# Patient Record
Sex: Female | Born: 1955 | Race: White | Hispanic: No | Smoking: Former smoker
Health system: Southern US, Community
[De-identification: ages and names within clinical notes are randomized; demographics above are authoritative.]

## PROBLEM LIST (undated history)

## (undated) DIAGNOSIS — S2232XA Fracture of one rib, left side, initial encounter for closed fracture: Secondary | ICD-10-CM

## (undated) DIAGNOSIS — F410 Panic disorder [episodic paroxysmal anxiety] without agoraphobia: Secondary | ICD-10-CM

## (undated) DIAGNOSIS — G2581 Restless legs syndrome: Secondary | ICD-10-CM

## (undated) DIAGNOSIS — G4700 Insomnia, unspecified: Secondary | ICD-10-CM

## (undated) DIAGNOSIS — E538 Deficiency of other specified B group vitamins: Secondary | ICD-10-CM

## (undated) DIAGNOSIS — R55 Syncope and collapse: Secondary | ICD-10-CM

## (undated) DIAGNOSIS — M8580 Other specified disorders of bone density and structure, unspecified site: Secondary | ICD-10-CM

## (undated) DIAGNOSIS — F329 Major depressive disorder, single episode, unspecified: Secondary | ICD-10-CM

## (undated) DIAGNOSIS — R5382 Chronic fatigue, unspecified: Secondary | ICD-10-CM

## (undated) DIAGNOSIS — E782 Mixed hyperlipidemia: Secondary | ICD-10-CM

## (undated) DIAGNOSIS — M1990 Unspecified osteoarthritis, unspecified site: Secondary | ICD-10-CM

## (undated) DIAGNOSIS — F319 Bipolar disorder, unspecified: Secondary | ICD-10-CM

## (undated) DIAGNOSIS — Z9884 Bariatric surgery status: Secondary | ICD-10-CM

## (undated) DIAGNOSIS — K7460 Unspecified cirrhosis of liver: Secondary | ICD-10-CM

## (undated) DIAGNOSIS — F32A Depression, unspecified: Secondary | ICD-10-CM

## (undated) DIAGNOSIS — M5412 Radiculopathy, cervical region: Secondary | ICD-10-CM

## (undated) DIAGNOSIS — R928 Other abnormal and inconclusive findings on diagnostic imaging of breast: Secondary | ICD-10-CM

## (undated) DIAGNOSIS — N182 Chronic kidney disease, stage 2 (mild): Secondary | ICD-10-CM

## (undated) DIAGNOSIS — G894 Chronic pain syndrome: Secondary | ICD-10-CM

## (undated) DIAGNOSIS — K760 Fatty (change of) liver, not elsewhere classified: Secondary | ICD-10-CM

## (undated) DIAGNOSIS — Z1211 Encounter for screening for malignant neoplasm of colon: Secondary | ICD-10-CM

## (undated) DIAGNOSIS — Z972 Presence of dental prosthetic device (complete) (partial): Secondary | ICD-10-CM

## (undated) DIAGNOSIS — E669 Obesity, unspecified: Secondary | ICD-10-CM

## (undated) DIAGNOSIS — N200 Calculus of kidney: Secondary | ICD-10-CM

## (undated) DIAGNOSIS — R110 Nausea: Secondary | ICD-10-CM

## (undated) DIAGNOSIS — M797 Fibromyalgia: Secondary | ICD-10-CM

## (undated) DIAGNOSIS — D509 Iron deficiency anemia, unspecified: Secondary | ICD-10-CM

## (undated) DIAGNOSIS — Z87442 Personal history of urinary calculi: Secondary | ICD-10-CM

## (undated) DIAGNOSIS — F17200 Nicotine dependence, unspecified, uncomplicated: Secondary | ICD-10-CM

## (undated) DIAGNOSIS — R932 Abnormal findings on diagnostic imaging of liver and biliary tract: Secondary | ICD-10-CM

## (undated) HISTORY — PX: ABDOMINAL HYSTERECTOMY: SHX81

## (undated) HISTORY — DX: Fatty (change of) liver, not elsewhere classified: K76.0

## (undated) HISTORY — DX: Deficiency of other specified B group vitamins: E53.8

## (undated) HISTORY — DX: Panic disorder (episodic paroxysmal anxiety): F41.0

## (undated) HISTORY — DX: Nausea: R11.0

## (undated) HISTORY — DX: Bipolar disorder, unspecified: F31.9

## (undated) HISTORY — DX: Chronic fatigue, unspecified: R53.82

## (undated) HISTORY — DX: Calculus of kidney: N20.0

## (undated) HISTORY — DX: Chronic kidney disease, stage 2 (mild): N18.2

## (undated) HISTORY — DX: Nicotine dependence, unspecified, uncomplicated: F17.200

## (undated) HISTORY — PX: BACK SURGERY: SHX140

## (undated) HISTORY — DX: Insomnia, unspecified: G47.00

## (undated) HISTORY — DX: Iron deficiency anemia, unspecified: D50.9

## (undated) HISTORY — PX: TONSILLECTOMY: SUR1361

## (undated) HISTORY — PX: APPENDECTOMY: SHX54

## (undated) HISTORY — PX: CERVICAL SPINE SURGERY: SHX589

## (undated) HISTORY — PX: ROUX-EN-Y GASTRIC BYPASS: SHX1104

## (undated) HISTORY — DX: Radiculopathy, cervical region: M54.12

## (undated) HISTORY — DX: Encounter for screening for malignant neoplasm of colon: Z12.11

## (undated) HISTORY — DX: Chronic pain syndrome: G89.4

## (undated) HISTORY — DX: Other specified disorders of bone density and structure, unspecified site: M85.80

## (undated) HISTORY — PX: BLADDER SURGERY: SHX569

## (undated) HISTORY — DX: Mixed hyperlipidemia: E78.2

## (undated) HISTORY — DX: Obesity, unspecified: E66.9

## (undated) HISTORY — DX: Restless legs syndrome: G25.81

---
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
## 2003-02-12 DIAGNOSIS — K760 Fatty (change of) liver, not elsewhere classified: Secondary | ICD-10-CM

## 2003-02-12 HISTORY — DX: Fatty (change of) liver, not elsewhere classified: K76.0

## 2003-02-12 HISTORY — PX: HERNIA REPAIR: SHX51

## 2003-05-30 ENCOUNTER — Ambulatory Visit (HOSPITAL_COMMUNITY): Admission: RE | Admit: 2003-05-30 | Discharge: 2003-05-30 | Payer: Self-pay | Admitting: Family Medicine

## 2003-06-01 ENCOUNTER — Inpatient Hospital Stay (HOSPITAL_COMMUNITY): Admission: EM | Admit: 2003-06-01 | Discharge: 2003-06-08 | Payer: Self-pay | Admitting: Emergency Medicine

## 2003-06-01 ENCOUNTER — Encounter: Payer: Self-pay | Admitting: Family Medicine

## 2003-06-08 ENCOUNTER — Encounter: Payer: Self-pay | Admitting: Family Medicine

## 2007-05-19 ENCOUNTER — Ambulatory Visit (HOSPITAL_COMMUNITY): Admission: RE | Admit: 2007-05-19 | Discharge: 2007-05-19 | Payer: Self-pay | Admitting: Family Medicine

## 2009-07-19 ENCOUNTER — Encounter: Payer: Self-pay | Admitting: Family Medicine

## 2009-09-11 ENCOUNTER — Encounter
Admission: RE | Admit: 2009-09-11 | Discharge: 2009-11-09 | Payer: Self-pay | Source: Home / Self Care | Admitting: Family Medicine

## 2009-09-11 ENCOUNTER — Encounter: Payer: Self-pay | Admitting: Family Medicine

## 2009-09-11 DIAGNOSIS — D509 Iron deficiency anemia, unspecified: Secondary | ICD-10-CM

## 2009-09-11 HISTORY — DX: Iron deficiency anemia, unspecified: D50.9

## 2009-09-12 ENCOUNTER — Encounter: Payer: Self-pay | Admitting: Family Medicine

## 2009-09-26 ENCOUNTER — Encounter: Payer: Self-pay | Admitting: Family Medicine

## 2009-10-20 ENCOUNTER — Encounter: Payer: Self-pay | Admitting: Family Medicine

## 2010-01-23 ENCOUNTER — Telehealth: Payer: Self-pay | Admitting: Family Medicine

## 2010-01-23 ENCOUNTER — Ambulatory Visit: Payer: Self-pay | Admitting: Family Medicine

## 2010-01-23 DIAGNOSIS — E1165 Type 2 diabetes mellitus with hyperglycemia: Secondary | ICD-10-CM

## 2010-01-23 DIAGNOSIS — G2581 Restless legs syndrome: Secondary | ICD-10-CM | POA: Insufficient documentation

## 2010-01-23 DIAGNOSIS — IMO0002 Reserved for concepts with insufficient information to code with codable children: Secondary | ICD-10-CM | POA: Insufficient documentation

## 2010-01-23 DIAGNOSIS — F3181 Bipolar II disorder: Secondary | ICD-10-CM | POA: Insufficient documentation

## 2010-01-23 DIAGNOSIS — D509 Iron deficiency anemia, unspecified: Secondary | ICD-10-CM | POA: Insufficient documentation

## 2010-01-23 DIAGNOSIS — R5382 Chronic fatigue, unspecified: Secondary | ICD-10-CM | POA: Insufficient documentation

## 2010-01-23 DIAGNOSIS — F411 Generalized anxiety disorder: Secondary | ICD-10-CM | POA: Insufficient documentation

## 2010-01-23 DIAGNOSIS — E559 Vitamin D deficiency, unspecified: Secondary | ICD-10-CM | POA: Insufficient documentation

## 2010-01-24 ENCOUNTER — Encounter: Payer: Self-pay | Admitting: Family Medicine

## 2010-01-30 ENCOUNTER — Encounter: Payer: Self-pay | Admitting: Family Medicine

## 2010-01-30 ENCOUNTER — Ambulatory Visit (HOSPITAL_COMMUNITY)
Admission: RE | Admit: 2010-01-30 | Discharge: 2010-01-30 | Payer: Self-pay | Source: Home / Self Care | Attending: Family Medicine | Admitting: Family Medicine

## 2010-01-31 ENCOUNTER — Encounter: Payer: Self-pay | Admitting: Family Medicine

## 2010-01-31 LAB — CONVERTED CEMR LAB
ALT: 21 units/L (ref 0–35)
AST: 23 units/L (ref 0–37)
Alkaline Phosphatase: 65 units/L (ref 39–117)
Basophils Absolute: 0 10*3/uL (ref 0.0–0.1)
Basophils Relative: 0 % (ref 0–1)
CO2: 25 meq/L (ref 19–32)
Calcium: 9.6 mg/dL (ref 8.4–10.5)
Chloride: 105 meq/L (ref 96–112)
Creatinine, Ser: 0.81 mg/dL (ref 0.40–1.20)
Glucose, Bld: 145 mg/dL — ABNORMAL HIGH (ref 70–99)
Lymphs Abs: 3.1 10*3/uL (ref 0.7–4.0)
MCHC: 32.1 g/dL (ref 30.0–36.0)
Monocytes Absolute: 0.4 10*3/uL (ref 0.1–1.0)
Neutro Abs: 3 10*3/uL (ref 1.7–7.7)
Neutrophils Relative %: 44 % (ref 43–77)
Platelets: 278 10*3/uL (ref 150–400)
RDW: 13.7 % (ref 11.5–15.5)
TSH: 3.409 microintl units/mL (ref 0.350–4.500)
Total Bilirubin: 0.3 mg/dL (ref 0.3–1.2)
Total CHOL/HDL Ratio: 7.3
Triglycerides: 297 mg/dL — ABNORMAL HIGH (ref <150)

## 2010-02-07 ENCOUNTER — Encounter: Payer: Self-pay | Admitting: Family Medicine

## 2010-02-14 ENCOUNTER — Encounter: Payer: Self-pay | Admitting: Family Medicine

## 2010-02-14 ENCOUNTER — Ambulatory Visit (HOSPITAL_COMMUNITY)
Admission: RE | Admit: 2010-02-14 | Discharge: 2010-02-14 | Payer: Self-pay | Source: Home / Self Care | Attending: Family Medicine | Admitting: Family Medicine

## 2010-02-27 ENCOUNTER — Ambulatory Visit
Admission: RE | Admit: 2010-02-27 | Discharge: 2010-02-27 | Payer: Self-pay | Source: Home / Self Care | Attending: Family Medicine | Admitting: Family Medicine

## 2010-02-27 DIAGNOSIS — F3289 Other specified depressive episodes: Secondary | ICD-10-CM | POA: Insufficient documentation

## 2010-02-27 DIAGNOSIS — E782 Mixed hyperlipidemia: Secondary | ICD-10-CM | POA: Insufficient documentation

## 2010-02-27 DIAGNOSIS — F329 Major depressive disorder, single episode, unspecified: Secondary | ICD-10-CM | POA: Insufficient documentation

## 2010-02-27 DIAGNOSIS — E785 Hyperlipidemia, unspecified: Secondary | ICD-10-CM | POA: Insufficient documentation

## 2010-03-15 NOTE — Letter (Signed)
Summary: [facility] records  [facility] records   Imported By: Bubba Hales 02/01/2010 10:21:27  _____________________________________________________________________  External Attachment:    Type:   Image     Comment:   External Document

## 2010-03-15 NOTE — Medication Information (Signed)
Summary: Prior Auth and APPROVAl/[employer]  Prior Auth and APPROVAl/[employer]   Imported By: Bubba Hales 02/13/2010 07:50:03  _____________________________________________________________________  External Attachment:    Type:   Image     Comment:   External Document

## 2010-03-15 NOTE — Miscellaneous (Signed)
  Clinical Lists Changes  Medications: Added new medication of * BYETTA PEN NEEDLES: 31 GAUGE, 5/16" 1 injection bid - Signed Rx of BYETTA PEN NEEDLES: 31 GAUGE, 5/16" 1 injection bid;  #60 x 6;  Signed;  Entered by: Kelle Darting M.D.;  Authorized by: Kelle Darting M.D.;  Method used: Print then Give to Patient    Prescriptions: BYETTA PEN NEEDLES: 31 GAUGE, 5/16" 1 injection bid  #60 x 6   Entered and Authorized by:   Kelle Darting M.D.   Signed by:   Kelle Darting M.D. on 02/07/2010   Method used:   Print then Give to Patient   RxID:   [id number]

## 2010-03-15 NOTE — Miscellaneous (Signed)
  Clinical Lists Changes  Problems: Added new problem of UNSPECIFIED VITAMIN D DEFICIENCY (ICD-268.9) Added new problem of UNSPECIFIED IRON DEFICIENCY ANEMIA (ICD-280.9) Medications: Changed medication from * VITAMIN D 1 weekly to * VITAMIN D 1 50,000 U tab weekly Observations: Added new observation of PAST MED HX: DM 2 Obesity; s/p bariatric surgery Bipolar disorder Restless legs syndrome Insomnia Anxiety with panic Chronic fatigue Iron deficiency anemia 09/2009--malabsorption s/p bariatric surgery Vitamin D def 09/2009 (01/23/2010 13:47) Added new observation of HPI: Reviewed some preliminary old records received after pt's appointment today.   Of note, we recently dx'd her with iron deficiency anemia secondary to malabsorption (gastric bypass surgery)--started iron sulfate 314m bid 09/2009.  Also, her vit D was low and we started vit D 50,000 U/week 09/15/09. Urine protein-creatinine ratio was normal 09/12/09.  She does have tobacco dependency. Will add ferritin and 25-OH vit D level to labs done today ([facility]).   (01/23/2010 13:47)      History of Present Illness: Reviewed some preliminary old records received after pt's appointment today.   Of note, we recently dx'd her with iron deficiency anemia secondary to malabsorption (gastric bypass surgery)--started iron sulfate 3239mbid 09/2009.  Also, her vit D was low and we started vit D 50,000 U/week 09/15/09. Urine protein-creatinine ratio was normal 09/12/09.  She does have tobacco dependency. Will add ferritin and 25-OH vit D level to labs done today ([facility]).     Allergies: 1)  ! Codeine 2)  ! Penicillin   Past History:  Past Medical History: DM 2 Obesity; s/p bariatric surgery Bipolar disorder Restless legs syndrome Insomnia Anxiety with panic Chronic fatigue Iron deficiency anemia 09/2009--malabsorption s/p bariatric surgery Vitamin D def 09/2009

## 2010-03-15 NOTE — Assessment & Plan Note (Signed)
Summary: diabetic/vfw   Vital Signs:  Patient profile:   55 year old female Height:      65 inches (165.10 cm) Weight:      169.75 pounds (77.16 kg) BMI:     28.35 O2 Sat:      96 % on Room air Temp:     98.1 degrees F (36.72 degrees C) oral Pulse rate:   97 / minute BP sitting:   125 / 79  (right arm) Cuff size:   regular  Vitals Entered By: Gardenia Phlegm RMA (January 23, 2010 10:24 AM)  O2 Flow:  Room air CC: Establish new patient/ CF Is Patient Diabetic? Yes   History of Present Illness: 55 y/o WF here for first visit.  I know her from my previous practice in [place], [place], but have no old records on her at this time.   Checks glucose: fasting qAM and at bedtime, also when she feels shaky/sweaty.  Lowest number 89, highest is mid 300s.  She unfortunately could not give me a report of her avg glucose reading. Eats BF, usually doesn't eat again until evening.  This has resulted in some low normal BS readings in mid-day, which caused symptoms of hypoglycemia and prompted her to stop using amaryl daily and started using it when she found her sugar to be elevated--avg of 4 days per week, 1/2 tab each time. Compliant with metformin daily.  Denies tingling, numnbess, pain, or any skin lesions on feet.  Complains alot still about constant fatigue, no motivation, anxious and depressed alot, mixed with some periods of racing thoughts and irritability.  Psych state is affecting her personal relationships, also keeps her from going out of the house much.  Displays agoraphobic symptoms. Also complains about a constant aching in her legs from thighs down to ankles, with the feeling that they sometimes have "knots" in them, although she cannot feel any such thing when palpating them.  No swelling, no injury to legs, no consistent back pain.  +Restless legs all day and when trying to go to sleep.  Daytime symptoms alleviated somewhat when walking.  Med list reviewed: the list below ("current  medications") should include amaryl and should not include byetta.   Current Medications (verified): 1)  Metformin Hcl 1000 Mg Tabs (Metformin Hcl) .... Two Times A Day 2)  Vitamin D .... 1 Weekly 3)  Tums Ultra 1000 1000 Mg Chew (Calcium Carbonate Antacid) .... 3 Daily 4)  Aspir-Low 81 Mg Tbec (Aspirin) .... Once Daily 5)  Iron 325 (65 Fe) Mg Tabs (Ferrous Sulfate) .... Once Daily 6)  Centrum Silver Ultra Womens  Tabs (Multiple Vitamins-Minerals) .... Once Daily 7)  Tramadol Hcl 50 Mg Tabs (Tramadol Hcl) .... 2 Tab Two Times A Day and At Bedtime As Directed 8)  Clonazepam 2 Mg Tabs (Clonazepam) .Marland Kitchen.. 1 Tab By Mouth Three Times A Day 9)  Lamotrigine 200 Mg Tabs (Lamotrigine) .... 1/2 Tab By Mouth Two Times A Day 10)  Remeron 30 Mg Tabs (Mirtazapine) .Marland Kitchen.. 1 Tab By Mouth At Bedtime 11)  Byetta 5 Mcg Pen 5 Mcg/0.31m Soln (Exenatide) .... 5 Micrograms Subcutaneously 30 Min Prior To Morning and Evening Meals  Allergies (verified): 1)  ! Codeine 2)  ! Penicillin  Past History:  Past Medical History: DM 2 Obesity, s/p bariatric surgery Bipolar disorder Restless legs syndrome Insomnia Anxiety with panic Chronic fatigue  Social History: Single, one son. Disabled due to psych reasons No T/A/Ds.  Review of Systems  The patient complains of abdominal pain.  The patient denies fever, weight loss, vision loss, chest pain, hemoptysis, hematuria, abnormal bleeding, and breast masses.    Physical Exam  General:  All VS normal.   Well-developed,well-nourished,in no acute distress; alert,appropriate and cooperative throughout examination.  No further exam today.   Impression & Recommendations:  Problem # 1:  RESTLESS LEG SYNDROME (ICD-333.94) Assessment Unchanged Continue current med, need to review my old records on her so we'll get these ASAP.  Orders: T-Comprehensive Metabolic Panel ([id number]) T-Lipid Profile [phone]) T-CBC w/Diff [phone]) T-Epstein-Barr  virus, early antigen ([id number]) T-TSH [phone]) T- Hemoglobin A1C (32355-73220)  Problem # 2:  DIAB W/O MENTION COMP TYPE II/UNS TYPE UNCNTRL (ICD-250.02) Assessment: Deteriorated D/c amaryl and start byetta 5 micrograms two times a day.  Therapeutic expectations and side effect profile discussed, questions answered. Keep glucose log and bring it to return visit in 1 mo.  Will review my old records to see if urine protein/creatinine done in the last year---I think it has been.  If not, will check at f/u visit in 1 mo.  Her updated medication list for this problem includes:    Metformin Hcl 1000 Mg Tabs (Metformin hcl) .Marland Kitchen..Marland Kitchen Two times a day    Aspir-low 81 Mg Tbec (Aspirin) ..... Once daily    Byetta 5 Mcg Pen 5 Mcg/0.70m Soln (Exenatide) ..Marland KitchenMarland KitchenMarland KitchenMarland Kitchen5 micrograms subcutaneously 30 min prior to morning and evening meals  Problem # 3:  FAMILY HISTORY OF MALIGNANT NEOPLASM OF BREAST (ICD-V16.3) Assessment: Comment Only Pt had last mammo about 2-3 years ago per her report.  [facility]  She reports that her sister had breast cancer. Orders: Radiology Referral (Radiology)  Problem # 4:  FATIGUE (ICD-780.79) Assessment: Unchanged Will review past w/u in my old records, then pursue appropriate diagnostics if needed.   Likely, this is largely related to her depression and anxiety state.  Complete Medication List: 1)  Metformin Hcl 1000 Mg Tabs (Metformin hcl) .... Two times a day 2)  Vitamin D  ....Marland Kitchen1 weekly 3)  Tums Ultra 1000 1000 Mg Chew (Calcium carbonate antacid) .... 3 daily 4)  Aspir-low 81 Mg Tbec (Aspirin) .... Once daily 5)  Iron 325 (65 Fe) Mg Tabs (Ferrous sulfate) .... Once daily 6)  Centrum Silver Ultra Womens Tabs (Multiple vitamins-minerals) .... Once daily 7)  Tramadol Hcl 50 Mg Tabs (Tramadol hcl) .... 2 tab two times a day and at bedtime as directed 8)  Clonazepam 2 Mg Tabs (Clonazepam) ..Marland Kitchen. 1 tab by mouth three times a day 9)   Lamotrigine 200 Mg Tabs (Lamotrigine) .... 1/2 tab by mouth two times a day 10)  Remeron 30 Mg Tabs (Mirtazapine) ..Marland Kitchen. 1 tab by mouth at bedtime 11)  Byetta 5 Mcg Pen 5 Mcg/0.034mSoln (Exenatide) .... 5 micrograms subcutaneously 30 min prior to morning and evening meals  Patient Instructions: 1)  [facility] [place] [place]  [facility]  F/u appt in 1 month: bring glucose log book in with you. Prescriptions: BYETTA 5 MCG PEN 5 MCG/0.02ML SOLN (EXENATIDE) 5 micrograms Subcutaneously 30 min prior to morning and evening meals  #1 mo supply x 1   Entered and Authorized by:   PhKelle Darting.D.   Signed by:   PhKelle Darting.D. on 01/23/2010   Method used:   Print then Give to Patient  RxID:   [id number]    Orders Added: 1)  T-Comprehensive Metabolic Panel [[id number]] 2)  T-Lipid Profile [[id number]] 3)  T-CBC w/Diff [[id number]] 4)  T-Epstein-Barr virus, early antigen [[id number]] 5)  T-TSH [[id number]] 6)  T- Hemoglobin A1C [83036-23375] 7)  Radiology Referral [Radiology] 8)  Est. Patient Level III [79980]    Preventive Care Screening  Mammogram:    Date:  02/12/2007    Results:  historical   Pap Smear:    Date:  02/11/2006    Results:  historical   Last Tetanus Booster:    Date:  02/12/2003    Results:  Historical

## 2010-03-15 NOTE — Progress Notes (Signed)
Summary: Medical Record Request  Phone Note Outgoing Call   Call placed by: Titus Dubin,  January 23, 2010 1:27 PM Summary of Call: Faxed medical record request to [facility] [phone] Initial call taken by: Titus Dubin,  January 23, 2010 1:27 PM

## 2010-03-15 NOTE — Letter (Signed)
Summary: [facility]   Imported By: Bubba Hales 02/01/2010 10:22:37  _____________________________________________________________________  External Attachment:    Type:   Image     Comment:   External Document

## 2010-03-15 NOTE — Letter (Signed)
Summary: [facility]   Imported By: Bubba Hales 01/29/2010 11:17:30  _____________________________________________________________________  External Attachment:    Type:   Image     Comment:   External Document

## 2010-03-15 NOTE — Miscellaneous (Signed)
  Clinical Lists Changes  Medications: Added new medication of LIPITOR 10 MG TABS (ATORVASTATIN CALCIUM) 1 tab by mouth once daily - Signed Rx of LIPITOR 10 MG TABS (ATORVASTATIN CALCIUM) 1 tab by mouth once daily;  #30 x 1;  Signed;  Entered by: Kelle Darting M.D.;  Authorized by: Kelle Darting M.D.;  Method used: Electronically to [facility] [address] [place], [place], [place]  [address], Ph: [phone], Fax: [phone]    Prescriptions: LIPITOR 10 MG TABS (ATORVASTATIN CALCIUM) 1 tab by mouth once daily  #30 x 1   Entered and Authorized by:   Kelle Darting M.D.   Signed by:   Kelle Darting M.D. on 01/31/2010   Method used:   Electronically to        [facility] (retail)       [address] [place] [address]       [place], [place]  [address]       Ph: [phone]       Fax: [phone]   RxID:   [id number]   Prior Medications: METFORMIN HCL 1000 MG TABS (METFORMIN HCL) two times a day VITAMIN D () 1 50,000 U tab weekly TUMS ULTRA 1000 1000 MG CHEW (CALCIUM CARBONATE ANTACID) 3 daily ASPIR-LOW 81 MG TBEC (ASPIRIN) once daily IRON 325 (65 FE) MG TABS (FERROUS SULFATE) once daily CENTRUM SILVER ULTRA WOMENS  TABS (MULTIPLE VITAMINS-MINERALS) once daily TRAMADOL HCL 50 MG TABS (TRAMADOL HCL) 2 tab two times a day and at bedtime as directed CLONAZEPAM 2 MG TABS (CLONAZEPAM) 1 tab by mouth three times a day LAMOTRIGINE 200 MG TABS (LAMOTRIGINE) 1/2 tab by mouth two times a day REMERON 30 MG TABS (MIRTAZAPINE) 1 tab by mouth at bedtime BYETTA 5 MCG PEN 5 MCG/0.02ML SOLN (EXENATIDE) 5 micrograms Subcutaneously 30 min prior to morning and evening meals Current Allergies: ! CODEINE ! PENICILLIN

## 2010-03-15 NOTE — Miscellaneous (Signed)
  Clinical Lists Changes  Observations: Added new observation of PAST MED HX: DM 2 (urine protein-Cr ratio nl 09/2009, D.R. screen neg 10/2009) Obesity; s/p bariatric surgery Bipolar disorder Restless legs syndrome Insomnia Anxiety with panic Chronic fatigue Iron deficiency anemia 09/2009--malabsorption s/p bariatric surgery Vitamin D def 09/2009 Nephrolithiasis Mammogram 02/2010 NORMAL (02/14/2010 11:20)       Past History:  Past Medical History: DM 2 (urine protein-Cr ratio nl 09/2009, D.R. screen neg 10/2009) Obesity; s/p bariatric surgery Bipolar disorder Restless legs syndrome Insomnia Anxiety with panic Chronic fatigue Iron deficiency anemia 09/2009--malabsorption s/p bariatric surgery Vitamin D def 09/2009 Nephrolithiasis Mammogram 02/2010 NORMAL

## 2010-03-15 NOTE — Letter (Signed)
Summary: [facility]   Imported By: Bubba Hales 02/01/2010 10:24:57  _____________________________________________________________________  External Attachment:    Type:   Image     Comment:   External Document
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
## 2010-03-15 NOTE — Assessment & Plan Note (Signed)
Summary: 1 month fu/dt rsch per pt/dt   Vital Signs:  Patient profile:   55 year old female Height:      65 inches Weight:      166.75 pounds Temp:     98.3 degrees F oral Pulse rate:   92 / minute Pulse rhythm:   regular Resp:     20 per minute BP supine:   117 / 88  (right arm)  History of Present Illness:  Type 2 diabetes mellitus follow-up      This is a 55 year old woman who presents with Type 2 diabetes mellitus follow-up.  Started Byetta 5 mcg bid about 2 wks ago.  Notes moderate nausea from this but not daily, and no vomiting.  Glucoses avg 150 or so, checking bid.   The patient denies polyuria, polydipsia, blurred vision, self managed hypoglycemia, hypoglycemia requiring help, weight loss, weight gain, and numbness of extremities.    Also complains of ongoing fatigue, definitely agrees it is related to chronically depressed mood, anhedonia, lack of motivation, poor concentration, poor sleep.  No alcohol or drug use.  Asks for remeron to be increased to 24m b/c this had helped initiate/maintain sleep better than 333m Says she's been on "many" antidepressants in the past.  When asked about wellbutrin she says she was on it but cannot recall any specific reason why it was d/c'd.  She reiterates that the lamictal she is on has not helped at all with her mood. She expresses an aversion to counseling/therapy, mainly based on experiences with practitioners that were cold/impersonal/not helpful.  Med list reviewed: it should include byetta 5 micrograms two times a day as a current med. Also, lipitor was recently started for hyperlipidemia and she is tolerating this fine.  Allergies: 1)  ! Codeine 2)  ! Penicillin  Review of Systems       no rash, no myalgias, no ST or URI symptoms, no cough, no melena/hematochezia.  Physical Exam  General:  VS: noted, all normal. Gen: Alert, well appearing, oriented x 4. Affect: pleasant.  Lucid thought and speech.  Movement was normal.  No  further exam today.   Impression & Recommendations:  Problem # 1:  DIAB W/O MENTION COMP TYPE II/UNS TYPE UNCNTRL (ICD-250.02) Assessment Improved Continue byetta at 36m36mdose for another 2 wks, and if nausea is resolved she'll go up to 10 micrograms two times a day dosing.  If not, she'll stay on 36mc336mwo times a day or we'll try alternative therapy. Will likely need to add actos in near future to get her A1c to goal.  Her updated medication list for this problem includes:    Metformin Hcl 1000 Mg Tabs (Metformin hcl) .....Marland KitchenTwMarland Kitchen times a day    Aspir-low 81 Mg Tbec (Aspirin) ..... Once daily    byetta 36mcg26mo times a day   Problem # 2:  DEPRESSION, RECURRENT (ICD-311) Assessment: Unchanged Add wellbutrin XL 150mg 54m increase remeron to 436mg a31mdtime.  Encouraged pt to reconsider starting counseling/therapy again, and I gave her a pamphlet of [facility].  Also told her that any other [facility] [place] [facility] [place] choice would be fine.  She is reluctant but will consider it. Pt not suicidal or homicidal.  Her updated medication list for this problem includes:    Clonazepam 2 Mg Tabs (Clonazepam) ..... 1 Marland Kitchenab by mouth three times a day    Remeron 45 Mg Tabs (Mirtazapine) ..... 1 Marland Kitchenab by mouth qhs  Wellbutrin Xl 150 Mg Xr24h-tab (Bupropion hcl) .Marland Kitchen... 1 tab by mouth qd  Problem # 3:  FATIGUE (ICD-780.79) Assessment: Unchanged Related to #2.  Reviewed recent labs: normal CBC, CMET, TSH.  Problem # 4:  HYPERLIPIDEMIA (PFX-902.4) Assessment: Comment Only Continue lipitor.  We'll recheck lipids, AST/ALT after next f/u in 1 mo. Her updated medication list for this problem includes:    Lipitor 10 Mg Tabs (Atorvastatin calcium) .Marland Kitchen... 1 tab by mouth once daily  Complete Medication List: 1)  Metformin Hcl 1000 Mg Tabs (Metformin hcl) .... Two times a day 2)  Vitamin D  .... 1 50,000 u tab weekly 3)  Tums Ultra 1000 1000 Mg Chew (Calcium carbonate antacid) .... 3 daily 4)   Aspir-low 81 Mg Tbec (Aspirin) .... Once daily 5)  Iron 325 (65 Fe) Mg Tabs (Ferrous sulfate) .... Once daily 6)  Centrum Silver Ultra Womens Tabs (Multiple vitamins-minerals) .... Once daily 7)  Tramadol Hcl 50 Mg Tabs (Tramadol hcl) .... 2 tab two times a day and at bedtime as directed 8)  Clonazepam 2 Mg Tabs (Clonazepam) .Marland Kitchen.. 1 tab by mouth three times a day 9)  Lamotrigine 200 Mg Tabs (Lamotrigine) .... 1/2 tab by mouth two times a day 10)  Remeron 45 Mg Tabs (Mirtazapine) .Marland Kitchen.. 1 tab by mouth qhs 11)  Lipitor 10 Mg Tabs (Atorvastatin calcium) .Marland Kitchen.. 1 tab by mouth once daily 12)  Byetta Pen Needles: 31 Gauge, 5/16"  .Marland Kitchen.. 1 injection bid 13)  Wellbutrin Xl 150 Mg Xr24h-tab (Bupropion hcl) .Marland Kitchen.. 1 tab by mouth qd 14)  Byetta 10 Mcg Pen 10 Mcg/0.95m Soln (Exenatide) ..Marland Kitchen. 10 micrograms subcutaneously bid  Patient Instructions: 1)  Contunue to check your sugar twice daily. 2)  Continue byetta as is. 3)  Recheck in office in 14moPrescriptions: TRAMADOL HCL 50 MG TABS (TRAMADOL HCL) 2 tab two times a day and at bedtime as directed  #180 x 3   Entered and Authorized by:   PhKelle Darting.D.   Signed by:   PhKelle Darting.D. on 02/27/2010   Method used:   Print then Give to Patient   RxID:   [id number] 10 MCG PEN 10 MCG/0.04ML SOLN (EXENATIDE) 10 micrograms Subcutaneously bid  #60 x 5   Entered and Authorized by:   PhKelle Darting.D.   Signed by:   PhKelle Darting.D. on 02/27/2010   Method used:   Electronically to        [facility] (retail)       [address]     [place]  [address]     Ph: [phone]     Fax: [phone] RxID:   [id number] XL 150 MG XR24H-TAB (BUPROPION HCL) 1 tab by mouth qd  #30 x 1   Entered and Authorized by:   PhKelle Darting.D.   Signed by:   PhKelle Darting.D. on 02/27/2010   Method used:   Electronically to        [facility]       [address]     [place]  [address]     Ph: [phone]     Fax: [phone] RxID:   [id number] 45 MG TABS (MIRTAZAPINE) 1 tab by mouth qhs  #  30 x 5   Entered and Authorized by:   Kelle Darting M.D.   Signed by:   Kelle Darting M.D. on 02/27/2010   Method used:   Electronically to        [facility] (retail)       [address]       [place], [place]  [address]       Ph: [phone]       Fax: [phone]   RxID:   [id number] CLONAZEPAM 2 MG TABS (CLONAZEPAM) 1 tab by mouth three times a day  #30 x 5   Entered and Authorized by:   Kelle Darting M.D.   Signed by:   Kelle Darting M.D. on 02/27/2010   Method used:   Print then Give to Patient   RxID:   [id number]    Orders Added: 1)  Est. Patient Level III [74099]

## 2010-03-30 ENCOUNTER — Ambulatory Visit: Payer: Self-pay | Admitting: Family Medicine

## 2010-04-02 ENCOUNTER — Ambulatory Visit: Payer: Self-pay | Admitting: Family Medicine

## 2010-04-04 ENCOUNTER — Encounter: Payer: Self-pay | Admitting: Family Medicine

## 2010-04-04 ENCOUNTER — Ambulatory Visit (INDEPENDENT_AMBULATORY_CARE_PROVIDER_SITE_OTHER): Payer: Medicare PPO | Admitting: Family Medicine

## 2010-04-04 DIAGNOSIS — F329 Major depressive disorder, single episode, unspecified: Secondary | ICD-10-CM

## 2010-04-04 DIAGNOSIS — IMO0001 Reserved for inherently not codable concepts without codable children: Secondary | ICD-10-CM

## 2010-04-04 DIAGNOSIS — R110 Nausea: Secondary | ICD-10-CM | POA: Insufficient documentation

## 2010-04-04 DIAGNOSIS — E538 Deficiency of other specified B group vitamins: Secondary | ICD-10-CM | POA: Insufficient documentation

## 2010-04-04 DIAGNOSIS — F3289 Other specified depressive episodes: Secondary | ICD-10-CM

## 2010-04-04 DIAGNOSIS — G2581 Restless legs syndrome: Secondary | ICD-10-CM

## 2010-04-05 ENCOUNTER — Telehealth: Payer: Self-pay | Admitting: Family Medicine

## 2010-04-05 ENCOUNTER — Encounter: Payer: Self-pay | Admitting: Family Medicine

## 2010-04-05 DIAGNOSIS — R109 Unspecified abdominal pain: Secondary | ICD-10-CM | POA: Insufficient documentation

## 2010-04-09 ENCOUNTER — Telehealth: Payer: Self-pay | Admitting: Family Medicine

## 2010-04-09 ENCOUNTER — Other Ambulatory Visit: Payer: Self-pay | Admitting: Family Medicine

## 2010-04-09 DIAGNOSIS — R110 Nausea: Secondary | ICD-10-CM

## 2010-04-10 ENCOUNTER — Encounter: Payer: Self-pay | Admitting: Family Medicine

## 2010-04-10 ENCOUNTER — Ambulatory Visit (HOSPITAL_COMMUNITY)
Admission: RE | Admit: 2010-04-10 | Discharge: 2010-04-10 | Disposition: A | Discharge status: Home / Self Care | Payer: Medicare PPO | Source: Ambulatory Visit | Attending: Family Medicine | Admitting: Family Medicine

## 2010-04-10 DIAGNOSIS — R110 Nausea: Secondary | ICD-10-CM

## 2010-04-10 DIAGNOSIS — Z9884 Bariatric surgery status: Secondary | ICD-10-CM | POA: Insufficient documentation

## 2010-04-10 DIAGNOSIS — E119 Type 2 diabetes mellitus without complications: Secondary | ICD-10-CM | POA: Insufficient documentation

## 2010-04-10 LAB — CONVERTED CEMR LAB
AST: 20 units/L (ref 0–37)
BUN: 11 mg/dL (ref 6–23)
Creatinine, Ser: 0.75 mg/dL (ref 0.40–1.20)
Glucose, Bld: 156 mg/dL — ABNORMAL HIGH (ref 70–99)
Hgb A1c MFr Bld: 6.6 % — ABNORMAL HIGH (ref <5.7)
LDL Cholesterol: 106 mg/dL — ABNORMAL HIGH (ref 0–99)
Potassium: 5 meq/L (ref 3.5–5.3)
Sodium: 141 meq/L (ref 135–145)
Total CHOL/HDL Ratio: 4
Total Protein: 8.3 g/dL (ref 6.0–8.3)
VLDL: 29 mg/dL (ref 0–40)
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
## 2010-04-10 NOTE — Miscellaneous (Signed)
  Clinical Lists Changes  Observations: Added new observation of PAST MED HX: DM 2 (urine protein-Cr ratio nl 09/2009, D.R. screen neg 10/2009) Obesity; s/p bariatric surgery Bipolar disorder (?) Restless legs syndrome Insomnia Anxiety with panic Chronic fatigue Iron deficiency anemia 09/2009--malabsorption s/p bariatric surgery Vitamin D def 09/2009 Vitamin B12 deficiency Nephrolithiasis Mammogram 02/2010 NORMAL (04/04/2010 10:18)       Past History:  Past Medical History: DM 2 (urine protein-Cr ratio nl 09/2009, D.R. screen neg 10/2009) Obesity; s/p bariatric surgery Bipolar disorder (?) Restless legs syndrome Insomnia Anxiety with panic Chronic fatigue Iron deficiency anemia 09/2009--malabsorption s/p bariatric surgery Vitamin D def 09/2009 Vitamin B12 deficiency Nephrolithiasis Mammogram 02/2010 NORMAL

## 2010-04-10 NOTE — Miscellaneous (Signed)
  Clinical Lists Changes  Observations: Added new observation of PAST MED HX: DM 2 (urine protein-Cr ratio nl 09/2009, D.R. screen neg 10/2009) Obesity; s/p bariatric surgery Hepatic steatosis (u/s 2005) Bipolar disorder (?) Restless legs syndrome Insomnia Anxiety with panic Chronic fatigue Iron deficiency anemia 09/2009--malabsorption s/p bariatric surgery Vitamin D def 09/2009 Vitamin B12 deficiency Nephrolithiasis Mammogram 02/2010 NORMAL (04/05/2010 9:04) Added new observation of PAST SURG HX: Roux-en-Y gastric bypass Tonsillectomy Hysterectomy for fibroids--ovaries still in. Bladder tack Appendectomy Lysis of adhesions,with mesenteric hernia repair 2005 (presented with partial SBO). (04/05/2010 9:04) Added new observation of PRIMARY MD: Kelle Darting M.D. (04/05/2010 9:04)       Past History:  Past Medical History: DM 2 (urine protein-Cr ratio nl 09/2009, D.R. screen neg 10/2009) Obesity; s/p bariatric surgery Hepatic steatosis (u/s 2005) Bipolar disorder (?) Restless legs syndrome Insomnia Anxiety with panic Chronic fatigue Iron deficiency anemia 09/2009--malabsorption s/p bariatric surgery Vitamin D def 09/2009 Vitamin B12 deficiency Nephrolithiasis Mammogram 02/2010 NORMAL  Past Surgical History: Roux-en-Y gastric bypass Tonsillectomy Hysterectomy for fibroids--ovaries still in. Bladder tack Appendectomy Lysis of adhesions,with mesenteric hernia repair 2005 (presented with partial SBO).

## 2010-04-11 ENCOUNTER — Telehealth: Payer: Self-pay | Admitting: Family Medicine

## 2010-04-12 ENCOUNTER — Other Ambulatory Visit: Payer: Self-pay | Admitting: Family Medicine

## 2010-04-12 DIAGNOSIS — R110 Nausea: Secondary | ICD-10-CM

## 2010-04-12 HISTORY — PX: CHOLECYSTECTOMY: SHX55

## 2010-04-13 ENCOUNTER — Telehealth: Payer: Self-pay | Admitting: Family Medicine

## 2010-04-16 ENCOUNTER — Ambulatory Visit (HOSPITAL_COMMUNITY)
Admission: RE | Admit: 2010-04-16 | Discharge: 2010-04-16 | Disposition: A | Discharge status: Home / Self Care | Payer: Medicare PPO | Source: Ambulatory Visit | Attending: Family Medicine | Admitting: Family Medicine

## 2010-04-16 ENCOUNTER — Encounter: Payer: Self-pay | Admitting: Family Medicine

## 2010-04-16 DIAGNOSIS — K828 Other specified diseases of gallbladder: Secondary | ICD-10-CM | POA: Insufficient documentation

## 2010-04-16 DIAGNOSIS — Z9884 Bariatric surgery status: Secondary | ICD-10-CM | POA: Insufficient documentation

## 2010-04-16 DIAGNOSIS — E119 Type 2 diabetes mellitus without complications: Secondary | ICD-10-CM | POA: Insufficient documentation

## 2010-04-16 DIAGNOSIS — R932 Abnormal findings on diagnostic imaging of liver and biliary tract: Secondary | ICD-10-CM | POA: Insufficient documentation

## 2010-04-16 DIAGNOSIS — R110 Nausea: Secondary | ICD-10-CM | POA: Insufficient documentation

## 2010-04-16 DIAGNOSIS — R1012 Left upper quadrant pain: Secondary | ICD-10-CM | POA: Insufficient documentation

## 2010-04-19 NOTE — Progress Notes (Signed)
Summary: Lamotrigine refill  Phone Note Refill Request Message from:  Fax from Pharmacy on April 09, 2010 9:42 AM  Refills Requested: Medication #1:  LAMOTRIGINE 200 MG TABS 1 tab by mouth qAM   Dosage confirmed as above?Dosage Confirmed Initial call taken by: Gardenia Phlegm RMA,  April 09, 2010 9:42 AM    Prescriptions: LAMOTRIGINE 200 MG TABS (LAMOTRIGINE) 1 tab by mouth qAM  #30 x 0   Entered by:   Gardenia Phlegm RMA   Authorized by:   Kelle Darting M.D.   Signed by:   Gardenia Phlegm RMA on 04/09/2010   Method used:   Electronically to        [facility].* (retail)       [address]       [place], [place]  [address]       Ph: [phone]       Fax: [phone]   RxID:   [id number]

## 2010-04-19 NOTE — Progress Notes (Signed)
Summary: Meds for Nausea  Phone Note Call from Patient   Summary of Call: Patient is going out of town this weekend and she needs something called into [facility] for nausea.  Initial call taken by: Bud Face,  April 13, 2010 12:38 PM  Follow-up for Phone Call        eRx sent to [facility] Follow-up by: Kelle Darting M.D.,  April 13, 2010 1:04 PM  Additional Follow-up for Phone Call Additional follow up Details #1::        pt notified. Additional Follow-up by: Abelino Derrick CMA Deborra Medina),  April 13, 2010 2:38 PM    New/Updated Medications: PROMETHAZINE HCL 12.5 MG TABS (PROMETHAZINE HCL) 1-2 tabs by mouth q6h as needed for nausea Prescriptions: PROMETHAZINE HCL 12.5 MG TABS (PROMETHAZINE HCL) 1-2 tabs by mouth q6h as needed for nausea  #60 x 2   Entered and Authorized by:   Kelle Darting M.D.   Signed by:   Kelle Darting M.D. on 04/13/2010   Method used:   Electronically to        [facility].* (retail)       [address]       [place], [place]  [address]       Ph: [phone]       Fax: [phone]   RxID:   [id number]

## 2010-04-19 NOTE — Progress Notes (Signed)
Summary: [facility]  Phone Note Other Incoming   Summary of Call: Pls notify pt: I reviewed her surgery records from 2005 so tell her not to worry about trying to get these for me.  Also, before I ask GI to see her I want her to get a test at [facility] de[facility]. to further evaluate her abdominal complaints.  Please arrange upper GI barium study with small bowel follow through.  Dx is chronic nausea, recurrent abd pain, history of gastric bypass surgery, hx of small bowel obstuction from adhesions and mesenteric hernia 2005.  Thx Initial call taken by: Kelle Darting M.D.,  April 05, 2010 9:11 AM  Follow-up for Phone Call        pt notified of above.  she is agreeable.  We will set up for her. Follow-up by: Abelino Derrick CMA Deborra Medina),  April 06, 2010 4:44 PM  Additional Follow-up for Phone Call Additional follow up Details #1::        Appt scheduled and pt notified.  Order faxed. Additional Follow-up by: Abelino Derrick CMA Deborra Medina),  April 09, 2010 2:37 PM  New Problems: ABDOMINAL PAIN, RECURRENT (ICD-789.00)   New Problems: ABDOMINAL PAIN, RECURRENT (ICD-789.00)

## 2010-04-19 NOTE — Progress Notes (Addendum)
Summary: lab results  Phone Note Other Incoming   Summary of Call: Pls notify: her labs came back excellent!  HbA1c down to 6.6%, and cholesterol numbers are into her goal ranges except for her "bad" cholesterol, which is 106 (goal of less than 100).  I want her to increase her lipitor to 55m daily (if she picked up the 127mtabs RF already then she can take 2 at a time until that bottle runs out and then start 2090mab once daily).  I'll eRx the 18m25mse today. Continue the plan of taking actos--the new diabetes pill I gave her samples of last visit--and metformin for her diabetes.   We'll recheck lipids in 2 months.  Pls notify: her recent barium study was normal.   Tell her that before we refer her to GI, I'd like to check a gallbladder ultrasound.  We can either do that as the next step (now), or wait until after her next o/v with me in a month and see if some of her GI symptoms improve spontaneously in the meantime.  Either approach is fine with me.  Let me know. Initial call taken by: PhilKelle Darting.,  April 11, 2010 8:26 AM  Follow-up for Phone Call        Pls call pt [phone] Lipitor Rx, pt wants to know why dosage went from 10 to 20 MG  Follow-up by: DianTitus Dubinarch  1, 2012 9:05 AM  Additional Follow-up for Phone Call Additional follow up Details #1::        I called pt and discussed recent lab and radiology results and discussed plan: will arrange GB u/s and pt will increase lipitor to 18mg1me daily. Additional Follow-up by: PhiliKelle Darting,  April 12, 2010 10:07 AM    New/Updated Medications: LIPITOR 20 MG TABS (ATORVASTATIN CALCIUM) 1 tab by mouth qd Prescriptions: LIPITOR 20 MG TABS (ATORVASTATIN CALCIUM) 1 tab by mouth qd  #30 x 6   Entered and Authorized by:   PhiliKelle Darting   Signed by:   PhiliKelle Darting on 04/11/2010   Method used:   Electronically to        [facility])       [address]  [place] [id number]  Ph: [phone]  Fax: [phone]:   [id number] Document: lab results Discussed results of u/s with pt today.  Due to her symptoms, the size of the GB polyp, and GB wall thickening, will refer to gen surgeon for possible cholecystectomy.

## 2010-04-24 ENCOUNTER — Other Ambulatory Visit: Payer: Self-pay | Admitting: General Surgery

## 2010-04-24 ENCOUNTER — Other Ambulatory Visit (HOSPITAL_COMMUNITY): Payer: Self-pay | Admitting: General Surgery

## 2010-04-24 DIAGNOSIS — K828 Other specified diseases of gallbladder: Secondary | ICD-10-CM

## 2010-04-24 NOTE — Op Note (Signed)
Summary: Small Bowel Obstruction-Dr. Aviva Signs  Small Bowel Obstruction-Dr. Aviva Signs   Imported By: Laural Benes 04/16/2010 13:35:02  _____________________________________________________________________  External Attachment:    Type:   Image     Comment:   External Document

## 2010-04-24 NOTE — Miscellaneous (Signed)
  Clinical Lists Changes  Problems: Added new problem of OTHER SPECIFIED DISORDER OF GALLBLADDER (ICD-575.8) Orders: Added new Referral order of Surgical Referral (Surgery) - Signed Observations: Added new observation of PRIMARY MD: Kelle Darting M.D. (04/16/2010 15:29)

## 2010-04-24 NOTE — Letter (Signed)
Summary: [facility]-Lysis [facility] Adhesions  [facility]   Imported By: Laural Benes 04/16/2010 13:39:56  _____________________________________________________________________  External Attachment:    Type:   Image     Comment:   External Document

## 2010-04-26 ENCOUNTER — Encounter (HOSPITAL_COMMUNITY): Payer: Self-pay

## 2010-04-26 ENCOUNTER — Telehealth (INDEPENDENT_AMBULATORY_CARE_PROVIDER_SITE_OTHER): Payer: Self-pay | Admitting: *Deleted

## 2010-04-26 ENCOUNTER — Other Ambulatory Visit: Payer: Medicare PPO

## 2010-04-26 ENCOUNTER — Ambulatory Visit (HOSPITAL_COMMUNITY)
Admission: RE | Admit: 2010-04-26 | Discharge: 2010-04-26 | Disposition: A | Discharge status: Home / Self Care | Payer: Medicare PPO | Source: Ambulatory Visit | Attending: General Surgery | Admitting: General Surgery

## 2010-04-26 DIAGNOSIS — R109 Unspecified abdominal pain: Secondary | ICD-10-CM | POA: Insufficient documentation

## 2010-04-26 DIAGNOSIS — K828 Other specified diseases of gallbladder: Secondary | ICD-10-CM

## 2010-04-26 DIAGNOSIS — R599 Enlarged lymph nodes, unspecified: Secondary | ICD-10-CM | POA: Insufficient documentation

## 2010-04-26 DIAGNOSIS — K7689 Other specified diseases of liver: Secondary | ICD-10-CM | POA: Insufficient documentation

## 2010-04-26 DIAGNOSIS — K829 Disease of gallbladder, unspecified: Secondary | ICD-10-CM | POA: Insufficient documentation

## 2010-04-26 DIAGNOSIS — Z9884 Bariatric surgery status: Secondary | ICD-10-CM | POA: Insufficient documentation

## 2010-04-26 MED ORDER — IOHEXOL 300 MG/ML  SOLN
100.0000 mL | Freq: Once | INTRAMUSCULAR | Status: AC | PRN
  Administered 2010-04-26: 100 mL via INTRAVENOUS

## 2010-04-27 ENCOUNTER — Encounter (HOSPITAL_COMMUNITY): Payer: Medicare PPO

## 2010-04-27 ENCOUNTER — Other Ambulatory Visit: Payer: Self-pay | Admitting: General Surgery

## 2010-04-27 ENCOUNTER — Ambulatory Visit (HOSPITAL_COMMUNITY)
Admission: RE | Admit: 2010-04-27 | Discharge: 2010-04-27 | Disposition: A | Discharge status: Home / Self Care | Payer: Medicare PPO | Source: Ambulatory Visit | Attending: General Surgery | Admitting: General Surgery

## 2010-04-27 ENCOUNTER — Other Ambulatory Visit (HOSPITAL_COMMUNITY): Payer: Self-pay | Admitting: General Surgery

## 2010-04-27 DIAGNOSIS — K829 Disease of gallbladder, unspecified: Secondary | ICD-10-CM | POA: Insufficient documentation

## 2010-04-27 DIAGNOSIS — K824 Cholesterolosis of gallbladder: Secondary | ICD-10-CM | POA: Insufficient documentation

## 2010-04-27 DIAGNOSIS — Z01810 Encounter for preprocedural cardiovascular examination: Secondary | ICD-10-CM | POA: Insufficient documentation

## 2010-04-27 DIAGNOSIS — Z01812 Encounter for preprocedural laboratory examination: Secondary | ICD-10-CM | POA: Insufficient documentation

## 2010-04-27 DIAGNOSIS — K828 Other specified diseases of gallbladder: Secondary | ICD-10-CM

## 2010-04-27 DIAGNOSIS — Z01818 Encounter for other preprocedural examination: Secondary | ICD-10-CM | POA: Insufficient documentation

## 2010-04-28 ENCOUNTER — Encounter: Payer: Self-pay | Admitting: Family Medicine

## 2010-05-02 ENCOUNTER — Inpatient Hospital Stay (HOSPITAL_COMMUNITY)
Admission: RE | Admit: 2010-05-02 | Discharge: 2010-05-06 | DRG: 416 | Disposition: A | Discharge status: Home / Self Care | Payer: Medicare PPO | Source: Ambulatory Visit | Attending: General Surgery | Admitting: General Surgery

## 2010-05-02 ENCOUNTER — Other Ambulatory Visit: Payer: Self-pay | Admitting: General Surgery

## 2010-05-02 DIAGNOSIS — K811 Chronic cholecystitis: Secondary | ICD-10-CM | POA: Diagnosis present

## 2010-05-02 DIAGNOSIS — K824 Cholesterolosis of gallbladder: Principal | ICD-10-CM | POA: Diagnosis present

## 2010-05-02 DIAGNOSIS — Z9884 Bariatric surgery status: Secondary | ICD-10-CM

## 2010-05-02 DIAGNOSIS — K660 Peritoneal adhesions (postprocedural) (postinfection): Secondary | ICD-10-CM | POA: Diagnosis present

## 2010-05-02 DIAGNOSIS — E119 Type 2 diabetes mellitus without complications: Secondary | ICD-10-CM | POA: Diagnosis present

## 2010-05-02 LAB — GLUCOSE, CAPILLARY
Glucose-Capillary: 211 mg/dL — ABNORMAL HIGH (ref 70–99)
Glucose-Capillary: 227 mg/dL — ABNORMAL HIGH (ref 70–99)
Glucose-Capillary: 315 mg/dL — ABNORMAL HIGH (ref 70–99)

## 2010-05-03 ENCOUNTER — Encounter: Payer: Self-pay | Admitting: Family Medicine

## 2010-05-03 ENCOUNTER — Ambulatory Visit: Payer: Medicare Other | Admitting: Family Medicine

## 2010-05-03 LAB — GLUCOSE, CAPILLARY
Glucose-Capillary: 205 mg/dL — ABNORMAL HIGH (ref 70–99)
Glucose-Capillary: 147 mg/dL — ABNORMAL HIGH (ref 70–99)
Glucose-Capillary: 243 mg/dL — ABNORMAL HIGH (ref 70–99)

## 2010-05-03 NOTE — Op Note (Signed)
NAME:  Diana [name], Diana [name]               ACCOUNT NO.:  [id number]  MEDICAL RECORD NO.:  [id number]           PATIENT TYPE:  I  LOCATION:  0009                         FACILITY:  [facility]  PHYSICIAN:  Dickey Gave, MDDATE OF BIRTH:  1955/11/29  DATE OF PROCEDURE: DATE OF DISCHARGE:                              OPERATIVE REPORT   PREOPERATIVE DIAGNOSIS:  Gallbladder mass/polyp.  POSTOPERATIVE DIAGNOSIS:  Gallbladder mass/polyp.  PROCEDURES: 1. Lysis of adhesions x30 minutes. 2. Open cholecystectomy.  SURGEON:  Mila Homer. Donne Hazel, M.D.  ASSISTANT:  Imogene Burn. Tsuei, M.D.  ANESTHESIA:  General.  SPECIMENS:  Gallbladder and contents to pathology.  ESTIMATED BLOOD LOSS:  Minimal.  COMPLICATIONS:  None.  DRAINS:  None.  DISPOSITION:  To recovery room in stable condition.  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  She had done okay since then.  She does have some symptoms likely referable to her bypass. She has not really been evaluated for it all over the long-term, and I offered her evaluation by the gastroenterologist as well as to see one of our bypass surgeons as well to evaluate this but this is tolerable to her and she was very concerned that she underwent an ultrasound that showed a 1 x 0.7 x 1 cm ovoid soft tissue density that appeared to be a polyp but there was a question of this could be gallbladder cancer.  She had no other real symptoms or any findings on CT scan of a gallbladder cancer, but she did not want to proceed with anything but a cholecystectomy due to her concern for cancer and her family history of multiple types of cancer.  I discussed with her laparoscopic cholecystectomy releasing attempt at one even given her prior surgeries, but after we discussed this, she was adamant that she refused to  undergo a laparoscopy and wished to just have an open cholecystectomy.  She was very concerned about having an injury from the lysis of adhesions with the laparoscopic, so we planned to do an open cholecystectomy.  I again discussed this with her the day of surgery to try to do this with a laparoscopic but she refused.  DESCRIPTION OF PROCEDURE:  After informed consent was obtained, the patient was taken to the operating room.  She was administered 1 g of cefoxitin.  Sequential compression devices were placed on her lower extremities prior to induction of anesthesia.  She was then placed under general endotracheal anesthesia without complication.  Her abdomen was prepped and draped in standard sterile surgical fashion.  Surgical time- out was then performed.  I made a right upper quadrant incision and dissected down to the rectus muscle.  This was divided.  There was one bleeding area in the rectus muscle that I suture ligated with a 2-0 silk suture.  I then entered the peritoneum sharply.  This was done without injury.  I then opened the peritoneum.  This required some lysis of adhesions towards this.  Her omentum was very adherent to her liver and her gallbladder was not able to be visualized at the beginning of the procedure, and I did about 30 minutes of lysis of adhesions, mostly from the omentum to the liver to attempt to identify the gallbladder.  I then placed a retractor in and was able to start taking down the gallbladder from the liver bed.  This was taken down in standard fashion.  There still were some adhesions present to some of the omentum down lower which were taken down.  There were also some significant adhesions to the duodenum which were taken down sharply, and there was no evidence of an injury during this portion of the procedure.  I noted a posterior branch of the artery which I oversewed with the 3-0 silk.  I eventually was able to come down to the triangle of  Calot.  Structures were identified in both the cystic duct and cystic artery.  The cystic artery I placed a clip distally and proximally and then placed a 2-0 Vicryl suture and tied this down as well.  I treated the duct in a similar fashion.  The gallbladder was then removed.  I did send this to Pathology to have them look at it grossly.  This appeared to be a soft polyp with no evidence at least grossly of any tumor.  She did have a little bit of an enlarged portacaval node on her scan but I thought with the appearance of this grossly as well as my palpating the area, that we would just stop at this point of the procedure.  I then irrigated the area copiously.  I obtained hemostasis.  I did place a piece of SURGICEL SNoW in her gallbladder bed.  I then closed her peritoneum with the 0 Vicryl.  The fascia was then closed the #1 looped PDS.  Irrigation was performed. Hemostasis was observed.  I then closed the skin with staples.  She tolerated this well and was extubated in the operating room and transferred to recovery in stable condition.     Dickey Gave, MD     MCW/MEDQ  D:  05/02/2010  T:  05/02/2010  Job:  [id number]  cc:   Tammi Sou, MD Fax: [phone]  Electronically Signed by Rolm Bookbinder MD on 05/03/2010 11:17:57 AM

## 2010-05-04 LAB — GLUCOSE, CAPILLARY
Glucose-Capillary: 160 mg/dL — ABNORMAL HIGH (ref 70–99)
Glucose-Capillary: 183 mg/dL — ABNORMAL HIGH (ref 70–99)

## 2010-05-05 LAB — BASIC METABOLIC PANEL
BUN: 9 mg/dL (ref 6–23)
Calcium: 8.8 mg/dL (ref 8.4–10.5)
Creatinine, Ser: 0.93 mg/dL (ref 0.4–1.2)
GFR calc non Af Amer: >60 mL/min (ref >60)
Glucose, Bld: 142 mg/dL — ABNORMAL HIGH (ref 70–99)
Sodium: 137 mEq/L (ref 135–145)

## 2010-05-05 LAB — CBC
HCT: 31.8 % — ABNORMAL LOW (ref 36.0–46.0)
MCH: 31.1 pg (ref 26.0–34.0)
MCHC: 31.4 g/dL (ref 30.0–36.0)
MCV: 98.8 fL (ref 78.0–100.0)
RDW: 13.3 % (ref 11.5–15.5)

## 2010-05-06 LAB — GLUCOSE, CAPILLARY: Glucose-Capillary: 149 mg/dL — ABNORMAL HIGH (ref 70–99)

## 2010-05-09 ENCOUNTER — Emergency Department (HOSPITAL_COMMUNITY)
Admission: EM | Admit: 2010-05-09 | Discharge: 2010-05-10 | Disposition: A | Discharge status: Home / Self Care | Payer: Medicare PPO | Attending: Emergency Medicine | Admitting: Emergency Medicine

## 2010-05-09 ENCOUNTER — Emergency Department (HOSPITAL_COMMUNITY): Payer: Medicare PPO

## 2010-05-09 DIAGNOSIS — K5900 Constipation, unspecified: Secondary | ICD-10-CM | POA: Insufficient documentation

## 2010-05-09 DIAGNOSIS — E119 Type 2 diabetes mellitus without complications: Secondary | ICD-10-CM | POA: Insufficient documentation

## 2010-05-09 DIAGNOSIS — Z79899 Other long term (current) drug therapy: Secondary | ICD-10-CM | POA: Insufficient documentation

## 2010-05-09 DIAGNOSIS — R110 Nausea: Secondary | ICD-10-CM | POA: Insufficient documentation

## 2010-05-09 DIAGNOSIS — R10811 Right upper quadrant abdominal tenderness: Secondary | ICD-10-CM | POA: Insufficient documentation

## 2010-05-09 DIAGNOSIS — R109 Unspecified abdominal pain: Secondary | ICD-10-CM | POA: Insufficient documentation

## 2010-05-09 DIAGNOSIS — R141 Gas pain: Secondary | ICD-10-CM | POA: Insufficient documentation

## 2010-05-09 DIAGNOSIS — R142 Eructation: Secondary | ICD-10-CM | POA: Insufficient documentation

## 2010-05-09 LAB — URINE MICROSCOPIC-ADD ON

## 2010-05-09 LAB — URINALYSIS, ROUTINE W REFLEX MICROSCOPIC
Bilirubin Urine: NEGATIVE
Glucose, UA: NEGATIVE mg/dL
Ketones, ur: NEGATIVE mg/dL
Protein, ur: NEGATIVE mg/dL

## 2010-05-09 LAB — COMPREHENSIVE METABOLIC PANEL
Albumin: 3.3 g/dL — ABNORMAL LOW (ref 3.5–5.2)
BUN: 7 mg/dL (ref 6–23)
Creatinine, Ser: 0.74 mg/dL (ref 0.4–1.2)
Total Bilirubin: 0.4 mg/dL (ref 0.3–1.2)
Total Protein: 7 g/dL (ref 6.0–8.3)

## 2010-05-09 LAB — GLUCOSE, CAPILLARY: Glucose-Capillary: 112 mg/dL — ABNORMAL HIGH (ref 70–99)

## 2010-05-09 LAB — LIPASE, BLOOD: Lipase: 19 U/L (ref 11–59)

## 2010-05-09 LAB — CBC
MCH: 31 pg (ref 26.0–34.0)
MCHC: 31.8 g/dL (ref 30.0–36.0)
MCV: 97.7 fL (ref 78.0–100.0)
Platelets: 312 10*3/uL (ref 150–400)
RDW: 13.8 % (ref 11.5–15.5)

## 2010-05-09 LAB — DIFFERENTIAL
Eosinophils Absolute: 0.5 10*3/uL (ref 0.0–0.7)
Eosinophils Relative: 6 % — ABNORMAL HIGH (ref 0–5)
Lymphs Abs: 2.4 10*3/uL (ref 0.7–4.0)
Monocytes Absolute: 0.3 10*3/uL (ref 0.1–1.0)
Monocytes Relative: 4 % (ref 3–12)

## 2010-05-09 LAB — POCT PREGNANCY, URINE: Preg Test, Ur: NEGATIVE

## 2010-05-10 MED ORDER — IOHEXOL 300 MG/ML  SOLN
100.0000 mL | Freq: Once | INTRAMUSCULAR | Status: AC | PRN
  Administered 2010-05-10: 80 mL via INTRAVENOUS

## 2010-05-10 NOTE — Progress Notes (Signed)
Summary: Patient question about UTI  Phone Note Call from Patient Call back at Home Phone [phone] Call back at [phone]   Caller: Patient Reason for Call: Talk to Nurse Summary of Call: Pt requesting Rx for a UTI and also has question about next B12 inj- pt will be home til 11:00 Initial call taken by: Titus Dubin,  April 26, 2010 9:53 AM  Follow-up for Phone Call        [facility] to pt.  Message left for pt to return call. Abelino Derrick CMA Deborra Medina)  April 26, 2010 3:57 PM   Pt did B12 injection this weekend.  Pt states UTI has cleared up and she no longer needs rx.  Pt still having GI issues, she is going to ask about having GI consult while she is inpatient at [facility].   Pt also needs refill for Pramipexole. Pt is taking 3 every night. Follow-up by: Abelino Derrick CMA Deborra Medina),  April 30, 2010 9:42 AM  Additional Follow-up for Phone Call Additional follow up Details #1::        Ok.  I did eRx for her pramipexole. I doubt they'll do an inpatient GI consult while she's in hosp for her GB surgery. We'll make referral to GI after she gets out of hosp. Additional Follow-up by: Kelle Darting M.D.,  April 30, 2010 9:54 AM    Additional Follow-up for Phone Call Additional follow up Details #2::    notified pt rx has been called in.  3/22 appt cancelled due to surgery. Follow-up by: Abelino Derrick CMA Deborra Medina),  April 30, 2010 10:47 AM  Prescriptions: PRAMIPEXOLE DIHYDROCHLORIDE 0.125 MG TABS (PRAMIPEXOLE DIHYDROCHLORIDE) 1-4 tabs by mouth qhs  #120 x 3   Entered and Authorized by:   Kelle Darting M.D.   Signed by:   Kelle Darting M.D. on 04/30/2010   Method used:   Electronically to        [facility] (retail)       [address]       [place], [place]  [address]       Ph: [phone]       Fax: [phone]   RxID:   [id number]

## 2010-05-14 ENCOUNTER — Other Ambulatory Visit: Payer: Self-pay | Admitting: Family Medicine

## 2010-05-15 NOTE — Telephone Encounter (Signed)
Yes, okay to RF all three of these x 5.

## 2010-05-15 NOTE — Telephone Encounter (Signed)
Rx's sent to pharm.

## 2010-05-15 NOTE — Telephone Encounter (Signed)
Dr. Baird Cancer you want to refill these meds?  Pt was due for follow up appt on 3/20.  She had surgery on 3/21.  I will call her to schedule appt.  Can we refill until then?

## 2010-06-04 ENCOUNTER — Encounter: Payer: Self-pay | Admitting: Family Medicine

## 2010-06-05 NOTE — Discharge Summary (Signed)
  NAMECYD, [name]               ACCOUNT NO.:  [id number]  MEDICAL RECORD NO.:  [id number]           PATIENT TYPE:  I  LOCATION:  4196                         FACILITY:  [facility]  PHYSICIAN:  Dickey Gave, MDDATE OF BIRTH:  [birth year]  DATE OF ADMISSION:  05/02/2010 DATE OF DISCHARGE:  05/06/2010                              DISCHARGE SUMMARY   ADMISSION DIAGNOSES: 1. Diabetes. 2. Depression. 3. History of panic attacks. 4. Hypercholesterolemia. 5. History of nephrolithiasis. 6. History of gastric bypass. 7. Gallbladder polyp, rule out cancer.  DISCHARGE DIAGNOSES: 1. Diabetes. 2. Depression. 3. History of panic attacks. 4. Hypercholesterolemia. 5. History of nephrolithiasis. 6. History of gastric bypass. 7. Gallbladder polyp, rule out cancer.  PROCEDURES PERFORMED:  On May 02, 2010, lysis of adhesions and open cholecystectomy.  CONSULTS:  None.  HISTORY AND HOSPITAL COURSE:  [name] is a 55 year old female who has history as documented in her history and physical who underwent an ultrasound showing a 0.7 x 1 cm ovoid soft tissue density in her gallbladder that appeared to be a polyp but they also mentioned gallbladder carcinoma as well.  She underwent evaluation with CT scan which did not show anything else concerning and her laboratory evaluation was negative.  She also had some complaints referable to her gastric bypass.  I discussed all the different options with her and she very much just want to have her gallbladder out due to that she was very concerned that this was cancer.  She and I discussed a laparoscopic cholecystectomy which she declined to undergo and so she was taken to the operating room on May 02, 2010, and underwent an uneventful open cholecystectomy.  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  There was no evidence of any adenomatous changes or malignancy.  Postoperatively, she began tolerating clear.   She was maintained on a PCA over the next several days.  She was switched to a regular diet and was transitioned to oral pain medications which she tolerated well.  Her wound remained clean and she was otherwise doing well and discharged home.  Her followup medications are Percocet as needed, Actos, aspirin, atorvastatin, calcium carbonate, Klonopin, vitamin B12, iron, lamotrigine 200 mg, metformin, pramipexole, mirtazapine, multivitamin, stool softener, tramadol, and her vitamin D2.  Her discharge instructions were per [facility] instruction that she was given upon discharge and she is due for a followup in 7 to 10 days for staple removal with [facility].  Her pertinent laboratory evaluation on admission, hematocrit was 31.8. Her pertinent radiologic information is none.     Dickey Gave, MD     MCW/MEDQ  D:  06/05/2010  T:  06/05/2010  Job:  [id number]  Electronically Signed by Rolm Bookbinder MD on 06/05/2010 05:29:31 PM

## 2010-06-29 NOTE — Discharge Summary (Signed)
NAME:  Diana [name], Diana [name]                         ACCOUNT NO.:  [id number]   MEDICAL RECORD NO.:  [id number]                   PATIENT TYPE:  INP   LOCATION:  A302                                 FACILITY:  [facility]   PHYSICIAN:  Jamesetta So, M.D.               DATE OF BIRTH:  Jun 30, 1955   DATE OF ADMISSION:  06/01/2003  DATE OF DISCHARGE:  06/08/2003                                 DISCHARGE SUMMARY   HOSPITAL COURSE:  The patient is a 56 year old white female who presented to  [facility] emergency room with nausea and vomiting.  She was found on CT  scan of the abdomen and pelvis to have a partial small bowel obstruction.  She was subsequently taken to the operating room on June 01, 2003, and  underwent exploratory laparotomy, lysis of adhesions.  She tolerated the  surgery well.  Her postoperative course was for the most part unremarkable.  Her diet was advanced without difficulty once her bowel function returned.   The patient is being discharged home on June 08, 2003, in good and improved  condition.   DISCHARGE INSTRUCTIONS:  The patient is to follow up with Dr. Aviva Signs  on Jun 14, 2003.   DISCHARGE MEDICATIONS:  1. Dilaudid 2-4 mg p.o. q.4h. p.r.n. pain, dispensed 40, no refills.  2. She is to resume all her other medications as previously prescribed.   PRINCIPAL DIAGNOSES:  1. Small bowel obstruction.  2. History of panic attacks.   PRINCIPAL PROCEDURE:  Exploratory laparotomy, lysis of adhesions on June 01, 2003.     ___________________________________________                                         Jamesetta So, M.D.   MAJ/MEDQ  D:  06/08/2003  T:  06/08/2003  Job:  [id number]   cc:   Carolann Littler, M.D.  [address]  [place]  [place] [address]  Fax: [phone]

## 2010-06-29 NOTE — Op Note (Signed)
NAME:  Diana [name], Diana [name]                         ACCOUNT NO.:  [id number]   MEDICAL RECORD NO.:  [id number]                   PATIENT TYPE:  INP   LOCATION:  A302                                 FACILITY:  [facility]   PHYSICIAN:  Jamesetta So, M.D.               DATE OF BIRTH:  08/04/1955   DATE OF PROCEDURE:  06/01/2003  DATE OF DISCHARGE:                                 OPERATIVE REPORT   PREOPERATIVE DIAGNOSIS:  Partial small-bowel obstruction.   POSTOPERATIVE DIAGNOSES:  1. Partial small-bowel obstruction.  2. Internal hernia.   PROCEDURE:  1. Exploratory laparotomy.  2. Lysis of adhesions.   SURGEON:  Jamesetta So, M.D.   ANESTHESIA:  General endotracheal.   INDICATIONS:  The patient is a 55 year old white female, status post a  gastric bypass procedure in the past who now presents with CT scan findings  as well as symptoms of a partial small-bowel obstruction.  The risks and  benefits of the procedure including bleeding, infection, cardiopulmonary  difficulties, and the possibility of a bowel resection were fully explained  to the patient, who gave informed consent.   PROCEDURE NOTE:  The patient was placed in the supine position.  After  induction of general endotracheal anesthesia, the abdomen was prepped and  draped using the usual sterile technique with Betadine.  Surgical site  confirmation was performed.   An incision was made from just above the umbilicus to below the umbilicus.  The peritoneal cavity was entered into without difficulty.  A small amount  of peritoneal fluid was noted.  The bowel was eviscerated.  The patient was  noted to have a significant internal hernia where the remaining loops of  bowel had twisted around the mesentery of the jejunal loop of bowel that was  going in a retrocolic fashion up to the stomach.  This appeared to be due to  a mesenteric defect.  The bowel was untwisted after several adhesions were  lysed.  The mesenteric defect  was then closed from the base of the superior  mesenteric artery distal to the anastomosis of the jejunum to the more  distal small-bowel.  The mesenteric defect was closed using a running 2-0  silk suture as well as multiple interrupted silk sutures.  The bowel was  returned into the abdominal cavity in an orderly fashion after the bowel was  run from the ligament of Treitz to the terminal ileum.   The stomach could not be inspected due to multiple adhesions in the upper  abdominal cavity.  The colon appeared to be within normal limits.  Again,  examination was limited secondary to adhesive disease.  The fascia was  reapproximated using a looped #0 Novofil running suture.  The subcutaneous  layer was irrigated with normal saline and the skin was closed using  staples.  Betadine ointment and dry sterile dressing were applied.   All tape and needle counts  were correct at the end of the procedure.  The  patient was extubated in the operating room and went back to the recovery  room awake, in stable condition.   COMPLICATIONS:  None.   SPECIMEN:  None.   BLOOD LOSS:  Less than 50 cc.      ___________________________________________                                            Jamesetta So, M.D.   MAJ/MEDQ  D:  06/01/2003  T:  06/01/2003  Job:  [id number]   cc:   Jamesetta So, M.D.  [address], [place]  [place] [address]  Fax: [phone]   Carolann Littler, M.D.  [address]  [place]   [address]  Fax: [phone]

## 2010-06-29 NOTE — H&P (Signed)
NAME:  Diana [name], Diana [name]                         ACCOUNT NO.:  [id number]   MEDICAL RECORD NO.:  [id number]                   PATIENT TYPE:  INP   LOCATION:  IC07                                 FACILITY:  [facility]   PHYSICIAN:  Jamesetta So, M.D.               DATE OF BIRTH:  [birth year]   DATE OF ADMISSION:  05/31/2003  DATE OF DISCHARGE:                                HISTORY & PHYSICAL   REASON FOR ADMISSION:  Partial small-bowel obstruction.   HISTORY OF PRESENT ILLNESS:  The patient is a 55 year old white female with  a 10-day history of worsening abdominal pain.  She presented to the  emergency room due to the worsening abdominal pain.  She has had  intermittent episodes of nausea and vomiting.  She describes the pain as  periumbilical in nature with cramping and extension to the back. She denies  any hematemesis, melena, or hematochezia. CT scan of the abdomen and pelvis  reveals possible internal hernia and partial bowel obstruction.  She is  status post a gastric bypass procedure at another facility for weight  reduction and control of her __________ .   PAST MEDICAL HISTORY:  Includes panic attacks.   PAST SURGICAL HISTORY:  As noted above.   CURRENT MEDICATIONS:  Klonopin and Prilosec.   ALLERGIES:  No known drug allergies.   REVIEW OF SYSTEMS:  The patient does smoke daily.  She denies any other  cardiopulmonary difficulties or bleeding disorders.   PHYSICAL EXAMINATION:  GENERAL:  On physical examination, the patient is a  well-developed, well-nourished, white female in no acute distress.  VITAL SIGNS:  She is afebrile and vital signs are stable.  LUNGS:  Clear to auscultation with equal breath sounds bilaterally.  HEART:  Heart examination reveals a regular rate and rhythm without S3, S4,  or murmurs.  ABDOMEN:  The abdomen is soft with voluntary guarding in the periumbilical  region.  No hepatosplenomegaly, masses, or hernias are noted.  A midline  surgical scar  is noted.  No rigidity is noted.  RECTAL:  Examination was deferred at this time.   A CBC and MET-7 are within normal limits.  Liver profile is within normal  limits.  Amylase and lipase are within normal limits.   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   PLAN:  The patient will be taken to the operating room today for an  exploratory laparotomy, lysis of adhesions.  The risks and benefits of the procedure including bleeding, infection,  cardiopulmonary difficulties, and the possibility of recurrence of the bowel  obstruction were fully explained to the patient, who gave informed consent.  She also understands that she may have to undergo a partial bowel resection  as needed.     ___________________________________________  Jamesetta So, M.D.   MAJ/MEDQ  D:  06/01/2003  T:  06/01/2003  Job:  [id number]   cc:   Carolann Littler, M.D.  [address]  [place]  [place] [address]  Fax: [phone]   Jamesetta So, M.D.  [address], [place] [address]  Fax: [phone]

## 2010-08-01 ENCOUNTER — Ambulatory Visit (INDEPENDENT_AMBULATORY_CARE_PROVIDER_SITE_OTHER): Payer: Medicare PPO | Admitting: Family Medicine

## 2010-08-01 ENCOUNTER — Telehealth: Payer: Self-pay | Admitting: Family Medicine

## 2010-08-01 ENCOUNTER — Encounter: Payer: Self-pay | Admitting: Family Medicine

## 2010-08-01 DIAGNOSIS — S139XXA Sprain of joints and ligaments of unspecified parts of neck, initial encounter: Secondary | ICD-10-CM

## 2010-08-01 DIAGNOSIS — G2581 Restless legs syndrome: Secondary | ICD-10-CM

## 2010-08-01 DIAGNOSIS — E538 Deficiency of other specified B group vitamins: Secondary | ICD-10-CM

## 2010-08-01 DIAGNOSIS — E785 Hyperlipidemia, unspecified: Secondary | ICD-10-CM

## 2010-08-01 DIAGNOSIS — K909 Intestinal malabsorption, unspecified: Secondary | ICD-10-CM

## 2010-08-01 DIAGNOSIS — E119 Type 2 diabetes mellitus without complications: Secondary | ICD-10-CM

## 2010-08-01 DIAGNOSIS — IMO0001 Reserved for inherently not codable concepts without codable children: Secondary | ICD-10-CM

## 2010-08-01 DIAGNOSIS — S161XXA Strain of muscle, fascia and tendon at neck level, initial encounter: Secondary | ICD-10-CM

## 2010-08-01 DIAGNOSIS — E559 Vitamin D deficiency, unspecified: Secondary | ICD-10-CM

## 2010-08-01 MED ORDER — HYDROCODONE-ACETAMINOPHEN 10-660 MG PO TABS: 1.0000 | ORAL_TABLET | Freq: Four times a day (QID) | ORAL | Status: DC | PRN

## 2010-08-01 MED ORDER — CYCLOBENZAPRINE HCL 10 MG PO TABS: 10.0000 mg | ORAL_TABLET | Freq: Three times a day (TID) | ORAL | Status: DC | PRN
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
## 2010-08-01 NOTE — Telephone Encounter (Signed)
Pls request records from [facility] in [place], [place]: [phone] is a phone number I got from her ER d/c paperwork.  Thx--PM

## 2010-08-02 DIAGNOSIS — K909 Intestinal malabsorption, unspecified: Secondary | ICD-10-CM | POA: Insufficient documentation

## 2010-08-02 NOTE — Telephone Encounter (Signed)
LM for patient to CB, need to get signed medical record release

## 2010-08-02 NOTE — Assessment & Plan Note (Signed)
Control not optimal lately.  Some noncompliance with meds secondary to financial issues. Check HbA1c today.  Samples of 73m actos given today in case we need to restart this (3 mo worth of samples given). Continue metformin, diet.  Needs urine microalb/cr ratio next visit.

## 2010-08-02 NOTE — Assessment & Plan Note (Signed)
Mild iron, vit B12, vit D def.--s/p roux-en-Y gastric bypass surgery in distant past. She wants to try q2 wk B12 inj instead of monthly to see if her fatigue is helped better. Check Vit b12 level, vit D level, CBC, CMET, TSH.

## 2010-08-02 NOTE — Assessment & Plan Note (Addendum)
Strain + contusion diffusely in neck, right shoulder, right arm.   No fx.  Need ED records from [place]. Change pain med to vicodin 10/660, 1 q6h prn.  Flexeril 60m, 1 q8h prn.  Therapeutic expectations and side effect profile of medication discussed today.  Patient's questions answered. Strongly recommended PT, and she is agreeable to this.  [facility]

## 2010-08-07 ENCOUNTER — Ambulatory Visit (HOSPITAL_COMMUNITY): Payer: Medicare PPO | Admitting: Specialist

## 2010-08-08 ENCOUNTER — Telehealth: Payer: Self-pay | Admitting: Family Medicine

## 2010-08-08 NOTE — Telephone Encounter (Signed)
Patient's granddaughter came to visit with pink eye, patient now has it too, can an Rx be called into [place]?

## 2010-08-08 NOTE — Telephone Encounter (Signed)
Left eye, drainage, "goop in eye", can clean it with warm water and then goops right back up", eye is red.  Seals shut if eye is closed for too long.  Right eye is normal at this time.

## 2010-08-09 NOTE — Telephone Encounter (Signed)
Advised pt that routinely Dr. Anitra Lauth does not give drops without first seeing the eye.  Also discussed with pt that these infections usually viral and drops do not generally help.  I encouraged good hand hygiene, keep hands away from eyes, do not use eye make up or lotions near eyes, sun glasses when she goes outside.  If using cloth to wipe eyes make sure it is rinsed well between using and keep away from uninfected eye.  Pt states she is using cotton balls moistened with sterile water.  Pt declines an appt today as she does not want to drive to [facility].

## 2010-08-14 ENCOUNTER — Ambulatory Visit: Payer: Medicare PPO | Admitting: Family Medicine

## 2010-08-21 ENCOUNTER — Ambulatory Visit (HOSPITAL_COMMUNITY)
Admission: RE | Admit: 2010-08-21 | Discharge: 2010-08-21 | Disposition: A | Discharge status: Home / Self Care | Payer: Medicare PPO | Source: Ambulatory Visit | Attending: Family Medicine | Admitting: Family Medicine

## 2010-08-21 DIAGNOSIS — M25539 Pain in unspecified wrist: Secondary | ICD-10-CM | POA: Insufficient documentation

## 2010-08-21 DIAGNOSIS — E119 Type 2 diabetes mellitus without complications: Secondary | ICD-10-CM | POA: Insufficient documentation

## 2010-08-21 DIAGNOSIS — M25519 Pain in unspecified shoulder: Secondary | ICD-10-CM | POA: Insufficient documentation

## 2010-08-21 DIAGNOSIS — M25529 Pain in unspecified elbow: Secondary | ICD-10-CM | POA: Insufficient documentation

## 2010-08-21 DIAGNOSIS — IMO0001 Reserved for inherently not codable concepts without codable children: Secondary | ICD-10-CM | POA: Insufficient documentation

## 2010-08-21 DIAGNOSIS — M6281 Muscle weakness (generalized): Secondary | ICD-10-CM | POA: Insufficient documentation

## 2010-08-21 DIAGNOSIS — M25629 Stiffness of unspecified elbow, not elsewhere classified: Secondary | ICD-10-CM | POA: Insufficient documentation

## 2010-08-21 DIAGNOSIS — M542 Cervicalgia: Secondary | ICD-10-CM | POA: Insufficient documentation

## 2010-08-21 NOTE — Patient Instructions (Signed)
Patient education on HEP for shoulder table slides and pendulum exercises, tendon glides, and joint blocking exercises.  Instructed patient to use heat to decrease muscle tightness at home.
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
## 2010-08-22 ENCOUNTER — Ambulatory Visit: Payer: Medicare PPO | Admitting: Family Medicine

## 2010-08-23 ENCOUNTER — Ambulatory Visit (HOSPITAL_COMMUNITY)
Admission: RE | Admit: 2010-08-23 | Discharge: 2010-08-23 | Disposition: A | Discharge status: Home / Self Care | Payer: Medicare PPO | Source: Ambulatory Visit | Attending: Family Medicine | Admitting: Family Medicine

## 2010-08-23 DIAGNOSIS — M25519 Pain in unspecified shoulder: Secondary | ICD-10-CM

## 2010-08-23 NOTE — Progress Notes (Signed)
Occupational Therapy Treatment  Patient Name: Diana [name] MRN: [id number] Today's Date: 08/23/2010 [id number] Manual therapy 336-403 Therapeutic ex 404-433 Heat 435-450 Visit 2/16 HPI: Symptoms/Limitations Symptoms: S:  It hurts, it's been a crazy day! Pain Assessment Currently in Pain?: Yes Pain Score:   6 Pain Location: Arm Pain Orientation: Right Pain Type: Acute pain Effect of Pain on Daily Activities: decreased independence wtih ADLs.  Precautions/Restrictions    Mobility       Exercise/Treatments Cervical Exercises Neck Flexion: AROM;10 reps;Seated Neck Extension: AROM;10 reps;Seated Neck Lateral Flexion - Right: AROM;10 reps;Seated Neck Lateral Flexion - Left: AROM;10 reps;Seated Neck Rotation - Right: AROM;10 reps;Seated Neck Rotation - Left: AROM;10 reps;Seated Shoulder Elevation: AROM;10 reps;Seated Shoulder Flexion: PROM;Supine;10 reps Therapy Ball (Shoulder Flexion): 10 Pulleys (Shoulder Flexion): flexion and abduction x 1 min each Shoulder Extension: AROM;10 reps;Seated Shoulder ABduction: PROM;Supine;10 reps Therapy Ball (Shoulder Abduction): 10 Shoulder Horizontal ABduction: PROM;Supine;10 reps Shoulder Retraction: AROM;10 reps;Seated;Other (comment) (supine PROM 10 reps protraction) Row: AROM;Seated;10 reps Shoulder Internal Rotation: PROM;Supine;10 reps;Other (comment) (shld add) Shoulder External Rotation: PROM;10 reps;Supine (shld add) Shoulder Exercises Shoulder Elevation: AROM;10 reps;Seated Shoulder Flexion: PROM;Supine;10 reps Therapy Ball (Shoulder Flexion): 10 Pulleys (Shoulder Flexion): flexion and abduction x 1 min each Shoulder Extension: AROM;10 reps;Seated Shoulder Retraction: AROM;10 reps;Seated;Other (comment) (supine PROM 10 reps protraction) Shoulder Horizontal ABduction: PROM;Supine;10 reps Shoulder ABduction: PROM;Supine;10 reps Therapy Ball (Shoulder Abduction): 10 Shoulder External Rotation: PROM;10 reps;Supine (shld  add) Shoulder Internal Rotation: PROM;Supine;10 reps;Other (comment) (shld add) Row: AROM;Seated;10 reps Additional Elbow Exercises Theraputty - Flatten: yellow Theraputty - Roll: yellow Theraputty - Grip: yellow Additional Wrist Exercises Theraputty - Flatten: yellow Theraputty - Roll: yellow Theraputty - Grip: yellow Hand Exercises Theraputty - Flatten: yellow Theraputty - Roll: yellow Theraputty - Grip: yellow Neurological Re-education Exercises Shoulder Flexion: PROM;Supine;10 reps Therapy Ball (Shoulder Flexion): 10 Pulleys (Shoulder Flexion): flexion and abduction x 1 min each Shoulder ABduction: PROM;Supine;10 reps Therapy Ball (Shoulder Abduction): 10 Shoulder Horizontal ABduction: PROM;Supine;10 reps Shoulder External Rotation: PROM;10 reps;Supine (shld add) Shoulder Internal Rotation: PROM;Supine;10 reps;Other (comment) (shld add) Grasp and Release Theraputty - Flatten: yellow Theraputty - Roll: yellow Theraputty - Grip: yellow Manual Therapy Manual Therapy: Myofascial release Myofascial Release:  (added manual cervical traction) Moist Heat Therapy Number Minutes Moist Heat: 15 Minutes Moist Heat Location: Shoulder;Other (comment) (right shoulder x 15 minutes)   Goals Long Term Goals Long Term Goal 1 Progress: Progressing toward goal Long Term Goal 2 Progress: Progressing toward goal Long Term Goal 4 Progress: Progressing toward goal End of Session Patient Active Problem List  Diagnoses  . 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 UNSPECIFIED VITAMIN D DEFICIENCY  . HYPERLIPIDEMIA  . UNSPECIFIED IRON DEFICIENCY ANEMIA  . BIPOLAR II DISORDER  . ANXIETY STATE, UNSPECIFIED  . DEPRESSION, RECURRENT  . RESTLESS LEG SYNDROME  . FATIGUE  . B12 DEFICIENCY  . NAUSEA  . ABDOMINAL PAIN, RECURRENT  . OTHER SPECIFIED DISORDER OF GALLBLADDER  . Cervical strain, acute  . Malabsorption syndrome  . Pain in joint, shoulder region   End of Session Activity  Tolerance: Patient limited by pain General Behavior During Session: [facility] for tasks performed Cognition: [facility] for tasks performed OT Assessment and Plan Clinical Impression Statement:  (A:  Patient completed several new exercises to increase shou) OT Plan: Attempt dowel rod exercises in supine.    Penelope Galas Ballico 08/23/2010, 4:52 PM

## 2010-08-24 NOTE — Telephone Encounter (Signed)
Rec'd signed medical record release from patient, faxed to [facility]

## 2010-08-27 ENCOUNTER — Ambulatory Visit (HOSPITAL_COMMUNITY)
Admission: RE | Admit: 2010-08-27 | Discharge: 2010-08-27 | Disposition: A | Discharge status: Home / Self Care | Payer: Medicare PPO | Source: Ambulatory Visit | Attending: Family Medicine | Admitting: Family Medicine

## 2010-08-27 ENCOUNTER — Other Ambulatory Visit: Payer: Self-pay | Admitting: Family Medicine

## 2010-08-27 NOTE — Progress Notes (Signed)
Occupational Therapy Treatment  Patient Name: MEDHA PIPPEN MRN: [id number] Today's Date: 08/27/2010  Time in:  11:20  Time out:  12:30 MFR and manuel stretching:  11:20-12:15 [employer] approved thru 8/25 Reassess:  09/18/2010 Visit 3/16  HPI: Symptoms/Limitations Symptoms: I am a 3/10 which is very good for me, I am still babying it. Pain Assessment Currently in Pain?: Yes Pain Score:   3 Pain Location: Shoulder Pain Orientation: Right Pain Type: Chronic pain Pain Onset: More than a month ago Pain Frequency: Constant  Precautions/Restrictions    Mobility       Exercise/Treatments Cervical Exercises Neck Flexion: AROM;10 reps;Seated Neck Extension: AROM;10 reps;Seated Neck Lateral Flexion - Right: AROM;10 reps;Seated Neck Lateral Flexion - Left: AROM;10 reps;Seated Neck Rotation - Right: AROM;10 reps;Seated Neck Rotation - Left: AROM;10 reps;Seated Shoulder Elevation: AROM;10 reps;Seated Shoulder Flexion: PROM;Supine;10 reps Therapy Ball (Shoulder Flexion): 10 Shoulder Extension: AROM;10 reps;Seated Shoulder ABduction: PROM;Supine;10 reps Therapy Ball (Shoulder Abduction): 10 Shoulder Horizontal ABduction: PROM;Supine;10 reps Shoulder Retraction: AROM;10 reps;Seated;Other (comment) Row: AROM;Seated;10 reps Shoulder Internal Rotation: PROM;Supine;10 reps;Other (comment) Shoulder External Rotation: PROM;10 reps;Supine Shoulder Exercises Shoulder Elevation: AROM;10 reps;Seated Shoulder Flexion: PROM;Supine;10 reps Therapy Ball (Shoulder Flexion): 10 Shoulder Extension: AROM;10 reps;Seated Shoulder Retraction: AROM;10 reps;Seated;Other (comment) Shoulder Horizontal ABduction: PROM;Supine;10 reps Shoulder ABduction: PROM;Supine;10 reps Therapy Ball (Shoulder Abduction): 10 Shoulder External Rotation: PROM;10 reps;Supine Shoulder Internal Rotation: PROM;Supine;10 reps;Other (comment) Row: AROM;Seated;10 reps Additional Elbow Exercises Theraputty - Flatten:  time Theraputty - Roll: time Theraputty - Grip: time Additional Wrist Exercises Theraputty - Flatten: time Theraputty - Roll: time Theraputty - Grip: time Hand Exercises Theraputty - Flatten: time Theraputty - Roll: time Theraputty - Grip: time Neurological Re-education Exercises Shoulder Flexion: PROM;Supine;10 reps Therapy Ball (Shoulder Flexion): 10 Shoulder ABduction: PROM;Supine;10 reps Therapy Ball (Shoulder Abduction): 10 Shoulder Horizontal ABduction: PROM;Supine;10 reps Shoulder External Rotation: PROM;10 reps;Supine Shoulder Internal Rotation: PROM;Supine;10 reps;Other (comment) Grasp and Release Theraputty - Flatten: time Theraputty - Roll: time Theraputty - Grip: time Manual Therapy Myofascial Release: MFR and manuel stretching to Right shoulder, cervical, scapula and upper arm  11:20-12:15   Goals Long Term Goals Long Term Goal 1 Progress: Progressing toward goal Long Term Goal 2 Progress: Progressing toward goal Long Term Goal 3 Progress: Progressing toward goal Long Term Goal 4 Progress: Progressing toward goal End of Session Patient Active Problem List  Diagnoses  . 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 UNSPECIFIED VITAMIN D DEFICIENCY  . HYPERLIPIDEMIA  . UNSPECIFIED IRON DEFICIENCY ANEMIA  . BIPOLAR II DISORDER  . ANXIETY STATE, UNSPECIFIED  . DEPRESSION, RECURRENT  . RESTLESS LEG SYNDROME  . FATIGUE  . B12 DEFICIENCY  . NAUSEA  . ABDOMINAL PAIN, RECURRENT  . OTHER SPECIFIED DISORDER OF GALLBLADDER  . Cervical strain, acute  . Malabsorption syndrome  . Pain in joint, shoulder region   End of Session Activity Tolerance: Patient limited by pain OT Assessment and Plan Clinical Impression Statement: Multiple cues to depress shoulder with AROM and MFR.  Pt stated decrease in tightness in upper trap and right cervical area after manuel. Prognosis: Good OT Plan: attempt dowel rod exercises not initiated this session secondary to  time.   Thera Flake, Lillee Mooneyhan L 08/27/2010, 1:23 PM

## 2010-08-28 LAB — COMPREHENSIVE METABOLIC PANEL
Albumin: 4.3 g/dL (ref 3.5–5.2)
BUN: 9 mg/dL (ref 6–23)
CO2: 27 mEq/L (ref 19–32)
Calcium: 9.9 mg/dL (ref 8.4–10.5)
Chloride: 103 mEq/L (ref 96–112)
Glucose, Bld: 145 mg/dL — ABNORMAL HIGH (ref 70–99)
Potassium: 4.8 mEq/L (ref 3.5–5.3)
Sodium: 143 mEq/L (ref 135–145)
Total Protein: 7.3 g/dL (ref 6.0–8.3)

## 2010-08-28 LAB — CBC WITH DIFFERENTIAL/PLATELET
Basophils Relative: 0 % (ref 0–1)
HCT: 42.3 % (ref 36.0–46.0)
Hemoglobin: 13.6 g/dL (ref 12.0–15.0)
Lymphocytes Relative: 35 % (ref 12–46)
Lymphs Abs: 3.1 10*3/uL (ref 0.7–4.0)
MCHC: 32.2 g/dL (ref 30.0–36.0)
Monocytes Absolute: 0.3 10*3/uL (ref 0.1–1.0)
Monocytes Relative: 4 % (ref 3–12)
Neutro Abs: 5.1 10*3/uL (ref 1.7–7.7)
Neutrophils Relative %: 58 % (ref 43–77)
RBC: 4.33 MIL/uL (ref 3.87–5.11)

## 2010-08-28 LAB — LIPID PANEL
Cholesterol: 183 mg/dL (ref 0–200)
HDL: 40 mg/dL (ref >39)
LDL Cholesterol: 101 mg/dL — ABNORMAL HIGH (ref 0–99)
Triglycerides: 209 mg/dL — ABNORMAL HIGH (ref <150)

## 2010-08-28 LAB — VITAMIN D 25 HYDROXY (VIT D DEFICIENCY, FRACTURES): Vit D, 25-Hydroxy: 36 ng/mL (ref 30–89)

## 2010-08-29 ENCOUNTER — Ambulatory Visit (HOSPITAL_COMMUNITY)
Admission: RE | Admit: 2010-08-29 | Discharge: 2010-08-29 | Disposition: A | Discharge status: Home / Self Care | Payer: Medicare PPO | Source: Ambulatory Visit | Attending: Family Medicine | Admitting: Family Medicine

## 2010-08-29 NOTE — Progress Notes (Signed)
Occupational Therapy Treatment  Patient Name: Diana [name] MRN: [id number] Today's Date: 08/29/2010  Time in:  3:05 time out 4:27  HPI: Symptoms/Limitations Symptoms: I have been rushing to get here. Pain Assessment Currently in Pain?: Yes Pain Score:   5 Pain Location: Shoulder Pain Orientation: Left Pain Type: Chronic pain Pain Frequency: Constant  Precautions/Restrictions    Mobility       Exercise/Treatments Cervical Exercises Neck Flexion: AROM;10 reps;Seated Neck Extension: AROM;10 reps;Seated Neck Lateral Flexion - Right: AROM;10 reps;Seated Neck Lateral Flexion - Left: AROM;10 reps;Seated Neck Rotation - Right: AROM;10 reps;Seated Neck Rotation - Left: AROM;10 reps;Seated Shoulder Elevation: AROM;10 reps;Seated Shoulder Flexion: PROM;Supine;10 reps Therapy Ball (Shoulder Flexion): 15 Shoulder Extension: AROM;10 reps;Seated Shoulder ABduction: PROM;Supine;10 reps Therapy Ball (Shoulder Abduction): 15 Shoulder Horizontal ABduction: PROM;Supine;15 reps Shoulder Retraction: AROM;Seated;Other (comment);15 reps Row: AROM;Seated;15 reps Shoulder Internal Rotation: PROM;Supine;Other (comment);15 reps Shoulder External Rotation: PROM;Supine;15 reps Shoulder Exercises Shoulder Elevation: AROM;10 reps;Seated Shoulder Flexion: PROM;Supine;10 reps Therapy Ball (Shoulder Flexion): 15 Shoulder Extension: AROM;10 reps;Seated Shoulder Retraction: AROM;Seated;Other (comment);15 reps Shoulder Horizontal ABduction: PROM;Supine;15 reps Shoulder ABduction: PROM;Supine;10 reps Therapy Ball (Shoulder Abduction): 15 Shoulder External Rotation: PROM;Supine;15 reps Shoulder Internal Rotation: PROM;Supine;Other (comment);15 reps Row: AROM;Seated;15 reps Additional Elbow Exercises Theraputty - Flatten: time Theraputty - Roll: time Theraputty - Grip: time Additional Wrist Exercises Theraputty - Flatten: time Theraputty - Roll: time Theraputty - Grip: time Hand  Exercises Theraputty - Flatten: time Theraputty - Roll: time Theraputty - Grip: time Neurological Re-education Exercises Shoulder Flexion: PROM;Supine;10 reps Therapy Ball (Shoulder Flexion): 15 Shoulder ABduction: PROM;Supine;10 reps Therapy Ball (Shoulder Abduction): 15 Shoulder Horizontal ABduction: PROM;Supine;15 reps Shoulder External Rotation: PROM;Supine;15 reps Shoulder Internal Rotation: PROM;Supine;Other (comment);15 reps Grasp and Release Theraputty - Flatten: time Theraputty - Roll: time Theraputty - Grip: time Manual Therapy Manual Therapy: Myofascial release Myofascial Release: MFR and myofascial release to cervical    Goals Long Term Goals Long Term Goal 1 Progress: Progressing toward goal Long Term Goal 2 Progress: Progressing toward goal Long Term Goal 3 Progress: Progressing toward goal Long Term Goal 4 Progress: Progressing toward goal End of Session Patient Active Problem List  Diagnoses  . 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 UNSPECIFIED VITAMIN D DEFICIENCY  . HYPERLIPIDEMIA  . UNSPECIFIED IRON DEFICIENCY ANEMIA  . BIPOLAR II DISORDER  . ANXIETY STATE, UNSPECIFIED  . DEPRESSION, RECURRENT  . RESTLESS LEG SYNDROME  . FATIGUE  . B12 DEFICIENCY  . NAUSEA  . ABDOMINAL PAIN, RECURRENT  . OTHER SPECIFIED DISORDER OF GALLBLADDER  . Cervical strain, acute  . Malabsorption syndrome  . Pain in joint, shoulder region   End of Session Activity Tolerance: Patient tolerated treatment well OT Assessment and Plan Clinical Impression Statement: decrease in numbness and burning with anitomical position of right UE, added dowel rod ex. Prognosis: Good OT Plan: add putty if pt tol.   Thera Flake, Ger Nicks L 08/29/2010, 4:54 PM

## 2010-08-31 ENCOUNTER — Ambulatory Visit (HOSPITAL_COMMUNITY)
Admission: RE | Admit: 2010-08-31 | Discharge: 2010-08-31 | Disposition: A | Discharge status: Home / Self Care | Payer: Medicare PPO | Source: Ambulatory Visit | Attending: Family Medicine | Admitting: Family Medicine

## 2010-08-31 NOTE — Progress Notes (Signed)
Occupational Therapy Treatment  Patient Name: Diana [name] MRN: [id number] Today's Date: 08/31/2010   OT Time in: 3:39         Time out:  4:50      Manual Therapy: 3:39- 4:20 Visit  5/20 Reassessment date:  8:13     HPI:    Precautions/Restrictions    Mobility       Exercise/Treatments Cervical Exercises Neck Flexion: AROM;10 reps;Seated Neck Extension: AROM;10 reps;Seated Neck Lateral Flexion - Right: AROM;10 reps;Seated Neck Lateral Flexion - Left: AROM;10 reps;Seated Neck Rotation - Right: AROM;10 reps;Seated Neck Rotation - Left: AROM;10 reps;Seated Shoulder Elevation: AROM;10 reps;Seated Shoulder Flexion: PROM;AROM;Right;10 reps;Supine Therapy Ball (Shoulder Flexion): 15 Shoulder Extension: AROM;10 reps;Seated Shoulder ABduction: PROM;AROM;Right;10 reps;Supine Therapy Ball (Shoulder Abduction): 15 Shoulder Horizontal ABduction: PROM;AROM;10 reps;Supine Shoulder Retraction: AROM;Seated;15 reps Row: AROM;Seated;15 reps Shoulder Internal Rotation: PROM;Other (comment);AROM;Right;10 reps;Supine Shoulder External Rotation: PROM;AROM;10 reps;Supine Shoulder Exercises Shoulder Elevation: AROM;10 reps;Seated Shoulder Flexion: PROM;AROM;Right;10 reps;Supine Therapy Ball (Shoulder Flexion): 15 Shoulder Extension: AROM;10 reps;Seated Shoulder Retraction: AROM;Seated;15 reps Shoulder Horizontal ABduction: PROM;AROM;10 reps;Supine Shoulder ABduction: PROM;AROM;Right;10 reps;Supine Therapy Ball (Shoulder Abduction): 15 Shoulder External Rotation: PROM;AROM;10 reps;Supine Shoulder Internal Rotation: PROM;Other (comment);AROM;Right;10 reps;Supine Row: AROM;Seated;15 reps Additional Elbow Exercises Theraputty: Flatten;Roll;Grip;Pinch Theraputty - Flatten: yellow Theraputty - Roll: yellow Theraputty - Grip: yellow Additional Wrist Exercises Theraputty: Flatten;Roll;Grip;Pinch Theraputty - Flatten: yellow Theraputty - Roll: yellow Theraputty - Grip: yellow Hand  Exercises Theraputty: Flatten;Roll;Grip;Pinch Theraputty - Flatten: yellow Theraputty - Roll: yellow Theraputty - Grip: yellow Neurological Re-education Exercises Shoulder Flexion: PROM;AROM;Right;10 reps;Supine Therapy Ball (Shoulder Flexion): 15 Shoulder ABduction: PROM;AROM;Right;10 reps;Supine Therapy Ball (Shoulder Abduction): 15 Shoulder Horizontal ABduction: PROM;AROM;10 reps;Supine Shoulder External Rotation: PROM;AROM;10 reps;Supine Shoulder Internal Rotation: PROM;Other (comment);AROM;Right;10 reps;Supine Grasp and Release Theraputty - Flatten: yellow Theraputty - Roll: yellow Theraputty - Grip: yellow Manual Therapy Manual Therapy: Myofascial release Myofascial Release: MFR and manuel therapy to neck and shoulder and right arm to decrease pain, decrease restrictions and increase AROM of Right UE. (3:39-4:20)   Goals Long Term Goals Long Term Goal 1 Progress: Progressing toward goal Long Term Goal 2 Progress: Partly met Long Term Goal 3 Progress: Progressing toward goal Long Term Goal 4 Progress: Progressing toward goal End of Session Patient Active Problem List  Diagnoses  . 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 UNSPECIFIED VITAMIN D DEFICIENCY  . HYPERLIPIDEMIA  . UNSPECIFIED IRON DEFICIENCY ANEMIA  . BIPOLAR II DISORDER  . ANXIETY STATE, UNSPECIFIED  . DEPRESSION, RECURRENT  . RESTLESS LEG SYNDROME  . FATIGUE  . B12 DEFICIENCY  . NAUSEA  . ABDOMINAL PAIN, RECURRENT  . OTHER SPECIFIED DISORDER OF GALLBLADDER  . Cervical strain, acute  . Malabsorption syndrome  . Pain in joint, shoulder region   OT Assessment and Plan Clinical Impression Statement: Tolerated increase in PROM today, began AROM supine. Prognosis: Good OT Plan: attempt Kateri Plummer, [name] L 08/31/2010, 4:56 PM

## 2010-09-04 ENCOUNTER — Ambulatory Visit (HOSPITAL_COMMUNITY)
Admission: RE | Admit: 2010-09-04 | Discharge: 2010-09-04 | Disposition: A | Discharge status: Home / Self Care | Payer: Medicare PPO | Source: Ambulatory Visit | Attending: Family Medicine | Admitting: Family Medicine

## 2010-09-04 NOTE — Progress Notes (Signed)
Occupational Therapy Treatment  Patient Name: Diana [name] MRN: [id number] Today's Date: 09/04/2010  Time in:  11:40 Time out:  12:15  HPI: Symptoms/Limitations Symptoms: it is a little better today Pain Assessment Currently in Pain?: Yes Pain Score:   2 Pain Location: Arm Pain Orientation: Right Pain Type: Chronic pain  Precautions/Restrictions    Mobility       Exercise/Treatments Cervical Exercises Shoulder Flexion: PROM Shoulder ABduction: PROM Shoulder Internal Rotation: PROM Shoulder External Rotation: PROM Shoulder Exercises Shoulder Flexion: PROM Shoulder ABduction: PROM Shoulder External Rotation: PROM Shoulder Internal Rotation: PROM Neurological Re-education Exercises Shoulder Flexion: PROM Shoulder ABduction: PROM Shoulder External Rotation: PROM Shoulder Internal Rotation: PROM Manual Therapy Manual Therapy: Myofascial release Myofascial Release:  MFR and manuel therapy to neck, shoulder and right arm to decrease pain and restrictions and increase AROM of right UE 11:40-12:15   Goals   End of Session Patient Active Problem List  Diagnoses  . 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 UNSPECIFIED VITAMIN D DEFICIENCY  . HYPERLIPIDEMIA  . UNSPECIFIED IRON DEFICIENCY ANEMIA  . BIPOLAR II DISORDER  . ANXIETY STATE, UNSPECIFIED  . DEPRESSION, RECURRENT  . RESTLESS LEG SYNDROME  . FATIGUE  . B12 DEFICIENCY  . NAUSEA  . ABDOMINAL PAIN, RECURRENT  . OTHER SPECIFIED DISORDER OF GALLBLADDER  . Cervical strain, acute  . Malabsorption syndrome  . Pain in joint, shoulder region   End of Session Activity Tolerance: Patient tolerated treatment well OT Assessment and Plan Clinical Impression Statement: decreased c/o pain and stiffness after manuel and decrease in headache pain after treatment. Prognosis: Good OT Plan: resume exercises as patient tolerates and add UBE   Thera Flake, Elora Wolter L 09/04/2010, 6:59 PM

## 2010-09-05 ENCOUNTER — Ambulatory Visit (HOSPITAL_COMMUNITY): Payer: Medicare PPO | Admitting: Occupational Therapy

## 2010-09-07 ENCOUNTER — Telehealth (HOSPITAL_COMMUNITY): Payer: Self-pay | Admitting: Occupational Therapy

## 2010-09-07 ENCOUNTER — Ambulatory Visit (HOSPITAL_COMMUNITY): Payer: Medicare PPO | Admitting: Occupational Therapy

## 2010-09-10 ENCOUNTER — Ambulatory Visit (HOSPITAL_COMMUNITY)
Admission: RE | Admit: 2010-09-10 | Discharge: 2010-09-10 | Disposition: A | Discharge status: Home / Self Care | Payer: Medicare PPO | Source: Ambulatory Visit | Attending: Family Medicine | Admitting: Family Medicine

## 2010-09-10 NOTE — Progress Notes (Signed)
Occupational Therapy Treatment  Patient Name: Diana [name] MRN: [id number] Today's Date: 09/10/2010   Time In:135             Time Out :244 Manual therapy  135-208 Therapeutic Exercise  208-244 Visit 8/20 Reassess on   09/24/2010   Exercise/Treatments    Cervical Exercises Neck Flexion: AROM;10 reps;Seated Neck Extension: AROM;10 reps;Seated Neck Lateral Flexion - Right: AROM;10 reps;Seated Neck Lateral Flexion - Left: AROM;10 reps;Seated Neck Rotation - Right: AROM;10 reps;Seated Neck Rotation - Left: AROM;10 reps;Seated Shoulder Elevation: AROM;10 reps;Seated Shoulder Flexion: PROM;AAROM;10 reps;Supine Therapy Ball (Shoulder Flexion): 20 Shoulder Extension: AROM;15 reps;Seated Shoulder ABduction: PROM;AAROM;15 reps;Supine Therapy Ball (Shoulder Abduction): 20 5 left and 5 right Shoulder Horizontal ABduction: PROM;AAROM;15 reps;Supine Shoulder Retraction: AROM;Seated;15 reps Row: AROM;Seated;15 reps Shoulder Internal Rotation: PROM;AAROM;15 reps;Supine Shoulder External Rotation: PROM;AAROM;15 reps;Supine Shoulder Exercises Shoulder Elevation: AROM;10 reps;Seated Shoulder Flexion: PROM;AAROM;10 reps;Supine Therapy Ball (Shoulder Flexion): 20 Shoulder Extension: AROM;15 reps;Seated Shoulder Retraction: AROM;Seated;15 reps Shoulder Horizontal ABduction: PROM;AAROM;15 reps;Supine Shoulder ABduction: PROM;AAROM;15 reps;Supine Therapy Ball (Shoulder Abduction): 20 5 left and 5 right Shoulder External Rotation: PROM;AAROM;15 reps;Supine Shoulder Internal Rotation: PROM;AAROM;15 reps;Supine Row: AROM;Seated;15 reps Additional Elbow Exercises Theraputty:  (time) Theraputty - Flatten: time Theraputty - Roll: time Theraputty - Grip: time Additional Wrist Exercises Theraputty:  (time) Theraputty - Flatten: time Theraputty - Roll: time Theraputty - Grip: time Hand Exercises Theraputty:  (time) Theraputty - Flatten: time Theraputty - Roll: time Theraputty - Grip:  time Neurological Re-education Exercises Shoulder Flexion: PROM;AAROM;10 reps;Supine Therapy Ball (Shoulder Flexion): 20 Shoulder ABduction: PROM;AAROM;15 reps;Supine Therapy Ball (Shoulder Abduction): 20 5 left and 5 right Shoulder Horizontal ABduction: PROM;AAROM;15 reps;Supine Shoulder External Rotation: PROM;AAROM;15 reps;Supine Shoulder Internal Rotation: PROM;AAROM;15 reps;Supine Grasp and Release Theraputty - Flatten: time Theraputty - Roll: time Theraputty - Grip: time Manual Therapy Manual Therapy: Myofascial release Myofascial Release: MFR and manuel stretching to right shoulder and neck.. Pt c/0 nexk and shoulder pain.   Goals Long Term Goals Long Term Goal 1 Progress: Progressing toward goal Long Term Goal 2 Progress: Met Long Term Goal 3 Progress: Progressing toward goal Long Term Goal 4 Progress: Progressing toward goal End of Session Patient Active Problem List  Diagnoses  . 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 UNSPECIFIED VITAMIN D DEFICIENCY  . HYPERLIPIDEMIA  . UNSPECIFIED IRON DEFICIENCY ANEMIA  . BIPOLAR II DISORDER  . ANXIETY STATE, UNSPECIFIED  . DEPRESSION, RECURRENT  . RESTLESS LEG SYNDROME  . FATIGUE  . B12 DEFICIENCY  . NAUSEA  . ABDOMINAL PAIN, RECURRENT  . OTHER SPECIFIED DISORDER OF GALLBLADDER  . Cervical strain, acute  . Malabsorption syndrome  . Pain in joint, shoulder region   End of Session Activity Tolerance: Patient tolerated treatment well OT Assessment and Plan Clinical Impression Statement: added ube 2 and 2 at 1.0 Prognosis: Good OT Plan: add t-putty back    Nena Alexander L 09/10/2010, 4:18 PM

## 2010-09-10 NOTE — Progress Notes (Signed)
Occupational Therapy Treatment  Patient Name: Diana [name] MRN: [id number] Today's Date: 09/10/2010  Time In:  135           Time Out :244 Manual therapy 135-208 Therapeutic Exercise 208-244 Visit 7/20 Reassess on  09/24/2010   Exercise/Treatments Cervical Exercises Neck Flexion: AROM;10 reps;Seated Neck Extension: AROM;10 reps;Seated Neck Lateral Flexion - Right: AROM;10 reps;Seated Neck Lateral Flexion - Left: AROM;10 reps;Seated Neck Rotation - Right: AROM;10 reps;Seated Neck Rotation - Left: AROM;10 reps;Seated Shoulder Elevation: AROM;10 reps;Seated Shoulder Flexion: PROM;AAROM;10 reps;Supine Therapy Ball (Shoulder Flexion): 20 Shoulder Extension: AROM;15 reps;Seated Shoulder ABduction: PROM;AAROM;15 reps;Supine Therapy Ball (Shoulder Abduction): 20 5 left and 5 right Shoulder Horizontal ABduction: PROM;AAROM;15 reps;Supine Shoulder Retraction: AROM;Seated;15 reps Row: AROM;Seated;15 reps Shoulder Internal Rotation: PROM;AAROM;15 reps;Supine Shoulder External Rotation: PROM;AAROM;15 reps;Supine Shoulder Exercises Shoulder Elevation: AROM;10 reps;Seated Shoulder Flexion: PROM;AAROM;10 reps;Supine Therapy Ball (Shoulder Flexion): 20 Shoulder Extension: AROM;15 reps;Seated Shoulder Retraction: AROM;Seated;15 reps Shoulder Horizontal ABduction: PROM;AAROM;15 reps;Supine Shoulder ABduction: PROM;AAROM;15 reps;Supine Therapy Ball (Shoulder Abduction): 20 5 left and 5 right Shoulder External Rotation: PROM;AAROM;15 reps;Supine Shoulder Internal Rotation: PROM;AAROM;15 reps;Supine Row: AROM;Seated;15 reps Neurological Re-education Exercises Shoulder Flexion: PROM;AAROM;10 reps;Supine Therapy Ball (Shoulder Flexion): 20 Shoulder ABduction: PROM;AAROM;15 reps;Supine Therapy Ball (Shoulder Abduction): 20 5 left and 5 right Shoulder Horizontal ABduction: PROM;AAROM;15 reps;Supine Shoulder External Rotation: PROM;AAROM;15 reps;Supine Shoulder Internal Rotation: PROM;AAROM;15  reps;Supine     Goals Long Term Goals Long Term Goal 1 Progress: Progressing toward goal Long Term Goal 2 Progress: Met Long Term Goal 3 Progress: Progressing toward goal Long Term Goal 4 Progress: Progressing toward goal End of Session Patient Active Problem List  Diagnoses  . 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 UNSPECIFIED VITAMIN D DEFICIENCY  . HYPERLIPIDEMIA  . UNSPECIFIED IRON DEFICIENCY ANEMIA  . BIPOLAR II DISORDER  . ANXIETY STATE, UNSPECIFIED  . DEPRESSION, RECURRENT  . RESTLESS LEG SYNDROME  . FATIGUE  . B12 DEFICIENCY  . NAUSEA  . ABDOMINAL PAIN, RECURRENT  . OTHER SPECIFIED DISORDER OF GALLBLADDER  . Cervical strain, acute  . Malabsorption syndrome  . Pain in joint, shoulder region   End of Session Activity Tolerance: Patient tolerated treatment well OT Assessment and Plan Clinical Impression Statement: added ube 2 and 2 at 1.0 Prognosis: Good OT Plan: add t-putty back    Nena Alexander L 09/10/2010, 2:57 PM

## 2010-09-12 ENCOUNTER — Ambulatory Visit (HOSPITAL_COMMUNITY)
Admission: RE | Admit: 2010-09-12 | Discharge: 2010-09-12 | Disposition: A | Discharge status: Home / Self Care | Payer: Medicare PPO | Source: Ambulatory Visit | Attending: Family Medicine | Admitting: Family Medicine

## 2010-09-12 DIAGNOSIS — M6281 Muscle weakness (generalized): Secondary | ICD-10-CM | POA: Insufficient documentation

## 2010-09-12 DIAGNOSIS — IMO0001 Reserved for inherently not codable concepts without codable children: Secondary | ICD-10-CM | POA: Insufficient documentation

## 2010-09-12 DIAGNOSIS — M25539 Pain in unspecified wrist: Secondary | ICD-10-CM | POA: Insufficient documentation

## 2010-09-12 DIAGNOSIS — M542 Cervicalgia: Secondary | ICD-10-CM | POA: Insufficient documentation

## 2010-09-12 DIAGNOSIS — E119 Type 2 diabetes mellitus without complications: Secondary | ICD-10-CM | POA: Insufficient documentation

## 2010-09-12 DIAGNOSIS — M25519 Pain in unspecified shoulder: Secondary | ICD-10-CM

## 2010-09-12 DIAGNOSIS — M25529 Pain in unspecified elbow: Secondary | ICD-10-CM | POA: Insufficient documentation

## 2010-09-12 DIAGNOSIS — M25629 Stiffness of unspecified elbow, not elsewhere classified: Secondary | ICD-10-CM | POA: Insufficient documentation

## 2010-09-12 NOTE — Progress Notes (Signed)
Occupational Therapy Treatment  Patient Name: Diana [name] MRN: [id number] Today's Date: 09/12/2010 Time In:107 Time Out : 212 Manual therapy 107-128  Therapeutic Exercise 400-867 Visit 8/24  Reassess on 09/18/2010  HPI: Symptoms/Limitations Symptoms: S:  I think that bike hurt my shoulder and neck. Pain Assessment Currently in Pain?: Yes Pain Score:   6 Pain Location: Shoulder Pain Orientation: Right Pain Type: Acute pain  Precautions/Restrictions    Mobility       Exercise/Treatments Cervical Exercises Neck Flexion: AROM;10 reps;Seated Neck Extension: AROM;10 reps;Seated Neck Lateral Flexion - Right: AROM;10 reps;Seated Neck Lateral Flexion - Left: AROM;10 reps;Seated Neck Rotation - Right: AROM;10 reps;Seated Neck Rotation - Left: AROM;10 reps;Seated Shoulder Elevation: AROM;10 reps;Seated Shoulder Flexion: Supine;PROM;10 reps;AAROM;15 reps Therapy Ball (Shoulder Flexion): 20 Shoulder Extension: AROM;15 reps;Seated Shoulder ABduction: Supine;PROM;10 reps;AAROM;15 reps Therapy Ball (Shoulder Abduction): 20 5 left and 5 right Shoulder Horizontal ABduction: Supine;PROM;10 reps;AAROM;15 reps Shoulder Retraction: AROM;Seated;15 reps Row: AROM;Seated;15 reps Shoulder Internal Rotation: Supine;PROM;10 reps;AAROM;15 reps Shoulder External Rotation: Supine;PROM;10 reps;AAROM;15 reps UBE (Upper Arm Bike): hold this date secondary to increased pain level. Shoulder Exercises Shoulder Elevation: AROM;10 reps;Seated Shoulder Flexion: Supine;PROM;10 reps;AAROM;15 reps Therapy Ball (Shoulder Flexion): 20 Shoulder Extension: AROM;15 reps;Seated Shoulder Retraction: AROM;Seated;15 reps Shoulder Protraction: Supine;PROM;10 reps;AAROM;15 reps Shoulder Horizontal ABduction: Supine;PROM;10 reps;AAROM;15 reps Shoulder ABduction: Supine;PROM;10 reps;AAROM;15 reps Therapy Ball (Shoulder Abduction): 20 5 left and 5 right Shoulder External Rotation: Supine;PROM;10 reps;AAROM;15  reps Shoulder Internal Rotation: Supine;PROM;10 reps;AAROM;15 reps Row: AROM;Seated;15 reps Additional ROM/Strengthening Exercises UBE (Upper Arm Bike): hold this date secondary to increased pain level. Additional Elbow Exercises UBE (Upper Arm Bike): hold this date secondary to increased pain level. Theraputty - Flatten: pink Theraputty - Roll: pink Theraputty - Grip: pink Additional Wrist Exercises Theraputty - Flatten: pink Theraputty - Roll: pink Theraputty - Grip: pink Hand Exercises Theraputty - Flatten: pink Theraputty - Roll: pink Theraputty - Grip: pink Neurological Re-education Exercises Shoulder Flexion: Supine;PROM;10 reps;AAROM;15 reps Therapy Ball (Shoulder Flexion): 20 Shoulder ABduction: Supine;PROM;10 reps;AAROM;15 reps Therapy Ball (Shoulder Abduction): 20 5 left and 5 right Shoulder Protraction: Supine;PROM;10 reps;AAROM;15 reps Shoulder Horizontal ABduction: Supine;PROM;10 reps;AAROM;15 reps Shoulder External Rotation: Supine;PROM;10 reps;AAROM;15 reps Shoulder Internal Rotation: Supine;PROM;10 reps;AAROM;15 reps Grasp and Release Theraputty - Flatten: pink Theraputty - Roll: pink Theraputty - Grip: pink Work Hardening Exercises UBE (Upper Arm Bike): hold this date secondary to increased pain level. Manual Therapy Manual Therapy: Myofascial release Myofascial Release: MFR and manual stretching to right shoulder and cervical region to decrease pain and restrictions.  Deep tissue/trigger point release to trapezius.   Exercises completed one time only.   Goals Home Exercise Program PT Goal: Perform Home Exercise Program - Progress: Progressing toward goal Long Term Goals Long Term Goal 1 Progress: Progressing toward goal Long Term Goal 2 Progress: Progressing toward goal Long Term Goal 3 Progress: Progressing toward goal Long Term Goal 4 Progress: Progressing toward goal Long Term Goal 5 Progress: Progressing toward goal End of Session Patient Active  Problem List  Diagnoses  . 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 UNSPECIFIED VITAMIN D DEFICIENCY  . HYPERLIPIDEMIA  . UNSPECIFIED IRON DEFICIENCY ANEMIA  . BIPOLAR II DISORDER  . ANXIETY STATE, UNSPECIFIED  . DEPRESSION, RECURRENT  . RESTLESS LEG SYNDROME  . FATIGUE  . B12 DEFICIENCY  . NAUSEA  . ABDOMINAL PAIN, RECURRENT  . OTHER SPECIFIED DISORDER OF GALLBLADDER  . Cervical strain, acute  . Malabsorption syndrome  . Pain in joint, shoulder region   End of Session  Activity Tolerance: Patient tolerated treatment well General Behavior During Session: [facility] for tasks performed Cognition: [facility] for tasks performed OT Assessment and Plan Clinical Impression Statement: A:  omitted UBE secondary to increased pain level.  Added dowel rod exercises to HEP and resumed theraputty, increasing to pink.   OT Plan: Attempt AROM in supine and wall wash.  Resume UBE if pain level is down, 2' + 2' 1.0. Reassess.  Diana [name] 09/12/2010, 2:19 PM

## 2010-09-19 ENCOUNTER — Inpatient Hospital Stay (HOSPITAL_COMMUNITY): Admission: RE | Admit: 2010-09-19 | Payer: Medicare PPO | Source: Ambulatory Visit | Admitting: Occupational Therapy

## 2010-09-21 ENCOUNTER — Inpatient Hospital Stay (HOSPITAL_COMMUNITY): Admission: RE | Admit: 2010-09-21 | Payer: Medicare PPO | Source: Ambulatory Visit | Admitting: Specialist

## 2010-09-25 ENCOUNTER — Ambulatory Visit (HOSPITAL_COMMUNITY)
Admission: RE | Admit: 2010-09-25 | Discharge: 2010-09-25 | Disposition: A | Discharge status: Home / Self Care | Payer: Medicare PPO | Source: Ambulatory Visit | Attending: Family Medicine | Admitting: Family Medicine

## 2010-09-25 DIAGNOSIS — M25519 Pain in unspecified shoulder: Secondary | ICD-10-CM

## 2010-09-25 NOTE — Progress Notes (Signed)
Occupational Therapy Treatment  Patient Name: Diana [name] MRN: [id number] Today's Date: 09/25/2010 Time In: 1120 Time Out : 1206  Manual therapy 1120-1150 Reassessment 3005-1102 Therapeutic Exercise [id number]  Visit 9/24  Reassess on 10/23/2010 [facility] due 10/06/10 HPI: Symptoms/Limitations Symptoms: S:  I can use my right hand to wash my body now. Pain Assessment Currently in Pain?: Yes Pain Score:   6 Pain Location: Shoulder Pain Orientation: Right Pain Type: Acute pain         Exercise/Treatments Cervical Exercises Neck Flexion: AROM;10 reps;Seated Neck Extension: AROM;10 reps;Seated Neck Lateral Flexion - Right: AROM;10 reps;Seated Neck Lateral Flexion - Left: AROM;10 reps;Seated Neck Rotation - Right: AROM;10 reps;Seated Neck Rotation - Left: AROM;10 reps;Seated  Shoulder Exercises Shoulder Elevation: AROM;10 reps;Seated Shoulder Flexion: Supine;PROM;10 reps Therapy Ball (Shoulder Flexion): 25 Shoulder Protraction: Supine;PROM;10 reps Shoulder Horizontal ABduction: Supine;PROM;10 reps Shoulder ABduction: Supine;PROM;10 reps Therapy Ball (Shoulder Abduction): 5 right and left Shoulder External Rotation: Supine;PROM;10 reps Shoulder Internal Rotation: Supine;PROM;10 reps  Manual Therapy Manual Therapy: Myofascial release Myofascial Release: MFR and manual stretching to decrease pain and restriction in her right upper arm, scapular, cervical, and shoulder region.   Goals Long Term Goals Long Term Goal 1 Progress: Progressing toward goal Long Term Goal 2 Progress: Met Long Term Goal 3 Progress: Progressing toward goal Long Term Goal 4 Progress: Progressing toward goal End of Session Patient Active Problem List  Diagnoses  . 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 UNSPECIFIED VITAMIN D DEFICIENCY  . HYPERLIPIDEMIA  . UNSPECIFIED IRON DEFICIENCY ANEMIA  . BIPOLAR II DISORDER  . ANXIETY STATE, UNSPECIFIED  . DEPRESSION, RECURRENT  . RESTLESS  LEG SYNDROME  . FATIGUE  . B12 DEFICIENCY  . NAUSEA  . ABDOMINAL PAIN, RECURRENT  . OTHER SPECIFIED DISORDER OF GALLBLADDER  . Cervical strain, acute  . Malabsorption syndrome  . Pain in joint, shoulder region   End of Session Activity Tolerance: Patient tolerated treatment well General Behavior During Session: [facility] for tasks performed Cognition: [facility] for tasks performed OT Assessment and Plan Clinical Impression Statement: A:  Please see progress note.  Omitted AROM in supine and seated due to time constraints.   OT Plan: Continue x 4 weeks towards unmet goals.  Resume AROM in supine and seated, add tband for scapular stability, wall wash, UBE.     Vangie Bicker, OTR/L  09/25/2010, 12:12 PM

## 2010-09-28 ENCOUNTER — Ambulatory Visit (HOSPITAL_COMMUNITY)
Admission: RE | Admit: 2010-09-28 | Discharge: 2010-09-28 | Disposition: A | Discharge status: Home / Self Care | Payer: Medicare PPO | Source: Ambulatory Visit | Attending: Family Medicine | Admitting: Family Medicine

## 2010-09-28 NOTE — Progress Notes (Signed)
Occupational Therapy Treatment  Patient Name: Diana [name] MRN: [id number] Today's Date: 09/28/2010 Time In: 1107 Time Out : 1157 Manual therapy [id number]  Therapeutic Exercise [id number]  Visit 10/24  Reassess on 10/23/2010  HUMANA due 10/06/10  S:  I kept my arm down the entire way down in here. Pain Assessment Currently in Pain?: Yes Pain Score:   5 Pain Location: Shoulder Pain Orientation: Right        Exercise/Treatments Shoulder Exercises Shoulder Flexion: Supine;Seated;AROM;10 reps Therapy Ball (Shoulder Flexion): 25 Shoulder Extension: Theraband;10 reps (red) Shoulder Retraction: Theraband;10 reps (red) Shoulder Protraction: Supine;Seated;AROM;10 reps Shoulder Horizontal ABduction: Supine;Seated;AROM;10 reps Shoulder ABduction: Supine;Seated;AROM;10 reps Therapy Ball (Shoulder Abduction): 25 abduction 5 right and left Shoulder External Rotation: Supine;Seated;AROM;10 reps Shoulder Internal Rotation: Supine;Seated;AROM;10 reps Row: Theraband;10 reps (red) Additional ROM/Strengthening Exercises UBE (Upper Arm Bike): completed 1.0 x 1 minute and then had to stop. Wall Wash: 2 minutes Thumb Tacks: 1 minute Elevation/Depression - Shoulder Pro/Retraction: 1 minute  Manual Therapy Manual Therapy: Myofascial release Myofascial Release: MFR and manual stretching to decrease pain and restrictions in right upper arm, scapular, cervical, and shoulder regions.  PROM to right shoulder protraction, horizontal abduction, external rotation, internal rotation, flexion, and abduction 10 reps.   Goals   End of Session Patient Active Problem List  Diagnoses  . 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 UNSPECIFIED VITAMIN D DEFICIENCY  . HYPERLIPIDEMIA  . UNSPECIFIED IRON DEFICIENCY ANEMIA  . BIPOLAR II DISORDER  . ANXIETY STATE, UNSPECIFIED  . DEPRESSION, RECURRENT  . RESTLESS LEG SYNDROME  . FATIGUE  . B12 DEFICIENCY  . NAUSEA  . ABDOMINAL PAIN, RECURRENT  .  OTHER SPECIFIED DISORDER OF GALLBLADDER  . Cervical strain, acute  . Malabsorption syndrome  . Pain in joint, shoulder region   End of Session Activity Tolerance: Patient tolerated treatment well General Behavior During Session: [facility] for tasks performed Cognition: [facility] for tasks performed OT Assessment and Plan Clinical Impression Statement: A:  UBE too painful to complete. OT Plan: P:  Attempt UBE again.     Vangie Bicker, OTR/L  09/28/2010, 12:13 PM

## 2010-10-01 ENCOUNTER — Telehealth: Payer: Self-pay | Admitting: Family Medicine

## 2010-10-01 ENCOUNTER — Ambulatory Visit (HOSPITAL_COMMUNITY)
Admission: RE | Admit: 2010-10-01 | Discharge: 2010-10-01 | Disposition: A | Discharge status: Home / Self Care | Payer: Medicare PPO | Source: Ambulatory Visit | Attending: Family Medicine | Admitting: Family Medicine

## 2010-10-01 ENCOUNTER — Other Ambulatory Visit: Payer: Self-pay | Admitting: Family Medicine

## 2010-10-01 DIAGNOSIS — M25519 Pain in unspecified shoulder: Secondary | ICD-10-CM

## 2010-10-01 MED ORDER — HYDROCODONE-ACETAMINOPHEN 10-660 MG PO TABS: 1.0000 | ORAL_TABLET | Freq: Four times a day (QID) | ORAL | Status: DC | PRN

## 2010-10-01 NOTE — Telephone Encounter (Signed)
I eRx'd pain med to [place]. NO MRI needed.  Just make f/u appt here w/ me.  Thx--PM

## 2010-10-01 NOTE — Telephone Encounter (Signed)
I called patient and scheduled a follow up appointment with Dr. Anitra Lauth on August 21,2012 @ 11:00.

## 2010-10-01 NOTE — Progress Notes (Signed)
Occupational Therapy Treatment  Patient Name: Diana [name] MRN: [id number] Today's Date: 10/01/2010 Time In: 312 Time Out : 355 Manual therapy [id number] Visit 11/24  Reassess on 10/23/2010  HUMANA due 10/06/10  HPI: S:  " I have been in so much pain this weekend.  I don't know what it is, I almost didn't come to therapy today." Limitations: Hold all therapeutic exercises for today due to heightened pain. Pain Assessment Currently in Pain?: Yes Pain Score:   8 Pain Location: Shoulder Pain Orientation: Right Pain Type: Acute pain  Exercise/Treatments UBE (Upper Arm Bike): dc until further notice Manual Therapy Manual Therapy: Myofascial release Myofascial Release: MFR to right upper arm anterior, lateral, and posterior aspect, scapular region, supraspinatus, and subscapularis.  Manual traction to right arm x 3 min with counterstretch at neck to rotate to the  left.  Manual traction to neck, suboccipital release.  MFR to trapezius.     Goals Long Term Goals Long Term Goal 1 Progress: Progressing toward goal Long Term Goal 2 Progress: Met Long Term Goal 3 Progress: Progressing toward goal Long Term Goal 4 Progress: Progressing toward goal End of Session Patient Active Problem List  Diagnoses  . 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 UNSPECIFIED VITAMIN D DEFICIENCY  . HYPERLIPIDEMIA  . UNSPECIFIED IRON DEFICIENCY ANEMIA  . BIPOLAR II DISORDER  . ANXIETY STATE, UNSPECIFIED  . DEPRESSION, RECURRENT  . RESTLESS LEG SYNDROME  . FATIGUE  . B12 DEFICIENCY  . NAUSEA  . ABDOMINAL PAIN, RECURRENT  . OTHER SPECIFIED DISORDER OF GALLBLADDER  . Cervical strain, acute  . Malabsorption syndrome  . Pain in joint, shoulder region   End of Session Activity Tolerance: Patient tolerated treatment well General Behavior During Session: [facility] for tasks performed Cognition: [facility] for tasks performed OT Assessment and Plan Clinical Impression Statement: A:  Omit all therapeutic  exercises today.  DC UBE until further notice.  Pain level was at 8/10 at beginning of session, decreased to 4/10 after MFR intervention. OT Plan: P:  Resume therapeutic exercises while keeping pain level to a minimum.   Vangie Bicker, OTR/L  10/01/2010, 4:07 PM

## 2010-10-01 NOTE — Telephone Encounter (Signed)
Patient wants to know if she should have the MRI before scheduling her appointment. Patient also needs more pain medication.

## 2010-10-02 ENCOUNTER — Encounter: Payer: Self-pay | Admitting: Family Medicine

## 2010-10-02 ENCOUNTER — Ambulatory Visit (INDEPENDENT_AMBULATORY_CARE_PROVIDER_SITE_OTHER): Payer: Medicare PPO | Admitting: Family Medicine

## 2010-10-02 VITALS — BP 124/76 | HR 101 | Temp 98.1°F | Wt 167.0 lb

## 2010-10-02 DIAGNOSIS — M542 Cervicalgia: Secondary | ICD-10-CM

## 2010-10-02 DIAGNOSIS — R110 Nausea: Secondary | ICD-10-CM

## 2010-10-02 DIAGNOSIS — R5381 Other malaise: Secondary | ICD-10-CM

## 2010-10-02 DIAGNOSIS — E119 Type 2 diabetes mellitus without complications: Secondary | ICD-10-CM

## 2010-10-02 DIAGNOSIS — IMO0001 Reserved for inherently not codable concepts without codable children: Secondary | ICD-10-CM

## 2010-10-02 MED ORDER — LAMOTRIGINE 200 MG PO TABS: ORAL_TABLET | ORAL | Status: DC

## 2010-10-02 MED ORDER — METAXALONE 800 MG PO TABS: ORAL_TABLET | ORAL | Status: AC

## 2010-10-02 MED ORDER — OXYCODONE HCL 15 MG PO TABS: 15.0000 mg | ORAL_TABLET | Freq: Four times a day (QID) | ORAL | Status: AC | PRN

## 2010-10-02 MED ORDER — CYANOCOBALAMIN 1000 MCG/ML IJ SOLN: INTRAMUSCULAR | Status: DC

## 2010-10-02 MED ORDER — ONETOUCH DELICA LANCETS MISC: Status: DC

## 2010-10-02 MED ORDER — OXYCODONE HCL 15 MG PO TABS: 15.0000 mg | ORAL_TABLET | Freq: Four times a day (QID) | ORAL | Status: DC | PRN

## 2010-10-02 NOTE — Progress Notes (Signed)
OFFICE VISIT  10/03/2010   CC:  Chief Complaint  Patient presents with  . Arm Pain    follow up     HPI:    Patient is a 55 y.o. Caucasian female who presents for neck pain, DM 2, chronic fatigue, and chronic/episodic nausea. Despite doing 6 weeks of PT she still has severe, debilitating pain in her neck on the right side, extending down into right shoulder, right arm, and right hand.  Describeds "tightening-up" frequently, muscle spasms in this area.  Tingling/numbness in same distribution, +mild weakness in right arm. Asks for pain med stronger than vicodin 10/650, even though oxycodone caused itching. Says flexeril helped none.  The pain is impairing functioning and sleep.    She has not been monitoring her glucoses--has not had lancets lately.  Diabetic diet sometimes.  Takes metformin only (has actos samples x 2 mo supply but hasn't taken these b/c she's waiting on me to check HbA1c to see if she needs to start it).  Asks if she can start taking her vit B12 injections as often as q2 wks to see if it will help with her fatigue.  She thinks the lamictal is the culprit for her episodic nausea, wants to come off it or lower dose.  Reviewed last labs in detail with her today: transaminases mildly up (chronic), lipids ok, CBC and metabolic profile ok.   Needs HbA1c testing today.  Past Medical History  Diagnosis Date  . Diabetes mellitus     urine protein-Cr ratio nl 09/2009, D.R. Screen neg 10/2009  . Obesity     s/p bariatric surgery  . Hepatic steatosis 2005    ultrasound  . Bipolar disorder     questionable  . Restless legs syndrome   . Insomnia   . Neurosis, anxiety, panic type   . Chronic fatigue   . Iron deficiency anemia 09/2009    malabsorbtion s/p bariatric surgery  . Vitamin D deficiency 09/2009  . Other B-complex deficiencies   . Nephrolithiasis     Past Surgical History  Procedure Date  . Roux-en-y gastric bypass   . Tonsillectomy   . Abdominal  hysterectomy     fibroids, ovaries remain  . Bladder surgery     bladder tack  . Appendectomy   . Hernia repair 2005    mesenteric hernia repair, with lysis of adhesions (presented with SBO)  . Cholecystectomy 04/2010    w/lysis of adhesions (open procedure)--path showed chronic cholecystitis and cholesterol polyp.    Outpatient Prescriptions Prior to Visit  Medication Sig Dispense Refill  . aspirin 81 MG tablet Take 81 mg by mouth daily.        Marland Kitchen atorvastatin (LIPITOR) 20 MG tablet Take 20 mg by mouth daily.        . calcium elemental as carbonate (TUMS ULTRA 1000) 400 MG tablet Chew 1,000 mg by mouth daily.        . clonazePAM (KLONOPIN) 2 MG tablet Take 2 mg by mouth 3 (three) times daily.        . ergocalciferol (VITAMIN D2) 50000 UNITS capsule Take 50,000 Units by mouth once a week.        . ferrous sulfate 325 (65 FE) MG tablet Take 325 mg by mouth daily.        Marland Kitchen GLUCOPHAGE 1000 MG tablet TAKE (1) TABLET BY MOUTH TWICE DAILY.  60 each  4  . mirtazapine (REMERON) 45 MG tablet Take 45 mg by mouth at bedtime.        Marland Kitchen  Multiple Vitamin (MULTIVITAMIN) tablet Take 1 tablet by mouth daily.        . promethazine (PHENERGAN) 12.5 MG tablet Take 1-2 tablet every 6 hours as needed for nausea       . traMADol (ULTRAM) 50 MG tablet Take 1 tab two times a day and at bedtime as directed       . WELLBUTRIN XL 150 MG 24 hr tablet TAKE ONE TABLET DAILY.  30 each  4  . cyanocobalamin (,VITAMIN B-12,) 1000 MCG/ML injection Inject 1,000 mcg into the muscle every 30 (thirty) days.        . Hydrocodone-Acetaminophen 10-660 MG TABS Take 1 tablet by mouth every 6 (six) hours as needed.  50 each  0  . LAMICTAL 200 MG tablet TAKE (1) TABLET BY MOUTH EACH MORNING.  30 each  4  . pioglitazone (ACTOS) 15 MG tablet Take 15 mg by mouth daily.        Marland Kitchen oxyCODONE-acetaminophen (PERCOCET) 7.5-325 MG per tablet Take 1 tablet by mouth Every 6 hours as needed.      . pramipexole (MIRAPEX) 0.125 MG tablet Take 1-4  tablets by mouth at bedtime         Allergies  Allergen Reactions  . Codeine     REACTION: itching  . Latex   . Penicillins     REACTION: took large quantities as a child and was told to never take again    ROS As per HPI  PE: Blood pressure 124/76, pulse 101, temperature 98.1 F (36.7 C), temperature source Oral, weight 167 lb (75.751 kg), SpO2 97.00%. Gen: Alert, well appearing.  Patient is oriented to person, place, time, and situation. Holds right arm flexed at elbow, lying up against her chest for comfort. Neck is TTP in midline and right paraspinous soft tissues diffusely, +spurlings with head tilted to the right. She has TTP in right trapezius and suprascapular areas, deltoid as well but less so.  Some trace weakness detected in right arm extension/flexion, wrist strength, and fingers strength.  Biceps and triceps DTRs trace bilaterally.   CV: RRR LUNGS: CTA bilat, nonlabored resps. EXT: no c/c/e  LABS:  none  IMPRESSION AND PLAN:  Cervical pain With right arm radiculopathy symptoms, not responding at all to PT. Will further assess with cervical MRI w/out contrast at [facility] ASAP. Continue PT.  Pain meds changed to oxycodone 87m, 1/2-1 tab q6h prn, #120, no RF. Muscle relaxer changed to skelaxin 8024mq8h prn. F/u 45m35moNAUSEA Possible side effect of her mood stabilizer lamictal. Decrease lamictal to 49m30md instead of one 200mg22me once daily. Reassess in 1 mo.  DIAB W/O MENTION COMP TYPE II/UNS TYPE UNCNTRL New rx for lancets done today, check glucose bid. Continue metformin.  She has actos samples at home to add if HbA1c up today. Urine microalb/cr ratio sent today. Will do foot exam at f/u in 45mo. 69moIGUE Chronic, multifactorial (polypharmacy, anxiety, multiple comorbidities). She wants to try more frequent B12 injections to see if this makes a difference and I'm ok with trying this. Vit B12 1000 mcg IM q2 wks rx'd--she does home  injections.     FOLLOW UP: Return in about 1 month (around 11/02/2010) for f/u neck pain, DM, and bipolar d/o.

## 2010-10-03 ENCOUNTER — Ambulatory Visit (HOSPITAL_COMMUNITY)
Admission: RE | Admit: 2010-10-03 | Discharge: 2010-10-03 | Disposition: A | Discharge status: Home / Self Care | Payer: Medicare PPO | Source: Ambulatory Visit | Attending: Occupational Therapy | Admitting: Occupational Therapy

## 2010-10-03 DIAGNOSIS — M542 Cervicalgia: Secondary | ICD-10-CM | POA: Insufficient documentation

## 2010-10-03 LAB — MICROALBUMIN / CREATININE URINE RATIO
Creatinine, Urine: 81.3 mg/dL
Microalb, Ur: 0.5 mg/dL (ref 0.00–1.89)

## 2010-10-03 NOTE — Assessment & Plan Note (Signed)
With right arm radiculopathy symptoms, not responding at all to PT. Will further assess with cervical MRI w/out contrast at [facility] ASAP. Continue PT.  Pain meds changed to oxycodone 26m, 1/2-1 tab q6h prn, #120, no RF. Muscle relaxer changed to skelaxin 8032mq8h prn. F/u 57m4mo

## 2010-10-03 NOTE — Assessment & Plan Note (Signed)
New rx for lancets done today, check glucose bid. Continue metformin.  She has actos samples at home to add if HbA1c up today. Urine microalb/cr ratio sent today. Will do foot exam at f/u in 31mo

## 2010-10-03 NOTE — Assessment & Plan Note (Signed)
Possible side effect of her mood stabilizer lamictal. Decrease lamictal to 36m bid instead of one 2042mdose once daily. Reassess in 1 mo.

## 2010-10-03 NOTE — Progress Notes (Signed)
Occupational Therapy Treatment  Patient Name: Diana [name] MRN: [id number] Today's Date: 10/03/2010  Time In: 107 Time Out :145  Manual therapy 107-145  Visit 12/24  Reassess on 10/23/2010  HUMANA due 10/06/10    HPI: Symptoms/Limitations Symptoms: The doctor wants me to only have massage till the MRI is dont Pain Assessment Currently in Pain?: Yes Pain Score:   6 Pain Location: Shoulder Pain Orientation: Right Pain Type: Acute pain Pain Radiating Towards: radiates down into hand Pain Onset: More than a month ago Pain Frequency: Constant        Exercise/Treatments Shoulder Exercises Shoulder Flexion: PROM;Supine;10 reps Shoulder Protraction: PROM;10 reps;Supine Shoulder Horizontal ABduction: PROM;10 reps;Supine Shoulder ABduction: PROM;10 reps;Supine Shoulder External Rotation: PROM;10 reps;Supine Shoulder Internal Rotation: PROM;10 reps;Supine     Goals Long Term Goals Long Term Goal 1 Progress: Progressing toward goal Long Term Goal 2 Progress: Met Long Term Goal 3 Progress: Progressing toward goal Long Term Goal 4 Progress: Progressing toward goal End of Session Patient Active Problem List  Diagnoses  . 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 UNSPECIFIED VITAMIN D DEFICIENCY  . HYPERLIPIDEMIA  . UNSPECIFIED IRON DEFICIENCY ANEMIA  . BIPOLAR II DISORDER  . ANXIETY STATE, UNSPECIFIED  . DEPRESSION, RECURRENT  . RESTLESS LEG SYNDROME  . FATIGUE  . B12 DEFICIENCY  . NAUSEA  . ABDOMINAL PAIN, RECURRENT  . Malabsorption syndrome  . Cervical pain   End of Session Activity Tolerance: Patient tolerated treatment well OT Assessment and Plan Clinical Impression Statement: omit all exercise per MD until MRI and patient is released to begin exercises Prognosis: Good OT Plan: continue manuel until further instructed by MD   Shaquile Lutze L. Thera Flake, COTA/L  10/03/2010, 2:58 PM

## 2010-10-03 NOTE — Assessment & Plan Note (Signed)
Chronic, multifactorial (polypharmacy, anxiety, multiple comorbidities). She wants to try more frequent B12 injections to see if this makes a difference and I'm ok with trying this. Vit B12 1000 mcg IM q2 wks rx'd--she does home injections.

## 2010-10-04 ENCOUNTER — Other Ambulatory Visit: Payer: Self-pay | Admitting: Family Medicine

## 2010-10-05 NOTE — Telephone Encounter (Signed)
Put in by mistake

## 2010-10-08 ENCOUNTER — Ambulatory Visit (HOSPITAL_COMMUNITY)
Admission: RE | Admit: 2010-10-08 | Discharge: 2010-10-08 | Disposition: A | Discharge status: Home / Self Care | Payer: Medicare PPO | Source: Ambulatory Visit | Attending: Family Medicine | Admitting: Family Medicine

## 2010-10-08 ENCOUNTER — Other Ambulatory Visit: Payer: Self-pay | Admitting: Family Medicine

## 2010-10-08 DIAGNOSIS — M5380 Other specified dorsopathies, site unspecified: Secondary | ICD-10-CM | POA: Insufficient documentation

## 2010-10-08 DIAGNOSIS — M5020 Other cervical disc displacement, unspecified cervical region: Secondary | ICD-10-CM | POA: Insufficient documentation

## 2010-10-08 DIAGNOSIS — M542 Cervicalgia: Secondary | ICD-10-CM

## 2010-10-08 NOTE — Telephone Encounter (Signed)
Pt last seen 10/02/10, next follow up scheduled for 11/02/10.  OK to fill?  Please advise.

## 2010-10-08 NOTE — Telephone Encounter (Signed)
I RF'd these today so this is complete.

## 2010-10-08 NOTE — Progress Notes (Signed)
Occupational Therapy Treatment  Patient Name: MARYANNA STUBER MRN: [id number] Today's Date: 10/08/2010 . Time in:  354  Time out [place] on 10/23/2010  HUMANA due 10/06/10  HPI: Symptoms/Limitations Symptoms: I get my MRI today Pain Assessment Currently in Pain?: Yes Pain Score:   3 Pain Location: Shoulder Pain Orientation: Right Pain Type: Acute pain            Exercise/Treatments  Audelia Hives only till further info from MD.     Goals Long Term Goals Long Term Goal 1 Progress: Progressing toward goal Long Term Goal 2 Progress: Progressing toward goal Long Term Goal 3 Progress: Progressing toward goal Long Term Goal 4 Progress: Progressing toward goal Long Term Goal 5 Progress: Progressing toward goal End of Session Patient Active Problem List  Diagnoses  . 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 UNSPECIFIED VITAMIN D DEFICIENCY  . HYPERLIPIDEMIA  . UNSPECIFIED IRON DEFICIENCY ANEMIA  . BIPOLAR II DISORDER  . ANXIETY STATE, UNSPECIFIED  . DEPRESSION, RECURRENT  . RESTLESS LEG SYNDROME  . FATIGUE  . B12 DEFICIENCY  . NAUSEA  . ABDOMINAL PAIN, RECURRENT  . Malabsorption syndrome  . Cervical pain   End of Session Activity Tolerance: Patient tolerated treatment well OT Assessment and Plan Clinical Impression Statement: all exercises held again today per MD. Prognosis: Good OT Plan: continue with manuel until further instructed by md   Meryl Crutch 10/08/2010, 6:31 PM

## 2010-10-09 ENCOUNTER — Other Ambulatory Visit: Payer: Self-pay | Admitting: Family Medicine

## 2010-10-10 ENCOUNTER — Ambulatory Visit (HOSPITAL_COMMUNITY)
Admission: RE | Admit: 2010-10-10 | Discharge: 2010-10-10 | Disposition: A | Discharge status: Home / Self Care | Payer: Medicare PPO | Source: Ambulatory Visit | Attending: Occupational Therapy | Admitting: Occupational Therapy

## 2010-10-10 ENCOUNTER — Telehealth: Payer: Self-pay | Admitting: *Deleted

## 2010-10-10 DIAGNOSIS — M25519 Pain in unspecified shoulder: Secondary | ICD-10-CM

## 2010-10-10 DIAGNOSIS — E119 Type 2 diabetes mellitus without complications: Secondary | ICD-10-CM

## 2010-10-10 NOTE — Progress Notes (Signed)
Occupational Therapy Treatment  Patient Name: Diana [name] MRN: [id number] Today's Date: 10/10/2010  Time in: 32 Time out [place] on 10/23/2010  HUMANA due 10/06/10     HPI: Symptoms/Limitations Symptoms: I took pain and spasm medicine today and am still a 3/10 Pain Assessment Currently in Pain?: Yes Pain Score:   3 Pain Location: Shoulder Pain Orientation: Right Pain Type: Acute pain Pain Onset: More than a month ago Pain Frequency: Constant       Exercise/Treatments   Manual Therapy Myofascial Release: MFR and manual stretching to right trapezius, scapular, and upper arm region with PROM through all shoulder ranges to decrease pain and restrictions and increase AROM and strength   Goals Long Term Goals Long Term Goal 1 Progress: Progressing toward goal Long Term Goal 2 Progress: Met Long Term Goal 3 Progress: Progressing toward goal Long Term Goal 4 Progress: Progressing toward goal End of Session Patient Active Problem List  Diagnoses  . 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 UNSPECIFIED VITAMIN D DEFICIENCY  . HYPERLIPIDEMIA  . UNSPECIFIED IRON DEFICIENCY ANEMIA  . BIPOLAR II DISORDER  . ANXIETY STATE, UNSPECIFIED  . DEPRESSION, RECURRENT  . RESTLESS LEG SYNDROME  . FATIGUE  . B12 DEFICIENCY  . NAUSEA  . ABDOMINAL PAIN, RECURRENT  . Malabsorption syndrome  . Cervical pain   End of Session Activity Tolerance: Patient tolerated treatment well OT Assessment and Plan Clinical Impression Statement: continue to hold exercises until further notice per MD Prognosis: Good OT Plan: continue to decrease pain and increase painfree ROM   Elisama Thissen L. Abdulhadi Stopa, COTA/L  10/10/2010, 11:17 AM

## 2010-10-10 NOTE — Telephone Encounter (Signed)
PC from [place] at [facility].  Pt is at lab, but has no orders.  Order released and faxed to lab at [phone].

## 2010-10-16 ENCOUNTER — Ambulatory Visit (HOSPITAL_COMMUNITY): Payer: Medicare PPO | Admitting: Occupational Therapy

## 2010-10-16 ENCOUNTER — Telehealth: Payer: Self-pay | Admitting: *Deleted

## 2010-10-16 ENCOUNTER — Other Ambulatory Visit: Payer: Self-pay | Admitting: Family Medicine

## 2010-10-16 DIAGNOSIS — M489 Spondylopathy, unspecified: Secondary | ICD-10-CM

## 2010-10-16 NOTE — Telephone Encounter (Signed)
Pt had MRI on 10/09/10.  Pt has PT appt today at 1pm, but wonders if she should keep this appointment.  Please advise. Thanks

## 2010-10-18 ENCOUNTER — Ambulatory Visit (HOSPITAL_COMMUNITY): Payer: Medicare PPO | Admitting: Specialist

## 2010-11-02 ENCOUNTER — Ambulatory Visit (INDEPENDENT_AMBULATORY_CARE_PROVIDER_SITE_OTHER): Payer: Medicare PPO | Admitting: Family Medicine

## 2010-11-02 ENCOUNTER — Encounter: Payer: Self-pay | Admitting: Family Medicine

## 2010-11-02 VITALS — BP 118/78 | HR 127 | Temp 98.4°F | Wt 166.0 lb

## 2010-11-02 DIAGNOSIS — IMO0001 Reserved for inherently not codable concepts without codable children: Secondary | ICD-10-CM

## 2010-11-02 DIAGNOSIS — M542 Cervicalgia: Secondary | ICD-10-CM

## 2010-11-02 DIAGNOSIS — Z23 Encounter for immunization: Secondary | ICD-10-CM

## 2010-11-02 DIAGNOSIS — F4321 Adjustment disorder with depressed mood: Secondary | ICD-10-CM | POA: Insufficient documentation

## 2010-11-02 MED ORDER — LAMOTRIGINE 200 MG PO TABS: ORAL_TABLET | ORAL | Status: DC

## 2010-11-02 MED ORDER — OXYCODONE HCL 15 MG PO TABS: 15.0000 mg | ORAL_TABLET | Freq: Four times a day (QID) | ORAL | Status: DC | PRN

## 2010-11-02 MED ORDER — VITAMIN D (ERGOCALCIFEROL) 1.25 MG (50000 UNIT) PO CAPS: ORAL_CAPSULE | ORAL | Status: DC

## 2010-11-02 MED ORDER — PRAMIPEXOLE DIHYDROCHLORIDE 0.125 MG PO TABS: ORAL_TABLET | ORAL | Status: DC

## 2010-11-02 NOTE — Progress Notes (Signed)
OFFICE NOTE  11/02/2010  CC:  Chief Complaint  Patient presents with  . Follow-up     HPI:   Patient is a 55 y.o. Caucasian female who is here for f/u chronic pain and DM2. Unfortunately, he step son committed suicide 2 wks ago and her life has been in [place] since.  Hasn't  Been able to take any meds except her antidepressants, antianxiety meds, and pain meds for her neck.  Glucoses in mid 200s. She is dealing with chronic high level of worry, sadness, feeling of being overwhelmed---consistent with acute grief rxn superimposed on her chronic anxiety and depression.  Denies suicidal thinking.  "I've got to hold it together for our 54 yr old".   Eating poorly, sleeping poorly. Says she starting retaking her lamictal yesterday at split dosing (1/2 of a 215m tab twice per day) and it did not cause nausea taken this way.  Mood also felt a bit better last 24h. Neck painful all the time, head feels "heavy", still with right arm pain as well.  Not doing PT anymore, asks if she can restart this and do it up until her neurosurgery consult with Dr. RCarloyn Mannerat the end of October.  Pertinent PMH:  DM 2, non insulin-requiring Depression/anxiety/panic/agoraphobia Hx of malabsorption s/p gastric bipass surgery Chronic fatigue Recurrent nausea Cervical spine strain, with DDD and nerve impingement 09/2010  MEDS;   Outpatient Prescriptions Prior to Visit  Medication Sig Dispense Refill  . aspirin 81 MG tablet Take 81 mg by mouth daily.        .Marland Kitchenatorvastatin (LIPITOR) 20 MG tablet Take 20 mg by mouth daily.        . calcium elemental as carbonate (TUMS ULTRA 1000) 400 MG tablet Chew 1,000 mg by mouth daily.        . clonazePAM (KLONOPIN) 2 MG tablet Take 2 mg by mouth 3 (three) times daily.        . cyanocobalamin (,VITAMIN B-12,) 1000 MCG/ML injection One injection (15m IM every 2 wks  10 mL  2  . ferrous sulfate 325 (65 FE) MG tablet Take 325 mg by mouth daily.        . Marland KitchenLUCOPHAGE 1000 MG tablet  TAKE (1) TABLET BY MOUTH TWICE DAILY.  60 each  4  . Multiple Vitamin (MULTIVITAMIN) tablet Take 1 tablet by mouth daily.        . Glory RosebushELICA LANCETS MISC Check glucose bid  60 each  6  . pioglitazone (ACTOS) 15 MG tablet Take 15 mg by mouth daily.        . promethazine (PHENERGAN) 12.5 MG tablet Take 1-2 tablet every 6 hours as needed for nausea       . REMERON 45 MG tablet TAKE (1) TABLET BY MOUTH AT BEDTIME.  30 each  6  . WELLBUTRIN XL 150 MG 24 hr tablet TAKE ONE TABLET DAILY.  30 each  4  . lamoTRIgine (LAMICTAL) 200 MG tablet 1/4 tab po bid (5054mid)  10 tablet  0  . Vitamin D, Ergocalciferol, (DRISDOL) 50000 UNITS CAPS TAKE 1 CAPSULE THE SAME  DAY EACH WEEK.  4 capsule  6  . traMADol (ULTRAM) 50 MG tablet Take 1 tab two times a day and at bedtime as directed         PE: Blood pressure 118/78, pulse 127, temperature 98.4 F (36.9 C), temperature source Oral, weight 166 lb (75.297 kg), SpO2 96.00%. Gen: Alert, sad affect, crying intermittently.  NAD.  Patient  is oriented to person, place, time, and situation.  Lucid thought and conversation demonstrated. Chest: symmetric expansion, nonlabored respirations.  Clear and equal breath sounds in all lung fields.   CV: Regular, tachycardic (120s), no m/r/g.  Peripheral pulses 2+ and symmetric.    IMPRESSION AND PLAN:  Grief reaction Appropriate.  Handling things pretty well considering such a high level of underlying mood/anxiety disturbance at baseline. Continue all current psych meds as is.  DIAB W/O MENTION COMP TYPE II/UNS TYPE UNCNTRL Last HbA1c was up a few wks ago and we started actos 72m. I told her to take 2 of these per day, continue metformin 1005mbid as well. She's due for routine ophtho f/u soon, also needs foot exam next DM 2 visit.  Cervical pain DDD with radiculopathy, abnl MRI c spine 09/2010, has appt with Dr. RoCarloyn Mannern about a month-5 wks. I did encourage her to restart PT, continue current pain meds, rx  renewed today.   Flu vaccine given IM today.  FOLLOW UP:  Return in about 6 weeks (around 12/14/2010) for f/u dm, chronic neck pain, and anxiety/depression.

## 2010-11-02 NOTE — Assessment & Plan Note (Addendum)
Last HbA1c was up a few wks ago and we started actos 13m. I told her to take 2 of these per day, continue metformin 10034mbid as well. She's due for routine ophtho f/u soon, also needs foot exam next DM 2 visit.

## 2010-11-02 NOTE — Assessment & Plan Note (Signed)
Appropriate.  Handling things pretty well considering such a high level of underlying mood/anxiety disturbance at baseline. Continue all current psych meds as is.

## 2010-11-02 NOTE — Assessment & Plan Note (Signed)
DDD with radiculopathy, abnl MRI c spine 09/2010, has appt with Dr. Carloyn Manner in about a month-5 wks. I did encourage her to restart PT, continue current pain meds, rx renewed today.

## 2010-11-19 ENCOUNTER — Other Ambulatory Visit: Payer: Self-pay | Admitting: Family Medicine

## 2010-11-19 NOTE — Telephone Encounter (Signed)
Pt is not completely out of medications.  Advised pt Dr. Anitra Lauth out of office today and we can call in tomorrow.  Pt states she is in town today and wonders if they can be sent in.  Advised I can send a note to Dr. Charlett Blake, but she is seeing patients and may not look at until later in the day.  Pt OK with having filled tomorrow. 2nd issue, pt states she is still having a hard time coping with stepson's death.  She was on xanax prior and wonders if she can have something to help her get past this.  She states some days it is a struggle to get off the couch.  Pt is very tearful on phone today.  Please advise.  Pt was scheduled for follow up on 11/2 and has not rescheduled.

## 2010-11-20 NOTE — Telephone Encounter (Signed)
RX faxed to [place].

## 2010-12-13 ENCOUNTER — Encounter: Payer: Self-pay | Admitting: Family Medicine

## 2010-12-13 ENCOUNTER — Ambulatory Visit: Payer: Medicare PPO | Admitting: Family Medicine

## 2010-12-13 ENCOUNTER — Ambulatory Visit (INDEPENDENT_AMBULATORY_CARE_PROVIDER_SITE_OTHER): Payer: Medicare PPO | Admitting: Family Medicine

## 2010-12-13 DIAGNOSIS — F17200 Nicotine dependence, unspecified, uncomplicated: Secondary | ICD-10-CM

## 2010-12-13 DIAGNOSIS — Z13820 Encounter for screening for osteoporosis: Secondary | ICD-10-CM

## 2010-12-13 DIAGNOSIS — F4321 Adjustment disorder with depressed mood: Secondary | ICD-10-CM

## 2010-12-13 DIAGNOSIS — K909 Intestinal malabsorption, unspecified: Secondary | ICD-10-CM

## 2010-12-13 DIAGNOSIS — M8580 Other specified disorders of bone density and structure, unspecified site: Secondary | ICD-10-CM

## 2010-12-13 DIAGNOSIS — M542 Cervicalgia: Secondary | ICD-10-CM

## 2010-12-13 DIAGNOSIS — IMO0001 Reserved for inherently not codable concepts without codable children: Secondary | ICD-10-CM

## 2010-12-13 HISTORY — DX: Other specified disorders of bone density and structure, unspecified site: M85.80

## 2010-12-13 MED ORDER — OXYCODONE HCL 15 MG PO TABS: 15.0000 mg | ORAL_TABLET | Freq: Four times a day (QID) | ORAL | Status: DC | PRN

## 2010-12-17 DIAGNOSIS — Z13820 Encounter for screening for osteoporosis: Secondary | ICD-10-CM | POA: Insufficient documentation

## 2010-12-17 NOTE — Assessment & Plan Note (Signed)
Problem stable.  Continue current medications and diet appropriate for this condition.  We have reviewed our general long term plan for this problem and also reviewed symptoms and signs that should prompt the patient to call or return to the office.  

## 2010-12-17 NOTE — Assessment & Plan Note (Signed)
Noncompliant with monitoring. Due for HbA1c next f/u in 5 wks. Continue current diet/meds. Foot exam done today: normal. Microalb/cr UTD. Eye exam due (last was 10/2009).

## 2010-12-17 NOTE — Progress Notes (Signed)
OFFICE VISIT  12/17/2010   CC:  Chief Complaint  Patient presents with  . Follow-up    DM, neck pian, anxiety/depression     HPI:    Patient is a 55 y.o. Caucasian female who presents for f/u DM 2, chronic neck pain, grief reaction. Grief from recent suicide of stepson is slightly lessened.  She is smoking more than usual b/c of stress/depressed mood associated with this situation.   Not monitoring sugars but she is taking 66m actos daily and 10079mmetformin bid with 100% compliance.   No tingling, numbness, burning, or weakness in feet/lower legs, or hands.  Neck pain unchanged, still requiring oxycodone.  Saw Dr. RoCarloyn Mannerneurosurgeon, plan is for epidural steroid injection by one of his associates, Dr. JoMarolyn Hammock Past Medical History  Diagnosis Date  . Diabetes mellitus     urine protein-Cr ratio nl 09/2010, D.R. Screen neg 10/2009  . Obesity     s/p bariatric surgery  . Hepatic steatosis 2005    ultrasound  . Bipolar disorder     questionable  . Restless legs syndrome   . Insomnia   . Neurosis, anxiety, panic type   . Chronic fatigue   . Iron deficiency anemia 09/2009    malabsorbtion s/p bariatric surgery  . Vitamin D deficiency 09/2009  . Other B-complex deficiencies   . Nephrolithiasis     Past Surgical History  Procedure Date  . Roux-en-y gastric bypass   . Tonsillectomy   . Abdominal hysterectomy     fibroids, ovaries remain  . Bladder surgery     bladder tack  . Appendectomy   . Hernia repair 2005    mesenteric hernia repair, with lysis of adhesions (presented with SBO)  . Cholecystectomy 04/2010    w/lysis of adhesions (open procedure)--path showed chronic cholecystitis and cholesterol polyp.    Outpatient Prescriptions Prior to Visit  Medication Sig Dispense Refill  . aspirin 81 MG tablet Take 81 mg by mouth daily.        . Marland Kitchentorvastatin (LIPITOR) 20 MG tablet Take 20 mg by mouth daily.        . calcium elemental as carbonate (TUMS ULTRA 1000) 400 MG  tablet Chew 1,000 mg by mouth daily.        . cyanocobalamin (,VITAMIN B-12,) 1000 MCG/ML injection One injection (42m34mIM every 2 wks  10 mL  2  . ferrous sulfate 325 (65 FE) MG tablet Take 325 mg by mouth daily.        . GMarland KitchenUCOPHAGE 1000 MG tablet TAKE (1) TABLET BY MOUTH TWICE DAILY.  60 each  4  . KLONOPIN 2 MG tablet TAKE (1) TABLET BY MOUTH (3) TIMES DAILY.  90 each  2  . lamoTRIgine (LAMICTAL) 200 MG tablet 1/2 tab po bid (100m7md)  10 tablet  0  . Multiple Vitamin (MULTIVITAMIN) tablet Take 1 tablet by mouth daily.        . ONGlory RosebushICA LANCETS MISC Check glucose bid  60 each  6  . pioglitazone (ACTOS) 15 MG tablet Take 15 mg by mouth daily.        . pramipexole (MIRAPEX) 0.125 MG tablet Take 3-4 tablets by mouth at bedtime.  May take up to 5 tablets.  120 tablet  6  . promethazine (PHENERGAN) 12.5 MG tablet Take 1-2 tablet every 6 hours as needed for nausea       . REMERON 45 MG tablet TAKE (1) TABLET BY MOUTH AT BEDTIME.  30 each  6  . Vitamin D, Ergocalciferol, (DRISDOL) 50000 UNITS CAPS 1 cap po q week  4 capsule  6  . WELLBUTRIN XL 150 MG 24 hr tablet TAKE ONE TABLET DAILY.  30 each  4  . oxyCODONE (ROXICODONE) 15 MG immediate release tablet Take 1 tablet (15 mg total) by mouth every 6 (six) hours as needed for pain.  120 tablet  0  . Hydrocodone-Acetaminophen 10-660 MG TABS TAKE (1) TABLET BY MOUTH EVERY (6) HOURS AS NEEDEDFOR PAIN  60 each  0    Allergies  Allergen Reactions  . Codeine     REACTION: itching  . Latex   . Penicillins     REACTION: took large quantities as a child and was told to never take again    ROS As per HPI  PE: Blood pressure 101/71, pulse 91, temperature 98.3 F (36.8 C), temperature source Oral, height 5' 5"  (1.651 m), weight 169 lb (76.658 kg), SpO2 94.00%. Gen: Alert, well appearing.  Patient is oriented to person, place, time, and situation. FEET: no edema, no skin breakdown, no calluses, no erythema.  Sensation intact to monofilament  testing.  Full diabetic foot exam done/documented today.  LABS:  None today  IMPRESSION AND PLAN:  Grief reaction Problem stable.  Continue current medications and diet appropriate for this condition.  We have reviewed our general long term plan for this problem and also reviewed symptoms and signs that should prompt the patient to call or return to the office.   DIAB W/O MENTION COMP TYPE II/UNS TYPE UNCNTRL Noncompliant with monitoring. Due for HbA1c next f/u in 5 wks. Continue current diet/meds. Foot exam done today: normal. Microalb/cr UTD. Eye exam due (last was 10/2009).  Cervical pain Pain control ok with oxycodone, rx given today. Recent rx for hydrocodone as per her request was a mistake---she looked at a previous bottle when calling to request rf--she'll keep this med in cabinet/secured and save for when we ween her down off of oxycodone at some point. She'll proceed with cervical spine epidural injection as per neurosurgery recommendations.  Osteoporosis screening High risk: smoker, hx of vit D def, inadequate physical activity. Set up bone densitometry testing via [facility].     FOLLOW UP: Return in about 5 weeks (around 01/17/2011) for f/u DM 2, neck pain, depression.

## 2010-12-17 NOTE — Assessment & Plan Note (Signed)
High risk: smoker, hx of vit D def, inadequate physical activity. Set up bone densitometry testing via [facility].

## 2010-12-17 NOTE — Assessment & Plan Note (Signed)
Pain control ok with oxycodone, rx given today. Recent rx for hydrocodone as per her request was a mistake---she looked at a previous bottle when calling to request rf--she'll keep this med in cabinet/secured and save for when we ween her down off of oxycodone at some point. She'll proceed with cervical spine epidural injection as per neurosurgery recommendations.

## 2010-12-20 ENCOUNTER — Ambulatory Visit (HOSPITAL_COMMUNITY)
Admission: RE | Admit: 2010-12-20 | Discharge: 2010-12-20 | Disposition: A | Discharge status: Home / Self Care | Payer: Medicare PPO | Source: Ambulatory Visit | Attending: Family Medicine | Admitting: Family Medicine

## 2010-12-20 DIAGNOSIS — K909 Intestinal malabsorption, unspecified: Secondary | ICD-10-CM

## 2010-12-20 DIAGNOSIS — Z13820 Encounter for screening for osteoporosis: Secondary | ICD-10-CM | POA: Insufficient documentation

## 2010-12-20 DIAGNOSIS — F17200 Nicotine dependence, unspecified, uncomplicated: Secondary | ICD-10-CM

## 2010-12-20 DIAGNOSIS — M899 Disorder of bone, unspecified: Secondary | ICD-10-CM | POA: Insufficient documentation

## 2010-12-20 DIAGNOSIS — Z780 Asymptomatic menopausal state: Secondary | ICD-10-CM | POA: Insufficient documentation

## 2010-12-20 DIAGNOSIS — M949 Disorder of cartilage, unspecified: Secondary | ICD-10-CM | POA: Insufficient documentation

## 2010-12-31 ENCOUNTER — Encounter: Payer: Self-pay | Admitting: Family Medicine

## 2011-01-02 ENCOUNTER — Telehealth: Payer: Self-pay | Admitting: *Deleted

## 2011-01-02 NOTE — Telephone Encounter (Signed)
Pt will begin steroid pain injections on Tuesday 11/27.  Pt is concerned that she can not get her fasting sugars less than 120.  I can not get the patient to tell me where her numbers generally range.  She does state they can jump from 120 to anywhere in the 300's.  Pt worried about what sugars may do during injections.  Pt wants to know if she needs insulin or any different meds.  Advised Dr. Anitra Lauth out of office until Friday, but Dr. Charlett Blake is here today.  Pt states she will wait until Friday.

## 2011-01-03 NOTE — Telephone Encounter (Signed)
Yes, she needs to start some insulin.  She can add 10 units of levemir or lantus every night and continue her current oral diabetes meds.  Continue to check glucose every morning (goal 100-110 fasting sugar) and check glucose 2 hours after her lunch or supper meal.   She can come by and pick up samples or I can do eRx.  Let me know--PM

## 2011-01-04 NOTE — Telephone Encounter (Signed)
Pt is going to come by this afternoon to pick up samples. Patient advised that we are only open till 3 pm today

## 2011-01-04 NOTE — Telephone Encounter (Signed)
Pt came by to pick up Diana [name] and was told to do 10 units qhs

## 2011-01-10 ENCOUNTER — Encounter: Payer: Self-pay | Admitting: Family Medicine

## 2011-01-15 ENCOUNTER — Telehealth: Payer: Self-pay | Admitting: Family Medicine

## 2011-01-15 NOTE — Telephone Encounter (Signed)
I have attempted to contact this patient by phone with the following results: left message to return my call on answering machine (home).

## 2011-01-15 NOTE — Telephone Encounter (Signed)
Patient needs one box of the tips

## 2011-01-16 MED ORDER — NOVOFINE 32G X 6 MM MISC: Status: DC

## 2011-01-16 NOTE — Telephone Encounter (Signed)
2nd call to pt.  Notified RX sent.  Pt appreciative.  She does not need refill on Levemir at this time.  She also states she has found a therapist at [facility] that takes her insurance and when she has all the information about him, she will let us know to do the referral.

## 2011-01-16 NOTE — Telephone Encounter (Signed)
RX sent

## 2011-01-17 ENCOUNTER — Ambulatory Visit (HOSPITAL_COMMUNITY)
Admission: RE | Admit: 2011-01-17 | Discharge: 2011-01-17 | Disposition: A | Discharge status: Home / Self Care | Payer: Medicare PPO | Source: Ambulatory Visit | Attending: Anesthesiology | Admitting: Anesthesiology

## 2011-01-17 DIAGNOSIS — M542 Cervicalgia: Secondary | ICD-10-CM | POA: Insufficient documentation

## 2011-01-17 DIAGNOSIS — M6281 Muscle weakness (generalized): Secondary | ICD-10-CM | POA: Insufficient documentation

## 2011-01-17 DIAGNOSIS — E119 Type 2 diabetes mellitus without complications: Secondary | ICD-10-CM | POA: Insufficient documentation

## 2011-01-17 DIAGNOSIS — IMO0001 Reserved for inherently not codable concepts without codable children: Secondary | ICD-10-CM | POA: Insufficient documentation

## 2011-01-17 NOTE — Telephone Encounter (Signed)
Noted-PM 

## 2011-01-24 ENCOUNTER — Ambulatory Visit: Payer: Medicare PPO | Admitting: Family Medicine

## 2011-01-31 ENCOUNTER — Ambulatory Visit (INDEPENDENT_AMBULATORY_CARE_PROVIDER_SITE_OTHER): Payer: Medicare PPO | Admitting: Family Medicine

## 2011-01-31 ENCOUNTER — Encounter: Payer: Self-pay | Admitting: Family Medicine

## 2011-01-31 DIAGNOSIS — F4321 Adjustment disorder with depressed mood: Secondary | ICD-10-CM

## 2011-01-31 DIAGNOSIS — F3289 Other specified depressive episodes: Secondary | ICD-10-CM

## 2011-01-31 DIAGNOSIS — F329 Major depressive disorder, single episode, unspecified: Secondary | ICD-10-CM

## 2011-01-31 DIAGNOSIS — E119 Type 2 diabetes mellitus without complications: Secondary | ICD-10-CM

## 2011-01-31 DIAGNOSIS — E785 Hyperlipidemia, unspecified: Secondary | ICD-10-CM

## 2011-01-31 DIAGNOSIS — F319 Bipolar disorder, unspecified: Secondary | ICD-10-CM

## 2011-01-31 DIAGNOSIS — M542 Cervicalgia: Secondary | ICD-10-CM

## 2011-01-31 DIAGNOSIS — IMO0001 Reserved for inherently not codable concepts without codable children: Secondary | ICD-10-CM

## 2011-01-31 LAB — COMPREHENSIVE METABOLIC PANEL
ALT: 25 U/L (ref 0–35)
AST: 24 U/L (ref 0–37)
Albumin: 4.4 g/dL (ref 3.5–5.2)
BUN: 9 mg/dL (ref 6–23)
CO2: 28 mEq/L (ref 19–32)
Calcium: 9.6 mg/dL (ref 8.4–10.5)
Chloride: 104 mEq/L (ref 96–112)
Creat: 0.78 mg/dL (ref 0.50–1.10)
Potassium: 4.2 mEq/L (ref 3.5–5.3)

## 2011-01-31 LAB — HEMOGLOBIN A1C: Hgb A1c MFr Bld: 7.5 % — ABNORMAL HIGH (ref <5.7)

## 2011-01-31 MED ORDER — OXYCODONE HCL 15 MG PO TABS: 15.0000 mg | ORAL_TABLET | Freq: Four times a day (QID) | ORAL | Status: DC | PRN

## 2011-01-31 MED ORDER — OXYCODONE HCL 20 MG PO TB12: 20.0000 mg | ORAL_TABLET | Freq: Two times a day (BID) | ORAL | Status: AC

## 2011-01-31 NOTE — Progress Notes (Signed)
OFFICE VISIT  02/03/2011   CC:  Chief Complaint  Patient presents with  . Follow-up    diabetes, neck pain, depression     HPI:    Patient is a 54 y.o. Caucasian female who presents for f/u DM 2, depression, hyperlipidemia, chronic cervical radiculopathy pain. Has received one epidural injection since our last visit and it helped minimally for 1-2 wks max.  Still requiring oxycodone 26m q4h, says it doen't even last that long before her pain is back up to 7-8/10 intensity.  Has neurosurg f/u set, another epidural injection will be done 02/15/11.    Checking fasting gluc regularly, gets 115 avg, frequently checks it later in the day (2H PP) and often it is near 200.   Adherent to low chol/low fat diet fairly regularly.  Mood still down, good days and bad days since suicide of her stepson recently.  [facility] [place]  ROS: no recent vision changes, no tingling/burning/numbness in feet.  No SOB, no CP. No suicidal thinking or homicidal thinking.  We reviewed result of recent bone density testing: osteopenia.  Continue 1000-1500 mg calcium and she'll continue her high dose vit D (hx of malabsorption/vit D deficiency).  Repeat bone density testing in 2 yrs.  She is still smoking some, trying to cut back/quit.  Past Medical History  Diagnosis Date  . Diabetes mellitus     urine protein-Cr ratio nl 09/2010, D.R. Screen neg 10/2009  . Obesity     s/p bariatric surgery  . Hepatic steatosis 2005    ultrasound  . Bipolar disorder     questionable  . Restless legs syndrome   . Insomnia   . Neurosis, anxiety, panic type   . Chronic fatigue   . Iron deficiency anemia 09/2009    malabsorbtion s/p bariatric surgery  . Vitamin D deficiency 09/2009  . Other B-complex deficiencies   . Nephrolithiasis   . Osteopenia 12/2010    Hip T score -2.0.  Spine T score -0.5 (plan to repeat in 2 yrs)    Past Surgical History  Procedure Date  .  Roux-en-y gastric bypass   . Tonsillectomy   . Abdominal hysterectomy     fibroids, ovaries remain  . Bladder surgery     bladder tack  . Appendectomy   . Hernia repair 2005    mesenteric hernia repair, with lysis of adhesions (presented with SBO)  . Cholecystectomy 04/2010    w/lysis of adhesions (open procedure)--path showed chronic cholecystitis and cholesterol polyp.   MEDS: not taking actos as listed below (cost too high, ran out of samples) Outpatient Prescriptions Prior to Visit  Medication Sig Dispense Refill  . aspirin 81 MG tablet Take 81 mg by mouth daily.        .Marland Kitchenatorvastatin (LIPITOR) 20 MG tablet Take 20 mg by mouth daily.        . calcium elemental as carbonate (TUMS ULTRA 1000) 400 MG tablet Chew 1,000 mg by mouth daily.        . cyanocobalamin (,VITAMIN B-12,) 1000 MCG/ML injection One injection (172m IM every 2 wks  10 mL  2  . ferrous sulfate 325 (65 FE) MG tablet Take 325 mg by mouth daily.        . Marland KitchenLUCOPHAGE 1000 MG tablet TAKE (1) TABLET BY MOUTH TWICE DAILY.  60 each  4  . insulin detemir (LEVEMIR FLEXPEN) 100 UNIT/ML injection Inject 10 Units into the skin at  bedtime.        Marland Kitchen KLONOPIN 2 MG tablet TAKE (1) TABLET BY MOUTH (3) TIMES DAILY.  90 each  2  . lamoTRIgine (LAMICTAL) 200 MG tablet 1/2 tab po bid (179m bid)  10 tablet  0  . Multiple Vitamin (MULTIVITAMIN) tablet Take 1 tablet by mouth daily.        .Marland KitchenNOVOFINE 32G X 6 MM MISC Use as directed.  DX 250.00  100 each  1  . ONETOUCH DELICA LANCETS MISC Check glucose bid  60 each  6  . pramipexole (MIRAPEX) 0.125 MG tablet Take 3-4 tablets by mouth at bedtime.  May take up to 5 tablets.  120 tablet  6  . promethazine (PHENERGAN) 12.5 MG tablet Take 1-2 tablet every 6 hours as needed for nausea       . REMERON 45 MG tablet TAKE (1) TABLET BY MOUTH AT BEDTIME.  30 each  6  . Vitamin D, Ergocalciferol, (DRISDOL) 50000 UNITS CAPS 1 cap po q week  4 capsule  6  . WELLBUTRIN XL 150 MG 24 hr tablet TAKE ONE TABLET  DAILY.  30 each  4  . oxyCODONE (ROXICODONE) 15 MG immediate release tablet Take 1 tablet (15 mg total) by mouth every 6 (six) hours as needed for pain.  120 tablet  0  . pioglitazone (ACTOS) 15 MG tablet Take 15 mg by mouth daily.          Allergies  Allergen Reactions  . Codeine     REACTION: itching  . Latex   . Penicillins     REACTION: took large quantities as a child and was told to never take again    ROS As per HPI  PE: Blood pressure 105/72, pulse 110, temperature 98.4 F (36.9 C), temperature source Oral, height 5' 5"  (1.651 m), weight 172 lb 12.8 oz (78.382 kg), SpO2 94.00%. Gen: Alert, well appearing.  Patient is oriented to person, place, time, and situation. No further exam today.  LABS:  None today.  IMPRESSION AND PLAN:  DIAB W/O MENTION COMP TYPE II/UNS TYPE UNCNTRL Continue Levemir at 10 U qhs for now, adjust based on HbA1c. Sounds like she may need mealtime insulin at least at her largest meal.   Check HbA1c, She is arranging her diab retinpthy screeing eye exam. Foot exam UTD (normal 12/13/10). F/u 375mo Cervical pain DDD/sponylosis with chronic pain with radiculopathy. Continue management as per neurosurgery (injection #2 coming up 02/15/11).   Since her pain is poorly controlled, I'll start long-acting narcotic (oxycontin 2057mo bid, #60, no RF), and she'll use her 58m40mycodone tabs for breakthrough pain prn. Therapeutic expectations and side effect profile of medication discussed today.  Patient's questions answered. If she stays on chronic narcotic pain mgmt after the next 58mo,23mol have her sign pain contract, and give her the option of pain mgmt MD referral.   DEPRESSION, RECURRENT With superimposed grief reaction lately as a result of step son's suicide. Improving some, but [facility] [place] her other step son already sees a provider there; Dr. John Ilean SkillD).  Number provided by patient:  [phone] for referral done today. Continue current meds.  HYPERLIPIDEMIA Lab Results  Component Value Date   CHOL 183 08/27/2010   HDL 40 08/27/2010   LDLCALC 101* 08/27/2010   TRIG 209* 08/27/2010   CHOLHDL 4.6 08/27/2010   No lipid panel today. Will check AST/ALT, lytes.  Tob dependence: encouraged cessation.  FOLLOW UP: Return in about 1 month (around 03/03/2011) for f/u pain, DM, depression.

## 2011-02-03 NOTE — Assessment & Plan Note (Signed)
Lab Results  Component Value Date   CHOL 183 08/27/2010   HDL 40 08/27/2010   LDLCALC 101* 08/27/2010   TRIG 209* 08/27/2010   CHOLHDL 4.6 08/27/2010   No lipid panel today. Will check AST/ALT, lytes.

## 2011-02-03 NOTE — Assessment & Plan Note (Addendum)
DDD/sponylosis with chronic pain with radiculopathy. Continue management as per neurosurgery (injection #2 coming up 02/15/11).   Since her pain is poorly controlled, I'll start long-acting narcotic (oxycontin 74m po bid, #60, no RF), and she'll use her 1585moxycodone tabs for breakthrough pain prn. Therapeutic expectations and side effect profile of medication discussed today.  Patient's questions answered. If she stays on chronic narcotic pain mgmt after the next 85m60moill have her sign pain contract, and give her the option of pain mgmt MD referral.

## 2011-02-03 NOTE — Assessment & Plan Note (Signed)
Continue Levemir at 10 U qhs for now, adjust based on HbA1c. Sounds like she may need mealtime insulin at least at her largest meal.   Check HbA1c, She is arranging her diab retinpthy screeing eye exam. Foot exam UTD (normal 12/13/10). F/u 4mo

## 2011-02-03 NOTE — Assessment & Plan Note (Signed)
With superimposed grief reaction lately as a result of step son's suicide. Improving some, but she asks for referral to [facility] in [place] (says her other step son already sees a provider there; Dr. Ilean Skill, PsyD).  Number provided by patient: [phone].  Order for referral done today. Continue current meds.

## 2011-02-13 ENCOUNTER — Ambulatory Visit (HOSPITAL_COMMUNITY): Payer: Medicare PPO | Admitting: Psychiatry

## 2011-02-18 ENCOUNTER — Ambulatory Visit (HOSPITAL_COMMUNITY): Payer: Medicare PPO | Admitting: Psychiatry

## 2011-02-25 ENCOUNTER — Other Ambulatory Visit: Payer: Self-pay | Admitting: Family Medicine

## 2011-02-25 NOTE — Telephone Encounter (Signed)
RX sent

## 2011-03-05 ENCOUNTER — Encounter: Payer: Self-pay | Admitting: Family Medicine

## 2011-03-05 ENCOUNTER — Ambulatory Visit (INDEPENDENT_AMBULATORY_CARE_PROVIDER_SITE_OTHER): Payer: Medicare PPO | Admitting: Family Medicine

## 2011-03-05 VITALS — BP 105/74 | HR 109 | Ht 65.0 in | Wt 168.0 lb

## 2011-03-05 DIAGNOSIS — K5900 Constipation, unspecified: Secondary | ICD-10-CM

## 2011-03-05 DIAGNOSIS — K5909 Other constipation: Secondary | ICD-10-CM | POA: Insufficient documentation

## 2011-03-05 DIAGNOSIS — IMO0001 Reserved for inherently not codable concepts without codable children: Secondary | ICD-10-CM

## 2011-03-05 DIAGNOSIS — F3289 Other specified depressive episodes: Secondary | ICD-10-CM

## 2011-03-05 DIAGNOSIS — M542 Cervicalgia: Secondary | ICD-10-CM

## 2011-03-05 DIAGNOSIS — F329 Major depressive disorder, single episode, unspecified: Secondary | ICD-10-CM

## 2011-03-05 DIAGNOSIS — Z1211 Encounter for screening for malignant neoplasm of colon: Secondary | ICD-10-CM

## 2011-03-05 MED ORDER — OXYCODONE HCL 30 MG PO TB12: 30.0000 mg | ORAL_TABLET | Freq: Two times a day (BID) | ORAL | Status: DC

## 2011-03-05 NOTE — Progress Notes (Signed)
OFFICE NOTE  03/06/2011  CC:  Chief Complaint  Patient presents with  . Follow-up    DM, depression, pain     HPI:   Patient is a 56 y.o. Caucasian female who is here for f/u for chronic cervical spine with radiculopathy--pain mgmt. Feels like her pain is minimally improved since being placed on oxycontin 35m bid a month ago.  Constipation much worse--has to use fleets suppositories lately.  Taking softener, OTC laxatives. C-spine injections not helping much but Neurosurgeon is sticking with the plan of injections instead of surgery for now.  He has referred her to a pain mgmt, Dr. OFrancesco Runner and he'll start managing her pain meds after her RFs today.   Depression fairly stable at this time. She brought her glucose log in today: fastings and 2h PPs still moderately high persistently--high 100s to high 200s.  Pertinent PMH:  C-spine DDD with spinal nerve compression DM 2, insulin-requiring Depression/bipolar d/o Hx of gastric bypass surgery Anxiety w/agoraphobia  MEDS;   Outpatient Prescriptions Prior to Visit  Medication Sig Dispense Refill  . aspirin 81 MG tablet Take 243 mg by mouth daily.       .Marland Kitchenatorvastatin (LIPITOR) 20 MG tablet Take 20 mg by mouth daily.        . calcium elemental as carbonate (TUMS ULTRA 1000) 400 MG tablet Chew 1,000 mg by mouth daily.        . cyanocobalamin (,VITAMIN B-12,) 1000 MCG/ML injection One injection (134m IM every 2 wks  10 mL  2  . cyclobenzaprine (FLEXERIL) 10 MG tablet Take 1 tablet (10 mg total) by mouth 3 (three) times daily as needed for muscle spasms.  30 tablet  1  . GLUCOPHAGE 1000 MG tablet TAKE (1) TABLET BY MOUTH TWICE DAILY.  60 each  4  . insulin detemir (LEVEMIR FLEXPEN) 100 UNIT/ML injection Inject 14 Units into the skin at bedtime.       . Marland KitchenLONOPIN 2 MG tablet TAKE (1) TABLET BY MOUTH (3) TIMES DAILY.  90 each  2  . Multiple Vitamin (MULTIVITAMIN) tablet Take 1 tablet by mouth daily.        . Marland KitchenOVOFINE 32G X 6 MM MISC Use as  directed.  DX 250.00  100 each  1  . ONE TOUCH ULTRA TEST test strip CHECK BLOOD SUGAR TWO TIMES DAILY AS DIRECTED.  100 each  5  . ONETOUCH DELICA LANCETS MISC Check glucose bid  60 each  6  . oxyCODONE (ROXICODONE) 15 MG immediate release tablet Take 1 tablet (15 mg total) by mouth every 6 (six) hours as needed for pain.  120 tablet  0  . pramipexole (MIRAPEX) 0.125 MG tablet Take 3-4 tablets by mouth at bedtime.  May take up to 5 tablets.  120 tablet  6  . promethazine (PHENERGAN) 12.5 MG tablet Take 1-2 tablet every 6 hours as needed for nausea       . REMERON 45 MG tablet TAKE (1) TABLET BY MOUTH AT BEDTIME.  30 each  6  . Vitamin D, Ergocalciferol, (DRISDOL) 50000 UNITS CAPS 1 cap po q week  4 capsule  6  . WELLBUTRIN XL 150 MG 24 hr tablet TAKE ONE TABLET DAILY.  30 each  4  . ferrous sulfate 325 (65 FE) MG tablet Take 325 mg by mouth daily.        . Marland KitchenamoTRIgine (LAMICTAL) 200 MG tablet 1/2 tab po bid (1002mid)  10 tablet  0    PE:  Blood pressure 105/74, pulse 109, height 5' 5"  (1.651 m), weight 168 lb (76.204 kg). Gen: Alert, well appearing.  Patient is oriented to person, place, time, and situation. No further exam today.  IMPRESSION AND PLAN:  Constipation, chronic Start Linzess qd, samples given today. Continue prn fleets suppositories.  Cervical pain Pt ok with oxycontin increase to 17m bid, #60, no RF.   Dr. OFrancesco Runner pain mgmt MD, will assume all pain mgmt needs/contract after this rx.   DIAB W/O MENTION COMP TYPE II/UNS TYPE UNCNTRL Lab Results  Component Value Date   HGBA1C 7.5* 01/31/2011   Discussed ongoing insulin titration: levemir to 14 units qhs and add 5 units of humalog at every meal now, not just largest meal of the day.  DEPRESSION, RECURRENT Gradually improving.  Continue current meds/counseling.     FOLLOW UP:  Return in about 2 months (around 05/03/2011) for f/u dm, depression, constipation.

## 2011-03-05 NOTE — Assessment & Plan Note (Signed)
Start Linzess qd, samples given today. Continue prn fleets suppositories.

## 2011-03-06 ENCOUNTER — Encounter: Payer: Self-pay | Admitting: Family Medicine

## 2011-03-06 ENCOUNTER — Encounter (HOSPITAL_COMMUNITY): Payer: Self-pay | Admitting: Psychiatry

## 2011-03-06 ENCOUNTER — Ambulatory Visit (INDEPENDENT_AMBULATORY_CARE_PROVIDER_SITE_OTHER): Payer: Medicare PPO | Admitting: Psychiatry

## 2011-03-06 DIAGNOSIS — Z1211 Encounter for screening for malignant neoplasm of colon: Secondary | ICD-10-CM

## 2011-03-06 DIAGNOSIS — F39 Unspecified mood [affective] disorder: Secondary | ICD-10-CM

## 2011-03-06 DIAGNOSIS — F411 Generalized anxiety disorder: Secondary | ICD-10-CM

## 2011-03-06 DIAGNOSIS — F419 Anxiety disorder, unspecified: Secondary | ICD-10-CM

## 2011-03-06 HISTORY — DX: Encounter for screening for malignant neoplasm of colon: Z12.11

## 2011-03-06 NOTE — Assessment & Plan Note (Signed)
Gradually improving.  Continue current meds/counseling.

## 2011-03-06 NOTE — Assessment & Plan Note (Signed)
Lab Results  Component Value Date   HGBA1C 7.5* 01/31/2011   Discussed ongoing insulin titration: levemir to 14 units qhs and add 5 units of humalog at every meal now, not just largest meal of the day.

## 2011-03-06 NOTE — Assessment & Plan Note (Signed)
Pt ok with oxycontin increase to 23m bid, #60, no RF.   Dr. OFrancesco Runner pain mgmt MD, will assume all pain mgmt needs/contract after this rx.
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
## 2011-03-13 ENCOUNTER — Other Ambulatory Visit: Payer: Self-pay | Admitting: Family Medicine

## 2011-03-15 HISTORY — PX: EPIDURAL BLOCK INJECTION: SHX1516

## 2011-03-15 NOTE — Telephone Encounter (Signed)
Last seen 03/05/11, follow up 05/03/11.  Glucophage sent.  Please advise Flexeril.

## 2011-03-20 ENCOUNTER — Encounter (HOSPITAL_COMMUNITY): Payer: Self-pay | Admitting: Psychiatry

## 2011-03-20 ENCOUNTER — Ambulatory Visit (INDEPENDENT_AMBULATORY_CARE_PROVIDER_SITE_OTHER): Payer: Medicare PPO | Admitting: Psychiatry

## 2011-03-20 DIAGNOSIS — F39 Unspecified mood [affective] disorder: Secondary | ICD-10-CM

## 2011-03-20 NOTE — Patient Instructions (Signed)
Discussed orally 
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
## 2011-04-05 ENCOUNTER — Ambulatory Visit (INDEPENDENT_AMBULATORY_CARE_PROVIDER_SITE_OTHER): Payer: Medicare PPO | Admitting: Psychiatry

## 2011-04-05 ENCOUNTER — Encounter (HOSPITAL_COMMUNITY): Payer: Self-pay | Admitting: Psychiatry

## 2011-04-05 DIAGNOSIS — F39 Unspecified mood [affective] disorder: Secondary | ICD-10-CM

## 2011-04-05 DIAGNOSIS — F411 Generalized anxiety disorder: Secondary | ICD-10-CM
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
## 2011-04-08 NOTE — Patient Instructions (Signed)
Discussed orally 

## 2011-04-09 ENCOUNTER — Encounter: Payer: Self-pay | Admitting: Family Medicine

## 2011-04-27 ENCOUNTER — Other Ambulatory Visit: Payer: Self-pay | Admitting: Family Medicine

## 2011-04-29 ENCOUNTER — Ambulatory Visit (HOSPITAL_COMMUNITY): Payer: Medicare PPO | Admitting: Psychiatry

## 2011-04-29 MED ORDER — CLONAZEPAM 2 MG PO TABS: 2.0000 mg | ORAL_TABLET | Freq: Three times a day (TID) | ORAL | Status: DC

## 2011-04-29 NOTE — Telephone Encounter (Signed)
eScribe request for refill on Novofine needles Last seen on 03/05/11 Follow up 05/03/11 RX sent  eScribe request for refill on Wellbutrin Last seen on 03/05/11 Follow up 05/03/11 RX sent  eScribe request for refill on lipitor Last seen on 03/05/11 Follow up 05/03/11 RX sent  eScribe request for refill on Klonopin Last seen on 03/05/11 Follow up 05/03/11 Last filled 11/20/10, #90 x 2 Please advise refills.

## 2011-04-29 NOTE — Telephone Encounter (Signed)
RX sent

## 2011-04-29 NOTE — Telephone Encounter (Signed)
Printed RX signed in error by Dr. Charlett Blake.  RX re-printed.

## 2011-04-29 NOTE — Telephone Encounter (Signed)
Addended by: Graylon Good on: 04/29/2011 02:41 PM   Modules accepted: Orders

## 2011-04-30 ENCOUNTER — Ambulatory Visit (HOSPITAL_COMMUNITY): Payer: Medicare PPO | Admitting: Psychiatry

## 2011-05-03 ENCOUNTER — Ambulatory Visit (INDEPENDENT_AMBULATORY_CARE_PROVIDER_SITE_OTHER): Payer: Medicare PPO | Admitting: Family Medicine

## 2011-05-03 ENCOUNTER — Encounter: Payer: Self-pay | Admitting: Family Medicine

## 2011-05-03 VITALS — BP 133/84 | HR 105 | Temp 97.6°F | Ht 65.0 in | Wt 165.0 lb

## 2011-05-03 DIAGNOSIS — IMO0001 Reserved for inherently not codable concepts without codable children: Secondary | ICD-10-CM

## 2011-05-03 DIAGNOSIS — M542 Cervicalgia: Secondary | ICD-10-CM

## 2011-05-03 DIAGNOSIS — K5900 Constipation, unspecified: Secondary | ICD-10-CM

## 2011-05-03 DIAGNOSIS — R5381 Other malaise: Secondary | ICD-10-CM

## 2011-05-03 DIAGNOSIS — R5383 Other fatigue: Secondary | ICD-10-CM

## 2011-05-03 DIAGNOSIS — E119 Type 2 diabetes mellitus without complications: Secondary | ICD-10-CM

## 2011-05-03 DIAGNOSIS — F329 Major depressive disorder, single episode, unspecified: Secondary | ICD-10-CM

## 2011-05-03 DIAGNOSIS — K5909 Other constipation: Secondary | ICD-10-CM

## 2011-05-03 DIAGNOSIS — F3289 Other specified depressive episodes: Secondary | ICD-10-CM

## 2011-05-03 MED ORDER — LISDEXAMFETAMINE DIMESYLATE 20 MG PO CAPS: 20.0000 mg | ORAL_CAPSULE | ORAL | Status: DC

## 2011-05-03 NOTE — Progress Notes (Signed)
OFFICE NOTE  05/07/2011  CC:  Chief Complaint  Patient presents with  . Follow-up     HPI: Patient is a 56 y.o. Caucasian female who is here for f/u DM 2, mood disorder with recent grief rxn. 1) Gluc's up since getting steroid epidurals in c-spine x 3 lately.  Pt hasn't adjusted insulins any. 2) C-spine radiculopathy pain about 25% improved with epidural injections and pain med mgmt by a team of neurologists.  She says she may not go back to her neurosurgeon. 3) Depression/anxiety still bad but she's trying to stay hopeful.  She has seen a counselor 3 times in the last couple of months and she does plan on continuing this for now. 4) Constipation remains--worse with narcotics, says she was "too scared" to try the Linzess samples I gave her after she read the packet insert.    Pertinent PMH:  Past Medical History  Diagnosis Date  . Diabetes mellitus     urine protein-Cr ratio nl 09/2010, D.R. Screen neg 10/2009  . Obesity     s/p bariatric surgery  . Hepatic steatosis 2005    ultrasound  . Bipolar disorder     questionable  . Restless legs syndrome   . Insomnia   . Neurosis, anxiety, panic type   . Chronic fatigue   . Iron deficiency anemia 09/2009    malabsorbtion s/p bariatric surgery  . Vitamin d deficiency 09/2009  . Other B-complex deficiencies   . Nephrolithiasis   . Osteopenia 12/2010    Hip T score -2.0.  Spine T score -0.5 (plan to repeat in 2 yrs)   Past surgical, social, and family history reviewed and no changes noted since last office visit.  MEDS:  Outpatient Prescriptions Prior to Visit  Medication Sig Dispense Refill  . calcium elemental as carbonate (TUMS ULTRA 1000) 400 MG tablet Chew 1,000 mg by mouth daily.        . clonazePAM (KLONOPIN) 2 MG tablet Take 1 tablet (2 mg total) by mouth 3 (three) times daily.  90 tablet  2  . cyanocobalamin (,VITAMIN B-12,) 1000 MCG/ML injection One injection (63m) IM every 2 wks  10 mL  2  . GLUCOPHAGE 1000 MG tablet  TAKE (1) TABLET BY MOUTH TWICE DAILY.  60 each  2  . Insulin Aspart (NOVOLOG FLEXPEN Oliver) Inject 5 Units into the skin 3 (three) times daily. With each meal      . insulin detemir (LEVEMIR FLEXPEN) 100 UNIT/ML injection Inject 14 Units into the skin at bedtime.       .Marland KitchenLIPITOR 20 MG tablet TAKE ONE TABLET DAILY.  30 each  11  . Multiple Vitamin (MULTIVITAMIN) tablet Take 1 tablet by mouth daily.        .Marland KitchenNOVOFINE 32G X 6 MM MISC USE AS DIRECTED.  100 each  2  . ONE TOUCH ULTRA TEST test strip CHECK BLOOD SUGAR TWO TIMES DAILY AS DIRECTED.  100 each  5  . ONETOUCH DELICA LANCETS MISC Check glucose bid  60 each  6  . pramipexole (MIRAPEX) 0.125 MG tablet Take 3-4 tablets by mouth at bedtime.  May take up to 5 tablets.  120 tablet  6  . promethazine (PHENERGAN) 12.5 MG tablet Take 1-2 tablet every 6 hours as needed for nausea       . REMERON 45 MG tablet TAKE (1) TABLET BY MOUTH AT BEDTIME.  30 each  6  . Vitamin D, Ergocalciferol, (DRISDOL) 50000 UNITS CAPS 1 cap  po q week  4 capsule  6  . WELLBUTRIN XL 150 MG 24 hr tablet TAKE ONE TABLET DAILY.  30 each  4  . oxycodone (OXYCONTIN) 30 MG TB12 Take 1 tablet (30 mg total) by mouth every 12 (twelve) hours.  60 tablet  0  . ferrous sulfate 325 (65 FE) MG tablet Take 325 mg by mouth daily.        . Linaclotide (LINZESS) 145 MCG CAPS Take 1 capsule by mouth daily.      Marland Kitchen oxyCODONE (ROXICODONE) 15 MG immediate release tablet Take 1 tablet (15 mg total) by mouth every 6 (six) hours as needed for pain.  120 tablet  0  . aspirin 81 MG tablet Take 243 mg by mouth daily.       Marland Kitchen FLEXERIL 10 MG tablet TAKE 1 TABLET 3 TIMES DAILY AS NEEDED FOR MUSCLE SPASMS.  30 each  3  . lamoTRIgine (LAMICTAL) 200 MG tablet 1/2 tab po bid (1616m bid)  10 tablet  0    PE: Blood pressure 133/84, pulse 105, temperature 97.6 F (36.4 C), temperature source Temporal, height 5' 5"  (1.651 m), weight 165 lb (74.844 kg). Gen: Alert, well appearing.  Patient is oriented to person,  place, time, and situation. She cried very little during our interview today.  No further exam.  IMPRESSION AND PLAN:  FATIGUE + mood disorder. Decided on trial of vyvanse to augment her other psychotropic meds.  Pt aware of off label use of this med in this case. Vyvanse 29mqd, try for 1 week at this dose and then increase to 2 qAM if needed.  Therapeutic expectations and side effect profile of medication discussed today.  Patient's questions answered.   Cervical pain Improving a bit with epidural injections.  Continue with plan of care as per her neurologists and neurosurgeon (Dr. O'Francesco Runners managing her pain meds).   Constipation, chronic Once again, I encouraged her to try the samples of Linzess I gave her at her last visit. She says she will  DEPRESSION, RECURRENT Her grief reaction from her stepson's suicide has now lasted 16m444mod she has finally entered into a counseling relationship and it seems to be going fair.  Continue current meds + augmentation with vyvanse as noted above, gave emotional support/encouragement today.  DIAB W/O MENTION COMP TYPE II/UNS TYPE UNCNTRL Once again, encouraged her to SELOlmitor insulin when she gets into periods of elevated glucoses like she's been having (esp since epidural steroid injections).  Since she is apprehensive about this, I advised an increase of 1 unit of levemir each night until fasting glucose at goal range of 100-110 and increase of mealtime insulin by 1 unit until 2H PP in goal range of 140-170.    [facility] not [facility].  FOLLOW UP: [id number]

## 2011-05-07 ENCOUNTER — Telehealth: Payer: Self-pay | Admitting: Family Medicine

## 2011-05-07 ENCOUNTER — Encounter: Payer: Self-pay | Admitting: Family Medicine

## 2011-05-07 NOTE — Assessment & Plan Note (Signed)
+   mood disorder. Decided on trial of vyvanse to augment her other psychotropic meds.  Pt aware of off label use of this med in this case. Vyvanse 55m qd, try for 1 week at this dose and then increase to 2 qAM if needed.  Therapeutic expectations and side effect profile of medication discussed today.  Patient's questions answered.

## 2011-05-07 NOTE — Assessment & Plan Note (Signed)
Once again, encouraged her to [place] her insulin when she gets into periods of elevated glucoses like she's been having (esp since epidural steroid injections).  Since she is apprehensive about this, I advised an increase of 1 unit of levemir each night until fasting glucose at goal range of 100-110 and increase of mealtime insulin by 1 unit until 2H PP in goal range of 140-170.

## 2011-05-07 NOTE — Assessment & Plan Note (Signed)
Once again, I encouraged her to try the samples of Linzess I gave her at her last visit. She says she will

## 2011-05-07 NOTE — Assessment & Plan Note (Signed)
Her grief reaction from her stepson's suicide has now lasted 33moand she has finally entered into a counseling relationship and it seems to be going fair.  Continue current meds + augmentation with vyvanse as noted above, gave emotional support/encouragement today.

## 2011-05-07 NOTE — Assessment & Plan Note (Signed)
Improving a bit with epidural injections.  Continue with plan of care as per her neurologists and neurosurgeon (Dr. Francesco Runner is managing her pain meds).

## 2011-05-09 ENCOUNTER — Encounter: Payer: Self-pay | Admitting: Family Medicine

## 2011-05-09 NOTE — Telephone Encounter (Signed)
Prior auth started.  Form faxed.

## 2011-05-13 ENCOUNTER — Encounter (HOSPITAL_COMMUNITY): Payer: Self-pay | Admitting: Psychiatry

## 2011-05-13 ENCOUNTER — Ambulatory Visit (INDEPENDENT_AMBULATORY_CARE_PROVIDER_SITE_OTHER): Payer: Medicare PPO | Admitting: Psychiatry

## 2011-05-13 DIAGNOSIS — F411 Generalized anxiety disorder: Secondary | ICD-10-CM

## 2011-05-13 DIAGNOSIS — F39 Unspecified mood [affective] disorder: Secondary | ICD-10-CM

## 2011-05-13 NOTE — Progress Notes (Signed)
Patient:  Diana [name]   DOB: 07-05-1955  MR Number: [id number]  Location: [place]:  [address] [place],  [place], [address]  Start: Monday 05/13/2011 11:00 AM  End: Monday 05/13/2011 11:50 AM  Provider/Observer:     Maurice Small, MSW, LCSW   Chief Complaint:      Chief Complaint  Patient presents with  . Depression  . Anxiety    Reason For Service:     The patient was referred for services by primary care physician Dr. Julien Nordmann due to patient experiencing grief, depression, and panic attacks. The patient's 75 year old stepson hanged himself in her and her fianc's front yard on 9//2012. This is patient's fianc's son. She reports she had been involved in her stepson's life since he was 30 years old and had to be the authoritarian. She is experiencing guilt as she felt like she had to be the bad guy. The patient reports additional stress regarding her fianc's 52 year old son who is experiencing issues related to his brother suicide as well as issues with his biological mother who uses crack and has told him she may not be around much longer per patient's report. He currently is residing with patient and her fianc. The patient has a long-standing history of depression and anxiety and reports being diagnosed with bipolar disorder several years ago and has attended treatment in [place] as well as in [place] at the [facility]. Patient is seen for a follow up appointment.  Interventions Strategy:  Supportive therapy, grief therapy, cognitive behavioral therapy  Participation Level:   Active  Participation Quality:  Monopolizing      Behavioral Observation:  Casual, Alert, and Tearful.   Current Psychosocial Factors: The patient's 55- year-old stepson completed suicide in 10/2010.  The patient's 30- year- old stepson plans to visit his maternal grandparents overnight this week.  Content of Session:   Reviewing symptoms, identifying  stressors, processing grief and loss issues  Current Status:   The patient continues to experience depressed mood, anxiety, panic attacks, mood swings, sleep difficulty, excessive worry, memory difficulty, loss of interest in activities, low-energy, poor concentration, and guilt. She also reports dreams about her deceased stepson as well as visual images of his suicide.  Patient Progress:   Fair. The patient reports the Easter holiday has triggered increased thoughts about her deceased stepson. She reports fiancee still does not talk about his son except for in passing.  Patient reports staying in the bed most of yesterday.  She discusses how family celebrated [place] last year with her stepson. She continues to express guilt as she had to be the disciplinarian for her stepson and states that he hated her at times. She shares that he had told her one time that he would never leave home although she and his father were encouraging him to look for his own place. She states she remembered thinking that he was showing her that he would never leave home when she and her fiancee saw him hanging from the tree. She reports continued nightmares as well as recollections/flashbacks of seeing him hanging from the tree in her front yard. She is experiencing increased anxiety as her 65 year old stepson prepares to stay with his maternal grandparents for a few days.  She fears she will lose him and he will want to reside  with his grandparents. Therapist works with patient to identify thinking errors and effects on patient's mood and behavior. Patient  continues to  suffer significant anxiety regarding social involvement. She reports difficulty forcing herself to go out of her house to do errands. She fears people will notice her when she has a panic attack. Patient also reports continued stress related to continued back pain.  Target Goals:   1.Decreased intensity and frequency of panic attacks. 2. Decrease anxiety, excessive  worry, and flashbacks 3. Improve mood and reduce crying spells. 4.Increase social involvement and social interaction.  Last Reviewed:   03/20/2011  Goals Addressed Today:    Decrease anxiety and frequency of panic attacks  Impression/Diagnosis:   The patient presents with a 10+ year history of mood disturbances along with anxiety. Her symptoms have worsened since her stepson completed suicide in September 2012. The patient is experiencing depressed mood, anxiety, panic attacks, guilt, poor concentration, memory difficulty, sleep difficulty, fatigue, and loss of interest in activities. Diagnosis: Mood disorder NOS,  rule out bipolar disorder, GAD, rule out PTSD  Diagnosis:  Axis I:  1. Mood disorder   2. GAD (generalized anxiety disorder)        Generalized Anxiety Disorder        Axis II: Deferred

## 2011-05-13 NOTE — Patient Instructions (Signed)
Discussed orally 

## 2011-05-13 NOTE — Telephone Encounter (Signed)
Pls notify pt that since we are using this medication "off label", insurance will not cover it or anything like it. I could rx something similar that is generic and she would have to take a couple of doses a day instead of once daily, so ask her if she's interested in this--she would have to pay cash and it would likely be around 15 bucks a month---just a guess.  Let me know.  --thx

## 2011-05-13 NOTE — Telephone Encounter (Signed)
Prior auth denied.  Please advise alternative.

## 2011-05-14 NOTE — Telephone Encounter (Signed)
Pt is agreeable to alternate med.  If not controlled, please send RX to [facility].

## 2011-05-15 MED ORDER — AMPHETAMINE-DEXTROAMPHETAMINE 10 MG PO TABS: ORAL_TABLET | ORAL | Status: DC

## 2011-05-15 NOTE — Telephone Encounter (Signed)
Pt notified.  She will come pick up RX tomorrow.

## 2011-05-15 NOTE — Telephone Encounter (Signed)
Is is controlled, so she'll have to pick it up in person. Will print rx for generic adderall 86m, 1 tab po bid, #60, no RF.--PM

## 2011-05-31 ENCOUNTER — Telehealth: Payer: Self-pay | Admitting: *Deleted

## 2011-05-31 NOTE — Telephone Encounter (Signed)
Pt left voicemail stating she has UTI.  RC to pt.  Advised we have not checked her urine in over one year and therefore neither physician would call in antibiotics over the phone.  Offered pt appt with Dr. Charlett Blake in Dr. Idelle Leech absence, pt declined.  Advised pt if symptoms should worsen she will need to go to [facility] over the [facility] or next week if she does not want to see Dr.Blyth.  Pt agreeable and states policy is stupid that she has to come in even though she knows what is wrong with her.

## 2011-06-01 ENCOUNTER — Other Ambulatory Visit: Payer: Self-pay | Admitting: Family Medicine

## 2011-06-03 NOTE — Telephone Encounter (Signed)
eScribe request for refill on Ultram Last filled on 04/28/11. Last seen on 05/03/11 Follow up was for this week, but had to be rescheduled due to physician out of office. Please advise refills.

## 2011-06-03 NOTE — Telephone Encounter (Signed)
OK to refill for one month with same sig and same number (180)

## 2011-06-04 ENCOUNTER — Ambulatory Visit (INDEPENDENT_AMBULATORY_CARE_PROVIDER_SITE_OTHER): Payer: Medicare PPO | Admitting: Psychiatry

## 2011-06-04 ENCOUNTER — Ambulatory Visit: Payer: Self-pay | Admitting: Family Medicine

## 2011-06-04 DIAGNOSIS — F39 Unspecified mood [affective] disorder: Secondary | ICD-10-CM

## 2011-06-04 DIAGNOSIS — F411 Generalized anxiety disorder: Secondary | ICD-10-CM

## 2011-06-05 NOTE — Progress Notes (Signed)
Patient:  Diana [name]   DOB: October 24, 1955  MR Number: [id number]  Location: [place]:  [address] [place],  [place], [address]  Start: Tuesday 06/04/2011 4:05 PM  End: Tuesday 06/04/2011 4:55 PM  Provider/Observer:     Maurice Small, MSW, LCSW   Chief Complaint:      Chief Complaint  Patient presents with  . Depression  . Anxiety    Reason For Service:     The patient was referred for services by primary care physician Dr. Julien Nordmann due to patient experiencing grief, depression, and panic attacks. The patient's 56 year old stepson hanged himself in her and her fianc's front yard on 9//2012. This is patient's fianc's son. She reports she had been involved in her stepson's life since he was 46 years old and had to be the authoritarian. She is experiencing guilt as she felt like she had to be the bad guy. The patient reports additional stress regarding her fianc's 52 year old son who is experiencing issues related to his brother suicide as well as issues with his biological mother who uses crack and has told him she may not be around much longer per patient's report. He currently is residing with patient and her fianc. The patient has a long-standing history of depression and anxiety and reports being diagnosed with bipolar disorder several years ago and has attended treatment in [place] as well as in [place] at the [facility]. Patient is seen for a follow up appointment.  Interventions Strategy:  Supportive therapy, grief therapy, cognitive behavioral therapy  Participation Level:   Active  Participation Quality:  Monopolizing      Behavioral Observation:  Casual, Alert, and Tearful.   Current Psychosocial Factors: The patient's 35- year-old stepson completed suicide in 10/2010.  The patient reports recent conflict with her fiancee.  Content of Session:   Reviewing symptoms, identifying triggers of anxiety, discussing referral  to psychiatrist  Current Status:   The patient continues to experience depressed mood, anxiety, panic attacks, mood swings, sleep difficulty, excessive worry, memory difficulty, loss of interest in activities, low-energy, poor concentration, and guilt. She also reports continued visual images of his suicide.  Patient Progress:   Fair. The patient reports continued significant anxiety and panic attacks. She also reports increased neck pain resulted in decreased involvement in physical activity. She expresses frustration that she is unable to perform various tasks. She reports increased stress regarding her relationship with her fianc as he did not advocate for her when she had a disagreement with another customer at a store this weekend. Patient reports feeling abandoned. She also shares that fianc tends to become upset about one time per week. Patient reports automatically blaming self. Patient also continues to have unresolved grief and loss issues regarding her deceased stepson and continues to see visual images of deceased stepson. Patient also reports increased difficulty in the past week or so regarding leaving her home. Therapist and patient discussed possible referral to a psychiatrist. Patient reports difficulty with change including seeing a new provider but agrees that she needs a medication evaluation. She agrees to schedule an appointment to see Dr. Adele Schilder.  Target Goals:   1.Decreased intensity and frequency of panic attacks. 2. Decrease anxiety, excessive worry, and flashbacks 3. Improve mood and reduce crying spells. 4.Increase social involvement and social interaction.  Last Reviewed:   03/20/2011  Goals Addressed Today:    Decrease anxiety and frequency of panic attacks  Impression/Diagnosis:   The patient  presents with a 10+ year history of mood disturbances along with anxiety. Her symptoms have worsened since her stepson completed suicide in September 2012. The patient is experiencing  depressed mood, anxiety, panic attacks, guilt, poor concentration, memory difficulty, sleep difficulty, fatigue, and loss of interest in activities. Diagnosis: Mood disorder NOS,  rule out bipolar disorder, GAD, rule out PTSD  Diagnosis:  Axis I:  1. Mood disorder   2. GAD (generalized anxiety disorder)        Generalized Anxiety Disorder        Axis II: Deferred

## 2011-06-12 ENCOUNTER — Ambulatory Visit: Payer: Self-pay | Admitting: Family Medicine

## 2011-06-14 ENCOUNTER — Ambulatory Visit: Payer: Self-pay | Admitting: Family Medicine

## 2011-06-19 ENCOUNTER — Other Ambulatory Visit: Payer: Self-pay | Admitting: Family Medicine

## 2011-06-25 ENCOUNTER — Ambulatory Visit (INDEPENDENT_AMBULATORY_CARE_PROVIDER_SITE_OTHER): Payer: Medicare PPO | Admitting: Psychiatry

## 2011-06-25 DIAGNOSIS — F411 Generalized anxiety disorder: Secondary | ICD-10-CM

## 2011-06-25 DIAGNOSIS — F39 Unspecified mood [affective] disorder: Secondary | ICD-10-CM

## 2011-06-25 NOTE — Progress Notes (Signed)
Patient:  Diana [name]   DOB: Dec 27, 1955  MR Number: [id number]  Location: [place]:  [address] [place],  [place], [address]  Start: Tuesday 06/25/2011 4:10 PM  End: Tuesday 06/25/2011 4:55 PM  Provider/Observer:     Maurice Small, MSW, LCSW   Chief Complaint:      Chief Complaint  Patient presents with  . Depression  . Anxiety    Reason For Service:     The patient was referred for services by primary care physician Dr. Julien Nordmann due to patient experiencing grief, depression, and panic attacks. The patient's 55 year old stepson hanged himself in her and her fianc's front yard on 9//2012. This is patient's fianc's son. She reports she had been involved in her stepson's life since he was 5 years old and had to be the authoritarian. She is experiencing guilt as she felt like she had to be the bad guy. The patient reports additional stress regarding her fianc's 32 year old son who is experiencing issues related to his brother suicide as well as issues with his biological mother who uses crack and has told him she may not be around much longer per patient's report. He currently is residing with patient and her fianc. The patient has a long-standing history of depression and anxiety and reports being diagnosed with bipolar disorder several years ago and has attended treatment in [place] as well as in [place] at the [facility]. Patient is seen for a follow up appointment.  Interventions Strategy:  Supportive therapy, grief therapy, cognitive behavioral therapy  Participation Level:   Active  Participation Quality:  Monopolizing      Behavioral Observation:  Casual, Alert, and Tearful.   Current Psychosocial Factors: The patient's 55- year-old stepson completed suicide in 10/2010.    Content of Session:   Reviewing symptoms, identifying thought patterns and effects on mood and behavior, exploring coping and relaxation  techniques  Current Status:   The patient continues to experience depressed mood, anxiety, panic attacks, mood swings, sleep difficulty, excessive worry, memory difficulty, loss of interest in activities, low-energy, poor concentration, and guilt. She also reports continued visual images of his suicide.  Patient Progress:   Fair. The patient reports continued significant anxiety and panic attacks. She reports continued difficulty regarding leaving home and being in public. She states remaining at her home since last session until she went out to dinner for Mother's Day this past Sunday. She reports becoming distraught Sunday as her stepson's mother did not have contact with him on Mother's Day. Patient also continues to experience depressed mood and sadness regarding her reduced physical functioning.    Target Goals:   1.Decreased intensity and frequency of panic attacks. 2. Decrease anxiety, excessive worry, and flashbacks 3. Improve mood and reduce crying spells. 4.Increase social involvement and social interaction.  Last Reviewed:   03/20/2011  Goals Addressed Today:    Decrease anxiety and frequency of panic attacks, decrease worry  Impression/Diagnosis:   The patient presents with a 10+ year history of mood disturbances along with anxiety. Her symptoms have worsened since her stepson completed suicide in September 2012. The patient is experiencing depressed mood, anxiety, panic attacks, guilt, poor concentration, memory difficulty, sleep difficulty, fatigue, and loss of interest in activities. Diagnosis: Mood disorder NOS,  rule out bipolar disorder, GAD, rule out PTSD  Diagnosis:  Axis I:  1. Mood disorder   2. Generalized anxiety disorder        Generalized Anxiety Disorder  Axis II: Deferred

## 2011-06-25 NOTE — Patient Instructions (Signed)
Discussed orally 

## 2011-06-27 ENCOUNTER — Ambulatory Visit (HOSPITAL_COMMUNITY): Payer: Self-pay | Admitting: Psychiatry

## 2011-07-01 ENCOUNTER — Other Ambulatory Visit: Payer: Self-pay | Admitting: Family Medicine

## 2011-07-02 NOTE — Telephone Encounter (Signed)
eScribe request for refill on Remeron Last seen on 05/03/11 Follow up 4/20 rescheduled due to MD out of office RX sent for 30 days. PC to pt to remind to schedule an appoitntment this month.  She is agreeable.  She states she is having a bad time with her allergies and needs to make a pain management appt first.

## 2011-07-04 ENCOUNTER — Encounter: Payer: Self-pay | Admitting: Family Medicine

## 2011-07-04 ENCOUNTER — Ambulatory Visit (INDEPENDENT_AMBULATORY_CARE_PROVIDER_SITE_OTHER): Payer: Medicare PPO | Admitting: Family Medicine

## 2011-07-04 VITALS — BP 104/69 | HR 74 | Temp 97.5°F | Ht 65.0 in | Wt 158.0 lb

## 2011-07-04 DIAGNOSIS — E559 Vitamin D deficiency, unspecified: Secondary | ICD-10-CM

## 2011-07-04 DIAGNOSIS — R5382 Chronic fatigue, unspecified: Secondary | ICD-10-CM

## 2011-07-04 DIAGNOSIS — D509 Iron deficiency anemia, unspecified: Secondary | ICD-10-CM

## 2011-07-04 DIAGNOSIS — E538 Deficiency of other specified B group vitamins: Secondary | ICD-10-CM

## 2011-07-04 DIAGNOSIS — IMO0001 Reserved for inherently not codable concepts without codable children: Secondary | ICD-10-CM

## 2011-07-04 DIAGNOSIS — R5381 Other malaise: Secondary | ICD-10-CM

## 2011-07-04 DIAGNOSIS — J209 Acute bronchitis, unspecified: Secondary | ICD-10-CM

## 2011-07-04 LAB — COMPREHENSIVE METABOLIC PANEL
ALT: 15 U/L (ref 0–35)
AST: 17 U/L (ref 0–37)
Alkaline Phosphatase: 73 U/L (ref 39–117)
Calcium: 9.7 mg/dL (ref 8.4–10.5)
Chloride: 106 mEq/L (ref 96–112)
Creat: 0.95 mg/dL (ref 0.50–1.10)
Total Bilirubin: 0.4 mg/dL (ref 0.3–1.2)

## 2011-07-04 LAB — LIPID PANEL
LDL Cholesterol: 99 mg/dL (ref 0–99)
Total CHOL/HDL Ratio: 4.2 Ratio
Triglycerides: 111 mg/dL (ref <150)
VLDL: 22 mg/dL (ref 0–40)

## 2011-07-04 LAB — CBC WITH DIFFERENTIAL/PLATELET
Basophils Relative: 0 % (ref 0–1)
Eosinophils Absolute: 0.1 10*3/uL (ref 0.0–0.7)
MCH: 30.9 pg (ref 26.0–34.0)
MCHC: 33.7 g/dL (ref 30.0–36.0)
Monocytes Relative: 3 % (ref 3–12)
Neutrophils Relative %: 53 % (ref 43–77)
Platelets: 312 10*3/uL (ref 150–400)

## 2011-07-04 LAB — HEMOGLOBIN A1C: Hgb A1c MFr Bld: 6.1 % — ABNORMAL HIGH (ref <5.7)

## 2011-07-04 MED ORDER — BENZONATATE 200 MG PO CAPS: 200.0000 mg | ORAL_CAPSULE | Freq: Three times a day (TID) | ORAL | Status: AC | PRN

## 2011-07-04 MED ORDER — PREDNISONE 20 MG PO TABS: ORAL_TABLET | ORAL | Status: DC

## 2011-07-04 MED ORDER — AZITHROMYCIN 250 MG PO TABS: ORAL_TABLET | ORAL | Status: DC

## 2011-07-04 MED ORDER — BENZONATATE 200 MG PO CAPS: 200.0000 mg | ORAL_CAPSULE | Freq: Three times a day (TID) | ORAL | Status: DC | PRN

## 2011-07-04 NOTE — Assessment & Plan Note (Signed)
She felt significant subjective improvement in breathing/aeration after albut2.5/atrovent 0.5 neb in office today.  PLAN:  Prednisone 69m qd x 5d, then 2101mqd x 5d. Z-pack. Tessalon perles 20049mid prn cough. Encouraged smoking cessation.

## 2011-07-04 NOTE — Assessment & Plan Note (Signed)
Report of home monitoring is encouraging, but will get HbA1c today. All monitoring UTD except she needs to call Dr. Kellie Moor office for eye exam. Pneumovax given today, diabetic foot exam normal today.

## 2011-07-04 NOTE — Progress Notes (Signed)
OFFICE VISIT  07/04/2011   CC:  Chief Complaint  Patient presents with  . Cough     HPI:    Patient is a 56 y.o. Caucasian female who presents for 2 month f/u DM 2 and mood disorder, but actually says she has a respiratory illness lately that she really wants to discuss. Pt presents complaining of respiratory symptoms for 3+ weeks.  Primary symptoms are: started out like URI but has gradually progressed to significant cough/wheeze.  Worst symptoms seems to be the cough, wheeze, nasal congestion/mucous.  Lately the symptoms seem to be worsening.  No fever.  Mucous is yellow/green.  Symptoms made worse by activity, talking, eating.  Symptoms improved by nothing. Smoker? yes Recent sick contact? Yes (son with resp illness prior to her). Muscle or joint aches? Neck--chronic, worse with coughing.  Additional ROS: some vomiting s/p coughing spell, some abd pain with coughing spells as well. No rash.    Worsened chronic fatigue, poor appetite.  Glucoses: didn't bring log book but says highest glucose she can recall since last visit was 160.  ? Avg 130s PP, fastings around 100. Lately with decreased PO intake she has decreased her lantus from 15 down to 8 U.   Of note, she did not get the labs done that I ordered for [facility] in [place] after her last visit here 80moago.  Adderall 146mbid started after last visit was helping her energy level somewhat but she's been feeling so ill lately that she hasn't been taking it--has many left in her bottle.  Neck pain still problematic.  She is s/p 3 neck injections and doesn't know if she'll be given another one. Currently taking oxycontin bid + ultram prn breakthrough pain.  Past Medical History  Diagnosis Date  . Diabetes mellitus     urine protein-Cr ratio nl 09/2010, D.R. Screen neg 10/2009  . Obesity     s/p bariatric surgery  . Hepatic steatosis 2005    ultrasound  . Bipolar disorder     questionable  . Restless legs syndrome   .  Insomnia   . Neurosis, anxiety, panic type   . Chronic fatigue   . Iron deficiency anemia 09/2009    malabsorbtion s/p bariatric surgery  . Vitamin d deficiency 09/2009  . Other B-complex deficiencies   . Nephrolithiasis   . Osteopenia 12/2010    Hip T score -2.0.  Spine T score -0.5 (plan to repeat in 2 yrs)  . Colon cancer screening 03/06/2011    iFob 11/2010 through her insurer was negative.   . Tobacco dependence     Past Surgical History  Procedure Date  . Roux-en-y gastric bypass   . Tonsillectomy   . Abdominal hysterectomy     fibroids, ovaries remain  . Bladder surgery     bladder tack  . Appendectomy   . Hernia repair 2005    mesenteric hernia repair, with lysis of adhesions (presented with SBO)  . Cholecystectomy 04/2010    w/lysis of adhesions (open procedure)--path showed chronic cholecystitis and cholesterol polyp.  . Marland Kitchenpidural block injection 03/2011    Cervical    Outpatient Prescriptions Prior to Visit  Medication Sig Dispense Refill  . amphetamine-dextroamphetamine (ADDERALL) 10 MG tablet 1 tab po bid  60 tablet  0  . aspirin 325 MG EC tablet Take 325 mg by mouth daily.      . B-D 3CC LUER-LOK SYR 23GX1" 23G X 1" 3 ML MISC USE AS DIRECTED.  10 each  1  . calcium elemental as carbonate (TUMS ULTRA 1000) 400 MG tablet Chew 1,000 mg by mouth daily.        . clonazePAM (KLONOPIN) 2 MG tablet Take 1 tablet (2 mg total) by mouth 3 (three) times daily.  90 tablet  2  . cyanocobalamin (,VITAMIN B-12,) 1000 MCG/ML injection INJECT 1 ML ONCE MONTHLY.  10 mL  1  . GLUCOPHAGE 1000 MG tablet TAKE (1) TABLET BY MOUTH TWICE DAILY.  60 each  2  . Insulin Aspart (NOVOLOG FLEXPEN [place]) Inject 5 Units into the skin 3 (three) times daily. With each meal      . insulin detemir (LEVEMIR FLEXPEN) 100 UNIT/ML injection Inject 8 Units into the skin at bedtime.       . Linaclotide (LINZESS) 145 MCG CAPS Take 1 capsule by mouth daily.      Marland Kitchen LIPITOR 20 MG tablet TAKE ONE TABLET DAILY.  30  each  11  . Multiple Vitamin (MULTIVITAMIN) tablet Take 1 tablet by mouth daily.        Marland Kitchen NOVOFINE 32G X 6 MM MISC USE AS DIRECTED.  100 each  2  . ONE TOUCH ULTRA TEST test strip CHECK BLOOD SUGAR TWO TIMES DAILY AS DIRECTED.  100 each  5  . ONETOUCH DELICA LANCETS MISC Check glucose bid  60 each  6  . oxycodone (OXYCONTIN) 30 MG TB12 Take 30 mg by mouth 3 (three) times daily.      . pramipexole (MIRAPEX) 0.125 MG tablet Take 3-4 tablets by mouth at bedtime.  May take up to 5 tablets.  120 tablet  6  . promethazine (PHENERGAN) 12.5 MG tablet Take 1-2 tablet every 6 hours as needed for nausea       . REMERON 45 MG tablet TAKE (1) TABLET BY MOUTH AT BEDTIME.  30 each  0  . tiZANidine (ZANAFLEX) 4 MG tablet Take 1 tablet by mouth Three times a day.      Marland Kitchen ULTRAM 50 MG tablet TAKE 2 TABLETS TWICE A DAY AND AT BEDTIME AS DIRECTED.  180 each  0  . Vitamin D, Ergocalciferol, (DRISDOL) 50000 UNITS CAPS 1 cap po q week  4 capsule  6  . WELLBUTRIN XL 150 MG 24 hr tablet TAKE ONE TABLET DAILY.  30 each  4  . ferrous sulfate 325 (65 FE) MG tablet Take 325 mg by mouth daily.        Marland Kitchen oxyCODONE (ROXICODONE) 15 MG immediate release tablet Take 1 tablet (15 mg total) by mouth every 6 (six) hours as needed for pain.  120 tablet  0    Allergies  Allergen Reactions  . Codeine     REACTION: itching  . Latex   . Penicillins     REACTION: took large quantities as a child and was told to never take again    ROS As per HPI  PE: Blood pressure 104/69, pulse 74, temperature 97.5 F (36.4 C), temperature source Temporal, height 5' 5"  (1.651 m), weight 158 lb (71.668 kg), SpO2 96.00%. Gen: alert, tired appearing but NAD.  Pale. Affect: pleasant.  Lucid thought and speech. ENT: Ears: EACs clear, normal epithelium.  TMs with good light reflex and landmarks bilaterally.  Eyes: no injection, icteris, swelling, or exudate.  EOMI, PERRLA. Nose: no drainage or turbinate edema/swelling.  No injection or focal  lesion.  Mouth: lips without lesion/swelling.  Oral mucosa pink and moist.  Dentition intact and without obvious caries or gingival swelling.  Oropharynx without erythema, exudate, or swelling.  Neck - No masses or thyromegaly or limitation in range of motion CV: RRR, slightly distant S1 and S2, without murmur/rub/gallop LUNGS: CTA bilat, but her effort is poor and she has excessive post-exhalation coughing.  No tachypnea. EXT: no clubbing, cyanosis, or edema.  FEET: DP and PT pulses 1+ bilat, feet warm and without edema.  No skin breakdown, erythema, or calluses. Nails normal.  Monofilament testing reveals no loss of sensation in either foot.  LABS:  None today  IMPRESSION AND PLAN:  Acute bronchitis She felt significant subjective improvement in breathing/aeration after albut2.5/atrovent 0.5 neb in office today.  PLAN:  Prednisone 83m qd x 5d, then 250mqd x 5d. Z-pack. Tessalon perles 20092mid prn cough. Encouraged smoking cessation.  DIAB W/O MENTION COMP TYPE II/UNS TYPE UNCNTRL Report of home monitoring is encouraging, but will get HbA1c today. All monitoring UTD except she needs to call Dr. ShaKellie Moorfice for eye exam. Pneumovax given today, diabetic foot exam normal today.  Chronic fatigue Multifactorial (mood disorder/anxiety, chronic illness, chronic pain, polypharmacy). She reports slight improved energy on adderall 47m5md but hasn't had a very long trial of it yet. No new rx given today. Continue all current psychotropic meds at current doses. She'll continue counseling as well.     FOLLOW UP: Return in about 1 week (around 07/11/2011) for f/u bronchitis.

## 2011-07-04 NOTE — Assessment & Plan Note (Signed)
Multifactorial (mood disorder/anxiety, chronic illness, chronic pain, polypharmacy). She reports slight improved energy on adderall 65m bid but hasn't had a very long trial of it yet. No new rx given today. Continue all current psychotropic meds at current doses. She'll continue counseling as well.

## 2011-07-11 ENCOUNTER — Ambulatory Visit: Payer: Self-pay | Admitting: Family Medicine

## 2011-07-16 ENCOUNTER — Ambulatory Visit: Payer: Self-pay | Admitting: Family Medicine

## 2011-07-17 ENCOUNTER — Ambulatory Visit: Payer: Self-pay | Admitting: Family Medicine

## 2011-08-05 ENCOUNTER — Other Ambulatory Visit: Payer: Self-pay | Admitting: Family Medicine

## 2011-08-05 NOTE — Telephone Encounter (Signed)
eScribe request for refill on KLONOPIN Last filled-04/29/11, #90 x 2  eScribe request for refill on VITAMIN D Last filled-11/02/10, #4 x 6  Last seen on 07/04/11 for bronchitis, but other issues addressed Follow up in one week, pt cancelled twice and no showed once.  No appts in system. Please advise RX.

## 2011-09-04 ENCOUNTER — Other Ambulatory Visit: Payer: Self-pay | Admitting: Family Medicine

## 2011-09-04 NOTE — Telephone Encounter (Signed)
eScribe request for refill on REMERON Last filled - 07/02/11, #30 x 0 RX sent  eScribe request for refill on MIRAPEX Last filled - 11/02/10, #120 x 6 RX sent  eScribe request for refill on METFORMIN Last filled - 03/15/11, #60 x 2 RX sent  Last seen - 3/22 Follow up - not clearly indicated  RX's have been sent for 1 month supply.  Pt was seen in May for acute visit and a couple of chronic problems were addressed.  Dr. Anitra Lauth please advise when patient needs to follow up.

## 2011-09-09 NOTE — Telephone Encounter (Signed)
Noted.  F/u needs to be within the next 1 month (30 min appt)--thx

## 2011-09-19 NOTE — Telephone Encounter (Signed)
Pt notified and she will make appt.  She is having problems with her bills and affording copays.  She has also fallen recently and unable to move very well.  She states she will call for appt at a later date.

## 2011-10-08 ENCOUNTER — Other Ambulatory Visit: Payer: Self-pay | Admitting: Family Medicine

## 2011-10-09 NOTE — Telephone Encounter (Signed)
eScribe request for refill on MIRAPEX Last filled - 09/04/11 Last seen on - 07/04/11 Pt was advised in phone conversation on 09/19/11 she will need OV prior to any additional refills.  No appt in system.  RX denied.

## 2011-10-10 ENCOUNTER — Other Ambulatory Visit: Payer: Self-pay | Admitting: Family Medicine

## 2011-10-16 ENCOUNTER — Other Ambulatory Visit: Payer: Self-pay | Admitting: Family Medicine

## 2011-10-17 NOTE — Telephone Encounter (Signed)
Notified pt.  appt scheduled for 10/24/11.

## 2011-10-17 NOTE — Telephone Encounter (Signed)
Pt due for follow up.  Given 30 day supply on 7/24 and told via phone to make appt within 30 days.  Pt has not made appt.  Per Dr. Anitra Lauth pt can have 30 day supply, but will need appt for more refills.  30 day supply sent.

## 2011-10-24 ENCOUNTER — Ambulatory Visit: Payer: Self-pay | Admitting: Family Medicine

## 2011-10-30 ENCOUNTER — Ambulatory Visit: Payer: Self-pay | Admitting: Family Medicine

## 2011-11-04 ENCOUNTER — Ambulatory Visit: Payer: Self-pay | Admitting: Family Medicine

## 2011-11-13 ENCOUNTER — Ambulatory Visit (INDEPENDENT_AMBULATORY_CARE_PROVIDER_SITE_OTHER): Payer: Medicare PPO | Admitting: Family Medicine

## 2011-11-13 ENCOUNTER — Encounter: Payer: Self-pay | Admitting: Family Medicine

## 2011-11-13 VITALS — BP 94/67 | HR 84 | Ht 65.0 in | Wt 146.0 lb

## 2011-11-13 DIAGNOSIS — F329 Major depressive disorder, single episode, unspecified: Secondary | ICD-10-CM

## 2011-11-13 DIAGNOSIS — L089 Local infection of the skin and subcutaneous tissue, unspecified: Secondary | ICD-10-CM

## 2011-11-13 DIAGNOSIS — K909 Intestinal malabsorption, unspecified: Secondary | ICD-10-CM

## 2011-11-13 DIAGNOSIS — M542 Cervicalgia: Secondary | ICD-10-CM

## 2011-11-13 DIAGNOSIS — E119 Type 2 diabetes mellitus without complications: Secondary | ICD-10-CM

## 2011-11-13 DIAGNOSIS — F3289 Other specified depressive episodes: Secondary | ICD-10-CM

## 2011-11-13 DIAGNOSIS — F411 Generalized anxiety disorder: Secondary | ICD-10-CM

## 2011-11-13 DIAGNOSIS — G2581 Restless legs syndrome: Secondary | ICD-10-CM

## 2011-11-13 LAB — COMPREHENSIVE METABOLIC PANEL
ALT: 17 U/L (ref 0–35)
AST: 24 U/L (ref 0–37)
Alkaline Phosphatase: 103 U/L (ref 39–117)
CO2: 29 mEq/L (ref 19–32)
Creat: 1.08 mg/dL (ref 0.50–1.10)
Sodium: 139 mEq/L (ref 135–145)
Total Bilirubin: 0.4 mg/dL (ref 0.3–1.2)
Total Protein: 6.5 g/dL (ref 6.0–8.3)

## 2011-11-13 LAB — LIPID PANEL
HDL: 35 mg/dL — ABNORMAL LOW (ref >39)
LDL Cholesterol: 27 mg/dL (ref 0–99)
Total CHOL/HDL Ratio: 2.3 Ratio
Triglycerides: 88 mg/dL (ref <150)
VLDL: 18 mg/dL (ref 0–40)

## 2011-11-13 LAB — MICROALBUMIN / CREATININE URINE RATIO
Creatinine,U: 181.9 mg/dL
Microalb Creat Ratio: 0.5 mg/g (ref 0.0–30.0)

## 2011-11-13 LAB — HEMOGLOBIN A1C: Hgb A1c MFr Bld: 5.9 % — ABNORMAL HIGH (ref <5.7)

## 2011-11-13 LAB — VITAMIN B12: Vitamin B-12: 638 pg/mL (ref 211–911)

## 2011-11-13 MED ORDER — CLONAZEPAM 2 MG PO TABS: 2.0000 mg | ORAL_TABLET | Freq: Three times a day (TID) | ORAL | Status: DC | PRN

## 2011-11-13 MED ORDER — METFORMIN HCL 1000 MG PO TABS: 1000.0000 mg | ORAL_TABLET | Freq: Two times a day (BID) | ORAL | Status: DC

## 2011-11-13 MED ORDER — PRAMIPEXOLE DIHYDROCHLORIDE 0.125 MG PO TABS: ORAL_TABLET | ORAL | Status: DC

## 2011-11-13 MED ORDER — CYANOCOBALAMIN 1000 MCG/ML IJ SOLN: 1000.0000 ug | INTRAMUSCULAR | Status: DC

## 2011-11-13 MED ORDER — ARIPIPRAZOLE 5 MG PO TABS: 5.0000 mg | ORAL_TABLET | Freq: Every day | ORAL | Status: DC

## 2011-11-13 MED ORDER — ATORVASTATIN CALCIUM 20 MG PO TABS: 20.0000 mg | ORAL_TABLET | Freq: Every day | ORAL | Status: DC

## 2011-11-13 MED ORDER — MUPIROCIN 2 % EX OINT: TOPICAL_OINTMENT | Freq: Three times a day (TID) | CUTANEOUS | Status: DC

## 2011-11-13 MED ORDER — VITAMIN D (ERGOCALCIFEROL) 1.25 MG (50000 UNIT) PO CAPS: 50000.0000 [IU] | ORAL_CAPSULE | ORAL | Status: DC

## 2011-11-13 MED ORDER — PROMETHAZINE HCL 12.5 MG PO TABS: ORAL_TABLET | ORAL | Status: DC

## 2011-11-13 NOTE — Assessment & Plan Note (Signed)
She has ongoing relationship with pain mgmt MD, also has a f/u appt with neurosurgeon but says "if he doesn't have anything to offer to help fix me this time then I'm not going back".

## 2011-11-13 NOTE — Assessment & Plan Note (Signed)
Slow-to-heal area behind left ear. Bactroban ointment rx'd, pt encouraged to stop picking/scratching at the lesion.

## 2011-11-13 NOTE — Assessment & Plan Note (Signed)
Severe, but not suicidal. Will add abilify 9m qd to her current regimen of wellbutrin and remeron. I mentioned the possibility of ECT therapy in the future if meds continue to fail and she was hesitant at the mention of it. I told her to just be thinking about it and we'll talk about it more in the future if needed. She declined further counseling referral at this time, [facility]

## 2011-11-13 NOTE — Assessment & Plan Note (Signed)
Stable. Continue with 4-5 pramipexole qhs, new rx given today with RF's.
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
## 2011-11-13 NOTE — Assessment & Plan Note (Signed)
Good control per pt report of glucoses. She is off of insulin now. Check HbA1c today as well as urine microalb/cr. She'll continue metformin 1054m bid for now.

## 2011-11-13 NOTE — Assessment & Plan Note (Signed)
Hx of Roux-en-Y bypass surgery.  Will check IBC panel and vit B12 level today.

## 2011-11-13 NOTE — Assessment & Plan Note (Addendum)
GAD with agoraphobia. She is coping but just barely.  At times it gets so bad that she vomits, so she needs her phenergan--I RF'd this today. Continue current psych meds. RFs of clonazepam given today.

## 2011-11-14 LAB — IBC PANEL
%SAT: 40 % (ref 20–55)
TIBC: 199 ug/dL — ABNORMAL LOW (ref 250–470)

## 2011-11-18 ENCOUNTER — Telehealth: Payer: Self-pay | Admitting: *Deleted

## 2011-11-18 MED ORDER — CYANOCOBALAMIN 1000 MCG/ML IJ SOLN: 1000.0000 ug | INTRAMUSCULAR | Status: DC

## 2011-11-18 MED ORDER — BUPROPION HCL ER (XL) 150 MG PO TB24: 150.0000 mg | ORAL_TABLET | Freq: Every day | ORAL | Status: DC

## 2011-11-18 MED ORDER — "SYRINGE/NEEDLE (DISP) 23G X 1"" 3 ML MISC": Status: DC

## 2011-11-18 NOTE — Telephone Encounter (Signed)
Called pt with lab results.  These are discussed with patient.   Pt has several qeustions regarding meds: *Verified with Rhona Leavens that RX for Mirapex is at pharmacy. *Advised in last OV note that she should continue generic Wellbutrin with abilify.  She is agreeable. *RX for cyanocobalamin is correct to every 2 weeks instead of monthly and this is sent to pharmacy. *RX for syringes/needles for B12 injections sent to pharmacy.

## 2011-11-20 ENCOUNTER — Other Ambulatory Visit: Payer: Self-pay | Admitting: *Deleted

## 2011-11-20 MED ORDER — MIRTAZAPINE 45 MG PO TABS: 45.0000 mg | ORAL_TABLET | Freq: Every day | ORAL | Status: DC

## 2011-11-20 NOTE — Telephone Encounter (Signed)
MIRTAZAPINE was not at pharmacy after visit last week.  RX sent.

## 2011-11-28 ENCOUNTER — Ambulatory Visit (INDEPENDENT_AMBULATORY_CARE_PROVIDER_SITE_OTHER): Payer: Medicare PPO | Admitting: Family Medicine

## 2011-11-28 ENCOUNTER — Encounter: Payer: Self-pay | Admitting: Family Medicine

## 2011-11-28 VITALS — BP 100/70 | HR 91 | Ht 65.0 in | Wt 142.0 lb

## 2011-11-28 DIAGNOSIS — F17200 Nicotine dependence, unspecified, uncomplicated: Secondary | ICD-10-CM

## 2011-11-28 DIAGNOSIS — M47812 Spondylosis without myelopathy or radiculopathy, cervical region: Secondary | ICD-10-CM

## 2011-11-28 DIAGNOSIS — G8929 Other chronic pain: Secondary | ICD-10-CM

## 2011-11-28 DIAGNOSIS — F329 Major depressive disorder, single episode, unspecified: Secondary | ICD-10-CM

## 2011-11-28 DIAGNOSIS — F411 Generalized anxiety disorder: Secondary | ICD-10-CM

## 2011-11-28 DIAGNOSIS — F3289 Other specified depressive episodes: Secondary | ICD-10-CM

## 2011-11-28 DIAGNOSIS — E119 Type 2 diabetes mellitus without complications: Secondary | ICD-10-CM

## 2011-11-28 DIAGNOSIS — M542 Cervicalgia: Secondary | ICD-10-CM

## 2011-11-28 MED ORDER — ARIPIPRAZOLE 10 MG PO TABS: 10.0000 mg | ORAL_TABLET | Freq: Every day | ORAL | Status: DC

## 2011-11-28 MED ORDER — NICOTINE 10 MG IN INHA: RESPIRATORY_TRACT | Status: DC

## 2011-11-28 MED ORDER — ONDANSETRON 8 MG PO TBDP: 8.0000 mg | ORAL_TABLET | Freq: Three times a day (TID) | ORAL | Status: DC | PRN

## 2011-11-28 NOTE — Progress Notes (Signed)
OFFICE NOTE  11/28/2011  CC:  Chief Complaint  Patient presents with  . Follow-up    depression, ? increase Abilify     HPI: Patient is a 56 y.o. Caucasian female who is here for f/u severe depression, started abilify 40m qd two weeks ago.  Takes it with her wellbutrin in the morning.  Tolerating this fine as far as sedation goes, no GI effects. She is "forcing herself" to eat but still is throwing up.  She's trying to eat 2 meals a day, has some dysphagia and also gets frequent nausea, typically 1/2 way through a meal, often vomits.  Says she would prefer zofran ODT b/c this helped in the past---better than phenergan.    She is not satisfied with her progress with her current neurosurgeon (Dr. RCarloyn Manner.  She feels like he is not being aggressive enough with her neck pain.  She is currently taking chronic narcotic pain med regimen and getting steroid injections in her neck by Dr. RRex Krascolleague, Dr. OFrancesco Runner(she has had 3 injections and is about to get a 4th).  After injections she gets 50% relief for about 2 wks.    Regarding her agoraphobia/anxiety, she says she has left her home alone only once since last visit with me 2 wks ago.   She plans to move back to [place] b/c of the situation with her family lately.  She wants to try to quit smoking, is interested in nicotine inhaler.    Pertinent PMH:  Past Medical History  Diagnosis Date  . Diabetes mellitus     urine protein-Cr ratio nl 09/2010, D.R. Screen neg 10/2009  . Obesity     s/p bariatric surgery  . Hepatic steatosis 2005    ultrasound  . Bipolar disorder     questionable  . Restless legs syndrome   . Insomnia   . Neurosis, anxiety, panic type   . Chronic fatigue   . Iron deficiency anemia 09/2009    malabsorbtion s/p bariatric surgery  . Vitamin D deficiency 09/2009  . Other B-complex deficiencies   . Nephrolithiasis   . Osteopenia 12/2010    Hip T score -2.0.  Spine T score -0.5 (plan to repeat in 2 yrs)  .  Colon cancer screening 03/06/2011    iFob 11/2010 through her insurer was negative.   . Tobacco dependence     MEDS:  Outpatient Prescriptions Prior to Visit  Medication Sig Dispense Refill  . amphetamine-dextroamphetamine (ADDERALL) 10 MG tablet 1 tab po bid  60 tablet  0  . ARIPiprazole (ABILIFY) 5 MG tablet Take 1 tablet (5 mg total) by mouth daily.  30 tablet  1  . aspirin 325 MG EC tablet Take 325 mg by mouth daily.      .Marland Kitchenatorvastatin (LIPITOR) 20 MG tablet Take 1 tablet (20 mg total) by mouth daily.  30 tablet  6  . buPROPion (WELLBUTRIN XL) 150 MG 24 hr tablet Take 1 tablet (150 mg total) by mouth daily.  30 tablet  6  . calcium elemental as carbonate (TUMS ULTRA 1000) 400 MG tablet Chew 1,000 mg by mouth daily.        . clonazePAM (KLONOPIN) 2 MG tablet Take 1 tablet (2 mg total) by mouth 3 (three) times daily as needed for anxiety.  90 tablet  1  . cyanocobalamin (,VITAMIN B-12,) 1000 MCG/ML injection Inject 1 mL (1,000 mcg total) into the muscle every 14 (fourteen) days.  10 mL  1  .  ferrous sulfate 325 (65 FE) MG tablet Take 325 mg by mouth daily.        . Insulin Aspart (NOVOLOG FLEXPEN [place]) Inject 5 Units into the skin 3 (three) times daily. With each meal      . insulin detemir (LEVEMIR FLEXPEN) 100 UNIT/ML injection Inject 8 Units into the skin at bedtime.       . metFORMIN (GLUCOPHAGE) 1000 MG tablet Take 1 tablet (1,000 mg total) by mouth 2 (two) times daily with a meal.  60 tablet  6  . mirtazapine (REMERON) 45 MG tablet Take 1 tablet (45 mg total) by mouth at bedtime.  30 tablet  6  . Multiple Vitamin (MULTIVITAMIN) tablet Take 1 tablet by mouth daily.        . mupirocin ointment (BACTROBAN) 2 % Apply topically 3 (three) times daily.  15 g  1  . NOVOFINE 32G X 6 MM MISC USE AS DIRECTED.  100 each  2  . ONE TOUCH ULTRA TEST test strip CHECK BLOOD SUGAR TWO TIMES DAILY AS DIRECTED.  100 each  5  . ONETOUCH DELICA LANCETS MISC Check glucose bid  60 each  6  . oxycodone  (OXYCONTIN) 30 MG TB12 Take 30 mg by mouth 3 (three) times daily.      Marland Kitchen oxyCODONE (ROXICODONE) 15 MG immediate release tablet Take 1 tablet (15 mg total) by mouth every 6 (six) hours as needed for pain.  120 tablet  0  . pramipexole (MIRAPEX) 0.125 MG tablet 4-5 tabs po qhs  120 tablet  6  . promethazine (PHENERGAN) 12.5 MG tablet Take 1-2 tablet every 6 hours as needed for nausea  60 tablet  2  . SYRINGE-NEEDLE, DISP, 3 ML (B-D 3CC LUER-LOK SYR 23GX1") 23G X 1" 3 ML MISC Use as directed to inject B12  10 each  1  . tiZANidine (ZANAFLEX) 4 MG tablet Take 1 tablet by mouth Three times a day.      . Vitamin D, Ergocalciferol, (DRISDOL) 50000 UNITS CAPS Take 1 capsule (50,000 Units total) by mouth every 7 (seven) days.  4 capsule  3    PE: Blood pressure 100/70, pulse 91, height 5' 5"  (1.651 m), weight 142 lb (64.411 kg). Gen: Alert, well appearing.  Patient is oriented to person, place, time, and situation. Affect: pleasant, good eye contact.  Lucid thought and speech.  She giggled a bit easily compared to her usual, talked a bit more than her usual.  However, she only teared-up once during the interview, which is a big improvement. CV: RRR, no m/r/g.   LUNGS: CTA bilat, nonlabored resps, good aeration in all lung fields.   IMPRESSION AND PLAN:  Cervical pain She has documented cervical spondylosis at C5-6 and C6-7. Poor response to steroid injections at these sites. Patient not satisfied with Dr. Rex Kras lack of aggressiveness regarding further workup or treatment.  She requests referral to another neurosurgeon for a second opinion.  I ordered this referral today. She will continue her chronic pain mgmt (narcotics) with Dr. Francesco Runner who is a colleague of Dr. Carloyn Manner.  DEPRESSION, RECURRENT Improved somewhat on Abilify 63m qd.  This seems to have had an activating effect on her. I will go ahead and push the dose to 125mqd.  Continue wellbutrin and remeron.  ANXIETY STATE, UNSPECIFIED GAD with  agoraphobia. She is not doing so well with this right now. Lots of nausea related to this problem.  Gave zofran for daytime use, save phenergan for night  time use.  Tobacco dependence I did eRx for nicotrol inhalers and discussed appropriate use of these in order to help her completely quit smoking.  Type II or unspecified type diabetes mellitus without mention of complication, not stated as uncontrolled Good control per home glucose monitoring. Lab Results  Component Value Date   HGBA1C 5.9* 11/13/2011   Continue metformin.      An After Visit Summary was printed and given to the patient.   FOLLOW UP: 1 mo

## 2011-12-02 ENCOUNTER — Telehealth: Payer: Self-pay | Admitting: Family Medicine

## 2011-12-02 DIAGNOSIS — F17200 Nicotine dependence, unspecified, uncomplicated: Secondary | ICD-10-CM | POA: Insufficient documentation

## 2011-12-02 NOTE — Assessment & Plan Note (Signed)
She has documented cervical spondylosis at C5-6 and C6-7. Poor response to steroid injections at these sites. Patient not satisfied with Dr. Rex Kras lack of aggressiveness regarding further workup or treatment.  She requests referral to another neurosurgeon for a second opinion.  I ordered this referral today. She will continue her chronic pain mgmt (narcotics) with Dr. Francesco Runner who is a colleague of Dr. Carloyn Manner.

## 2011-12-02 NOTE — Assessment & Plan Note (Signed)
I did eRx for nicotrol inhalers and discussed appropriate use of these in order to help her completely quit smoking.

## 2011-12-02 NOTE — Assessment & Plan Note (Signed)
Improved somewhat on Abilify 64m qd.  This seems to have had an activating effect on her. I will go ahead and push the dose to 154mqd.  Continue wellbutrin and remeron.

## 2011-12-02 NOTE — Assessment & Plan Note (Signed)
GAD with agoraphobia. She is not doing so well with this right now. Lots of nausea related to this problem.  Gave zofran for daytime use, save phenergan for night time use.

## 2011-12-02 NOTE — Assessment & Plan Note (Signed)
Good control per home glucose monitoring. Lab Results  Component Value Date   HGBA1C 5.9* 11/13/2011   Continue metformin.

## 2011-12-02 NOTE — Telephone Encounter (Signed)
Message copied by Raechel Chute on Mon Dec 02, 2011  3:18 PM ------      Message from: Tammi Sou      Created: Mon Dec 02, 2011  3:06 PM      Regarding: RE: Neurosurgery Referral       Ok to refer to [facility] in [facility].  thx      -PM      ----- Message -----         From: Raechel Chute         Sent: 12/02/2011  11:02 AM           To: Tammi Sou, MD      Subject: Neurosurgery Referral                                    Good morning Dr. Anitra Lauth,            I am Rise Paganini at the [facility] office, and am helping do referrals for your office.              This patient you are referring to [facility], but they are not in network, nor will they accept her [employer].  The closest in network will be [facility] in [place].  Please advise.

## 2011-12-03 ENCOUNTER — Telehealth: Payer: Self-pay | Admitting: Family Medicine

## 2011-12-03 NOTE — Telephone Encounter (Signed)
Dr. Anitra Lauth, per your referral on this patient to neurosurgery, [facility] from [place] just called and the last MRI CSpine they have is from 2012.  After surgeon, Dr. Mariana Kaufman, reviewed, they must have a more recent MRI of the CSpine within the last 6 months.  They are asking that you please order a new one.

## 2011-12-04 ENCOUNTER — Other Ambulatory Visit: Payer: Self-pay | Admitting: Family Medicine

## 2011-12-04 DIAGNOSIS — M5030 Other cervical disc degeneration, unspecified cervical region: Secondary | ICD-10-CM

## 2011-12-04 DIAGNOSIS — G8929 Other chronic pain: Secondary | ICD-10-CM

## 2011-12-04 NOTE — Telephone Encounter (Signed)
Order done for [facility]. Pls tell pt that neurosurgeon asked that I order this before he would see her.-thx

## 2011-12-04 NOTE — Telephone Encounter (Signed)
Pt notified.  She is agreeable.  Renee,  Pt would like to see if we could do this after 11/4.  She is going out of town.  Best number to reach patient is cell until after the 4th. Thanks!

## 2011-12-06 ENCOUNTER — Telehealth: Payer: Self-pay | Admitting: Family Medicine

## 2011-12-06 NOTE — Telephone Encounter (Signed)
Pt wants pain med RX sent in to pharmacy in [facility].  She is on the road now and unable to come pick anything up.  Advised she will violate contract with pain management if we call any meds in for her.  She states she does not care and they violated the contract first by not telling her they were leaving early today when she called for her refill.  Dr. Francesco Runner refilled but pt was unable to pick up. Pt wants RX for strong hydrocodone.  She will call on Monday to follow up with pharmacy information.

## 2011-12-06 NOTE — Telephone Encounter (Signed)
No.  Patient has drug contract with a pain mgmt physician. Even if she says she does not care if she is breaking the contract, tell her I said no b/c she needs to keep her contract with them until she finds a different pain management MD, because I AM NOT MANAGING HER PAIN, NEITHER IN THE SHORT TERM NOR THE LONG TERM.

## 2011-12-16 NOTE — Telephone Encounter (Signed)
Pt did not return call on 12/09/11 with pharmacy information.  Follow up ended.

## 2011-12-17 NOTE — Telephone Encounter (Signed)
Mri is approved by patient's insurance, WBLT#[id number] to be performed at [facility].  I have left message for patient to return my call about scheduling appointment.

## 2011-12-25 ENCOUNTER — Ambulatory Visit: Payer: Self-pay | Admitting: Family Medicine

## 2011-12-25 NOTE — Telephone Encounter (Signed)
Patient scheduled for MRI at [facility] on 12/26/11, and she is aware.  Will fax results to Neurosurgery office once received.

## 2011-12-26 ENCOUNTER — Ambulatory Visit (HOSPITAL_COMMUNITY)
Admission: RE | Admit: 2011-12-26 | Discharge: 2011-12-26 | Disposition: A | Discharge status: Home / Self Care | Payer: Medicare PPO | Source: Ambulatory Visit | Attending: Family Medicine | Admitting: Family Medicine

## 2011-12-26 DIAGNOSIS — M5030 Other cervical disc degeneration, unspecified cervical region: Secondary | ICD-10-CM

## 2011-12-26 DIAGNOSIS — M546 Pain in thoracic spine: Secondary | ICD-10-CM | POA: Insufficient documentation

## 2011-12-26 DIAGNOSIS — M5020 Other cervical disc displacement, unspecified cervical region: Secondary | ICD-10-CM | POA: Insufficient documentation

## 2011-12-26 DIAGNOSIS — M542 Cervicalgia: Secondary | ICD-10-CM | POA: Insufficient documentation

## 2011-12-26 DIAGNOSIS — M5380 Other specified dorsopathies, site unspecified: Secondary | ICD-10-CM | POA: Insufficient documentation

## 2011-12-26 DIAGNOSIS — G8929 Other chronic pain: Secondary | ICD-10-CM

## 2011-12-27 ENCOUNTER — Encounter: Payer: Self-pay | Admitting: Family Medicine

## 2011-12-27 ENCOUNTER — Ambulatory Visit (INDEPENDENT_AMBULATORY_CARE_PROVIDER_SITE_OTHER): Payer: Medicare PPO | Admitting: Family Medicine

## 2011-12-27 VITALS — BP 115/82 | HR 120 | Ht 65.0 in | Wt 136.0 lb

## 2011-12-27 DIAGNOSIS — B373 Candidiasis of vulva and vagina: Secondary | ICD-10-CM

## 2011-12-27 DIAGNOSIS — R634 Abnormal weight loss: Secondary | ICD-10-CM

## 2011-12-27 DIAGNOSIS — R1310 Dysphagia, unspecified: Secondary | ICD-10-CM

## 2011-12-27 DIAGNOSIS — E119 Type 2 diabetes mellitus without complications: Secondary | ICD-10-CM

## 2011-12-27 DIAGNOSIS — M542 Cervicalgia: Secondary | ICD-10-CM

## 2011-12-27 DIAGNOSIS — M47812 Spondylosis without myelopathy or radiculopathy, cervical region: Secondary | ICD-10-CM

## 2011-12-27 DIAGNOSIS — M79672 Pain in left foot: Secondary | ICD-10-CM

## 2011-12-27 DIAGNOSIS — Z9884 Bariatric surgery status: Secondary | ICD-10-CM

## 2011-12-27 DIAGNOSIS — M79609 Pain in unspecified limb: Secondary | ICD-10-CM

## 2011-12-27 DIAGNOSIS — F3189 Other bipolar disorder: Secondary | ICD-10-CM

## 2011-12-27 DIAGNOSIS — R112 Nausea with vomiting, unspecified: Secondary | ICD-10-CM

## 2011-12-27 DIAGNOSIS — R109 Unspecified abdominal pain: Secondary | ICD-10-CM

## 2011-12-27 DIAGNOSIS — B3731 Acute candidiasis of vulva and vagina: Secondary | ICD-10-CM

## 2011-12-27 LAB — CBC WITH DIFFERENTIAL/PLATELET
Basophils Relative: 0.4 % (ref 0.0–3.0)
Eosinophils Absolute: 0.1 10*3/uL (ref 0.0–0.7)
HCT: 41.3 % (ref 36.0–46.0)
Hemoglobin: 13.3 g/dL (ref 12.0–15.0)
Lymphs Abs: 2.6 10*3/uL (ref 0.7–4.0)
MCHC: 32.3 g/dL (ref 30.0–36.0)
MCV: 94.4 fl (ref 78.0–100.0)
Monocytes Absolute: 0.3 10*3/uL (ref 0.1–1.0)
Neutro Abs: 7.4 10*3/uL (ref 1.4–7.7)
RBC: 4.38 Mil/uL (ref 3.87–5.11)

## 2011-12-27 LAB — COMPREHENSIVE METABOLIC PANEL
Albumin: 3.1 g/dL — ABNORMAL LOW (ref 3.5–5.2)
Alkaline Phosphatase: 115 U/L (ref 39–117)
BUN: 16 mg/dL (ref 6–23)
CO2: 29 mEq/L (ref 19–32)
GFR: 58.48 mL/min — ABNORMAL LOW (ref >60.00)
Glucose, Bld: 127 mg/dL — ABNORMAL HIGH (ref 70–99)
Total Bilirubin: 0.6 mg/dL (ref 0.3–1.2)

## 2011-12-27 LAB — C-REACTIVE PROTEIN: CRP: <0.5 mg/dL (ref 0.5–20.0)

## 2011-12-27 MED ORDER — PANTOPRAZOLE SODIUM 40 MG PO TBEC: 40.0000 mg | DELAYED_RELEASE_TABLET | Freq: Every day | ORAL | Status: DC

## 2011-12-27 MED ORDER — PREDNISONE 20 MG PO TABS: ORAL_TABLET | ORAL | Status: DC

## 2011-12-27 MED ORDER — ZOSTER VACCINE LIVE 19400 UNT/0.65ML ~~LOC~~ SOLR: 0.6500 mL | Freq: Once | SUBCUTANEOUS | Status: DC

## 2011-12-27 NOTE — Progress Notes (Signed)
OFFICE NOTE  01/06/2012  CC:  Chief Complaint  Patient presents with  . Follow-up    chronic pain     HPI: Patient is a 56 y.o. Caucasian female who is here for 1 mo f/u severe depression.  Last visit I increased her abilify from 78m to 17msince she initially showed some improvement on this med as augmentation of her wellbutrin XL 15063md and her remeron 29m63m. Says stopped the abilify 10mg47mn though she noted it helped her mood/energy--she thought it was causing LE swelling bilat, L>R, top of left foot hurt and turned red briefly--this part is resolving somwhat.  No appetiite, has chronic nausea and can't keep anything down, also describes dysphagia.  This has all been worsening in the last month. She stopped her metformin and her insulins.  Glucoses normal or low (50s at times). Bowels moving "ok".  She stopped the iron due to constipation.  No urinary urgency, frequency, or burning. Has slight itchy vag d/c that got better with some OTC intravaginal antifungal but then returned.  Pertinent PMH:  Past Medical History  Diagnosis Date  . Diabetes mellitus     urine protein-Cr ratio nl 09/2010, D.R. Screen neg 10/2009  . Obesity     s/p bariatric surgery  . Hepatic steatosis 2005    ultrasound  . Bipolar disorder     questionable  . Restless legs syndrome   . Insomnia   . Neurosis, anxiety, panic type   . Chronic fatigue   . Iron deficiency anemia 09/2009    malabsorbtion s/p bariatric surgery  . Vitamin D deficiency 09/2009  . Other B-complex deficiencies   . Nephrolithiasis   . Osteopenia 12/2010    Hip T score -2.0.  Spine T score -0.5 (plan to repeat in 2 yrs)  . Colon cancer screening 03/06/2011    iFob 11/2010 through her insurer was negative.   . Tobacco dependence   . Chronic nausea 2012/13    Normal upper GI with SBFT 12/2011 (mild GERD noted)    MEDS:  Outpatient Prescriptions Prior to Visit  Medication Sig Dispense Refill  . aspirin 325 MG EC tablet  Take 325 mg by mouth daily.      . atoMarland Kitchenvastatin (LIPITOR) 20 MG tablet Take 1 tablet (20 mg total) by mouth daily.  30 tablet  6  . buPROPion (WELLBUTRIN XL) 150 MG 24 hr tablet Take 1 tablet (150 mg total) by mouth daily.  30 tablet  6  . calcium elemental as carbonate (TUMS ULTRA 1000) 400 MG tablet Chew 1,000 mg by mouth daily.        . clonazePAM (KLONOPIN) 2 MG tablet Take 1 tablet (2 mg total) by mouth 3 (three) times daily as needed for anxiety.  90 tablet  1  . cyanocobalamin (,VITAMIN B-12,) 1000 MCG/ML injection Inject 1 mL (1,000 mcg total) into the muscle every 14 (fourteen) days.  10 mL  1  . mirtazapine (REMERON) 45 MG tablet Take 1 tablet (45 mg total) by mouth at bedtime.  30 tablet  6  . Multiple Vitamin (MULTIVITAMIN) tablet Take 1 tablet by mouth daily.        . ondansetron (ZOFRAN-ODT) 8 MG disintegrating tablet Take 1 tablet (8 mg total) by mouth every 8 (eight) hours as needed for nausea.  30 tablet  2  . ONE TOUCH ULTRA TEST test strip CHECK BLOOD SUGAR TWO TIMES DAILY AS DIRECTED.  100 each  5  . ONETOUCH DELICA LANCETS  MISC Check glucose bid  60 each  6  . oxycodone (OXYCONTIN) 30 MG TB12 Take 30 mg by mouth 3 (three) times daily.      . pramipexole (MIRAPEX) 0.125 MG tablet 4-5 tabs po qhs  120 tablet  6  . promethazine (PHENERGAN) 12.5 MG tablet Take 1-2 tablet every 6 hours as needed for nausea  60 tablet  2  . SYRINGE-NEEDLE, DISP, 3 ML (B-D 3CC LUER-LOK SYR 23GX1") 23G X 1" 3 ML MISC Use as directed to inject B12  10 each  1  . tiZANidine (ZANAFLEX) 4 MG tablet Take 1 tablet by mouth Three times a day.      . Vitamin D, Ergocalciferol, (DRISDOL) 50000 UNITS CAPS Take 1 capsule (50,000 Units total) by mouth every 7 (seven) days.  4 capsule  3  . amphetamine-dextroamphetamine (ADDERALL) 10 MG tablet 1 tab po bid  60 tablet  0  . ARIPiprazole (ABILIFY) 10 MG tablet Take 1 tablet (10 mg total) by mouth daily.  30 tablet  1  . ferrous sulfate 325 (65 FE) MG tablet Take 325  mg by mouth daily.        . Insulin Aspart (NOVOLOG FLEXPEN [place]) Inject 5 Units into the skin 3 (three) times daily. With each meal      . insulin detemir (LEVEMIR FLEXPEN) 100 UNIT/ML injection Inject 8 Units into the skin at bedtime.       . metFORMIN (GLUCOPHAGE) 1000 MG tablet Take 1 tablet (1,000 mg total) by mouth 2 (two) times daily with a meal.  60 tablet  6  . mupirocin ointment (BACTROBAN) 2 % Apply topically 3 (three) times daily.  15 g  1  . NOVOFINE 32G X 6 MM MISC USE AS DIRECTED.  100 each  2  . oxyCODONE (ROXICODONE) 15 MG immediate release tablet Take 1 tablet (15 mg total) by mouth every 6 (six) hours as needed for pain.  120 tablet  0  . nicotine (NICOTROL) 10 MG inhaler 6-16 cartridges per day for 12 weeks  42 each  6    PE: Blood pressure 115/82, pulse 120, height 5' 5"  (1.651 m), weight 136 lb (61.689 kg). Gen: Alert, well appearing.  Patient is oriented to person, place, time, and situation. CV: RRR LUNGS: CTA bilat, nonlabored ABD: soft, ND.  Mild diffuse TTP all over abdomen as per past exams.  No guarding or rebound.  BS normal. Left foot: focal dorsal surface tenderness over 4th distal metatarsal region.  No warmth or erythema.  IMPRESSION AND PLAN:  ABDOMINAL PAIN, RECURRENT She has a multitude of chronic GI sx's-predominantly upper GI: need to assess further with upper GI barium study.   Start pantoprazole 69m qd.  Check CBC, CMET, lipase, CRP. Stop atorvastatin for the short term to make sure this is not contributing/making things worse.  BIPOLAR II DISORDER Still unstable.  Need to get back on abilify: restart at 555mqd dosing and increase to 1083md after a week or so.  Type II or unspecified type diabetes mellitus without mention of complication, not stated as uncontrolled Doing ok at this time off all meds.  Yeast vaginitis Diflucan 150m44m x 1d, then repeat 1 week later.  Cervical pain Cervical spondylosis: mild/mod--no significant change on  recent MRI c-spine. She requests referral to new neurosurgeon so I have ordered this today.  Foot pain, left Question of gout: check uric acid. Prednisone 40mg79mx 5d rx'd.     FOLLOW UP: 4  wks

## 2012-01-01 ENCOUNTER — Encounter: Payer: Self-pay | Admitting: Family Medicine

## 2012-01-01 ENCOUNTER — Ambulatory Visit (HOSPITAL_COMMUNITY)
Admission: RE | Admit: 2012-01-01 | Discharge: 2012-01-01 | Disposition: A | Discharge status: Home / Self Care | Payer: Medicare PPO | Source: Ambulatory Visit | Attending: Family Medicine | Admitting: Family Medicine

## 2012-01-01 ENCOUNTER — Other Ambulatory Visit: Payer: Self-pay | Admitting: Family Medicine

## 2012-01-01 ENCOUNTER — Telehealth: Payer: Self-pay | Admitting: *Deleted

## 2012-01-01 DIAGNOSIS — R634 Abnormal weight loss: Secondary | ICD-10-CM | POA: Insufficient documentation

## 2012-01-01 DIAGNOSIS — R112 Nausea with vomiting, unspecified: Secondary | ICD-10-CM

## 2012-01-01 DIAGNOSIS — K219 Gastro-esophageal reflux disease without esophagitis: Secondary | ICD-10-CM | POA: Insufficient documentation

## 2012-01-01 DIAGNOSIS — R1310 Dysphagia, unspecified: Secondary | ICD-10-CM

## 2012-01-01 DIAGNOSIS — K449 Diaphragmatic hernia without obstruction or gangrene: Secondary | ICD-10-CM | POA: Insufficient documentation

## 2012-01-01 DIAGNOSIS — Z9884 Bariatric surgery status: Secondary | ICD-10-CM

## 2012-01-01 MED ORDER — INDOMETHACIN 50 MG PO CAPS: ORAL_CAPSULE | ORAL | Status: DC

## 2012-01-01 NOTE — Telephone Encounter (Signed)
For foot pain, I'll continue treatment for gout with a different med but if not improved significantly in 5d then return to office for recheck (I'll eRx indocin.  Tell her not to take any ibuprofen or alleve when she is taking the indocin). For her nausea and abd pain, I want her to have a CT scan--I'll place order for [facility] once she gives me the go-ahead.--thx

## 2012-01-01 NOTE — Telephone Encounter (Signed)
PC to pt to discuss upper GI results.  She is still having stomach issues.  She is taking pantoprazole prior to eating.  Steroid gave her a little bit more of an appetite, but she is still only eating chicken noodle soup.  Her goal is two cans daily.  Also still having some pain and swelling after finishing steroid for gout.  Wants to know if this is normal.  Should pain be getting better?    Please advise.

## 2012-01-02 ENCOUNTER — Telehealth: Payer: Self-pay | Admitting: Family Medicine

## 2012-01-02 MED ORDER — FLUCONAZOLE 150 MG PO TABS: 150.0000 mg | ORAL_TABLET | Freq: Once | ORAL | Status: DC

## 2012-01-02 NOTE — Telephone Encounter (Signed)
RX has been sent.  Pt has option of not picking up.

## 2012-01-02 NOTE — Telephone Encounter (Signed)
Notified of below. She is agreeable with indocin and does not usually take ibuprofen or alleve.  She will hold off on CT as she was able to hold down food last night.  She will call on Tuesday with update on both.   States pharmacy does not have RX for yeast infection.  Review of med list does not show diflucan RX.  Per Dr. Anitra Lauth ok to call in.
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
## 2012-01-06 DIAGNOSIS — M79672 Pain in left foot: Secondary | ICD-10-CM | POA: Insufficient documentation

## 2012-01-06 NOTE — Assessment & Plan Note (Addendum)
She has a multitude of chronic GI sx's-predominantly upper GI: need to assess further with upper GI barium study.   Start pantoprazole 95m qd.  Check CBC, CMET, lipase, CRP. Stop atorvastatin for the short term to make sure this is not contributing/making things worse.

## 2012-01-06 NOTE — Assessment & Plan Note (Addendum)
Diflucan 160m qd x 1d, then repeat 1 week later.

## 2012-01-06 NOTE — Assessment & Plan Note (Signed)
Cervical spondylosis: mild/mod--no significant change on recent MRI c-spine. She requests referral to new neurosurgeon so I have ordered this today.

## 2012-01-06 NOTE — Assessment & Plan Note (Signed)
Still unstable.  Need to get back on abilify: restart at 40m qd dosing and increase to 160mqd after a week or so.

## 2012-01-06 NOTE — Assessment & Plan Note (Signed)
Question of gout: check uric acid. Prednisone 7m qd x 5d rx'd.

## 2012-01-06 NOTE — Assessment & Plan Note (Signed)
Doing ok at this time off all meds.

## 2012-01-20 ENCOUNTER — Ambulatory Visit (HOSPITAL_COMMUNITY): Payer: Self-pay | Admitting: Physical Therapy

## 2012-01-22 ENCOUNTER — Ambulatory Visit (HOSPITAL_COMMUNITY)
Admission: RE | Admit: 2012-01-22 | Discharge: 2012-01-22 | Disposition: A | Discharge status: Home / Self Care | Payer: Medicare PPO | Source: Ambulatory Visit | Attending: Neurosurgery | Admitting: Neurosurgery

## 2012-01-22 DIAGNOSIS — IMO0001 Reserved for inherently not codable concepts without codable children: Secondary | ICD-10-CM | POA: Insufficient documentation

## 2012-01-22 DIAGNOSIS — E119 Type 2 diabetes mellitus without complications: Secondary | ICD-10-CM | POA: Insufficient documentation

## 2012-01-22 DIAGNOSIS — R209 Unspecified disturbances of skin sensation: Secondary | ICD-10-CM | POA: Insufficient documentation

## 2012-01-22 DIAGNOSIS — M542 Cervicalgia: Secondary | ICD-10-CM | POA: Insufficient documentation
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
## 2012-01-27 ENCOUNTER — Encounter: Payer: Self-pay | Admitting: Family Medicine

## 2012-01-27 ENCOUNTER — Ambulatory Visit (INDEPENDENT_AMBULATORY_CARE_PROVIDER_SITE_OTHER): Payer: Medicare PPO | Admitting: Family Medicine

## 2012-01-27 ENCOUNTER — Ambulatory Visit (HOSPITAL_COMMUNITY): Payer: Self-pay | Admitting: Physical Therapy

## 2012-01-27 VITALS — BP 91/66 | HR 94 | Ht 65.0 in | Wt 136.0 lb

## 2012-01-27 DIAGNOSIS — F3189 Other bipolar disorder: Secondary | ICD-10-CM

## 2012-01-27 DIAGNOSIS — R1310 Dysphagia, unspecified: Secondary | ICD-10-CM

## 2012-01-27 DIAGNOSIS — R110 Nausea: Secondary | ICD-10-CM

## 2012-01-27 DIAGNOSIS — M542 Cervicalgia: Secondary | ICD-10-CM
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
## 2012-01-29 ENCOUNTER — Ambulatory Visit (HOSPITAL_COMMUNITY): Payer: Self-pay

## 2012-01-29 ENCOUNTER — Encounter (INDEPENDENT_AMBULATORY_CARE_PROVIDER_SITE_OTHER): Payer: Self-pay | Admitting: *Deleted

## 2012-01-30 ENCOUNTER — Encounter: Payer: Self-pay | Admitting: Family Medicine

## 2012-01-30 DIAGNOSIS — R1310 Dysphagia, unspecified: Secondary | ICD-10-CM | POA: Insufficient documentation

## 2012-01-30 DIAGNOSIS — R110 Nausea: Secondary | ICD-10-CM | POA: Insufficient documentation

## 2012-01-30 NOTE — Assessment & Plan Note (Addendum)
With waxing and waning dysphagia and vomiting. Stress/emotional state is certainly a factor but not likely the sole underlying cause. Recent upper GI barium study unremarkable, UGI w/ SBFT normal 03/2010, she is s/p open cholecystectomy 04/2010, CT scans of abd/pelvis both before and after this procedure showed no abnormality that would be pertinent to her ongoing complaints now.  Her response to PPI and antiemetics has been moderate at best. We agreed that it was best to proceed with GI referral at this time for further expert evaluation and management.

## 2012-01-30 NOTE — Assessment & Plan Note (Signed)
Not ideally controlled, but pt unable to be compliant with med due to cost. She is going to stick with her wellbutrin, remeron, and clonazepam for now.  Restart abilify when financially able.

## 2012-01-30 NOTE — Assessment & Plan Note (Signed)
Cervical spondylosis with chronic neck pain and radiculopathy pain. Will see if Dr. Rex Kras office will agree to accept her back as a patient. She'll continue current pain mgmt strategies via Dr. Francesco Runner.

## 2012-02-03 ENCOUNTER — Ambulatory Visit (HOSPITAL_COMMUNITY): Payer: Self-pay | Admitting: *Deleted

## 2012-02-06 ENCOUNTER — Ambulatory Visit (HOSPITAL_COMMUNITY): Payer: Self-pay

## 2012-02-10 ENCOUNTER — Ambulatory Visit (HOSPITAL_COMMUNITY): Payer: Self-pay | Admitting: *Deleted

## 2012-02-13 ENCOUNTER — Ambulatory Visit (HOSPITAL_COMMUNITY): Payer: Self-pay | Admitting: *Deleted

## 2012-02-15 ENCOUNTER — Other Ambulatory Visit: Payer: Self-pay | Admitting: Family Medicine

## 2012-02-17 ENCOUNTER — Ambulatory Visit (HOSPITAL_COMMUNITY): Payer: Self-pay | Admitting: Physical Therapy

## 2012-02-17 NOTE — Telephone Encounter (Signed)
eScribe request for refill on CLONAZEPAM Last filled - 11/13/11, #90 X 1  Last seen on - 01/27/12 Follow up - 3 MONTHS Please advise refills

## 2012-02-17 NOTE — Telephone Encounter (Signed)
RX faxed

## 2012-02-19 ENCOUNTER — Ambulatory Visit (HOSPITAL_COMMUNITY): Payer: Self-pay

## 2012-02-19 ENCOUNTER — Ambulatory Visit (INDEPENDENT_AMBULATORY_CARE_PROVIDER_SITE_OTHER): Payer: Self-pay | Admitting: Internal Medicine

## 2012-02-21 ENCOUNTER — Ambulatory Visit (HOSPITAL_COMMUNITY): Payer: Self-pay

## 2012-04-24 ENCOUNTER — Ambulatory Visit: Payer: Medicare PPO | Admitting: Family Medicine

## 2012-04-26 ENCOUNTER — Encounter: Payer: Self-pay | Admitting: Family Medicine

## 2012-04-27 ENCOUNTER — Ambulatory Visit: Payer: Medicare PPO | Admitting: Family Medicine

## 2012-04-27 ENCOUNTER — Telehealth: Payer: Self-pay | Admitting: *Deleted

## 2012-04-27 MED ORDER — CLINDAMYCIN HCL 300 MG PO CAPS: ORAL_CAPSULE | ORAL | Status: DC

## 2012-04-27 NOTE — Telephone Encounter (Signed)
Pt is having all teeth extracted on 04/30/12 and her pain management MD, Dr. Francesco Runner, is requesting that pt have antibiotics to prevent infection prior to epidural she is having on 05/19/12.  Please advise. [facility].

## 2012-04-27 NOTE — Telephone Encounter (Signed)
Prophylaxis for dental extraction --clindamycin 633m 1 hr prior to procedure rx'd.  This was requested by pt--she was told by her pain mgmt MD that she needed this b/c a few days after the dental extraction she will be having an epidural injection.

## 2012-04-27 NOTE — Telephone Encounter (Signed)
Pt notified RX has been called in.

## 2012-04-28 ENCOUNTER — Ambulatory Visit: Payer: Medicare PPO | Admitting: Family Medicine

## 2012-05-11 ENCOUNTER — Ambulatory Visit: Payer: Medicare PPO | Admitting: Family Medicine

## 2012-05-12 ENCOUNTER — Ambulatory Visit: Payer: Medicare PPO | Admitting: Family Medicine

## 2012-05-13 ENCOUNTER — Ambulatory Visit: Payer: Medicare PPO | Admitting: Family Medicine

## 2012-05-17 ENCOUNTER — Other Ambulatory Visit: Payer: Self-pay | Admitting: Family Medicine

## 2012-05-19 NOTE — Telephone Encounter (Signed)
eScribe request for refill on VITAMIN D 50K UNITS Last filled - 11/13/11, #4 X 3 Last seen on - 01/27/12 Last Vitamin D check was 07/04/11 Follow up - 3 MONTHS, pt has appt 05/22/12 Does pt need to continue this med?  Please advise refills.

## 2012-05-19 NOTE — Telephone Encounter (Signed)
Pt has taken the routine replacement for vit D.  I recommend switching to an OTC Ergocalciferol tab 2000 IU and take one daily.  We'll recheck vit D level next time she is here.-thx

## 2012-05-22 ENCOUNTER — Ambulatory Visit (INDEPENDENT_AMBULATORY_CARE_PROVIDER_SITE_OTHER): Payer: Medicare PPO | Admitting: Family Medicine

## 2012-05-22 ENCOUNTER — Encounter: Payer: Self-pay | Admitting: Family Medicine

## 2012-05-22 VITALS — BP 116/70 | HR 100 | Temp 98.5°F | Ht 65.0 in | Wt 156.5 lb

## 2012-05-22 DIAGNOSIS — E119 Type 2 diabetes mellitus without complications: Secondary | ICD-10-CM

## 2012-05-22 DIAGNOSIS — E559 Vitamin D deficiency, unspecified: Secondary | ICD-10-CM

## 2012-05-22 MED ORDER — VARENICLINE TARTRATE 1 MG PO TABS: 1.0000 mg | ORAL_TABLET | Freq: Two times a day (BID) | ORAL | Status: DC

## 2012-05-22 MED ORDER — MAGIC MOUTHWASH W/LIDOCAINE: ORAL | Status: DC

## 2012-05-22 MED ORDER — VARENICLINE TARTRATE 0.5 MG X 11 & 1 MG X 42 PO MISC: ORAL | Status: DC

## 2012-05-22 NOTE — Telephone Encounter (Signed)
Will advise pt at appt today.

## 2012-05-24 MED ORDER — METFORMIN HCL 500 MG PO TABS: 500.0000 mg | ORAL_TABLET | Freq: Two times a day (BID) | ORAL | Status: DC

## 2012-05-30 MED ORDER — CYANOCOBALAMIN 1000 MCG/ML IJ SOLN: 1000.0000 ug | INTRAMUSCULAR | Status: DC

## 2012-05-30 NOTE — Progress Notes (Signed)
OFFICE NOTE  05/30/2012  CC:  Chief Complaint  Patient presents with  . Follow-up    DM 2     HPI: Patient is a 57 y.o. Caucasian female who is here for 3 mo f/u chronic illnesses/polypharmacy. Psych: depression lifting a little, more stable functioning through all her anxiety lately. GI: "things straightened up a lot (re" chronic upper GI c/o + n/v, for which our radiologic w/u was unrevealing), so she decided not to go to GI referral. Pain: most recent cervical epidural steroid injection was 3 d/a, still very bad pain and doesn't seem to respond to these well, still getting pain meds managed by Dr. Francesco Runner. DM 2: says rare checks of glucose is 70-110.  Currently on diet-only (stopped orals and insulin b/c control became so good). Tob dependence: still smoking, practically begs to start chantix trial since this helped her in the past.  She says she understands well the possible psych side effects of this med in some people.  Also of note: she has had all of her teeth extracted since our last f/u, plans for dentures in future.  Pertinent PMH:  Past Medical History  Diagnosis Date  . Diabetes mellitus     urine protein-Cr ratio nl 09/2010, D.R. Screen neg 10/2009  . Obesity     s/p bariatric surgery  . Hepatic steatosis 2005    ultrasound  . Bipolar disorder     questionable  . Restless legs syndrome   . Insomnia   . Neurosis, anxiety, panic type   . Chronic fatigue   . Iron deficiency anemia 09/2009    malabsorbtion s/p bariatric surgery  . Vitamin D deficiency 09/2009  . Other B-complex deficiencies   . Nephrolithiasis   . Osteopenia 12/2010    Hip T score -2.0.  Spine T score -0.5 (plan to repeat in 2 yrs)  . Colon cancer screening 03/06/2011    iFob 11/2010 through her insurer was negative.   . Tobacco dependence   . Chronic nausea 2012/13    Normal upper GI  12/2011 (mild GERD noted +small hiatal hernia)+   Past Surgical History  Procedure Laterality Date  .  Roux-en-y gastric bypass    . Tonsillectomy    . Abdominal hysterectomy      fibroids, ovaries remain  . Bladder surgery      bladder tack  . Appendectomy    . Hernia repair  2005    mesenteric hernia repair, with lysis of adhesions (presented with SBO)  . Cholecystectomy  04/2010    w/lysis of adhesions (open procedure)--path showed chronic cholecystitis and cholesterol polyp.  Marland Kitchen Epidural block injection  03/2011    Cervical    MEDS:  Outpatient Prescriptions Prior to Visit  Medication Sig Dispense Refill  . aspirin 325 MG EC tablet Take 325 mg by mouth daily.      Marland Kitchen buPROPion (WELLBUTRIN XL) 150 MG 24 hr tablet Take 1 tablet (150 mg total) by mouth daily.  30 tablet  6  . calcium elemental as carbonate (TUMS ULTRA 1000) 400 MG tablet Chew 1,000 mg by mouth daily.        . clonazePAM (KLONOPIN) 2 MG tablet TALE 1 TABLET BY MOUTH THREE TIMES A DAY AS NEEDED FOR ANXIETY  90 tablet  5  . cyanocobalamin (,VITAMIN B-12,) 1000 MCG/ML injection Inject 1 mL (1,000 mcg total) into the muscle every 14 (fourteen) days.  10 mL  1  . ferrous sulfate 325 (65 FE) MG tablet Take  325 mg by mouth daily.        Marland Kitchen gabapentin (NEURONTIN) 300 MG capsule Take 2 capsules by mouth at bedtime.      . mirtazapine (REMERON) 45 MG tablet Take 1 tablet (45 mg total) by mouth at bedtime.  30 tablet  6  . Multiple Vitamin (MULTIVITAMIN) tablet Take 1 tablet by mouth daily.        . mupirocin ointment (BACTROBAN) 2 % Apply topically 3 (three) times daily.  15 g  1  . NOVOFINE 32G X 6 MM MISC USE AS DIRECTED.  100 each  2  . ondansetron (ZOFRAN-ODT) 8 MG disintegrating tablet Take 1 tablet (8 mg total) by mouth every 8 (eight) hours as needed for nausea.  30 tablet  2  . oxycodone (OXYCONTIN) 30 MG TB12 Take 30 mg by mouth 3 (three) times daily.      Marland Kitchen oxyCODONE (ROXICODONE) 15 MG immediate release tablet Take 1 tablet (15 mg total) by mouth every 6 (six) hours as needed for pain.  120 tablet  0  . pantoprazole  (PROTONIX) 40 MG tablet Take 1 tablet (40 mg total) by mouth daily.  30 tablet  3  . pramipexole (MIRAPEX) 0.125 MG tablet 4-5 tabs po qhs  120 tablet  6  . promethazine (PHENERGAN) 12.5 MG tablet Take 1-2 tablet every 6 hours as needed for nausea  60 tablet  2  . tiZANidine (ZANAFLEX) 4 MG tablet Take 1 tablet by mouth Three times a day.      . Vitamin D, Ergocalciferol, (DRISDOL) 50000 UNITS CAPS Take 1 capsule (50,000 Units total) by mouth every 7 (seven) days.  4 capsule  3  . ARIPiprazole (ABILIFY) 10 MG tablet Take 1 tablet (10 mg total) by mouth daily.  30 tablet  1  . clindamycin (CLEOCIN) 300 MG capsule 2 caps po 1 hr prior to dental procedure  2 capsule  0  . indomethacin (INDOCIN) 50 MG capsule 1 cap tid with food as needed for gout flares  15 capsule  3  . ONE TOUCH ULTRA TEST test strip CHECK BLOOD SUGAR TWO TIMES DAILY AS DIRECTED.  100 each  5  . ONETOUCH DELICA LANCETS MISC Check glucose bid  60 each  6  . SYRINGE-NEEDLE, DISP, 3 ML (B-D 3CC LUER-LOK SYR 23GX1") 23G X 1" 3 ML MISC Use as directed to inject B12  10 each  1  . zoster vaccine live, PF, (ZOSTAVAX) 54008 UNT/0.65ML injection Inject 19,400 Units into the skin once.  1 vial  0  . Insulin Aspart (NOVOLOG FLEXPEN Bode) Inject 5 Units into the skin 3 (three) times daily. With each meal      . insulin detemir (LEVEMIR FLEXPEN) 100 UNIT/ML injection Inject 8 Units into the skin at bedtime.       . metFORMIN (GLUCOPHAGE) 1000 MG tablet Take 1 tablet (1,000 mg total) by mouth 2 (two) times daily with a meal.  60 tablet  6   No facility-administered medications prior to visit.    PE: Blood pressure 116/70, pulse 100, temperature 98.5 F (36.9 C), temperature source Oral, height 5' 5"  (1.651 m), weight 156 lb 8 oz (70.988 kg), SpO2 97.00%. Gen: Alert, well appearing.  Patient is oriented to person, place, time, and situation. AFFECT: pleasant, lucid thought and speech. No further exam today.   IMPRESSION AND PLAN:  1)  Chronic depression/anxiety: fairly stable, no changes made today. 2) Chronic n/v: improved--highly connected to psychological state.  She chose  to cancel GI referral. Re: malabsorption syndrome from her prior gastric bipass surgery, will recheck vit D since she has been taking replacement dosing.  Will also change her vit b12 rx to reflect recent change in dosing to q2 wks instead of q month. 3) Chronic pain: still poor control.  Continue with pain mgmt MD, periodic injections. 4) DM 2, stable per her occasional home monitoring.  HbA1c today.  Reminded her once again of her need for diabetic retinopathy exam. 5) Tob dependence: Chantix starter pack today, with rx for maintenance dose sent to pharmacy.  Therapeutic expectations and side effect profile of medication discussed today.  Patient's questions answered.   FOLLOW UP: 80mo

## 2012-06-01 ENCOUNTER — Encounter: Payer: Self-pay | Admitting: *Deleted

## 2012-06-25 ENCOUNTER — Other Ambulatory Visit: Payer: Self-pay | Admitting: Family Medicine

## 2012-06-25 MED ORDER — VITAMIN D3 50 MCG (2000 UT) PO CAPS: 2000.0000 [IU] | ORAL_CAPSULE | Freq: Every day | ORAL | Status: DC

## 2012-06-25 NOTE — Telephone Encounter (Signed)
Pt is to be taking OTC Vitamin D# 2000 units QD per provider; sent to pharmacy at pt request/SLS

## 2012-07-15 ENCOUNTER — Other Ambulatory Visit: Payer: Self-pay | Admitting: Family Medicine

## 2012-07-16 ENCOUNTER — Telehealth: Payer: Self-pay | Admitting: *Deleted

## 2012-07-16 NOTE — Telephone Encounter (Signed)
Fax request for refill on GABAPENTIN  Last filled - 12.02.13 Historical Entry only, no past refill in EMR  Last seen on - 04.11.14  Follow up - 4 MONTHS  Please advise refills/SLS

## 2012-07-16 NOTE — Telephone Encounter (Signed)
eScribe request for refill on MIRAPEX  Last filled - 11/13/11, #120 X 6  Last seen on - 04.11.14  Follow up - 4 MONTHS [Patient also has pending request for GABAPENTIN in separate note]   eScribe request for refill on [facility]  Last filled - 11/18/11, #30 X 6  Last seen on - 04.11.14 Follow up - 4 MONTHS   Please advise refills/SLS       Please advise refills

## 2012-07-17 MED ORDER — GABAPENTIN 300 MG PO CAPS: 600.0000 mg | ORAL_CAPSULE | Freq: Every day | ORAL | Status: DC

## 2012-07-17 NOTE — Telephone Encounter (Signed)
rx sent

## 2012-07-28 ENCOUNTER — Other Ambulatory Visit: Payer: Self-pay | Admitting: Family Medicine

## 2012-07-28 MED ORDER — BUPROPION HCL ER (XL) 150 MG PO TB24: ORAL_TABLET | ORAL | Status: DC

## 2012-07-28 MED ORDER — MIRTAZAPINE 45 MG PO TABS: 45.0000 mg | ORAL_TABLET | Freq: Every day | ORAL | Status: DC

## 2012-07-28 MED ORDER — PRAMIPEXOLE DIHYDROCHLORIDE 0.125 MG PO TABS: ORAL_TABLET | ORAL | Status: DC

## 2012-07-28 NOTE — Telephone Encounter (Signed)
Patient wants med refills, patient was last seen 05/22/12 follow up is 09/21/2012.  Please advise.

## 2012-07-29 ENCOUNTER — Ambulatory Visit (HOSPITAL_COMMUNITY): Payer: Self-pay | Admitting: Physical Therapy

## 2012-08-20 ENCOUNTER — Other Ambulatory Visit: Payer: Self-pay

## 2012-08-21 ENCOUNTER — Telehealth: Payer: Self-pay | Admitting: Family Medicine

## 2012-08-21 MED ORDER — ALBUTEROL SULFATE HFA 108 (90 BASE) MCG/ACT IN AERS: 2.0000 | INHALATION_SPRAY | RESPIRATORY_TRACT | Status: DC | PRN

## 2012-08-21 NOTE — Telephone Encounter (Signed)
Told patient per Dr. Anitra Lauth that he would send in an inhaler for her and he advised her to use robitusin DM.  Patient understood and will pick up medications per dr. Kayleen Memos.

## 2012-08-24 ENCOUNTER — Ambulatory Visit: Payer: Medicare PPO | Admitting: Family Medicine

## 2012-08-26 ENCOUNTER — Encounter: Payer: Self-pay | Admitting: Family Medicine

## 2012-08-26 ENCOUNTER — Ambulatory Visit (INDEPENDENT_AMBULATORY_CARE_PROVIDER_SITE_OTHER): Payer: Medicare PPO | Admitting: Family Medicine

## 2012-08-26 VITALS — BP 126/77 | HR 104 | Temp 98.6°F | Resp 16 | Ht 62.0 in | Wt 162.0 lb

## 2012-08-26 DIAGNOSIS — F411 Generalized anxiety disorder: Secondary | ICD-10-CM

## 2012-08-26 DIAGNOSIS — F17200 Nicotine dependence, unspecified, uncomplicated: Secondary | ICD-10-CM

## 2012-08-26 DIAGNOSIS — F3189 Other bipolar disorder: Secondary | ICD-10-CM

## 2012-08-26 DIAGNOSIS — E119 Type 2 diabetes mellitus without complications: Secondary | ICD-10-CM

## 2012-08-26 NOTE — Progress Notes (Signed)
OFFICE NOTE  08/26/2012  CC:  Chief Complaint  Patient presents with  . Diabetes     HPI: Patient is a 57 y.o. Caucasian female who is here for 3 mo f/u DM 2, tobacco dependence, and hx of recurrent major depression and chronic anxiety d/o. Last visit we started chantix for smoking cessation aid plus her HbA1c was up a bit to 7.3% so I recommended she restart metformin 500 mg bid. She is going to have surgery on her C-spine 09/21/12--Dr. Carloyn Manner [facility]).  She has successfully quit smoking.  She is finishing her last 2 wks of chantix.  It did cause her some increased depression and anxiety but she says "nothing I couldn't handle".  CBGs: 2 H PP avg 160.  No fasting glucose checks.  She is not eating on a good/regular/scheduled basis b/c she is still getting used to her dentures. She feels like she is doing better regarding dietary choices and portion sizes, though, whereas prior to last HbA1c when she was w/out teeth she was "living off of" sugary pudding. She says she occasionally will take an extra 567m tab if sugar is up some.  Feet: no tingling, numbness, or burning or other pains in feet.  Pertinent PMH:  Past Medical History  Diagnosis Date  . Diabetes mellitus     urine protein-Cr ratio nl 09/2010, D.R. Screen neg 10/2009  . Obesity     s/p bariatric surgery  . Hepatic steatosis 2005    ultrasound  . Bipolar disorder     questionable  . Restless legs syndrome   . Insomnia   . Neurosis, anxiety, panic type   . Chronic fatigue   . Iron deficiency anemia 09/2009    malabsorbtion s/p bariatric surgery  . Vitamin D deficiency 09/2009  . Other B-complex deficiencies   . Nephrolithiasis   . Osteopenia 12/2010    Hip T score -2.0.  Spine T score -0.5 (plan to repeat in 2 yrs)  . Colon cancer screening 03/06/2011    iFob 11/2010 through her insurer was negative.   . Tobacco dependence   . Chronic nausea 2012/13    Normal upper GI  12/2011 (mild GERD noted +small hiatal  hernia)+   Past surgical, social, and family history reviewed and no changes noted since last office visit.  MEDS:  Outpatient Prescriptions Prior to Visit  Medication Sig Dispense Refill  . albuterol (PROVENTIL HFA;VENTOLIN HFA) 108 (90 BASE) MCG/ACT inhaler Inhale 2 puffs into the lungs every 4 (four) hours as needed for wheezing (cough, shortness of breath or wheezing.).  1 Inhaler  0  . Alum & Mag Hydroxide-Simeth (MAGIC MOUTHWASH W/LIDOCAINE) SOLN 1 tsp swish and spit qAC and qhs prn  120 mL  2  . ARIPiprazole (ABILIFY) 10 MG tablet Take 1 tablet (10 mg total) by mouth daily.  30 tablet  1  . aspirin 325 MG EC tablet Take 325 mg by mouth daily.      .Marland KitchenbuPROPion (WELLBUTRIN XL) 150 MG 24 hr tablet TAKE 1 TABLET (150 MG TOTAL) BY MOUTH DAILY.  30 tablet  6  . calcium elemental as carbonate (TUMS ULTRA 1000) 400 MG tablet Chew 1,000 mg by mouth daily.        . Cholecalciferol (VITAMIN D3) 2000 UNITS capsule Take 1 capsule (2,000 Units total) by mouth daily.  30 capsule  2  . clindamycin (CLEOCIN) 300 MG capsule 2 caps po 1 hr prior to dental procedure  2 capsule  0  .  clonazePAM (KLONOPIN) 2 MG tablet TALE 1 TABLET BY MOUTH THREE TIMES A DAY AS NEEDED FOR ANXIETY  90 tablet  5  . cyanocobalamin (,VITAMIN B-12,) 1000 MCG/ML injection Inject 1 mL (1,000 mcg total) into the muscle every 14 (fourteen) days.  10 mL  3  . ferrous sulfate 325 (65 FE) MG tablet Take 325 mg by mouth daily.        Marland Kitchen gabapentin (NEURONTIN) 300 MG capsule Take 2 capsules (600 mg total) by mouth at bedtime.  60 capsule  5  . indomethacin (INDOCIN) 50 MG capsule 1 cap tid with food as needed for gout flares  15 capsule  3  . metFORMIN (GLUCOPHAGE) 500 MG tablet Take 1 tablet (500 mg total) by mouth 2 (two) times daily with a meal.  180 tablet  3  . mirtazapine (REMERON) 45 MG tablet Take 1 tablet (45 mg total) by mouth at bedtime.  30 tablet  6  . Multiple Vitamin (MULTIVITAMIN) tablet Take 1 tablet by mouth daily.         . mupirocin ointment (BACTROBAN) 2 % Apply topically 3 (three) times daily.  15 g  1  . NOVOFINE 32G X 6 MM MISC USE AS DIRECTED.  100 each  2  . ondansetron (ZOFRAN-ODT) 8 MG disintegrating tablet Take 1 tablet (8 mg total) by mouth every 8 (eight) hours as needed for nausea.  30 tablet  2  . ONE TOUCH ULTRA TEST test strip CHECK BLOOD SUGAR TWO TIMES DAILY AS DIRECTED.  100 each  5  . ONETOUCH DELICA LANCETS MISC Check glucose bid  60 each  6  . oxycodone (OXYCONTIN) 30 MG TB12 Take 30 mg by mouth 3 (three) times daily.      Marland Kitchen oxyCODONE (ROXICODONE) 15 MG immediate release tablet Take 1 tablet (15 mg total) by mouth every 6 (six) hours as needed for pain.  120 tablet  0  . pantoprazole (PROTONIX) 40 MG tablet Take 1 tablet (40 mg total) by mouth daily.  30 tablet  3  . pramipexole (MIRAPEX) 0.125 MG tablet TAKE 4-5 TABLETS BY MOUTH AT BEDTIME  120 tablet  6  . promethazine (PHENERGAN) 12.5 MG tablet Take 1-2 tablet every 6 hours as needed for nausea  60 tablet  2  . SYRINGE-NEEDLE, DISP, 3 ML (B-D 3CC LUER-LOK SYR 23GX1") 23G X 1" 3 ML MISC Use as directed to inject B12  10 each  1  . tiZANidine (ZANAFLEX) 4 MG tablet Take 1 tablet by mouth Three times a day.      . varenicline (CHANTIX CONTINUING MONTH PAK) 1 MG tablet Take 1 tablet (1 mg total) by mouth 2 (two) times daily.  60 tablet  2  . varenicline (CHANTIX STARTING MONTH PAK) 0.5 MG X 11 & 1 MG X 42 tablet Take one 0.5 mg tablet by mouth once daily for 3 days, then increase to one 0.5 mg tablet twice daily for 4 days, then increase to one 1 mg tablet twice daily.  53 tablet  0  . Vitamin D, Ergocalciferol, (DRISDOL) 50000 UNITS CAPS Take 1 capsule (50,000 Units total) by mouth every 7 (seven) days.  4 capsule  3  . zoster vaccine live, PF, (ZOSTAVAX) 84696 UNT/0.65ML injection Inject 19,400 Units into the skin once.  1 vial  0   No facility-administered medications prior to visit.    PE: Blood pressure 126/77, pulse 104, temperature  98.6 F (37 C), temperature source Temporal, resp. rate 16, height  5' 2"  (1.575 m), weight 162 lb (73.483 kg), SpO2 93.00%. Gen: Alert, well appearing.  Patient is oriented to person, place, time, and situation.  She appears her usual pale color. AFFECT: pleasant, lucid thought and speech. CV: RRR, no m/r/g.   LUNGS: CTA bilat, nonlabored resps, good aeration in all lung fields. EXT: no clubbing, cyanosis, or edema.  Foot exam - both normal; no swelling, tenderness or skin or vascular lesions. Color and temperature is normal. Sensation is intact. Peripheral pulses are palpable. Toenails are normal.  IMPRESSION AND PLAN:  1) DM 2, control decent.  Continue metformin 500 mg bid, continue to work on diet and exercise.   Diabetic foot exam today normal. She knows she is overdue for diab retpthy eye exam and will try to get this ASAP. We'll recheck her HbA1c at next f/u in 3 mo.  2) tobacco dependence---successfully quit on chantix.  Finish the last of the chantix.  3) Bipolar d/o + anxiety d/o: stable.  Continue all current meds.  4) Chronic pain: she is set up for c-spine surgery on 09/21/12 and per her report will get some pre-op labs and she will request that results be sent to me as well. Hopefully this surgery will help her with pain.  FOLLOW UP: 3 mo

## 2012-08-28 ENCOUNTER — Telehealth: Payer: Self-pay | Admitting: Family Medicine

## 2012-08-28 MED ORDER — CLONAZEPAM 2 MG PO TABS: ORAL_TABLET | ORAL | Status: DC

## 2012-08-28 NOTE — Telephone Encounter (Signed)
Rx printed

## 2012-08-28 NOTE — Telephone Encounter (Signed)
Patient forgot to ask for this refill when she was here for OV. Is going out of town Sunday. Can she get Rx before she leaves?

## 2012-08-31 NOTE — Telephone Encounter (Signed)
Faxed to pharmacy

## 2012-09-16 DIAGNOSIS — Z01810 Encounter for preprocedural cardiovascular examination: Secondary | ICD-10-CM

## 2012-09-21 ENCOUNTER — Ambulatory Visit: Payer: Medicare PPO | Admitting: Family Medicine

## 2012-11-06 ENCOUNTER — Telehealth: Payer: Self-pay | Admitting: Family Medicine

## 2012-11-06 MED ORDER — GABAPENTIN 300 MG PO CAPS: ORAL_CAPSULE | ORAL | Status: DC

## 2012-11-06 NOTE — Telephone Encounter (Signed)
Rx RF done.

## 2012-11-06 NOTE — Telephone Encounter (Signed)
I called pharmacy to verify patient didn't have any refills.  Their records don't match our records as far as refills go.  They said her last refill was on 08/15/12 and she had no refills.  Please advise refills.

## 2012-11-11 DIAGNOSIS — N200 Calculus of kidney: Secondary | ICD-10-CM

## 2012-11-11 HISTORY — DX: Calculus of kidney: N20.0

## 2012-11-16 ENCOUNTER — Other Ambulatory Visit: Payer: Self-pay | Admitting: Family Medicine

## 2012-11-16 MED ORDER — VITAMIN D3 50 MCG (2000 UT) PO CAPS: 2000.0000 [IU] | ORAL_CAPSULE | Freq: Every day | ORAL | Status: DC

## 2012-11-19 ENCOUNTER — Ambulatory Visit (HOSPITAL_BASED_OUTPATIENT_CLINIC_OR_DEPARTMENT_OTHER)
Admission: RE | Admit: 2012-11-19 | Discharge: 2012-11-19 | Disposition: A | Discharge status: Home / Self Care | Payer: Medicare PPO | Source: Ambulatory Visit | Attending: Family Medicine | Admitting: Family Medicine

## 2012-11-19 ENCOUNTER — Encounter: Payer: Self-pay | Admitting: Family Medicine

## 2012-11-19 ENCOUNTER — Ambulatory Visit (INDEPENDENT_AMBULATORY_CARE_PROVIDER_SITE_OTHER): Payer: Medicare PPO | Admitting: Family Medicine

## 2012-11-19 ENCOUNTER — Ambulatory Visit (INDEPENDENT_AMBULATORY_CARE_PROVIDER_SITE_OTHER)
Admission: RE | Admit: 2012-11-19 | Discharge: 2012-11-19 | Disposition: A | Discharge status: Home / Self Care | Payer: Medicare PPO | Source: Ambulatory Visit | Attending: Family Medicine | Admitting: Family Medicine

## 2012-11-19 VITALS — BP 120/83 | HR 105 | Temp 98.9°F | Resp 18 | Ht 62.0 in | Wt 159.0 lb

## 2012-11-19 DIAGNOSIS — F411 Generalized anxiety disorder: Secondary | ICD-10-CM

## 2012-11-19 DIAGNOSIS — R1012 Left upper quadrant pain: Secondary | ICD-10-CM

## 2012-11-19 DIAGNOSIS — M546 Pain in thoracic spine: Secondary | ICD-10-CM

## 2012-11-19 DIAGNOSIS — Z9884 Bariatric surgery status: Secondary | ICD-10-CM | POA: Insufficient documentation

## 2012-11-19 DIAGNOSIS — R3915 Urgency of urination: Secondary | ICD-10-CM

## 2012-11-19 DIAGNOSIS — R109 Unspecified abdominal pain: Secondary | ICD-10-CM

## 2012-11-19 DIAGNOSIS — M549 Dorsalgia, unspecified: Secondary | ICD-10-CM

## 2012-11-19 DIAGNOSIS — N201 Calculus of ureter: Secondary | ICD-10-CM | POA: Insufficient documentation

## 2012-11-19 DIAGNOSIS — N1330 Unspecified hydronephrosis: Secondary | ICD-10-CM | POA: Insufficient documentation

## 2012-11-19 DIAGNOSIS — K7689 Other specified diseases of liver: Secondary | ICD-10-CM | POA: Insufficient documentation

## 2012-11-19 DIAGNOSIS — Z23 Encounter for immunization: Secondary | ICD-10-CM

## 2012-11-19 DIAGNOSIS — R918 Other nonspecific abnormal finding of lung field: Secondary | ICD-10-CM

## 2012-11-19 LAB — POCT URINALYSIS DIPSTICK
Bilirubin, UA: NEGATIVE
Blood, UA: NEGATIVE
Ketones, UA: NEGATIVE
pH, UA: 6.5

## 2012-11-19 LAB — CBC WITH DIFFERENTIAL/PLATELET
Eosinophils Relative: 2.2 % (ref 0.0–5.0)
HCT: 39.2 % (ref 36.0–46.0)
Hemoglobin: 13 g/dL (ref 12.0–15.0)
Lymphs Abs: 3.4 10*3/uL (ref 0.7–4.0)
Monocytes Relative: 5.9 % (ref 3.0–12.0)
Platelets: 292 10*3/uL (ref 150.0–400.0)
WBC: 9.4 10*3/uL (ref 4.5–10.5)

## 2012-11-19 LAB — COMPREHENSIVE METABOLIC PANEL
Albumin: 3.7 g/dL (ref 3.5–5.2)
CO2: 32 mEq/L (ref 19–32)
Calcium: 9.3 mg/dL (ref 8.4–10.5)
Chloride: 97 mEq/L (ref 96–112)
GFR: 40.36 mL/min — ABNORMAL LOW (ref >60.00)
Glucose, Bld: 132 mg/dL — ABNORMAL HIGH (ref 70–99)
Sodium: 138 mEq/L (ref 135–145)
Total Bilirubin: 0.6 mg/dL (ref 0.3–1.2)
Total Protein: 7.6 g/dL (ref 6.0–8.3)

## 2012-11-19 LAB — LIPASE: Lipase: 8 U/L — ABNORMAL LOW (ref 11.0–59.0)

## 2012-11-19 NOTE — Progress Notes (Addendum)
OFFICE NOTE  11/19/2012  CC:  Chief Complaint  Patient presents with  . Back Pain    since yesterday  . Abdominal Pain     HPI: Patient is a 57 y.o. Caucasian female who is here for severe pain in left mid/lower back extending/radiating around left side to left upper abdomen and left groin region.  Worse with movement.  She cannot find a comfortable position. Onset was 2-3 days ago, no gross hematuria, no fever.  +Nausea but no vomiting.  +Urinary urgency but then she doesn't have much urine come out.  No urinary frequency or dysuria.  BM's relatively infrequent but no significant change from baseline.  No hematochezia or melena.  No vag bleeding or d/c. Tried heating pad, also using one 48m oxycodone every 4-6 hours and a zanaflex 2-3 times per day.  She has been on this pain med since having neck problems PLUS has stayed on it since getting neck surgery 09/2012--fusion was done (Dr. RCarloyn Manneris her surgeon and rx's her pain meds and muscle relaxers.).  She also says that she and her husband have been separated recently and she is depressed.  She says he is critical of her pain medication use.  She says he drinks too much.  He did drive her to/from today's appt.  Pertinent PMH:  Past Medical History  Diagnosis Date  . Diabetes mellitus     urine protein-Cr ratio nl 09/2010, D.R. Screen neg 10/2009  . Obesity     s/p bariatric surgery  . Hepatic steatosis 2005    ultrasound  . Bipolar disorder     questionable  . Restless legs syndrome   . Insomnia   . Neurosis, anxiety, panic type   . Chronic fatigue   . Iron deficiency anemia 09/2009    malabsorbtion s/p bariatric surgery  . Vitamin D deficiency 09/2009  . Other B-complex deficiencies   . Nephrolithiasis   . Osteopenia 12/2010    Hip T score -2.0.  Spine T score -0.5 (plan to repeat in 2 yrs)  . Colon cancer screening 03/06/2011    iFob 11/2010 through her insurer was negative.   . Tobacco dependence   . Chronic nausea 2012/13     Normal upper GI  12/2011 (mild GERD noted +small hiatal hernia)+  . Chronic neck pain     cervical spondylosis on 2013 MRI   Past Surgical History  Procedure Laterality Date  . Roux-en-y gastric bypass    . Tonsillectomy    . Abdominal hysterectomy      fibroids, ovaries remain  . Bladder surgery      bladder tack  . Appendectomy    . Hernia repair  2005    mesenteric hernia repair, with lysis of adhesions (presented with SBO)  . Cholecystectomy  04/2010    w/lysis of adhesions (open procedure)--path showed chronic cholecystitis and cholesterol polyp.  .Marland KitchenEpidural block injection  03/2011    Cervical  . Cervical spine surgery  09/2009     MEDS:  Outpatient Prescriptions Prior to Visit  Medication Sig Dispense Refill  . albuterol (PROVENTIL HFA;VENTOLIN HFA) 108 (90 BASE) MCG/ACT inhaler Inhale 2 puffs into the lungs every 4 (four) hours as needed for wheezing (cough, shortness of breath or wheezing.).  1 Inhaler  0  . Alum & Mag Hydroxide-Simeth (MAGIC MOUTHWASH W/LIDOCAINE) SOLN 1 tsp swish and spit qAC and qhs prn  120 mL  2  . buPROPion (WELLBUTRIN XL) 150 MG 24 hr tablet TAKE 1  TABLET (150 MG TOTAL) BY MOUTH DAILY.  30 tablet  6  . calcium elemental as carbonate (TUMS ULTRA 1000) 400 MG tablet Chew 1,000 mg by mouth daily.        . Cholecalciferol (VITAMIN D3) 2000 UNITS capsule Take 1 capsule (2,000 Units total) by mouth daily.  30 capsule  2  . clonazePAM (KLONOPIN) 2 MG tablet TALE 1 TABLET BY MOUTH THREE TIMES A DAY AS NEEDED FOR ANXIETY  90 tablet  5  . cyanocobalamin (,VITAMIN B-12,) 1000 MCG/ML injection Inject 1 mL (1,000 mcg total) into the muscle every 14 (fourteen) days.  10 mL  3  . gabapentin (NEURONTIN) 300 MG capsule Take 1-4 tabs po qhs  120 capsule  5  . metFORMIN (GLUCOPHAGE) 500 MG tablet Take 1 tablet (500 mg total) by mouth 2 (two) times daily with a meal.  180 tablet  3  . mirtazapine (REMERON) 45 MG tablet Take 1 tablet (45 mg total) by mouth at bedtime.  30  tablet  6  . Multiple Vitamin (MULTIVITAMIN) tablet Take 1 tablet by mouth daily.        Marland Kitchen oxycodone (OXYCONTIN) 30 MG TB12 Take 30 mg by mouth 3 (three) times daily.      . pramipexole (MIRAPEX) 0.125 MG tablet TAKE 4-5 TABLETS BY MOUTH AT BEDTIME  120 tablet  6  . tiZANidine (ZANAFLEX) 4 MG tablet Take 1 tablet by mouth Three times a day.      . Vitamin D, Ergocalciferol, (DRISDOL) 50000 UNITS CAPS Take 1 capsule (50,000 Units total) by mouth every 7 (seven) days.  4 capsule  3  . ARIPiprazole (ABILIFY) 10 MG tablet Take 1 tablet (10 mg total) by mouth daily.  30 tablet  1  . aspirin 325 MG EC tablet Take 325 mg by mouth daily.      . clindamycin (CLEOCIN) 300 MG capsule 2 caps po 1 hr prior to dental procedure  2 capsule  0  . ferrous sulfate 325 (65 FE) MG tablet Take 325 mg by mouth daily.        . indomethacin (INDOCIN) 50 MG capsule 1 cap tid with food as needed for gout flares  15 capsule  3  . mupirocin ointment (BACTROBAN) 2 % Apply topically 3 (three) times daily.  15 g  1  . NOVOFINE 32G X 6 MM MISC USE AS DIRECTED.  100 each  2  . ondansetron (ZOFRAN-ODT) 8 MG disintegrating tablet Take 1 tablet (8 mg total) by mouth every 8 (eight) hours as needed for nausea.  30 tablet  2  . ONE TOUCH ULTRA TEST test strip CHECK BLOOD SUGAR TWO TIMES DAILY AS DIRECTED.  100 each  5  . ONETOUCH DELICA LANCETS MISC Check glucose bid  60 each  6  . oxyCODONE (ROXICODONE) 15 MG immediate release tablet Take 1 tablet (15 mg total) by mouth every 6 (six) hours as needed for pain.  120 tablet  0  . pantoprazole (PROTONIX) 40 MG tablet Take 1 tablet (40 mg total) by mouth daily.  30 tablet  3  . promethazine (PHENERGAN) 12.5 MG tablet Take 1-2 tablet every 6 hours as needed for nausea  60 tablet  2  . SYRINGE-NEEDLE, DISP, 3 ML (B-D 3CC LUER-LOK SYR 23GX1") 23G X 1" 3 ML MISC Use as directed to inject B12  10 each  1  . varenicline (CHANTIX CONTINUING MONTH PAK) 1 MG tablet Take 1 tablet (1 mg total) by  mouth 2 (  two) times daily.  60 tablet  2  . varenicline (CHANTIX STARTING MONTH PAK) 0.5 MG X 11 & 1 MG X 42 tablet Take one 0.5 mg tablet by mouth once daily for 3 days, then increase to one 0.5 mg tablet twice daily for 4 days, then increase to one 1 mg tablet twice daily.  53 tablet  0  . zoster vaccine live, PF, (ZOSTAVAX) 14431 UNT/0.65ML injection Inject 19,400 Units into the skin once.  1 vial  0   No facility-administered medications prior to visit.    PE: Blood pressure 120/83, pulse 105, temperature 98.9 F (37.2 C), temperature source Temporal, resp. rate 18, height 5' 2"  (1.575 m), weight 159 lb (72.122 kg), SpO2 94.00%. Gen: alert, able to attend/focus, but appears very tired and she speaks slowly and slurs some of her words.  She appears pale. Lips are pink, oral mucosa pink/moist.  No erythema, swelling, or exudate in pharynx.  No focal oropharyngeal lesion. Neck - No masses or thyromegaly or limitation in range of motion CV: Regular, rate about 90 by me, no ectopy.  No m/r/g. LUNGS: CTA except for left basilar soft insp crackles.  Also with increased tactile fremitus at left base.  Left axillary region with bronchial breath sounds.  No wheezing or prolongation of exp phase.  No tachypnea.  Nonlabored resps, with good aeration. ABD: soft, tender everywhere except LLQ.  She has voluntary guarding.  No rebound tenderness.  No mass, no HSM, no bruit.  BS are hypoactive. Back: nontender--she actually said hand pressure in left mid-back region made her back feel better.  Question of mild discomfort with palpation of left side/flank. EXT: no clubbing, cyanosis, or edema.  SKIN: no rash  LAB: CC UA today showed trace LEU, otherwise normal. PA/LAT CXR here today appears normal: awaiting over-read from radiologist.  IMPRESSION AND PLAN:  Acute illness characterized by left mid back, left side, and left sided flank and upper abd pain. DDX: nephrolithiasis (but no blood on UA today is  odd), pancreatitis, left LL infiltrate irritating the diaphram (this was supported by her lung exam today but CXR was unremarkable--perhaps these lung sounds are atelectasis from pt splinting due to left sided pain), diverticulitis (less likely given pt has no LLQ tenderness at all), UTI with left pyelo (but UA today only with trace abnormality--does not clinically correlate with the severity of her symptoms), musculoskeletal pain less likely, severe constipation (narcotic induced) less likely.  Will do CBC w/diff, CMET, and lipase.  Have also arranged for noncontrast CT abd/pelv today at [facility]. No new meds rx'd at this time: will await results of labs and CT.  ANXIETY STATE, UNSPECIFIED GAD/agoraphobia/hx of panic: I rx her clonazepam and also her non-controlled meds for this. Controlled substance contract reviewed with patient today.  Patient signed this and it will be placed in the chart.   No new rx's given today.   Flu vaccine given IM today.  FOLLOW UP:  To be determined based on pending w/u.  ADDENDUM 11/20/12 (early evening):  Noncontrast CT abd/pelv today-- IMPRESSION:  1. Obstructive left UVJ calculus measuring 0.9 x 0.5 cm is  associated with moderate left hydronephrosis and moderate left  hydroureter. No additional renal calculi.  2. Prominent diffuse hepatic steatosis.  3. Chronic upper normal sized retroperitoneal and peripancreatic  lymph nodes.  Electronically Signed  By: Sherryl Barters M.D.  On: 11/19/2012 16:30          Result Notes  Notes Recorded by Tammi Sou, MD on 11/19/2012 at 7:58 PM Pt notified. Still in pain.  She has been in pain for 48+ hours. Will give this until 10 AM tomorrow to pass into bladder (Cr today 1.4, otherwise labs okay). If not passed by then will consult urology.    ADDENDUM 11/21/12:  Pt called to report she still has the same pain. Will ask if [facility] can see her urgently today.--PM

## 2012-11-19 NOTE — Assessment & Plan Note (Signed)
GAD/agoraphobia/hx of panic: I rx her clonazepam and also her non-controlled meds for this. Controlled substance contract reviewed with patient today.  Patient signed this and it will be placed in the chart.   No new rx's given today.

## 2012-11-20 ENCOUNTER — Observation Stay (HOSPITAL_COMMUNITY)
Admission: EM | Admit: 2012-11-20 | Discharge: 2012-11-21 | Disposition: A | Discharge status: Home / Self Care | Payer: Medicare PPO | Attending: Urology | Admitting: Urology

## 2012-11-20 ENCOUNTER — Encounter (HOSPITAL_COMMUNITY): Payer: Self-pay | Admitting: Emergency Medicine

## 2012-11-20 ENCOUNTER — Other Ambulatory Visit: Payer: Self-pay | Admitting: Urology

## 2012-11-20 DIAGNOSIS — N201 Calculus of ureter: Principal | ICD-10-CM | POA: Insufficient documentation

## 2012-11-20 DIAGNOSIS — F329 Major depressive disorder, single episode, unspecified: Secondary | ICD-10-CM

## 2012-11-20 DIAGNOSIS — E1165 Type 2 diabetes mellitus with hyperglycemia: Secondary | ICD-10-CM | POA: Diagnosis present

## 2012-11-20 DIAGNOSIS — F17200 Nicotine dependence, unspecified, uncomplicated: Secondary | ICD-10-CM

## 2012-11-20 DIAGNOSIS — R109 Unspecified abdominal pain: Secondary | ICD-10-CM

## 2012-11-20 DIAGNOSIS — Z87891 Personal history of nicotine dependence: Secondary | ICD-10-CM | POA: Insufficient documentation

## 2012-11-20 DIAGNOSIS — E559 Vitamin D deficiency, unspecified: Secondary | ICD-10-CM

## 2012-11-20 DIAGNOSIS — F3189 Other bipolar disorder: Secondary | ICD-10-CM

## 2012-11-20 DIAGNOSIS — K5900 Constipation, unspecified: Secondary | ICD-10-CM | POA: Insufficient documentation

## 2012-11-20 DIAGNOSIS — E538 Deficiency of other specified B group vitamins: Secondary | ICD-10-CM

## 2012-11-20 DIAGNOSIS — Z9884 Bariatric surgery status: Secondary | ICD-10-CM | POA: Insufficient documentation

## 2012-11-20 DIAGNOSIS — E119 Type 2 diabetes mellitus without complications: Secondary | ICD-10-CM

## 2012-11-20 DIAGNOSIS — M79672 Pain in left foot: Secondary | ICD-10-CM

## 2012-11-20 DIAGNOSIS — K5909 Other constipation: Secondary | ICD-10-CM

## 2012-11-20 DIAGNOSIS — N200 Calculus of kidney: Secondary | ICD-10-CM | POA: Diagnosis present

## 2012-11-20 DIAGNOSIS — D509 Iron deficiency anemia, unspecified: Secondary | ICD-10-CM

## 2012-11-20 DIAGNOSIS — E785 Hyperlipidemia, unspecified: Secondary | ICD-10-CM

## 2012-11-20 DIAGNOSIS — Z981 Arthrodesis status: Secondary | ICD-10-CM | POA: Insufficient documentation

## 2012-11-20 DIAGNOSIS — N1330 Unspecified hydronephrosis: Secondary | ICD-10-CM

## 2012-11-20 DIAGNOSIS — R110 Nausea: Secondary | ICD-10-CM

## 2012-11-20 DIAGNOSIS — G2581 Restless legs syndrome: Secondary | ICD-10-CM | POA: Diagnosis present

## 2012-11-20 DIAGNOSIS — R1310 Dysphagia, unspecified: Secondary | ICD-10-CM

## 2012-11-20 DIAGNOSIS — R5382 Chronic fatigue, unspecified: Secondary | ICD-10-CM

## 2012-11-20 DIAGNOSIS — F411 Generalized anxiety disorder: Secondary | ICD-10-CM

## 2012-11-20 DIAGNOSIS — M542 Cervicalgia: Secondary | ICD-10-CM

## 2012-11-20 DIAGNOSIS — F3181 Bipolar II disorder: Secondary | ICD-10-CM | POA: Diagnosis present

## 2012-11-20 DIAGNOSIS — K909 Intestinal malabsorption, unspecified: Secondary | ICD-10-CM

## 2012-11-20 DIAGNOSIS — F4321 Adjustment disorder with depressed mood: Secondary | ICD-10-CM

## 2012-11-20 DIAGNOSIS — F3289 Other specified depressive episodes: Secondary | ICD-10-CM

## 2012-11-20 DIAGNOSIS — E539 Vitamin B deficiency, unspecified: Secondary | ICD-10-CM | POA: Insufficient documentation

## 2012-11-20 DIAGNOSIS — F4320 Adjustment disorder, unspecified: Secondary | ICD-10-CM

## 2012-11-20 DIAGNOSIS — IMO0002 Reserved for concepts with insufficient information to code with codable children: Secondary | ICD-10-CM | POA: Diagnosis present

## 2012-11-20 LAB — COMPREHENSIVE METABOLIC PANEL
ALT: 23 U/L (ref 0–35)
AST: 62 U/L — ABNORMAL HIGH (ref 0–37)
Albumin: 3.2 g/dL — ABNORMAL LOW (ref 3.5–5.2)
CO2: 27 mEq/L (ref 19–32)
Chloride: 99 mEq/L (ref 96–112)
GFR calc non Af Amer: 44 mL/min — ABNORMAL LOW (ref >90)
Potassium: 3.9 mEq/L (ref 3.5–5.1)
Sodium: 136 mEq/L (ref 135–145)
Total Bilirubin: 0.5 mg/dL (ref 0.3–1.2)

## 2012-11-20 LAB — URINE MICROSCOPIC-ADD ON

## 2012-11-20 LAB — CBC WITH DIFFERENTIAL/PLATELET
Basophils Absolute: 0 10*3/uL (ref 0.0–0.1)
Basophils Relative: 0 % (ref 0–1)
HCT: 37.8 % (ref 36.0–46.0)
Lymphocytes Relative: 37 % (ref 12–46)
Neutro Abs: 3.8 10*3/uL (ref 1.7–7.7)
Neutrophils Relative %: 54 % (ref 43–77)
Platelets: 242 10*3/uL (ref 150–400)
RDW: 13.2 % (ref 11.5–15.5)
WBC: 7 10*3/uL (ref 4.0–10.5)

## 2012-11-20 LAB — URINALYSIS, ROUTINE W REFLEX MICROSCOPIC
Glucose, UA: NEGATIVE mg/dL
Protein, ur: NEGATIVE mg/dL
Specific Gravity, Urine: 1.017 (ref 1.005–1.030)
Urobilinogen, UA: 1 mg/dL (ref 0.0–1.0)

## 2012-11-20 LAB — GLUCOSE, CAPILLARY: Glucose-Capillary: 263 mg/dL — ABNORMAL HIGH (ref 70–99)

## 2012-11-20 MED ORDER — ONDANSETRON HCL 4 MG/2ML IJ SOLN
4.0000 mg | Freq: Once | INTRAMUSCULAR | Status: AC
  Administered 2012-11-20: 4 mg via INTRAVENOUS
  Filled 2012-11-20: qty 2

## 2012-11-20 MED ORDER — CIPROFLOXACIN IN D5W 400 MG/200ML IV SOLN
400.0000 mg | INTRAVENOUS | Status: AC
  Administered 2012-11-21: 400 mg via INTRAVENOUS
  Filled 2012-11-20: qty 200

## 2012-11-20 MED ORDER — HYDROMORPHONE HCL PF 1 MG/ML IJ SOLN
1.0000 mg | Freq: Once | INTRAMUSCULAR | Status: AC
  Administered 2012-11-20: 1 mg via INTRAVENOUS
  Filled 2012-11-20: qty 1

## 2012-11-20 MED ORDER — ONDANSETRON HCL 4 MG/2ML IJ SOLN
4.0000 mg | Freq: Once | INTRAMUSCULAR | Status: AC
Start: 2012-11-20 — End: 2012-11-20
  Administered 2012-11-20: 4 mg via INTRAVENOUS
  Filled 2012-11-20: qty 2

## 2012-11-20 MED ORDER — KETOROLAC TROMETHAMINE 30 MG/ML IJ SOLN
30.0000 mg | Freq: Once | INTRAMUSCULAR | Status: AC
  Administered 2012-11-20: 30 mg via INTRAVENOUS
  Filled 2012-11-20: qty 1

## 2012-11-20 MED ORDER — ONDANSETRON HCL 4 MG/2ML IJ SOLN: 4.0000 mg | Freq: Four times a day (QID) | INTRAMUSCULAR | Status: DC | PRN

## 2012-11-20 MED ORDER — SODIUM CHLORIDE 0.9 % IV SOLN
INTRAVENOUS | Status: DC
  Administered 2012-11-20 – 2012-11-21 (×2): via INTRAVENOUS

## 2012-11-20 MED ORDER — HYDROMORPHONE HCL PF 1 MG/ML IJ SOLN
1.0000 mg | INTRAMUSCULAR | Status: DC | PRN
  Administered 2012-11-20 – 2012-11-21 (×5): 1 mg via INTRAVENOUS
  Filled 2012-11-20 (×5): qty 1

## 2012-11-20 MED ORDER — ONDANSETRON HCL 4 MG PO TABS: 4.0000 mg | ORAL_TABLET | Freq: Four times a day (QID) | ORAL | Status: DC | PRN

## 2012-11-20 MED ORDER — INSULIN ASPART 100 UNIT/ML ~~LOC~~ SOLN
0.0000 [IU] | Freq: Three times a day (TID) | SUBCUTANEOUS | Status: DC
  Administered 2012-11-21: 1 [IU] via SUBCUTANEOUS

## 2012-11-20 NOTE — ED Provider Notes (Signed)
CSN: [id number]     Arrival date & time 11/20/12  1725 History   First MD Initiated Contact with Patient 11/20/12 1727     Chief Complaint  Patient presents with  . Abdominal Pain   (Consider location/radiation/quality/duration/timing/severity/associated sxs/prior Treatment) HPI Comments: Patient here from Dr. Arlyn Leak office where she was seen by him for a left sided kidney stone.  Patient is to have lithotripsy tomorrow about noon, but she was sent here for pain control and admission by medicine because of her diabetes.  She reports continued left flank pain with radiation into her LLQ with associated nausea and vomiting.  She denies fever, chills, gross hematuria, rectal bleeding.  Patient is a 57 y.o. female presenting with abdominal pain. The history is provided by the patient, the spouse and medical records. No language interpreter was used.  Abdominal Pain Pain location:  L flank Pain quality: cramping, sharp and stabbing   Pain radiates to:  LLQ Pain severity:  Severe Onset quality:  Sudden Duration:  4 days Timing:  Constant Progression:  Worsening Chronicity:  New Context: not alcohol use, not diet changes, not eating, not previous surgeries, not recent travel, not retching and not trauma   Relieved by:  Nothing Worsened by:  Nothing tried Ineffective treatments:  None tried Associated symptoms: anorexia, nausea and vomiting   Associated symptoms: no chest pain, no cough, no diarrhea, no fever, no hematuria and no shortness of breath     Past Medical History  Diagnosis Date  . Diabetes mellitus     urine protein-Cr ratio nl 09/2010, D.R. Screen neg 10/2009  . Obesity     s/p bariatric surgery  . Hepatic steatosis 2005    ultrasound  . Bipolar disorder     questionable  . Restless legs syndrome   . Insomnia   . Neurosis, anxiety, panic type   . Chronic fatigue   . Iron deficiency anemia 09/2009    malabsorbtion s/p bariatric surgery  . Vitamin D deficiency  09/2009  . Other B-complex deficiencies   . Nephrolithiasis   . Osteopenia 12/2010    Hip T score -2.0.  Spine T score -0.5 (plan to repeat in 2 yrs)  . Colon cancer screening 03/06/2011    iFob 11/2010 through her insurer was negative.   . Tobacco dependence   . Chronic nausea 2012/13    Normal upper GI  12/2011 (mild GERD noted +small hiatal hernia)+  . Chronic neck pain     cervical spondylosis on 2013 MRI   Past Surgical History  Procedure Laterality Date  . Roux-en-y gastric bypass    . Tonsillectomy    . Abdominal hysterectomy      fibroids, ovaries remain  . Bladder surgery      bladder tack  . Appendectomy    . Hernia repair  2005    mesenteric hernia repair, with lysis of adhesions (presented with SBO)  . Cholecystectomy  04/2010    w/lysis of adhesions (open procedure)--path showed chronic cholecystitis and cholesterol polyp.  Marland Kitchen Epidural block injection  03/2011    Cervical  . Cervical spine surgery  09/2009   No family history on file. History  Substance Use Topics  . Smoking status: Former Smoker -- 1.00 packs/day    Types: Cigarettes    Quit date: 08/11/2012  . Smokeless tobacco: Never Used  . Alcohol Use: No   OB History   Grav Para Term Preterm Abortions TAB SAB Ect Mult Living  Review of Systems  Constitutional: Negative for fever.  Respiratory: Negative for cough and shortness of breath.   Cardiovascular: Negative for chest pain.  Gastrointestinal: Positive for nausea, vomiting, abdominal pain and anorexia. Negative for diarrhea.  Genitourinary: Negative for hematuria.  All other systems reviewed and are negative.    Allergies  Codeine; Latex; and Penicillins  Home Medications   Current Outpatient Rx  Name  Route  Sig  Dispense  Refill  . albuterol (PROVENTIL HFA;VENTOLIN HFA) 108 (90 BASE) MCG/ACT inhaler   Inhalation   Inhale 2 puffs into the lungs every 4 (four) hours as needed for wheezing (cough, shortness of breath or  wheezing.).   1 Inhaler   0   . Alum & Mag Hydroxide-Simeth (MAGIC MOUTHWASH W/LIDOCAINE) SOLN      1 tsp swish and spit qAC and qhs prn   120 mL   2   . ARIPiprazole (ABILIFY) 10 MG tablet   Oral   Take 1 tablet (10 mg total) by mouth daily.   30 tablet   1   . aspirin 325 MG EC tablet   Oral   Take 325 mg by mouth daily.         Marland Kitchen buPROPion (WELLBUTRIN XL) 150 MG 24 hr tablet      TAKE 1 TABLET (150 MG TOTAL) BY MOUTH DAILY.   30 tablet   6   . calcium elemental as carbonate (TUMS ULTRA 1000) 400 MG tablet   Oral   Chew 1,000 mg by mouth daily.           . Cholecalciferol (VITAMIN D3) 2000 UNITS capsule   Oral   Take 1 capsule (2,000 Units total) by mouth daily.   30 capsule   2   . clindamycin (CLEOCIN) 300 MG capsule      2 caps po 1 hr prior to dental procedure   2 capsule   0   . clonazePAM (KLONOPIN) 2 MG tablet      TALE 1 TABLET BY MOUTH THREE TIMES A DAY AS NEEDED FOR ANXIETY   90 tablet   5   . cyanocobalamin (,VITAMIN B-12,) 1000 MCG/ML injection   Intramuscular   Inject 1 mL (1,000 mcg total) into the muscle every 14 (fourteen) days.   10 mL   3   . ferrous sulfate 325 (65 FE) MG tablet   Oral   Take 325 mg by mouth daily.           Marland Kitchen gabapentin (NEURONTIN) 300 MG capsule      Take 1-4 tabs po qhs   120 capsule   5   . indomethacin (INDOCIN) 50 MG capsule      1 cap tid with food as needed for gout flares   15 capsule   3   . metFORMIN (GLUCOPHAGE) 500 MG tablet   Oral   Take 1 tablet (500 mg total) by mouth 2 (two) times daily with a meal.   180 tablet   3   . mirtazapine (REMERON) 45 MG tablet   Oral   Take 1 tablet (45 mg total) by mouth at bedtime.   30 tablet   6   . Multiple Vitamin (MULTIVITAMIN) tablet   Oral   Take 1 tablet by mouth daily.           . mupirocin ointment (BACTROBAN) 2 %   Topical   Apply topically 3 (three) times daily.   15 g   1   .  NOVOFINE 32G X 6 MM MISC      USE AS  DIRECTED.   100 each   2   . ondansetron (ZOFRAN-ODT) 8 MG disintegrating tablet   Oral   Take 1 tablet (8 mg total) by mouth every 8 (eight) hours as needed for nausea.   30 tablet   2   . ONE TOUCH ULTRA TEST test strip      CHECK BLOOD SUGAR TWO TIMES DAILY AS DIRECTED.   100 each   5   . ONETOUCH DELICA LANCETS MISC      Check glucose bid   60 each   6   . oxycodone (OXYCONTIN) 30 MG TB12   Oral   Take 30 mg by mouth 3 (three) times daily.         Marland Kitchen oxyCODONE (ROXICODONE) 15 MG immediate release tablet   Oral   Take 1 tablet (15 mg total) by mouth every 6 (six) hours as needed for pain.   120 tablet   0   . pantoprazole (PROTONIX) 40 MG tablet   Oral   Take 1 tablet (40 mg total) by mouth daily.   30 tablet   3   . pramipexole (MIRAPEX) 0.125 MG tablet      TAKE 4-5 TABLETS BY MOUTH AT BEDTIME   120 tablet   6   . promethazine (PHENERGAN) 12.5 MG tablet      Take 1-2 tablet every 6 hours as needed for nausea   60 tablet   2   . SYRINGE-NEEDLE, DISP, 3 ML (B-D 3CC LUER-LOK SYR 23GX1") 23G X 1" 3 ML MISC      Use as directed to inject B12   10 each   1   . tiZANidine (ZANAFLEX) 4 MG tablet   Oral   Take 1 tablet by mouth Three times a day.         . varenicline (CHANTIX CONTINUING MONTH PAK) 1 MG tablet   Oral   Take 1 tablet (1 mg total) by mouth 2 (two) times daily.   60 tablet   2   . varenicline (CHANTIX STARTING MONTH PAK) 0.5 MG X 11 & 1 MG X 42 tablet      Take one 0.5 mg tablet by mouth once daily for 3 days, then increase to one 0.5 mg tablet twice daily for 4 days, then increase to one 1 mg tablet twice daily.   53 tablet   0   . Vitamin D, Ergocalciferol, (DRISDOL) 50000 UNITS CAPS   Oral   Take 1 capsule (50,000 Units total) by mouth every 7 (seven) days.   4 capsule   3   . zoster vaccine live, PF, (ZOSTAVAX) 33545 UNT/0.65ML injection   Subcutaneous   Inject 19,400 Units into the skin once.   1 vial   0    BP  107/61  Pulse 108  Temp(Src) 98.9 F (37.2 C) (Oral)  SpO2 92% Physical Exam  Nursing note and vitals reviewed. Constitutional: She is oriented to person, place, and time. She appears well-developed and well-nourished. She appears distressed.  Uncomfortable appearing  HENT:  Head: Normocephalic and atraumatic.  Right Ear: External ear normal.  Left Ear: External ear normal.  Mouth/Throat: Oropharynx is clear and moist. No oropharyngeal exudate.  Eyes: Pupils are equal, round, and reactive to light. No scleral icterus.  Neck: Normal range of motion. Neck supple.  Cardiovascular: Normal rate, regular rhythm and normal heart sounds.  Exam reveals no gallop  and no friction rub.   No murmur heard. Pulmonary/Chest: Effort normal and breath sounds normal. No respiratory distress. She has no wheezes. She has no rales. She exhibits no tenderness.  Abdominal: Soft. Bowel sounds are normal. She exhibits no distension. There is no tenderness. There is CVA tenderness. There is no rebound and no guarding.  Left CVA tenderness  Musculoskeletal: Normal range of motion. She exhibits no edema and no tenderness.  Lymphadenopathy:    She has no cervical adenopathy.  Neurological: She is alert and oriented to person, place, and time. She exhibits normal muscle tone. Coordination normal.  Skin: Skin is warm and dry. No rash noted. No erythema. No pallor.  Psychiatric: She has a normal mood and affect. Her behavior is normal. Judgment and thought content normal.    ED Course  Procedures (including critical care time) Labs Review Labs Reviewed  CBC WITH DIFFERENTIAL  COMPREHENSIVE METABOLIC PANEL  URINALYSIS, ROUTINE W REFLEX MICROSCOPIC   Imaging Review Ct Abdomen Pelvis Wo Contrast  11/19/2012   CLINICAL DATA:  Left flank pain. Prior appendectomy and gastric bypass.  EXAM: CT ABDOMEN AND PELVIS WITHOUT CONTRAST  TECHNIQUE: Multidetector CT imaging of the abdomen and pelvis was performed following  the standard protocol without intravenous contrast.  COMPARISON:  05/10/2010  FINDINGS: Old granulomatous disease noted in the chest and spleen.  Prominent diffuse hepatic steatosis. Spleen and adrenal glands unremarkable. Gallbladder surgically absent.  Left hydronephrosis noted with hydroureter extending down to a 9 x 5 mm left UVJ calculus. No additional left-sided calculi noted. No right-sided renal or ureteral calculi. Bladder otherwise unremarkable.  Postoperative findings from prior gastric bypass. Borderline prominence of formed stool in the proximal half of the colon.  A left periaortic lymph node measures 0.9 cm in short axis, and additional upper normal sized retroperitoneal lymph nodes are observed without overt pathologic adenopathy. Borderline enlarged peripancreatic lymph nodes are similar to the prior exam. Uterus absent.  IMPRESSION: 1. Obstructive left UVJ calculus measuring 0.9 x 0.5 cm is associated with moderate left hydronephrosis and moderate left hydroureter. No additional renal calculi. 2. Prominent diffuse hepatic steatosis. 3. Chronic upper normal sized retroperitoneal and peripancreatic lymph nodes.   Electronically Signed   By: Sherryl Barters M.D.   On: 11/19/2012 16:30   Dg Chest 2 View  11/19/2012   CLINICAL DATA:  Left basilar crackles and bronchial breath sounds.  EXAM: CHEST  2 VIEW  COMPARISON:  Chest radiograph 09/16/2012  FINDINGS: Heart and mediastinal contours are normal. There is atherosclerotic calcification of the thoracic aortic arch. Pulmonary vascularity is normal.  There are new linear opacities in the left lower lobe. The right lung is clear.  Negative for pleural effusion or pneumothorax.  Surgical clips are noted in the abdomen bilaterally.  Cervical spine discussion cortical plate and screw changes in cervical spine noted. No acute osseous abnormality.  IMPRESSION: New linear opacities in the left lower lobe have imaging features most suggestive of atelectasis.  Early infection is difficult to exclude.   Electronically Signed   By: Curlene Dolphin M.D.   On: 11/19/2012 16:13    EKG Interpretation   None     8:23 PM Patent reports pain is returning.  I have spoken with Dr. Marisue Humble with [facility] who will see the patient, Dr. Gaynelle Arabian with urology will be coming in to see another patient and will see this patient as well.  MDM  Left obstructing UVJ stone  Dr. Gaynelle Arabian has seen the patient in the  office, she is scheduled for lithotripsy at noon tomorrow, pain better under control.  He requests that medicine admit the patient due to her diabetes.  I have spoken with Dr. Marisue Humble who will see the patient here in the ER.    Idalia Needle Joelyn Oms, [place] 11/20/12 2054

## 2012-11-20 NOTE — ED Notes (Signed)
Had ct scan done yesterday md [facility] called to have pt admitted for kidney stone and to have lipatripsy done tomorrow, n/v

## 2012-11-20 NOTE — H&P (Signed)
PCP:   Tammi Sou, MD   Chief Complaint:  Left flank pain  HPI: 57 yo female with several days of left flank pain h/o multiple renal stones in past has outpt ct scan showing an obstructive stone on the left, saw urology as outpt today who sent to ED for admission for procedure tomorrow at noon.  Pt has had some n/v nonbloody.  No fevers.  Eating a meal of fried foods right now without difficulty.  No dysuria or hematuria.  O/w no active issues except uncontrolled pain.  Did have recent cervical spine fusion anterior approach with well healing scar on neck.  Review of Systems:  Positive and negative as per HPI otherwise all other systems are negative  Past Medical History: Past Medical History  Diagnosis Date  . Diabetes mellitus     urine protein-Cr ratio nl 09/2010, D.R. Screen neg 10/2009  . Obesity     s/p bariatric surgery  . Hepatic steatosis 2005    ultrasound  . Bipolar disorder     questionable  . Restless legs syndrome   . Insomnia   . Neurosis, anxiety, panic type   . Chronic fatigue   . Iron deficiency anemia 09/2009    malabsorbtion s/p bariatric surgery  . Vitamin D deficiency 09/2009  . Other B-complex deficiencies   . Nephrolithiasis   . Osteopenia 12/2010    Hip T score -2.0.  Spine T score -0.5 (plan to repeat in 2 yrs)  . Colon cancer screening 03/06/2011    iFob 11/2010 through her insurer was negative.   . Tobacco dependence   . Chronic nausea 2012/13    Normal upper GI  12/2011 (mild GERD noted +small hiatal hernia)+  . Chronic neck pain     cervical spondylosis on 2013 MRI   Past Surgical History  Procedure Laterality Date  . Roux-en-y gastric bypass    . Tonsillectomy    . Abdominal hysterectomy      fibroids, ovaries remain  . Bladder surgery      bladder tack  . Appendectomy    . Hernia repair  2005    mesenteric hernia repair, with lysis of adhesions (presented with SBO)  . Cholecystectomy  04/2010    w/lysis of adhesions (open  procedure)--path showed chronic cholecystitis and cholesterol polyp.  Marland Kitchen Epidural block injection  03/2011    Cervical  . Cervical spine surgery  09/2009    Medications: Prior to Admission medications   Medication Sig Start Date End Date Taking? Authorizing Provider  Alum & Mag Hydroxide-Simeth (MAGIC MOUTHWASH W/LIDOCAINE) SOLN 1 tsp swish and spit qAC and qhs prn 05/22/12  Yes Tammi Sou, MD  buPROPion (WELLBUTRIN XL) 150 MG 24 hr tablet TAKE 1 TABLET (150 MG TOTAL) BY MOUTH DAILY. 07/28/12  Yes Tammi Sou, MD  calcium elemental as carbonate (TUMS ULTRA 1000) 400 MG tablet Chew 1,000 mg by mouth daily.     Yes Historical Provider, MD  Cholecalciferol (VITAMIN D3) 2000 UNITS capsule Take 1 capsule (2,000 Units total) by mouth daily. 11/16/12  Yes Tammi Sou, MD  clonazePAM (KLONOPIN) 2 MG tablet TALE 1 TABLET BY MOUTH THREE TIMES A DAY AS NEEDED FOR ANXIETY 08/28/12  Yes Tammi Sou, MD  cyanocobalamin (,VITAMIN B-12,) 1000 MCG/ML injection Inject 1 mL (1,000 mcg total) into the muscle every 14 (fourteen) days. 05/30/12  Yes Tammi Sou, MD  gabapentin (NEURONTIN) 300 MG capsule Take 900-1,200 mg by mouth daily. 900 mg at bedtime  can take up to 1200 mg at bedtime   Yes Historical Provider, MD  metFORMIN (GLUCOPHAGE) 500 MG tablet Take 1 tablet (500 mg total) by mouth 2 (two) times daily with a meal. 05/24/12  Yes Tammi Sou, MD  mirtazapine (REMERON) 45 MG tablet Take 1 tablet (45 mg total) by mouth at bedtime. 07/28/12  Yes Tammi Sou, MD  ondansetron (ZOFRAN-ODT) 8 MG disintegrating tablet Take 1 tablet (8 mg total) by mouth every 8 (eight) hours as needed for nausea. 11/28/11  Yes Tammi Sou, MD  oxycodone (OXYCONTIN) 30 MG TB12 Take 30 mg by mouth 3 (three) times daily. 03/05/11  Yes Tammi Sou, MD  pramipexole (MIRAPEX) 0.125 MG tablet TAKE 4-5 TABLETS BY MOUTH AT BEDTIME 07/28/12  Yes Tammi Sou, MD  promethazine (PHENERGAN) 12.5 MG tablet  Take 1-2 tablet every 6 hours as needed for nausea 11/13/11  Yes Tammi Sou, MD  tiZANidine (ZANAFLEX) 4 MG tablet Take 1 tablet by mouth Three times a day. 04/17/11  Yes Historical Provider, MD  zoster vaccine live, PF, (ZOSTAVAX) 70350 UNT/0.65ML injection Inject 19,400 Units into the skin once. 12/27/11  Yes Tammi Sou, MD    Allergies:   Allergies  Allergen Reactions  . Codeine     REACTION: itching  . Latex     blisters  . Penicillins     REACTION: took large quantities as a child and was told to never take again    Social History:  reports that she quit smoking about 3 months ago. Her smoking use included Cigarettes. She smoked 1.00 pack per day. She has never used smokeless tobacco. She reports that she does not drink alcohol or use illicit drugs.  Family History: None   Physical Exam: Filed Vitals:   11/20/12 1732 11/20/12 1930  BP: 107/61 118/94  Pulse: 108 103  Temp: 98.9 F (37.2 C) 98.3 F (36.8 C)  TempSrc: Oral Oral  Resp:  18  SpO2: 92% 97%   General appearance: alert, cooperative and no distress Head: Normocephalic, without obvious abnormality, atraumatic Eyes: negative Nose: Nares normal. Septum midline. Mucosa normal. No drainage or sinus tenderness. Neck: no JVD and supple, symmetrical, trachea midline Lungs: clear to auscultation bilaterally Heart: regular rate and rhythm, S1, S2 normal, no murmur, click, rub or gallop Abdomen: soft, non-tender; bowel sounds normal; no masses,  no organomegaly Extremities: extremities normal, atraumatic, no cyanosis or edema Pulses: 2+ and symmetric Skin: Skin color, texture, turgor normal. No rashes or lesions Neurologic: Grossly normal  Labs on Admission:   Recent Labs  11/19/12 1418 11/20/12 1820  NA 138 136  K 4.3 3.9  CL 97 99  CO2 32 27  GLUCOSE 132* 115*  BUN 13 14  CREATININE 1.4* 1.32*  CALCIUM 9.3 9.2    Recent Labs  11/19/12 1418 11/20/12 1820  AST 27 62*  ALT 19 23  ALKPHOS  110 117  BILITOT 0.6 0.5  PROT 7.6 7.5  ALBUMIN 3.7 3.2*    Recent Labs  11/19/12 1418  LIPASE 8.0*    Recent Labs  11/19/12 1418 11/20/12 1820  WBC 9.4 7.0  NEUTROABS 5.3 3.8  HGB 13.0 12.3  HCT 39.2 37.8  MCV 91.5 92.6  PLT 292.0 242    Radiological Exams on Admission: Ct Abdomen Pelvis Wo Contrast  11/19/2012   CLINICAL DATA:  Left flank pain. Prior appendectomy and gastric bypass.  EXAM: CT ABDOMEN AND PELVIS WITHOUT CONTRAST  TECHNIQUE: Multidetector CT imaging  of the abdomen and pelvis was performed following the standard protocol without intravenous contrast.  COMPARISON:  05/10/2010  FINDINGS: Old granulomatous disease noted in the chest and spleen.  Prominent diffuse hepatic steatosis. Spleen and adrenal glands unremarkable. Gallbladder surgically absent.  Left hydronephrosis noted with hydroureter extending down to a 9 x 5 mm left UVJ calculus. No additional left-sided calculi noted. No right-sided renal or ureteral calculi. Bladder otherwise unremarkable.  Postoperative findings from prior gastric bypass. Borderline prominence of formed stool in the proximal half of the colon.  A left periaortic lymph node measures 0.9 cm in short axis, and additional upper normal sized retroperitoneal lymph nodes are observed without overt pathologic adenopathy. Borderline enlarged peripancreatic lymph nodes are similar to the prior exam. Uterus absent.  IMPRESSION: 1. Obstructive left UVJ calculus measuring 0.9 x 0.5 cm is associated with moderate left hydronephrosis and moderate left hydroureter. No additional renal calculi. 2. Prominent diffuse hepatic steatosis. 3. Chronic upper normal sized retroperitoneal and peripancreatic lymph nodes.   Electronically Signed   By: Sherryl Barters M.D.   On: 11/19/2012 16:30   Dg Chest 2 View  11/19/2012   CLINICAL DATA:  Left basilar crackles and bronchial breath sounds.  EXAM: CHEST  2 VIEW  COMPARISON:  Chest radiograph 09/16/2012  FINDINGS: Heart  and mediastinal contours are normal. There is atherosclerotic calcification of the thoracic aortic arch. Pulmonary vascularity is normal.  There are new linear opacities in the left lower lobe. The right lung is clear.  Negative for pleural effusion or pneumothorax.  Surgical clips are noted in the abdomen bilaterally.  Cervical spine discussion cortical plate and screw changes in cervical spine noted. No acute osseous abnormality.  IMPRESSION: New linear opacities in the left lower lobe have imaging features most suggestive of atelectasis. Early infection is difficult to exclude.   Electronically Signed   By: Curlene Dolphin M.D.   On: 11/19/2012 16:13    Assessment/Plan  57 yo female with obstructive left upj stone Principal Problem:   Renal stone Active Problems:   Type II or unspecified type diabetes mellitus without mention of complication, not stated as uncontrolled   BIPOLAR II DISORDER   RESTLESS LEG SYNDROME  obs overnight.  Keep npo after midnight.  Cover with ssi.  Urology aware and expect to do procedure around noon tomorrow.  No abx at this time.    Roni Friberg A 11/20/2012, 8:35 PM

## 2012-11-20 NOTE — ED Notes (Signed)
Pt refused to give urine states that she just gave urine at the office and has lab results at bedside.

## 2012-11-20 NOTE — ED Provider Notes (Signed)
Medical screening examination/treatment/procedure(s) were performed by non-physician practitioner and as supervising physician I was immediately available for consultation/collaboration.  Richarda Blade, MD 11/20/12 2214

## 2012-11-20 NOTE — ED Notes (Signed)
Called to give report nurse unavailable will call back.

## 2012-11-21 ENCOUNTER — Encounter (HOSPITAL_COMMUNITY): Payer: Self-pay | Admitting: Anesthesiology

## 2012-11-21 ENCOUNTER — Encounter (HOSPITAL_COMMUNITY)
Admission: EM | Disposition: A | Discharge status: Home / Self Care | Payer: Self-pay | Source: Home / Self Care | Attending: Emergency Medicine

## 2012-11-21 ENCOUNTER — Observation Stay (HOSPITAL_COMMUNITY): Payer: Medicare PPO

## 2012-11-21 ENCOUNTER — Observation Stay (HOSPITAL_COMMUNITY): Payer: Medicare PPO | Admitting: Certified Registered Nurse Anesthetist

## 2012-11-21 ENCOUNTER — Encounter (HOSPITAL_COMMUNITY): Payer: Medicare PPO | Admitting: Certified Registered Nurse Anesthetist

## 2012-11-21 DIAGNOSIS — N1330 Unspecified hydronephrosis: Secondary | ICD-10-CM

## 2012-11-21 DIAGNOSIS — F3189 Other bipolar disorder: Secondary | ICD-10-CM

## 2012-11-21 HISTORY — PX: CYSTOSCOPY/RETROGRADE/URETEROSCOPY/STONE EXTRACTION WITH BASKET: SHX5317

## 2012-11-21 LAB — GLUCOSE, CAPILLARY
Glucose-Capillary: 112 mg/dL — ABNORMAL HIGH (ref 70–99)
Glucose-Capillary: 145 mg/dL — ABNORMAL HIGH (ref 70–99)

## 2012-11-21 LAB — BASIC METABOLIC PANEL
BUN: 13 mg/dL (ref 6–23)
CO2: 25 mEq/L (ref 19–32)
Chloride: 103 mEq/L (ref 96–112)
GFR calc Af Amer: 64 mL/min — ABNORMAL LOW (ref >90)
GFR calc non Af Amer: 55 mL/min — ABNORMAL LOW (ref >90)
Potassium: 3.7 mEq/L (ref 3.5–5.1)
Sodium: 138 mEq/L (ref 135–145)

## 2012-11-21 LAB — CBC
HCT: 37.3 % (ref 36.0–46.0)
Hemoglobin: 12.1 g/dL (ref 12.0–15.0)
MCHC: 32.4 g/dL (ref 30.0–36.0)
RBC: 4.03 MIL/uL (ref 3.87–5.11)
WBC: 5.1 10*3/uL (ref 4.0–10.5)

## 2012-11-21 SURGERY — CYSTOSCOPY, WITH CALCULUS REMOVAL USING BASKET
Anesthesia: General | Site: Ureter | Laterality: Left | Wound class: Clean Contaminated

## 2012-11-21 MED ORDER — LACTATED RINGERS IV SOLN
INTRAVENOUS | Status: DC | PRN
  Administered 2012-11-21: 11:00:00 via INTRAVENOUS

## 2012-11-21 MED ORDER — PROMETHAZINE HCL 25 MG/ML IJ SOLN: 6.2500 mg | INTRAMUSCULAR | Status: DC | PRN

## 2012-11-21 MED ORDER — KETOROLAC TROMETHAMINE 30 MG/ML IJ SOLN
INTRAMUSCULAR | Status: DC | PRN
  Administered 2012-11-21: 30 mg via INTRAVENOUS

## 2012-11-21 MED ORDER — LIDOCAINE HCL 2 % EX GEL
CUTANEOUS | Status: AC
  Filled 2012-11-21: qty 10

## 2012-11-21 MED ORDER — KETOROLAC TROMETHAMINE 30 MG/ML IJ SOLN
30.0000 mg | Freq: Four times a day (QID) | INTRAMUSCULAR | Status: DC | PRN
  Administered 2012-11-21: 30 mg via INTRAVENOUS
  Filled 2012-11-21: qty 1

## 2012-11-21 MED ORDER — PROPOFOL 10 MG/ML IV BOLUS
INTRAVENOUS | Status: DC | PRN
  Administered 2012-11-21: 150 mg via INTRAVENOUS
  Administered 2012-11-21: 50 mg via INTRAVENOUS

## 2012-11-21 MED ORDER — ONDANSETRON HCL 4 MG/2ML IJ SOLN
INTRAMUSCULAR | Status: DC | PRN
  Administered 2012-11-21: 4 mg via INTRAMUSCULAR

## 2012-11-21 MED ORDER — TIZANIDINE HCL 4 MG PO TABS
4.0000 mg | ORAL_TABLET | Freq: Three times a day (TID) | ORAL | Status: DC
  Administered 2012-11-21: 4 mg via ORAL
  Filled 2012-11-21 (×2): qty 1

## 2012-11-21 MED ORDER — ACETAMINOPHEN 10 MG/ML IV SOLN
1000.0000 mg | INTRAVENOUS | Status: AC
  Administered 2012-11-21: 1000 mg via INTRAVENOUS
  Filled 2012-11-21: qty 100

## 2012-11-21 MED ORDER — SODIUM CHLORIDE 0.45 % IV SOLN
INTRAVENOUS | Status: DC
  Administered 2012-11-21: 13:00:00 via INTRAVENOUS

## 2012-11-21 MED ORDER — BISACODYL 5 MG PO TBEC: 5.0000 mg | DELAYED_RELEASE_TABLET | Freq: Every day | ORAL | Status: DC | PRN

## 2012-11-21 MED ORDER — MIDAZOLAM HCL 5 MG/5ML IJ SOLN
INTRAMUSCULAR | Status: DC | PRN
  Administered 2012-11-21: 2 mg via INTRAVENOUS

## 2012-11-21 MED ORDER — LIDOCAINE HCL (CARDIAC) 20 MG/ML IV SOLN
INTRAVENOUS | Status: DC | PRN
  Administered 2012-11-21: 100 mg via INTRAVENOUS

## 2012-11-21 MED ORDER — VITAMIN D3 25 MCG (1000 UNIT) PO TABS
2000.0000 [IU] | ORAL_TABLET | Freq: Every day | ORAL | Status: DC
  Filled 2012-11-21: qty 2

## 2012-11-21 MED ORDER — SODIUM CHLORIDE 0.9 % IR SOLN
Status: DC | PRN
  Administered 2012-11-21: 4000 mL

## 2012-11-21 MED ORDER — IOHEXOL 300 MG/ML  SOLN
INTRAMUSCULAR | Status: DC | PRN
  Administered 2012-11-21: 50 mL

## 2012-11-21 MED ORDER — OXYCODONE HCL 10 MG PO TABS: 10.0000 mg | ORAL_TABLET | Freq: Four times a day (QID) | ORAL | Status: DC | PRN

## 2012-11-21 MED ORDER — OXYCODONE HCL ER 15 MG PO T12A: 30.0000 mg | EXTENDED_RELEASE_TABLET | Freq: Three times a day (TID) | ORAL | Status: DC

## 2012-11-21 MED ORDER — MIRTAZAPINE 45 MG PO TABS
45.0000 mg | ORAL_TABLET | Freq: Every day | ORAL | Status: DC
  Filled 2012-11-21: qty 1

## 2012-11-21 MED ORDER — GABAPENTIN 300 MG PO CAPS
900.0000 mg | ORAL_CAPSULE | Freq: Every day | ORAL | Status: DC
  Filled 2012-11-21: qty 3

## 2012-11-21 MED ORDER — PRAMIPEXOLE DIHYDROCHLORIDE 0.25 MG PO TABS
0.5000 mg | ORAL_TABLET | Freq: Every day | ORAL | Status: DC
  Filled 2012-11-21: qty 2

## 2012-11-21 MED ORDER — FENTANYL CITRATE 0.05 MG/ML IJ SOLN: 25.0000 ug | INTRAMUSCULAR | Status: DC | PRN

## 2012-11-21 MED ORDER — DOCUSATE SODIUM 100 MG PO CAPS: 100.0000 mg | ORAL_CAPSULE | Freq: Two times a day (BID) | ORAL | Status: DC

## 2012-11-21 MED ORDER — BELLADONNA ALKALOIDS-OPIUM 16.2-60 MG RE SUPP
RECTAL | Status: AC
  Filled 2012-11-21: qty 1

## 2012-11-21 MED ORDER — CLONAZEPAM 1 MG PO TABS
2.0000 mg | ORAL_TABLET | Freq: Three times a day (TID) | ORAL | Status: DC | PRN
  Administered 2012-11-21: 2 mg via ORAL
  Filled 2012-11-21: qty 2

## 2012-11-21 MED ORDER — BUPROPION HCL ER (XL) 150 MG PO TB24
150.0000 mg | ORAL_TABLET | Freq: Every day | ORAL | Status: DC
  Filled 2012-11-21: qty 1

## 2012-11-21 SURGICAL SUPPLY — 15 items
BAG URO CATCHER STRL LF (DRAPE) ×2 IMPLANT
BASKET ZERO TIP NITINOL 2.4FR (BASKET) ×1 IMPLANT
BSKT STON RTRVL ZERO TP 2.4FR (BASKET) ×1
CATH INTERMIT  6FR 70CM (CATHETERS) ×2 IMPLANT
CLOTH BEACON ORANGE TIMEOUT ST (SAFETY) ×2 IMPLANT
DRAPE CAMERA CLOSED 9X96 (DRAPES) ×2 IMPLANT
GLOVE BIOGEL M STRL SZ7.5 (GLOVE) ×2 IMPLANT
GOWN STRL REIN XL XLG (GOWN DISPOSABLE) ×2 IMPLANT
GUIDEWIRE STR DUAL SENSOR (WIRE) ×2 IMPLANT
MANIFOLD NEPTUNE II (INSTRUMENTS) ×2 IMPLANT
NS IRRIG 1000ML POUR BTL (IV SOLUTION) ×2 IMPLANT
PACK CYSTO (CUSTOM PROCEDURE TRAY) ×2 IMPLANT
SCRUB PCMX 4 OZ (MISCELLANEOUS) ×2 IMPLANT
STENT CONTOUR 6FRX24X.038 (STENTS) ×1 IMPLANT
TUBING CONNECTING 10 (TUBING) ×2 IMPLANT

## 2012-11-21 NOTE — Progress Notes (Signed)
Patient refused pain medication prescription as it would conflict with her pain management clinic contract. Gave back to Dr. Sheran Fava and it was placed in the shredder. Remer Macho RN

## 2012-11-21 NOTE — Progress Notes (Signed)
pacu nursing:  Per dr. Gaynelle Arabian, pt receives no narcotics.  Iv tylenol and toradol given in operating room

## 2012-11-21 NOTE — Anesthesia Postprocedure Evaluation (Signed)
  Anesthesia Post-op Note  Patient: Diana [name]  Procedure(s) Performed: Procedure(s) (LRB): CYSTOSCOPY/RETROGRADE/URETEROSCOPY/STONE EXTRACTION WITH BASKET insertion double j stent (Left)  Patient Location: PACU  Anesthesia Type: General  Level of Consciousness: awake and alert   Airway and Oxygen Therapy: Patient Spontanous Breathing  Post-op Pain: mild  Post-op Assessment: Post-op Vital signs reviewed, Patient's Cardiovascular Status Stable, Respiratory Function Stable, Patent Airway and No signs of Nausea or vomiting  Last Vitals:  Filed Vitals:   11/21/12 1300  BP: 130/68  Pulse: 68  Temp: 36.7 C  Resp: 20    Post-op Vital Signs: stable   Complications: No apparent anesthesia complications

## 2012-11-21 NOTE — Preoperative (Signed)
Beta Blockers   Reason not to administer Beta Blockers:Not Applicable 

## 2012-11-21 NOTE — Progress Notes (Signed)
pacu nursing:  [facility] rn brought patients dentures to pacu and gave them to patient.  She placed them in her mouth.

## 2012-11-21 NOTE — H&P (Signed)
57 yo diabetic bipolar female with  5 days of Left mid and low back pain, radiating to the left upper quadrant. Pain worsens with movement. Constipation x 4 days, despite stool softerner. No gross hematuria. She has had 3 previous kidney stones-passed spontaneously.She was seen at [facility], with CT showing left hydronephrosis and hydroureter extending down to a 7m x 567mleft UVJ stone. Normal Right ureter.     Gastric bypass 19 years ago. No stones since 2005.    [place] 11 [place] [facility] 2012 with chronic nack pain.    Tobacco: 40 pk yrs tobacco use. None sinc July.   Past Medical History Problems  1. History of  Anxiety (Symptom) 300.00 2. History of  Bipolar Disorder 296.00 3. History of  Chronic Fatigue Syndrome 780.71 4. History of  Diabetes Mellitus 250.00 5. History of  Insomnia 780.52 6. History of  Iron Deficiency 280.9 7. History of  Nephrolithiasis V13.01 8. History of  Obesity 278.00 9. History of  Osteopenia 733.90 10. History of  Restless Legs Syndrome 333.94 11. History of  Vitamin B Deficiency 266.9 12. History of  Vitamin D Deficiency 268.9  Surgical History Problems  1. History of  Appendectomy 2. History of  Bladder Surgery 3. History of  Cholecystectomy 4. History of  Gastric Surgery For Morbid Obesity Gastric Bypass 5. History of  Hernia Repair 6. History of  Neck Surgery 7. History of  Tonsillectomy 8. History of  Total Abdominal Hysterectomy V45.77  Current Meds 1. Abilify 10 MG Oral Tablet; Therapy: (Recorded:10Oct2014) to 2. Albuterol Sulfate HFA 108 MCG/ACT AERS; Therapy: (Recorded:10Oct2014) to 3. Aluminum Hydroxide SUSP; Therapy: (Recorded:10Oct2014) to 4. Aspirin 325 MG Oral Tablet; Therapy: (Recorded:10Oct2014) to 5. BuPROPion HCl ER (SR) 150 MG Oral Tablet Extended Release 12 Hour; Therapy:  (Recorded:10Oct2014) to 6. Calcium Carbonate TABS;  Therapy: (Recorded:10Oct2014) to 7. Chantix 1 MG Oral Tablet; Therapy: (Recorded:10Oct2014) to 8. Clindamycin HCl 300 MG Oral Capsule; Therapy: (Recorded:10Oct2014) to 9. ClonazePAM 2 MG Oral Tablet; Therapy: (Recorded:10Oct2014) to 10. Ferrous Sulfate 325 (65 Fe) MG Oral Tablet; Therapy: (Recorded:10Oct2014) to 11. Gabapentin 300 MG Oral Capsule; Therapy: (Recorded:10Oct2014) to 12. Indomethacin 50 MG Oral Capsule; Therapy: (Recorded:10Oct2014) to 13. MetFORMIN HCl 500 MG Oral Tablet; Therapy: (Recorded:10Oct2014) to 14. Mirtazapine 45 MG Oral Tablet; Therapy: (Recorded:10Oct2014) to 15. Multiple Vitamin TABS; Therapy: (Recorded:10Oct2014) to 16. Mupirocin 2 % External Ointment; Therapy: (Recorded:10Oct2014) to 17. NovoFine 32G X 6 MM Miscellaneous; Therapy: (Recorded:10Oct2014) to 18. OxyCODONE HCl 30 MG Oral Tablet; Therapy: (Recorded:10Oct2014) to 19. Pramipexole Dihydrochloride 0.125 MG Oral Tablet; Therapy: (Recorded:10Oct2014) to 20. Promethazine HCl 12.5 MG Oral Tablet; Therapy: (Recorded:10Oct2014) to 21. Protonix 40 MG Oral Packet; Therapy: (Recorded:10Oct2014) to 22. TiZANidine HCl 4 MG Oral Tablet; Therapy: (Recorded:10Oct2014) to 23. Vitamin B-12 SOLN; Therapy: (Recorded:10Oct2014) to 24. Vitamin D3 2000 UNIT Oral Tablet; Therapy: (Recorded:10Oct2014) to 25. Vitamin D3 50000 UNIT Oral Capsule; Therapy: (Recorded:10Oct2014) to 26. Zofran 8 MG Oral Tablet; Therapy: (Recorded:10Oct2014) to  Allergies Medication  1. Adhesive Tape TAPE 2. Codeine Derivatives 3. Latex Gloves MISC 4. Penicillins  Review of Systems Skin, otolaryngeal, hematologic/lymphatic, cardiovascular, pulmonary, endocrine, musculoskeletal and neurological system(s) were reviewed and pertinent findings if present are noted.  Genitourinary: urinary frequency, urinary urgency, dysuria, nocturia, urinary hesitancy, urinary stream starts and stops, incomplete emptying of bladder, hematuria and initiating urination  requires straining, but no incontinence, no urethral discharge, no foul smelling  urine, urine not cloudy, no oliguria, no vaginal discharge, no pelvic pain and no suprapubic pain.  Gastrointestinal: nausea, flank pain, abdominal pain and constipation, but no heartburn, no diarrhea and no melena.  Constitutional: feeling tired (fatigue) and recent 5 lbs lb weight loss, but no fever and no night sweats.  Eyes: problems with eyesight.  Psychiatric: emotional problems, depression and anxiety.    Vitals Vital Signs [Data Includes: Last 1 Day]  10Oct2014 05:03PM  BMI Calculated: 27.79 BSA Calculated: 1.69 Height: 5 ft 2 in 10Oct2014 04:32PM  Weight: 151 lb  Blood Pressure: 94 / 67 Temperature: 98.8 F Heart Rate: 112  Physical Exam Constitutional: Well nourished and well developed . In acute distress. The patient appears well hydrated.  ENT:. The ears and nose are normal in appearance. Examination of the teeth show poor dentition.  Neck: The appearance of the neck is normal.  Pulmonary: No respiratory distress.  Cardiovascular:. No peripheral edema.  Abdomen: The abdomen is rounded. The abdomen is normal to percussion. mild left CVA tenderness. Bowel sounds are normal. No hernias are palpable.  Lymphatics: The femoral and inguinal nodes are not enlarged or tender.  Skin: Normal skin turgor, no visible rash and no visible skin lesions.  Neuro/Psych:. Mood and affect are appropriate.    Results/Data Urine [Data Includes: Last 1 Day]   10Oct2014  COLOR YELLOW   APPEARANCE CLEAR   SPECIFIC GRAVITY 1.025   pH 5.5   GLUCOSE NEG mg/dL  BILIRUBIN NEG   KETONE NEG mg/dL  BLOOD LARGE   PROTEIN NEG mg/dL  UROBILINOGEN 0.2 mg/dL  NITRITE NEG   LEUKOCYTE ESTERASE LARGE   SQUAMOUS EPITHELIAL/HPF MODERATE   WBC NONE SEEN WBC/hpf  RBC 3-6 RBC/hpf  BACTERIA NONE SEEN   CRYSTALS NONE SEEN   CASTS NONE SEEN    Assessment Assessed  1. Distal Ureteral Stone On The Left 592.1 2. Obesity  278.00 3. Bipolar Disorder 296.00 4. Diabetes Mellitus 250.00   Case discussed with Dr. Alvino Chapel in [place]. Pt needs cysto and left retrograde pyelogram and laser lithotripsy of lower ureteral stone, and JJ stent tomorrow. She needs admission tonight for diabetes/pain/psyche/metabolic evaluation.   Plan Health Maintenance (V70.0)  1. UA With REFLEX  Done: [id number] 04:17PM     1.  [facility] ER, for [facility] Admission 2. NPO post MN 3. For surgery tomorrow, cysto, Left retrograde pyelogram, laser lithotripsy and JJ stent.Probably can be discharged post op if she is medically stable.   Discussion/Summary  cc: Dr. Anitra Lauth, [place], [place]     Signatures Electronically signed by : Carolan Clines, M.D.; Nov 20 2012  5:09PM

## 2012-11-21 NOTE — Discharge Summary (Addendum)
Physician Discharge Summary  Diana [name] GYF:[id number] DOB: 1955/03/09 DOA: 11/20/2012  PCP: Diana Sou, MD  Admit date: 11/20/2012 Discharge date: 11/21/2012  Recommendations for Outpatient Follow-up:  1. Follow up with urology in 2 weeks, patient to call to schedule the appointment.  Please follow up on final report of urine culture 2. Behavioral health, primary care doctor, or psychiatrist as needed for management of bipolar disorder/depression  Discharge Diagnoses:  Principal Problem:   Renal stone Active Problems:   Type II or unspecified type diabetes mellitus without mention of complication, not stated as uncontrolled   BIPOLAR II DISORDER   RESTLESS LEG SYNDROME   Hydronephrosis   Discharge Condition: stable, improved  Diet recommendation: diabetic  Wt Readings from Last 3 Encounters:  11/20/12 72.666 kg (160 lb 3.2 oz)  11/20/12 72.666 kg (160 lb 3.2 oz)  11/19/12 72.122 kg (159 lb)    History of present illness:  57 yo female with several days of left flank pain h/o multiple renal stones in past has outpt ct scan showing an obstructive stone on the left, saw urology as outpt today who sent to ED for admission for procedure tomorrow at noon. Pt has had some n/v nonbloody. No fevers. Eating a meal of fried foods right now without difficulty. No dysuria or hematuria. O/w no active issues except uncontrolled pain. Did have recent cervical spine fusion anterior approach with well healing scar on neck.  Hospital Course:   Diana [name] was admitted to the hospital.  Her labs demonstrated stable creatinine at 1.32 which trended down to 1.11.  CXR demonstrated mild left basilar atelectasis.   Her UA was notable for moderate LE and 11-20 WBC and her urine culture is pending.  She was seen by Diana [name], urology, who recommended cystoscopy, ureteroscopy and stone extraction.  Her stone was extracted without complication, however, she had hydronephrosis s/p stone  removal and underwent double-J-stent placement on the left.    T2DM:  Her metformin was held and she was given sliding scale insulin.  Advised to resume home regimen.  Bipolar disorder with sad affect and multiple life stressors.  Given information about behavioral health and advised to follow up with primary psychiatric physician for counseling and medication adjustment if indicated.  Continued bupropion, clonazepam, mirtazapine  Chronic pain with acute flank pain due to kidney stone.  Given IV pain medication while NPO and should resume her gabapentin, oxycontin, tizanidine at discharge.    No PRN narcotic pain medication was given at discharge per patient request.  She may use OTC tylenol and motrin.    Rest of her chronic medical problems were stable.    Procedures:  Stone extraction and double-J stent placement on the left 11/21/2012  Consultations:  Urology, Diana [name]  Discharge Exam: Filed Vitals:   11/21/12 1300  BP: 130/68  Pulse: 68  Temp: 98 F (36.7 C)  Resp: 20   Filed Vitals:   11/21/12 1240 11/21/12 1245 11/21/12 1250 11/21/12 1300  BP:  118/54  130/68  Pulse: 80 82 75 68  Temp:  98 F (36.7 C)  98 F (36.7 C)  TempSrc:      Resp: 14 14 21 20   Height:      Weight:      SpO2: 96% 98% 99% 98%    General: CF, no acute distress HEENT:  NCAT, MMM Cardiovascular: RRR, no mrg, 2+ pulses, warm extremities Respiratory: Rales at the bilateral bases without rhonchi or wheeze, no increased WOB ABD:  NABS, soft, mildly distended, TTP diffusely without rebound or guarding MSK: No LEE  Discharge Instructions      Discharge Orders   Future Appointments Provider Department Dept Phone   11/26/2012 1:30 PM Diana Sou, MD [facility]IMARY [facility] AT [place] [phone]   Future Orders Complete By Expires   Call MD for:  difficulty breathing, headache or visual disturbances  As directed    Call MD for:  extreme fatigue  As directed    Call MD for:  hives   As directed    Call MD for:  persistant dizziness or light-headedness  As directed    Call MD for:  persistant nausea and vomiting  As directed    Call MD for:  severe uncontrolled pain  As directed    Call MD for:  temperature >100.4  As directed    Diet Carb Modified  As directed    Discharge instructions  As directed    Comments:     You were hospitalized with a symptomatic kidney stone and you had your stone removed and a ureteral stent placed.  Please drink plenty of fluids and follow up with Diana [name] in two weeks by calling his office on Monday to schedule an appointment.  You may have some bleeding and pain after your procedure.  Please read through your discharge information carefully and call the Urology office if you have any questions or concerns.  Please follow up with your psychiatrist, primary care doctor, or behavioral health hospital regarding your depression for medication management and ongoing counseling.   Driving Restrictions  As directed    Comments:     Do not drive or operate heavy machinery while taking additional narcotic medication such as oxycodone   Increase activity slowly  As directed        Medication List         bisacodyl 5 MG EC tablet  Commonly known as:  bisacodyl  Take 1 tablet (5 mg total) by mouth daily as needed for constipation.     buPROPion 150 MG 24 hr tablet  Commonly known as:  WELLBUTRIN XL  TAKE 1 TABLET (150 MG TOTAL) BY MOUTH DAILY.     clonazePAM 2 MG tablet  Commonly known as:  KLONOPIN  TALE 1 TABLET BY MOUTH THREE TIMES A DAY AS NEEDED FOR ANXIETY     cyanocobalamin 1000 MCG/ML injection  Commonly known as:  (VITAMIN B-12)  Inject 1 mL (1,000 mcg total) into the muscle every 14 (fourteen) days.     docusate sodium 100 MG capsule  Commonly known as:  COLACE  Take 1 capsule (100 mg total) by mouth 2 (two) times daily.     gabapentin 300 MG capsule  Commonly known as:  NEURONTIN  Take 900-1,200 mg by mouth daily. 900  mg at bedtime can take up to 1200 mg at bedtime     magic mouthwash w/lidocaine Soln  1 tsp swish and spit qAC and qhs prn     metFORMIN 500 MG tablet  Commonly known as:  GLUCOPHAGE  Take 1 tablet (500 mg total) by mouth 2 (two) times daily with a meal.     mirtazapine 45 MG tablet  Commonly known as:  REMERON  Take 1 tablet (45 mg total) by mouth at bedtime.     ondansetron 8 MG disintegrating tablet  Commonly known as:  ZOFRAN-ODT  Take 1 tablet (8 mg total) by mouth every 8 (eight) hours as needed for nausea.  oxycodone 30 MG Tb12  Commonly known as:  OXYCONTIN  Take 30 mg by mouth 3 (three) times daily.     pramipexole 0.125 MG tablet  Commonly known as:  MIRAPEX  TAKE 4-5 TABLETS BY MOUTH AT BEDTIME     promethazine 12.5 MG tablet  Commonly known as:  PHENERGAN  Take 1-2 tablet every 6 hours as needed for nausea     tiZANidine 4 MG tablet  Commonly known as:  ZANAFLEX  Take 1 tablet by mouth Three times a day.     TUMS ULTRA 1000 400 MG tablet  Generic drug:  calcium elemental as carbonate  Chew 1,000 mg by mouth daily.     Vitamin D3 2000 UNITS capsule  Take 1 capsule (2,000 Units total) by mouth daily.     zoster vaccine live (PF) 19400 UNT/0.65ML injection  Commonly known as:  ZOSTAVAX  Inject 19,400 Units into the skin once.       Follow-up Information   Follow up with Diana Sou, MD.   Specialty:  Family Medicine   Contact information:   [address] [place] [address] [phone]       Follow up with Carolan Clines I, MD. Schedule an appointment as soon as possible for a visit in 2 weeks.   Specialty:  Urology   Contact information:   [address] [place] [address] [phone]       Follow up with [facility].   Specialty:  Behavioral Health   Contact information:   [place]  [address] [phone]       The results of significant diagnostics from this hospitalization (including imaging, microbiology, ancillary and laboratory) are listed below for reference.    Significant Diagnostic Studies: Ct Abdomen Pelvis Wo Contrast  11/19/2012   CLINICAL DATA:  Left flank pain. Prior appendectomy and gastric bypass.  EXAM: CT ABDOMEN AND PELVIS WITHOUT CONTRAST  TECHNIQUE: Multidetector CT imaging of the abdomen and pelvis was performed following the standard protocol without intravenous contrast.  COMPARISON:  05/10/2010  FINDINGS: Old granulomatous disease noted in the chest and spleen.  Prominent diffuse hepatic steatosis. Spleen and adrenal glands unremarkable. Gallbladder surgically absent.  Left hydronephrosis noted with hydroureter extending down to a 9 x 5 mm left UVJ calculus. No additional left-sided calculi noted. No right-sided renal or ureteral calculi. Bladder otherwise unremarkable.  Postoperative findings from prior gastric bypass. Borderline prominence of formed stool in the proximal half of the colon.  A left periaortic lymph node measures 0.9 cm in Aut[place] Human axis, and additional upper normal sized retroperitoneal lymph nodes are observed without overt pathologic adenopathy. Borderline enlarged peripancreatic lymph nodes are similar to the prior exam. Uterus absent.  IMPRESSION: 1. Obstructive left UVJ calculus measuring 0.9 x 0.5 cm is associated with moderate left hydronephrosis and moderate left hydroureter. No additional renal calculi. 2. Prominent diffuse hepatic steatosis. 3. Chronic upper normal sized retroperitoneal and peripancreatic lymph nodes.   Electronically Signed   By: Sherryl Barters M.D.   On: 11/19/2012 16:30   Dg Chest 2 View  11/21/2012   CLINICAL DATA:  History of smoking, preop for kidney stone  EXAM: CHEST  2 VIEW  COMPARISON:  11/19/2012  FINDINGS: Heart size and vascular pattern are normal. Right lung is  clear. Horizontal opacity left base most  consistent with discoid atelectasis. No pleural effusions.  IMPRESSION: Mild left base atelectasis. Otherwise negative.   Electronically Signed   By: Skipper Cliche M.D.   On: 11/21/2012 07:10   Dg Chest 2 View  11/19/2012   CLINICAL DATA:  Left basilar crackles and bronchial breath sounds.  EXAM: CHEST  2 VIEW  COMPARISON:  Chest radiograph 09/16/2012  FINDINGS: Heart and mediastinal contours are normal. There is atherosclerotic calcification of the thoracic aortic arch. Pulmonary vascularity is normal.  There are new linear opacities in the left lower lobe. The right lung is clear.  Negative for pleural effusion or pneumothorax.  Surgical clips are noted in the abdomen bilaterally.  Cervical spine discussion cortical plate and screw changes in cervical spine noted. No acute osseous abnormality.  IMPRESSION: New linear opacities in the left lower lobe have imaging features most suggestive of atelectasis. Early infection is difficult to exclude.   Electronically Signed   By: Curlene Dolphin M.D.   On: 11/19/2012 16:13    Microbiology: Recent Results (from the past 240 hour(s))  URINE CULTURE     Status: None   Collection Time    11/19/12  2:18 PM      Result Value Range Status   Colony Count NO GROWTH   Final   Organism ID, Bacteria NO GROWTH   Final  SURGICAL PCR SCREEN     Status: None   Collection Time    11/21/12 11:56 AM      Result Value Range Status   MRSA, PCR NEGATIVE  NEGATIVE Final   Staphylococcus aureus NEGATIVE  NEGATIVE Final   Comment:            The Xpert SA Assay (FDA     approved for NASAL specimens     in patients over 13 years of age),     is one component of     a comprehensive surveillance     program.  Test performance has     been validated by [employer] for patients greater     than or equal to 44 year old.     It is not intended     to diagnose infection nor to     guide or monitor treatment.     Labs: Basic Metabolic Panel:  Recent Labs Lab  11/19/12 1418 11/20/12 1820 11/21/12 0410  NA 138 136 138  K 4.3 3.9 3.7  CL 97 99 103  CO2 32 27 25  GLUCOSE 132* 115* 134*  BUN 13 14 13   CREATININE 1.4* 1.32* 1.11*  CALCIUM 9.3 9.2 8.3*   Liver Function Tests:  Recent Labs Lab 11/19/12 1418 11/20/12 1820  AST 27 62*  ALT 19 23  ALKPHOS 110 117  BILITOT 0.6 0.5  PROT 7.6 7.5  ALBUMIN 3.7 3.2*    Recent Labs Lab 11/19/12 1418  LIPASE 8.0*   No results found for this basename: AMMONIA,  in the last 168 hours CBC:  Recent Labs Lab 11/19/12 1418 11/20/12 1820 11/21/12 0410  WBC 9.4 7.0 5.1  NEUTROABS 5.3 3.8  --   HGB 13.0 12.3 12.1  HCT 39.2 37.8 37.3  MCV 91.5 92.6 92.6  PLT 292.0 242 242   Cardiac Enzymes: No results found for this basename: CKTOTAL, CKMB, CKMBINDEX, TROPONINI,  in the last 168 hours BNP: BNP (last 3 results) No results found for this basename: PROBNP,  in the last 8760 hours  CBG:  Recent Labs Lab 11/20/12 2300 11/21/12 0739 11/21/12 1059 11/21/12 1228  GLUCAP 263* 123* 112* 145*    Time coordinating discharge: 45 minutes  Signed:  Shelene Krage  [facility] 11/21/2012, 4:08 PM

## 2012-11-21 NOTE — Interval H&P Note (Signed)
History and Physical Interval Note:  11/21/2012 11:05 AM  Diana [name]  has presented today for surgery, with the diagnosis of left ureteral stone  The various methods of treatment have been discussed with the patient and family. After consideration of risks, benefits and other options for treatment, the patient has consented to  Procedure(s): CYSTOSCOPY/RETROGRADE/URETEROSCOPY/STONE EXTRACTION WITH BASKET WITH LASER (Left) as a surgical intervention .  The patient's history has been reviewed, patient examined, no change in status, stable for surgery.  I have reviewed the patient's chart and labs.  Questions were answered to the patient's satisfaction.     Carolan Clines I

## 2012-11-21 NOTE — Op Note (Signed)
Pre-operative diagnosis :   Impacted left ureterovesical junction stone  Postoperative diagnosis:  Same  Operation:  Cystourethroscopy, left retropyelogram interpretation, basket extraction of left impacted ureterovesical junction stone left double-J stent (6 [place] by 24 cm)  Surgeon:  Chauncey Cruel. Gaynelle Arabian, MD  First assistant:  None  Anesthesia:  General LMA  Preparation:  After appropriate preanesthesia, the patient was brought to the operative room, placed on the operating table in the dorsal supine position where general LMA anesthesia was introduced. She was replaced on the operating table in the dorsal lithotomy position where the pubis was prepped with Betadine solution and draped in usual fashion. Armband was double checked. History was double checked.  Review history:  57 yo diabetic bipolar female with 5 days of Left mid and low back pain, radiating to the left upper quadrant. Pain worsens with movement. Constipation x 4 days, despite stool softerner. No gross hematuria. She has had 3 previous kidney stones-passed spontaneously.She was seen at [facility], with CT showing left hydronephrosis and hydroureter extending down to a 77m x 560mleft UVJ stone. Normal Right ureter.  Gastric bypass 19 years ago. No stones since 2005.  Note neck surgery August 11 [place] pain managenment [facility] 2012 with chronic nack pain.  Tobacco: 40 pk yrs tobacco use. None sinc July.      Statement of  Likelihood of Success: Excellent. TIME-OUT observed.:  Procedure:  Cystourethroscopy was accomplished, the stone was identified, and the left ureter. Basket was placed around the stone, stone was completely extracted. The left orifice was edematous, and bleeding. Retrograde PyeloGram was performed, showing marked hydronephrosis and what side but no other stones identified. Guidewire was passed and coiled into the right left renal pelvis.  Ureteroscopy was, showing hydronephrosis, with smooth ureteral walls, and no evidence of any further stone. A 6 [place] by 24 cm double-J stent was placed without suture. The patient tolerated 0. She was given IV Toradol. She was awakened, taken to recovery room in good condition.

## 2012-11-21 NOTE — Anesthesia Preprocedure Evaluation (Addendum)
Anesthesia Evaluation  Patient identified by MRN, date of birth, ID band Patient awake  General Assessment Comment:.  Diabetes mellitus         urine protein-Cr ratio nl 09/2010, D.R. Screen neg 10/2009   .  Obesity         s/p bariatric surgery   .  Hepatic steatosis  2005       ultrasound   .  Bipolar disorder         questionable    Neck fusion 2 months ago by Dr. Carloyn Manner  .  Restless legs syndrome     .  Insomnia     .  Neurosis, anxiety, panic type     .  Chronic fatigue     .  Iron deficiency anemia  09/2009       malabsorbtion s/p bariatric surgery   .  Vitamin D deficiency  09/2009   .  Other B-complex deficiencies     .  Nephrolithiasis     .  Osteopenia  12/2010       Hip T score -2.0.  Spine T score -0.5 (plan to repeat in 2 yrs)   .  Colon cancer screening  03/06/2011       iFob 11/2010 through her insurer was negative.    .  Tobacco dependence     .  Chronic nausea  2012/13       Normal upper GI  12/2011 (mild GERD noted +small hiatal hernia)+   .  Chronic neck pain         cervical spondylosis on 2013 MRI     Reviewed: Allergy & Precautions, H&P , NPO status , Patient's Chart, lab work & pertinent test results  Airway Mallampati: II TM Distance: >3 FB Neck ROM: Limited   Comment: H/O neck pain. Dental  (+) Edentulous Upper and Edentulous Lower   Pulmonary former smoker,  CXR: mild left base atelectasis breath sounds clear to auscultation  Pulmonary exam normal       Cardiovascular Exercise Tolerance: Good negative cardio ROS  Rhythm:Regular Rate:Normal  ECG reviewed: ST 102, LAD,  ST and TWA, consider inferolateral ischemia.   Neuro/Psych PSYCHIATRIC DISORDERS Anxiety Depression Bipolar Disorder negative neurological ROS     GI/Hepatic Neg liver ROS, Chronic nausea on problem list.   Endo/Other  diabetes, Type 2, Oral Hypoglycemic Agents  Renal/GU Renal disease  negative genitourinary    Musculoskeletal negative musculoskeletal ROS (+)   Abdominal   Peds negative pediatric ROS (+)  Hematology negative hematology ROS (+)   Anesthesia Other Findings   Reproductive/Obstetrics negative OB ROS S/P hysterectomy                          Anesthesia Physical Anesthesia Plan  ASA: III  Anesthesia Plan: General   Post-op Pain Management:    Induction: Intravenous  Airway Management Planned: LMA  Additional Equipment:   Intra-op Plan:   Post-operative Plan: Extubation in OR  Informed Consent: I have reviewed the patients History and Physical, chart, labs and discussed the procedure including the risks, benefits and alternatives for the proposed anesthesia with the patient or authorized representative who has indicated his/her understanding and acceptance.   Dental advisory given  Plan Discussed with: CRNA  Anesthesia Plan Comments:         Anesthesia Quick Evaluation

## 2012-11-21 NOTE — Transfer of Care (Signed)
Immediate Anesthesia Transfer of Care Note  Patient: Diana [name]  Procedure(s) Performed: Procedure(s) (LRB): CYSTOSCOPY/RETROGRADE/URETEROSCOPY/STONE EXTRACTION WITH BASKET insertion double j stent (Left)  Patient Location: PACU  Anesthesia Type: General  Level of Consciousness: sedated, patient cooperative and responds to stimulation  Airway & Oxygen Therapy: Patient Spontanous Breathing and Patient connected to face mask oxgen  Post-op Assessment: Report given to PACU RN and Post -op Vital signs reviewed and stable  Post vital signs: Reviewed and stable  Complications: No apparent anesthesia complications

## 2012-11-22 ENCOUNTER — Encounter: Payer: Self-pay | Admitting: Family Medicine

## 2012-11-22 LAB — URINE CULTURE

## 2012-11-23 ENCOUNTER — Encounter (HOSPITAL_COMMUNITY): Payer: Self-pay | Admitting: Urology

## 2012-11-26 ENCOUNTER — Ambulatory Visit (INDEPENDENT_AMBULATORY_CARE_PROVIDER_SITE_OTHER): Payer: Medicare PPO | Admitting: Family Medicine

## 2012-11-26 ENCOUNTER — Encounter: Payer: Self-pay | Admitting: Family Medicine

## 2012-11-26 VITALS — BP 114/78 | HR 110 | Temp 98.6°F | Resp 18 | Ht 62.0 in | Wt 162.0 lb

## 2012-11-26 DIAGNOSIS — E119 Type 2 diabetes mellitus without complications: Secondary | ICD-10-CM

## 2012-11-26 DIAGNOSIS — M542 Cervicalgia: Secondary | ICD-10-CM

## 2012-11-26 DIAGNOSIS — F411 Generalized anxiety disorder: Secondary | ICD-10-CM

## 2012-11-26 DIAGNOSIS — F3189 Other bipolar disorder: Secondary | ICD-10-CM

## 2012-11-26 DIAGNOSIS — N200 Calculus of kidney: Secondary | ICD-10-CM | POA: Insufficient documentation

## 2012-11-26 DIAGNOSIS — G2581 Restless legs syndrome: Secondary | ICD-10-CM

## 2012-11-26 NOTE — Assessment & Plan Note (Signed)
Last week: left sided UVJ obstructing stone that caused hydronephrosis and mild elevation in Cr. She is doing better s/p extraction of stone, still has J stent in left ureter and has f/u with urologist tomorrow. She has had 2 urine cultures: both negative.  Serum creatinine returned to baseline the day of extraction.

## 2012-11-26 NOTE — Assessment & Plan Note (Signed)
She is doing ok off of the pramipexole for the last month or so.

## 2012-11-26 NOTE — Assessment & Plan Note (Signed)
Depressed phase, mainly situational--see HPI. She declines any different meds or any urgent psych referral at this time.  No SI or HI. She will continue to try to compromise with her boyfriend in order to try to work things out and stay in her home. Otherwise it appears she will have to move out of her home and go live with family in [place] or [place].

## 2012-11-26 NOTE — Assessment & Plan Note (Signed)
Chronic.  Ongoing despite recent neck fusion surgery. Seeing pain mgmt specialist, has narcotic contract signed with them. She says she is going to try to ween down off of some of her pain meds further (says she is already taking 2 oxycontin per day instead of the 3 per day that has been rx'd).

## 2012-11-26 NOTE — Progress Notes (Signed)
OFFICE NOTE  11/26/2012  CC:  Chief Complaint  Patient presents with  . Diabetes     HPI: Patient is a 57 y.o. Caucasian female who is here for 7d f/u left UVJ stone that had to be extracted via cystoureteroscopy by Dr. Gaynelle Arabian last week.  Left ureteral J stent was placed.  Her Cr was 1.4 prior to procedure, down to 1.1 after procedure. Remainder of acute labs/blood work was unremarkable.  She has no glucose numbers to report--too much emotional turmoil lately.  Still seeing pain specialist and she says she wants to ween off of some of her narcotics since she got c-spine fusion surgery. She gets monthly f/u with her pain specialist per her report today.  Her live-in boyfriend wants to have her move out of the house b/c she acts too sedated too much and they think she is on too much meds. She reports she is making attempts to ween back on her oxycontin and neurontin.  She also stopped her mirapex and says her RLS is not affecting her right now.  Denies pain, tingling, or numbness in feet.  Pertinent PMH:  Past Medical History  Diagnosis Date  . Diabetes mellitus     urine protein-Cr ratio nl 09/2010, D.R. Screen neg 10/2009  . Obesity     s/p bariatric surgery  . Hepatic steatosis 2005    ultrasound  . Bipolar disorder     questionable  . Restless legs syndrome   . Insomnia   . Neurosis, anxiety, panic type   . Chronic fatigue   . Iron deficiency anemia 09/2009    malabsorbtion s/p bariatric surgery  . Vitamin D deficiency 09/2009  . Other B-complex deficiencies   . Nephrolithiasis 11/2012  . Osteopenia 12/2010    Hip T score -2.0.  Spine T score -0.5 (plan to repeat in 2 yrs)  . Colon cancer screening 03/06/2011    iFob 11/2010 through her insurer was negative.   . Tobacco dependence   . Chronic nausea 2012/13    Normal upper GI  12/2011 (mild GERD noted +small hiatal hernia)+  . Chronic neck pain     cervical spondylosis on 2013 MRI   Past Surgical History   Procedure Laterality Date  . Roux-en-y gastric bypass    . Tonsillectomy    . Abdominal hysterectomy      fibroids, ovaries remain  . Bladder surgery      bladder tack  . Appendectomy    . Hernia repair  2005    mesenteric hernia repair, with lysis of adhesions (presented with SBO)  . Cholecystectomy  04/2010    w/lysis of adhesions (open procedure)--path showed chronic cholecystitis and cholesterol polyp.  Marland Kitchen Epidural block injection  03/2011    Cervical  . Cervical spine surgery  09/2009  . Cystoscopy/retrograde/ureteroscopy/stone extraction with basket  11/2012    Left UVJ stone  . Cystoscopy/retrograde/ureteroscopy/stone extraction with basket Left 11/21/2012    Procedure: CYSTOSCOPY/RETROGRADE/URETEROSCOPY/STONE EXTRACTION WITH BASKET insertion double j stent;  Surgeon: Ailene Rud, MD;  Location: [facility];  Service: Urology;  Laterality: Left;    MEDS:  Outpatient Prescriptions Prior to Visit  Medication Sig Dispense Refill  . Alum & Mag Hydroxide-Simeth (MAGIC MOUTHWASH W/LIDOCAINE) SOLN 1 tsp swish and spit qAC and qhs prn  120 mL  2  . bisacodyl (BISACODYL) 5 MG EC tablet Take 1 tablet (5 mg total) by mouth daily as needed for constipation.  30 tablet  0  . buPROPion (WELLBUTRIN XL)  150 MG 24 hr tablet TAKE 1 TABLET (150 MG TOTAL) BY MOUTH DAILY.  30 tablet  6  . calcium elemental as carbonate (TUMS ULTRA 1000) 400 MG tablet Chew 1,000 mg by mouth daily.        . Cholecalciferol (VITAMIN D3) 2000 UNITS capsule Take 1 capsule (2,000 Units total) by mouth daily.  30 capsule  2  . clonazePAM (KLONOPIN) 2 MG tablet TALE 1 TABLET BY MOUTH THREE TIMES A DAY AS NEEDED FOR ANXIETY  90 tablet  5  . cyanocobalamin (,VITAMIN B-12,) 1000 MCG/ML injection Inject 1 mL (1,000 mcg total) into the muscle every 14 (fourteen) days.  10 mL  3  . docusate sodium (COLACE) 100 MG capsule Take 1 capsule (100 mg total) by mouth 2 (two) times daily.  60 capsule  0  . gabapentin (NEURONTIN) 300 MG  capsule Take 900-1,200 mg by mouth daily. 900 mg at bedtime can take up to 1200 mg at bedtime      . metFORMIN (GLUCOPHAGE) 500 MG tablet Take 1 tablet (500 mg total) by mouth 2 (two) times daily with a meal.  180 tablet  3  . mirtazapine (REMERON) 45 MG tablet Take 1 tablet (45 mg total) by mouth at bedtime.  30 tablet  6  . oxycodone (OXYCONTIN) 30 MG TB12 Take 30 mg by mouth 3 (three) times daily.      . promethazine (PHENERGAN) 12.5 MG tablet Take 1-2 tablet every 6 hours as needed for nausea  60 tablet  2  . tiZANidine (ZANAFLEX) 4 MG tablet Take 1 tablet by mouth Three times a day.      . ondansetron (ZOFRAN-ODT) 8 MG disintegrating tablet Take 1 tablet (8 mg total) by mouth every 8 (eight) hours as needed for nausea.  30 tablet  2  . pramipexole (MIRAPEX) 0.125 MG tablet TAKE 4-5 TABLETS BY MOUTH AT BEDTIME  120 tablet  6  . zoster vaccine live, PF, (ZOSTAVAX) 52841 UNT/0.65ML injection Inject 19,400 Units into the skin once.  1 vial  0   No facility-administered medications prior to visit.    PE: Blood pressure 114/78, pulse 110, temperature 98.6 F (37 C), temperature source Temporal, resp. rate 18, height 5' 2"  (1.575 m), weight 162 lb (73.483 kg), SpO2 96.00%. Gen: alert, oriented x 4, appears pale and affect is flat.  Often lies her head on the exam table, rarely makes eye contact. Foot exam - both normal; no swelling, tenderness or skin or vascular lesions. Color and temperature is normal. Sensation is intact. Peripheral pulses are palpable. Toenails are normal.   IMPRESSION AND PLAN:  Type II or unspecified type diabetes mellitus without mention of complication, not stated as uncontrolled HbA1c today. Encouraged her to restart at least occasional home glucose monitoring. Feet exam normal today. Reminded pt that she is overdue for annual screening diab retinopathy exam.  RESTLESS LEG SYNDROME She is doing ok off of the pramipexole for the last month or so.  ANXIETY STATE,  UNSPECIFIED Clonazepam rx'd by me--scheduled. Pt states that this is the "one med that is keeping me from going off the deep end". UDS today.  She signed a controlled substance contract when she was in the office last week.  BIPOLAR II DISORDER Depressed phase, mainly situational--see HPI. She declines any different meds or any urgent psych referral at this time.  No SI or HI. She will continue to try to compromise with her boyfriend in order to try to work things out  and stay in her home. Otherwise it appears she will have to move out of her home and go live with family in [place] or [place].  Cervical pain Chronic.  Ongoing despite recent neck fusion surgery. Seeing pain mgmt specialist, has narcotic contract signed with them. She says she is going to try to ween down off of some of her pain meds further (says she is already taking 2 oxycontin per day instead of the 3 per day that has been rx'd).  Recurrent nephrolithiasis Last week: left sided UVJ obstructing stone that caused hydronephrosis and mild elevation in Cr. She is doing better s/p extraction of stone, still has J stent in left ureter and has f/u with urologist tomorrow. She has had 2 urine cultures: both negative.  Serum creatinine returned to baseline the day of extraction.   An After Visit Summary was printed and given to the patient.  FOLLOW UP: 4 mo

## 2012-11-26 NOTE — Assessment & Plan Note (Signed)
Clonazepam rx'd by me--scheduled. Pt states that this is the "one med that is keeping me from going off the deep end". UDS today.  She signed a controlled substance contract when she was in the office last week.

## 2012-11-26 NOTE — Assessment & Plan Note (Signed)
HbA1c today. Encouraged her to restart at least occasional home glucose monitoring. Feet exam normal today. Reminded pt that she is overdue for annual screening diab retinopathy exam.

## 2012-12-28 ENCOUNTER — Encounter: Payer: Self-pay | Admitting: Family Medicine

## 2013-01-11 ENCOUNTER — Encounter: Payer: Self-pay | Admitting: Family Medicine

## 2013-03-03 ENCOUNTER — Other Ambulatory Visit: Payer: Self-pay | Admitting: Family Medicine

## 2013-03-03 MED ORDER — MIRTAZAPINE 45 MG PO TABS: 45.0000 mg | ORAL_TABLET | Freq: Every day | ORAL | Status: DC

## 2013-03-12 ENCOUNTER — Ambulatory Visit: Payer: Medicare PPO | Admitting: Family Medicine

## 2013-03-19 ENCOUNTER — Telehealth: Payer: Self-pay | Admitting: Family Medicine

## 2013-03-19 MED ORDER — CLONAZEPAM 2 MG PO TABS: ORAL_TABLET | ORAL | Status: DC

## 2013-03-19 NOTE — Telephone Encounter (Signed)
Patient requesting refill of klonopin.  Patient last seen 11/26/12.  Last rx was 08/28/12 x 5 refills.  Please advise refill.

## 2013-03-19 NOTE — Telephone Encounter (Signed)
Patient needs a refill on her Klonopin, she is completely out

## 2013-03-19 NOTE — Telephone Encounter (Signed)
Rx printed

## 2013-03-24 ENCOUNTER — Ambulatory Visit: Payer: Medicare PPO | Admitting: Family Medicine

## 2013-03-25 ENCOUNTER — Telehealth: Payer: Self-pay | Admitting: Family Medicine

## 2013-03-25 NOTE — Telephone Encounter (Signed)
Need to advise patient that I have sent in her referral to [facility] for Dr. Isabelle Course office per [facility] requirement. Her next appt with Dr. Francesco Runner has been scheduled for 05/17/13 at 11:30.

## 2013-03-29 ENCOUNTER — Ambulatory Visit: Payer: Medicare PPO | Admitting: Family Medicine

## 2013-03-29 NOTE — Telephone Encounter (Signed)
Patient has been advised

## 2013-04-01 ENCOUNTER — Ambulatory Visit: Payer: Medicare PPO | Admitting: Family Medicine

## 2013-05-05 ENCOUNTER — Ambulatory Visit (INDEPENDENT_AMBULATORY_CARE_PROVIDER_SITE_OTHER): Payer: Medicare PPO | Admitting: Family Medicine

## 2013-05-05 ENCOUNTER — Encounter: Payer: Self-pay | Admitting: Family Medicine

## 2013-05-05 VITALS — BP 113/77 | HR 128 | Temp 97.9°F | Resp 18 | Ht 62.0 in | Wt 161.0 lb

## 2013-05-05 DIAGNOSIS — F39 Unspecified mood [affective] disorder: Secondary | ICD-10-CM

## 2013-05-05 DIAGNOSIS — IMO0001 Reserved for inherently not codable concepts without codable children: Secondary | ICD-10-CM

## 2013-05-05 DIAGNOSIS — E1165 Type 2 diabetes mellitus with hyperglycemia: Principal | ICD-10-CM

## 2013-05-05 DIAGNOSIS — J449 Chronic obstructive pulmonary disease, unspecified: Secondary | ICD-10-CM

## 2013-05-05 DIAGNOSIS — J209 Acute bronchitis, unspecified: Secondary | ICD-10-CM

## 2013-05-05 LAB — BASIC METABOLIC PANEL
BUN: 9 mg/dL (ref 6–23)
CO2: 26 mEq/L (ref 19–32)
Calcium: 9.5 mg/dL (ref 8.4–10.5)
Chloride: 95 mEq/L — ABNORMAL LOW (ref 96–112)
Creatinine, Ser: 1.2 mg/dL (ref 0.4–1.2)
GFR: 48.4 mL/min — AB (ref >60.00)
Glucose, Bld: 467 mg/dL — ABNORMAL HIGH (ref 70–99)
POTASSIUM: 4.1 meq/L (ref 3.5–5.1)
SODIUM: 132 meq/L — AB (ref 135–145)

## 2013-05-05 LAB — HEMOGLOBIN A1C: HEMOGLOBIN A1C: 12.8 % — AB (ref 4.6–6.5)

## 2013-05-05 MED ORDER — ALBUTEROL SULFATE HFA 108 (90 BASE) MCG/ACT IN AERS: 2.0000 | INHALATION_SPRAY | Freq: Four times a day (QID) | RESPIRATORY_TRACT | Status: DC | PRN

## 2013-05-05 NOTE — Progress Notes (Signed)
Pre visit review using our clinic review tool, if applicable. No additional management support is needed unless otherwise documented below in the visit note. 
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
## 2013-05-05 NOTE — Patient Instructions (Signed)
Take your bupropion (wellbutrin XL) every other day for 3 doses, then stop this med completely.

## 2013-05-06 ENCOUNTER — Telehealth: Payer: Self-pay | Admitting: Family Medicine

## 2013-05-06 ENCOUNTER — Encounter: Payer: Self-pay | Admitting: Family Medicine

## 2013-05-06 NOTE — Telephone Encounter (Signed)
Relevant patient education assigned to patient using Emmi. ° °

## 2013-05-11 ENCOUNTER — Other Ambulatory Visit: Payer: Self-pay | Admitting: Family Medicine

## 2013-05-11 NOTE — Telephone Encounter (Signed)
Refill request for gabapentin.  I don't see that you gave this medication to patient.  Please advise refill.

## 2013-05-19 ENCOUNTER — Encounter: Payer: Self-pay | Admitting: Family Medicine

## 2013-05-19 ENCOUNTER — Ambulatory Visit (INDEPENDENT_AMBULATORY_CARE_PROVIDER_SITE_OTHER): Payer: Commercial Managed Care - HMO | Admitting: Family Medicine

## 2013-05-19 VITALS — BP 115/82 | HR 114 | Temp 98.4°F | Resp 18 | Ht 62.0 in | Wt 163.0 lb

## 2013-05-19 DIAGNOSIS — IMO0001 Reserved for inherently not codable concepts without codable children: Secondary | ICD-10-CM | POA: Diagnosis not present

## 2013-05-19 DIAGNOSIS — E1165 Type 2 diabetes mellitus with hyperglycemia: Secondary | ICD-10-CM

## 2013-05-19 DIAGNOSIS — F3189 Other bipolar disorder: Secondary | ICD-10-CM

## 2013-05-19 MED ORDER — LAMOTRIGINE 25 MG PO TBDP: ORAL_TABLET | ORAL | Status: DC

## 2013-05-19 NOTE — Assessment & Plan Note (Addendum)
Has restarted basal + mealtime insulin regimen: 10 U levemir qhs and 5 U novolog with each meal.  Gave titration instructions: Check fasting glucose every morning.  If not at goal range of 100-110 then increase your levimir dose by 1 unit every night until you are consistently getting into 100-110 range when fasting.  Check glucose before each meal: give 5 units of novolog at every meal. If pre-meal glucose is 100-150, give no additional units of novolog. If pre meal glucose is 151-200, give 2 additional units of novolog. If 201-250, give 4 additional units. If 251-300, give 6 additional units. If >300, give 10 additional units of novolog.

## 2013-05-19 NOTE — Patient Instructions (Signed)
Check fasting glucose every morning.  If not at goal range of 100-110 then increase your levimir dose by 1 unit every night until you are consistently getting into 100-110 range when fasting.  Check glucose before each meal: give 5 units of novolog at every meal. If pre-meal glucose is 100-150, give no additional units of novolog. If pre meal glucose is 151-200, give 2 additional units of novolog. If 201-250, give 4 additional units. If 251-300, give 6 additional units. If >300, give 10 additional units of novolog.

## 2013-05-19 NOTE — Progress Notes (Signed)
OFFICE NOTE  05/19/2013  CC:  Chief Complaint  Patient presents with  . Diabetes     HPI: Patient is a 58 y.o. Caucasian female who is here for 2 wk follow up DM 2 and mood disorder. Last visit I d/c'd her wellbutrin.  A1c was >12% and I recommended she restart her lantus at 10 U qhs and novolog at 5 U sq qAC.  Not monitoring glucoses b/c out of strips, needs refill.  Stress playing a role, dietary noncompliance playing a role. Overall psychologically and emotionally she is more prone to cry and having mood swings. She is ready to try a mood stabilizer again.  She recalls lamictal has helped in the past with mood swings and poor energy level.  Pertinent PMH:  Past medical, surgical, social hx reviewed.  MEDS:  Outpatient Prescriptions Prior to Visit  Medication Sig Dispense Refill  . albuterol (VENTOLIN HFA) 108 (90 BASE) MCG/ACT inhaler Inhale 2 puffs into the lungs every 6 (six) hours as needed for wheezing or shortness of breath.  1 Inhaler  1  . Alum & Mag Hydroxide-Simeth (MAGIC MOUTHWASH W/LIDOCAINE) SOLN 1 tsp swish and spit qAC and qhs prn  120 mL  2  . bisacodyl (BISACODYL) 5 MG EC tablet Take 1 tablet (5 mg total) by mouth daily as needed for constipation.  30 tablet  0  . calcium elemental as carbonate (TUMS ULTRA 1000) 400 MG tablet Chew 1,000 mg by mouth daily.        . Cholecalciferol (VITAMIN D3) 2000 UNITS capsule Take 1 capsule (2,000 Units total) by mouth daily.  30 capsule  2  . clonazePAM (KLONOPIN) 2 MG tablet TALE 1 TABLET BY MOUTH THREE TIMES A DAY AS NEEDED FOR ANXIETY  90 tablet  5  . cyanocobalamin (,VITAMIN B-12,) 1000 MCG/ML injection Inject 1 mL (1,000 mcg total) into the muscle every 14 (fourteen) days.  10 mL  3  . docusate sodium (COLACE) 100 MG capsule Take 1 capsule (100 mg total) by mouth 2 (two) times daily.  60 capsule  0  . DULoxetine (CYMBALTA) 30 MG capsule       . gabapentin (NEURONTIN) 300 MG capsule Take 900-1,200 mg by mouth daily. 900 mg at  bedtime can take up to 1200 mg at bedtime      . metFORMIN (GLUCOPHAGE) 500 MG tablet Take 1,000 mg by mouth 2 (two) times daily with a meal.      . mirtazapine (REMERON) 45 MG tablet Take 1 tablet (45 mg total) by mouth at bedtime.  30 tablet  3  . oxycodone (OXYCONTIN) 30 MG TB12 Take 30 mg by mouth 3 (three) times daily.      . pramipexole (MIRAPEX) 0.125 MG tablet       . tiZANidine (ZANAFLEX) 4 MG tablet Take 1 tablet by mouth Three times a day.      . gabapentin (NEURONTIN) 300 MG capsule TAKE 1-4 CAPSULES BY MOUTH AT BEDTIME  120 capsule  5   No facility-administered medications prior to visit.    PE: Blood pressure 115/82, pulse 114, temperature 98.4 F (36.9 C), temperature source Temporal, resp. rate 18, height 5' 2"  (1.575 m), weight 163 lb (73.936 kg), SpO2 96.00%. Gen: Alert, well appearing.  Patient is oriented to person, place, time, and situation. Pale appearing as per her usual. Affect is pleasant but occ she tears up and has to stop herself from crying. Displays lucid thought and speech and has normal judgement. No further  exam today.  IMPRESSION AND PLAN:  BIPOLAR II DISORDER Start lamictal 44m qhs x 15d, then increase to 569mqhs. I'll see her back in 4 wks and at that point we'll be able to increase dosing to 10060mhs (new rx for 100 mg tabs at that time).  Therapeutic expectations and side effect profile of medication discussed today.  Patient's questions answered.   Type II or unspecified type diabetes mellitus without mention of complication, uncontrolled Has restarted basal + mealtime insulin regimen: 10 U levemir qhs and 5 U novolog with each meal.  Gave titration instructions: Check fasting glucose every morning.  If not at goal range of 100-110 then increase your levimir dose by 1 unit every night until you are consistently getting into 100-110 range when fasting.  Check glucose before each meal: give 5 units of novolog at every meal. If pre-meal glucose  is 100-150, give no additional units of novolog. If pre meal glucose is 151-200, give 2 additional units of novolog. If 201-250, give 4 additional units. If 251-300, give 6 additional units. If >300, give 10 additional units of novolog.    An After Visit Summary was printed and given to the patient.  FOLLOW UP: 4 wks

## 2013-05-19 NOTE — Progress Notes (Signed)
Pre visit review using our clinic review tool, if applicable. No additional management support is needed unless otherwise documented below in the visit note. 

## 2013-05-19 NOTE — Assessment & Plan Note (Signed)
Start lamictal 75m qhs x 15d, then increase to 525mqhs. I'll see her back in 4 wks and at that point we'll be able to increase dosing to 10029mhs (new rx for 100 mg tabs at that time).  Therapeutic expectations and side effect profile of medication discussed today.  Patient's questions answered.

## 2013-05-25 ENCOUNTER — Telehealth: Payer: Self-pay

## 2013-05-25 NOTE — Telephone Encounter (Signed)
Relevant patient education assigned to patient using Emmi. ° °

## 2013-05-26 ENCOUNTER — Other Ambulatory Visit: Payer: Self-pay | Admitting: Family Medicine

## 2013-05-26 MED ORDER — ONETOUCH LANCETS MISC: Status: DC

## 2013-05-26 MED ORDER — GLUCOSE BLOOD VI STRP: ORAL_STRIP | Status: DC

## 2013-05-26 NOTE — Telephone Encounter (Signed)
Patient requesting lancets and test strips for glucometer.  One Touch Ultra MINI.  Sent to [facility] per patient request.

## 2013-06-01 ENCOUNTER — Telehealth: Payer: Self-pay

## 2013-06-01 NOTE — Telephone Encounter (Signed)
Relevant patient education assigned to patient using Emmi. ° °

## 2013-06-09 ENCOUNTER — Encounter: Payer: Self-pay | Admitting: Family Medicine

## 2013-06-16 ENCOUNTER — Ambulatory Visit (INDEPENDENT_AMBULATORY_CARE_PROVIDER_SITE_OTHER): Payer: Medicare PPO | Admitting: Family Medicine

## 2013-06-16 ENCOUNTER — Encounter: Payer: Self-pay | Admitting: Family Medicine

## 2013-06-16 VITALS — BP 118/82 | HR 124 | Temp 97.8°F | Resp 18 | Ht 62.0 in | Wt 169.0 lb

## 2013-06-16 DIAGNOSIS — IMO0001 Reserved for inherently not codable concepts without codable children: Secondary | ICD-10-CM

## 2013-06-16 DIAGNOSIS — N183 Chronic kidney disease, stage 3 unspecified: Secondary | ICD-10-CM

## 2013-06-16 DIAGNOSIS — R000 Tachycardia, unspecified: Secondary | ICD-10-CM | POA: Insufficient documentation

## 2013-06-16 DIAGNOSIS — E1165 Type 2 diabetes mellitus with hyperglycemia: Principal | ICD-10-CM

## 2013-06-16 DIAGNOSIS — Z79899 Other long term (current) drug therapy: Secondary | ICD-10-CM

## 2013-06-16 DIAGNOSIS — Z23 Encounter for immunization: Secondary | ICD-10-CM

## 2013-06-16 DIAGNOSIS — R231 Pallor: Secondary | ICD-10-CM

## 2013-06-16 MED ORDER — LAMOTRIGINE 100 MG PO TABS: 100.0000 mg | ORAL_TABLET | Freq: Every day | ORAL | Status: DC

## 2013-06-16 MED ORDER — MIRTAZAPINE 30 MG PO TABS: 30.0000 mg | ORAL_TABLET | Freq: Every day | ORAL | Status: DC

## 2013-06-16 MED ORDER — AMPHETAMINE-DEXTROAMPHETAMINE 10 MG PO TABS: ORAL_TABLET | ORAL | Status: DC

## 2013-06-16 NOTE — Patient Instructions (Signed)
Throw away your 45 mg remeron tabs.  Start 63m remeron--I sent this rx to your pharmacy today.  Start lamictal 1031monce daily when you finish your two weeks of 5030maily dosing.  Increase insulin as per instruction sheet.

## 2013-06-16 NOTE — Progress Notes (Signed)
Pre visit review using our clinic review tool, if applicable. No additional management support is needed unless otherwise documented below in the visit note. 

## 2013-06-16 NOTE — Progress Notes (Signed)
OFFICE NOTE  06/16/2013  CC:  Chief Complaint  Patient presents with  . Follow-up     HPI: Patient is a 58 y.o. Caucasian female who is here for 1 mo f/u poorly controlled DM and mood disorder. Started lamictal last visit.    Sugars still up, admits to stress eating, working on getting eating times/habits. Fasting gluc's: 250 range  Is on 12 units of levemir qhs now: she is hesitant at titrating. Novolog avg dose is 10 units (typical premeal glucose is 300).  She will start taking 2 of the 2m lamictal next week.  Says it hasn't helped yet.   Still under a lot of stress, not knowing whether she'll be asked to leave her home at any moment. Having nightmares/terrors.  Has poor energy level, hx of poor focus, forgetfulness.  Says she was on adderall in the past 10-220mbid from a prev psych provider (Dr. SiMoshe Cipro  Pertinent PMH:  Past medical, surgical, social, and family history reviewed and no changes are noted since last office visit.  MEDS: Also taking levemir and novolog Outpatient Prescriptions Prior to Visit  Medication Sig Dispense Refill  . albuterol (VENTOLIN HFA) 108 (90 BASE) MCG/ACT inhaler Inhale 2 puffs into the lungs every 6 (six) hours as needed for wheezing or shortness of breath.  1 Inhaler  1  . Alum & Mag Hydroxide-Simeth (MAGIC MOUTHWASH W/LIDOCAINE) SOLN 1 tsp swish and spit qAC and qhs prn  120 mL  2  . bisacodyl (BISACODYL) 5 MG EC tablet Take 1 tablet (5 mg total) by mouth daily as needed for constipation.  30 tablet  0  . calcium elemental as carbonate (TUMS ULTRA 1000) 400 MG tablet Chew 1,000 mg by mouth daily.        . Cholecalciferol (VITAMIN D3) 2000 UNITS capsule Take 1 capsule (2,000 Units total) by mouth daily.  30 capsule  2  . clonazePAM (KLONOPIN) 2 MG tablet TALE 1 TABLET BY MOUTH THREE TIMES A DAY AS NEEDED FOR ANXIETY  90 tablet  5  . cyanocobalamin (,VITAMIN B-12,) 1000 MCG/ML injection Inject 1 mL (1,000 mcg total) into the muscle every 14  (fourteen) days.  10 mL  3  . docusate sodium (COLACE) 100 MG capsule Take 1 capsule (100 mg total) by mouth 2 (two) times daily.  60 capsule  0  . DULoxetine (CYMBALTA) 30 MG capsule       . gabapentin (NEURONTIN) 300 MG capsule Take 900-1,200 mg by mouth daily. 900 mg at bedtime can take up to 1200 mg at bedtime      . glucose blood (ONE TOUCH ULTRA TEST) test strip Use as instructed  100 each  3  . lamotrigine (LAMICTAL) 25 MG disintegrating tablet 1 tab po qhs x 15d, then increase to 2 tabs po qhs  45 tablet  0  . metFORMIN (GLUCOPHAGE) 500 MG tablet Take 1,000 mg by mouth 2 (two) times daily with a meal.      . mirtazapine (REMERON) 45 MG tablet Take 1 tablet (45 mg total) by mouth at bedtime.  30 tablet  3  . ONE TOUCH LANCETS MISC Use as directed to check Glucose  100 each  3  . oxycodone (OXYCONTIN) 30 MG TB12 Take 30 mg by mouth 3 (three) times daily.      . pramipexole (MIRAPEX) 0.125 MG tablet       . tiZANidine (ZANAFLEX) 4 MG tablet Take 1 tablet by mouth Three times a day.  No facility-administered medications prior to visit.    PE: Blood pressure 118/82, pulse 124, temperature 97.8 F (36.6 C), temperature source Oral, resp. rate 18, height 5' 2"  (1.575 m), weight 169 lb (76.658 kg), SpO2 92.00%. Gen: Alert, well appearing.  Patient is oriented to person, place, time, and situation. Pallor noted, as per her usual skin color. CV: Regular, tachycardic, no murmur/rub/gallop LUNGS: CTA bilat, nonlabored resps   IMPRESSION AND PLAN:  1) Bipolar disorder: titrating lamictal, next dose increase will be to 49m qhs x 2 wks, then 1041mqhs. I'm going to start her on adderall short acting, 1043mid, as augmenting agent for her depression with chronic poor motivation and fatigue. Will start to ween her off remeron: changed to 60m61ms today.  2) DM 2, poorly controlled, pt hesitant to up-titrate insulin.  Encouraged her to get very serious about this.  I printed out her  mealtime insulin sliding scale instructions again today for her. Urine microalb/cr today.  3) Tachycardia: likely some autonomic dysregulation assoc with poorly controlled DM. However, given pallor and fatigue will check CBC. Also, check TSH.  4) Preventative health care: Tdap IM today.  Spent 25 min with pt today, with >50% of this time spent in counseling and care coordination regarding the above problems.  FOLLOW UP: 1 mo

## 2013-06-17 LAB — CBC WITH DIFFERENTIAL/PLATELET
BASOS ABS: 0 10*3/uL (ref 0.0–0.1)
Basophils Relative: 0.2 % (ref 0.0–3.0)
EOS ABS: 0.3 10*3/uL (ref 0.0–0.7)
Eosinophils Relative: 3.8 % (ref 0.0–5.0)
HCT: 39.5 % (ref 36.0–46.0)
HEMOGLOBIN: 12.9 g/dL (ref 12.0–15.0)
LYMPHS PCT: 38.9 % (ref 12.0–46.0)
Lymphs Abs: 3.3 10*3/uL (ref 0.7–4.0)
MCHC: 32.6 g/dL (ref 30.0–36.0)
MCV: 89.6 fl (ref 78.0–100.0)
MONOS PCT: 5.1 % (ref 3.0–12.0)
Monocytes Absolute: 0.4 10*3/uL (ref 0.1–1.0)
NEUTROS ABS: 4.4 10*3/uL (ref 1.4–7.7)
NEUTROS PCT: 52 % (ref 43.0–77.0)
PLATELETS: 281 10*3/uL (ref 150.0–400.0)
RBC: 4.41 Mil/uL (ref 3.87–5.11)
RDW: 14.6 % (ref 11.5–15.5)
WBC: 8.5 10*3/uL (ref 4.0–10.5)

## 2013-06-17 LAB — COMPREHENSIVE METABOLIC PANEL
ALT: 19 U/L (ref 0–35)
AST: 43 U/L — ABNORMAL HIGH (ref 0–37)
Albumin: 4.1 g/dL (ref 3.5–5.2)
Alkaline Phosphatase: 97 U/L (ref 39–117)
BILIRUBIN TOTAL: 0.3 mg/dL (ref 0.2–1.2)
BUN: 11 mg/dL (ref 6–23)
CALCIUM: 9.3 mg/dL (ref 8.4–10.5)
CHLORIDE: 98 meq/L (ref 96–112)
CO2: 26 mEq/L (ref 19–32)
Creatinine, Ser: 1.1 mg/dL (ref 0.4–1.2)
GFR: 57.53 mL/min — ABNORMAL LOW (ref >60.00)
Glucose, Bld: 322 mg/dL — ABNORMAL HIGH (ref 70–99)
Potassium: 4.3 mEq/L (ref 3.5–5.1)
SODIUM: 135 meq/L (ref 135–145)
TOTAL PROTEIN: 8.1 g/dL (ref 6.0–8.3)

## 2013-06-17 LAB — TSH: TSH: 2.46 u[IU]/mL (ref 0.35–4.50)

## 2013-06-18 LAB — MICROALBUMIN / CREATININE URINE RATIO
CREATININE, U: 87.6 mg/dL
Microalb Creat Ratio: 0.9 mg/g (ref 0.0–30.0)
Microalb, Ur: 0.8 mg/dL (ref 0.0–1.9)

## 2013-06-24 ENCOUNTER — Other Ambulatory Visit: Payer: Self-pay | Admitting: Family Medicine

## 2013-07-09 ENCOUNTER — Other Ambulatory Visit: Payer: Self-pay | Admitting: Family Medicine

## 2013-07-15 ENCOUNTER — Ambulatory Visit: Payer: Medicare PPO | Admitting: Family Medicine

## 2013-08-09 ENCOUNTER — Telehealth: Payer: Self-pay | Admitting: Family Medicine

## 2013-08-09 MED ORDER — MECLIZINE HCL 25 MG PO TABS: 25.0000 mg | ORAL_TABLET | Freq: Three times a day (TID) | ORAL | Status: DC | PRN

## 2013-08-09 NOTE — Telephone Encounter (Signed)
Pt LMOM stating she was experiencing vertigo and she has had it before and wanted to see if she could get an RX for this until her next appointment 08/18/13.  Please advise.

## 2013-08-09 NOTE — Telephone Encounter (Signed)
Rx for meclizine sent to [facility].

## 2013-08-09 NOTE — Telephone Encounter (Signed)
Patient aware.

## 2013-08-10 ENCOUNTER — Other Ambulatory Visit: Payer: Self-pay | Admitting: Family Medicine

## 2013-08-18 ENCOUNTER — Ambulatory Visit: Payer: Medicare PPO | Admitting: Family Medicine

## 2013-08-21 ENCOUNTER — Other Ambulatory Visit: Payer: Self-pay | Admitting: Family Medicine

## 2013-08-26 ENCOUNTER — Encounter: Payer: Self-pay | Admitting: Family Medicine

## 2013-08-26 ENCOUNTER — Ambulatory Visit (INDEPENDENT_AMBULATORY_CARE_PROVIDER_SITE_OTHER): Payer: Medicare PPO | Admitting: Family Medicine

## 2013-08-26 VITALS — BP 125/76 | HR 119 | Temp 97.8°F | Resp 18 | Ht 62.0 in | Wt 161.0 lb

## 2013-08-26 DIAGNOSIS — H8110 Benign paroxysmal vertigo, unspecified ear: Secondary | ICD-10-CM

## 2013-08-26 DIAGNOSIS — E1165 Type 2 diabetes mellitus with hyperglycemia: Secondary | ICD-10-CM

## 2013-08-26 DIAGNOSIS — IMO0001 Reserved for inherently not codable concepts without codable children: Secondary | ICD-10-CM

## 2013-08-26 DIAGNOSIS — F3189 Other bipolar disorder: Secondary | ICD-10-CM

## 2013-08-26 DIAGNOSIS — Z79899 Other long term (current) drug therapy: Secondary | ICD-10-CM | POA: Insufficient documentation

## 2013-08-26 DIAGNOSIS — H8113 Benign paroxysmal vertigo, bilateral: Secondary | ICD-10-CM

## 2013-08-26 DIAGNOSIS — F411 Generalized anxiety disorder: Secondary | ICD-10-CM

## 2013-08-26 MED ORDER — INSULIN PEN NEEDLE 31G X 6 MM MISC: Status: DC

## 2013-08-26 NOTE — Assessment & Plan Note (Addendum)
Discussed importance of home Epley maneuvers, reviewed these with her, gave pt handout to take home. I had to insist that she not drive home today so I called her husband and he agreed to come pick her up today to take her home.

## 2013-08-26 NOTE — Assessment & Plan Note (Signed)
Fairly well controlled: no changes in meds at this time, esp in light of her current vertigo problems.

## 2013-08-26 NOTE — Progress Notes (Signed)
Pre visit review using our clinic review tool, if applicable. No additional management support is needed unless otherwise documented below in the visit note. 

## 2013-08-26 NOTE — Assessment & Plan Note (Signed)
Will make every effort to minimize meds--esp psychotropic ones--despite her difficult to control psychiatric and pain issues. Ongoing pain mgmt is done via Dr. Francesco Runner.

## 2013-08-26 NOTE — Assessment & Plan Note (Signed)
Need to recheck A1c today but she feels so bad with her vertigo that I will put this off and do this at f/u in 2 wks.

## 2013-08-26 NOTE — Progress Notes (Addendum)
OFFICE VISIT  08/26/2013  CC:  Chief Complaint  Patient presents with  . Follow-up  . Dizziness   HPI:    Patient is a 58 y.o. Caucasian female who presents for 2 mo f/u DM 2 and bipolar disorder and chronic fatigue. Also recently started having a recurrence of her vertigo.  DM 2: Now on 8 U mealtime insulin usually and 18 U levemir qhs.  Has not been monitoring glucoses.  Says she's taking lamictal 83m bid--hasn't gone up to 100 mg qhs yet.  Says she finds it hard to tell if this has helped with mood stability b/c lately she's been so irritable secondary to recurrent vertigo. Felt improved focus, energy, motivation on adderall but was on this only 1 mo--she missed f/u appt when we were going to prescribe this again.  Has been off of remeron for 1 mo now after weening down to 359mfor 1 mo.  Wants to restart it b/c she says she has trouble sleeping again but I said we need to work on minimizing her psych meds and we were going to NOT restart this one.  Her pain/spine MD, Dr. O'Francesco Runnerrx'd her cymbalta 3056md about 2 mo ago and she has f/u with him 10/2013. She says she doesn't like the med but is unable to specify why. She takes 1200 mg neurontin qhs as well as 4mg78mnaflex tid and is trying to ween down off of her oxycodone some.  For the last 3 wks, she feels recurrent episodes of a spinning sensation, nausea, triggered by getting up and down/head movements right or left.   Reports long hx of vertigo episodes in the past.  Began feeling a bit better yesterday evening.  She feels like the meclizine is helping a little but I rx'd this over 2 wks ago. She kept saying she felt trouble driving here b/c of dizziness/nausea.   Past Medical History  Diagnosis Date  . Diabetes mellitus     urine protein-Cr ratio nl 09/2010, D.R. Screen neg 10/2009  . Obesity     s/p bariatric surgery  . Hepatic steatosis 2005    ultrasound  . Bipolar disorder     questionable  . Restless legs syndrome    . Insomnia   . Neurosis, anxiety, panic type   . Chronic fatigue   . Iron deficiency anemia 09/2009    malabsorbtion s/p bariatric surgery  . Vitamin D deficiency 09/2009  . Other B-complex deficiencies   . Nephrolithiasis 11/2012  . Osteopenia 12/2010    Hip T score -2.0.  Spine T score -0.5 (plan to repeat in 2 yrs)  . Colon cancer screening 03/06/2011    iFob 11/2010 through her insurer was negative.   . Tobacco dependence   . Chronic nausea 2012/13    Normal upper GI  12/2011 (mild GERD noted +small hiatal hernia)+  . Chronic neck pain     cervical spondylosis on 2013 MRI  . Chronic renal insufficiency, stage III (moderate)     CrCl @50ml /min    Past Surgical History  Procedure Laterality Date  . Roux-en-y gastric bypass    . Tonsillectomy    . Abdominal hysterectomy      fibroids, ovaries remain  . Bladder surgery      bladder tack  . Appendectomy    . Hernia repair  2005    mesenteric hernia repair, with lysis of adhesions (presented with SBO)  . Cholecystectomy  04/2010    w/lysis of adhesions (open  procedure)--path showed chronic cholecystitis and cholesterol polyp.  Marland Kitchen Epidural block injection  03/2011    Cervical  . Cervical spine surgery  09/2009  . Cystoscopy/retrograde/ureteroscopy/stone extraction with basket  11/2012    Left UVJ stone  . Cystoscopy/retrograde/ureteroscopy/stone extraction with basket Left 11/21/2012    Procedure: CYSTOSCOPY/RETROGRADE/URETEROSCOPY/STONE EXTRACTION WITH BASKET insertion double j stent;  Surgeon: Ailene Rud, MD;  Location: [facility];  Service: Urology;  Laterality: Left;    Outpatient Prescriptions Prior to Visit  Medication Sig Dispense Refill  . albuterol (VENTOLIN HFA) 108 (90 BASE) MCG/ACT inhaler Inhale 2 puffs into the lungs every 6 (six) hours as needed for wheezing or shortness of breath.  1 Inhaler  1  . Alum & Mag Hydroxide-Simeth (MAGIC MOUTHWASH W/LIDOCAINE) SOLN 1 tsp swish and spit qAC and qhs prn  120 mL  2   . amphetamine-dextroamphetamine (ADDERALL) 10 MG tablet 1 tab po bid  60 tablet  0  . bisacodyl (BISACODYL) 5 MG EC tablet Take 1 tablet (5 mg total) by mouth daily as needed for constipation.  30 tablet  0  . calcium elemental as carbonate (TUMS ULTRA 1000) 400 MG tablet Chew 1,000 mg by mouth daily.        . clonazePAM (KLONOPIN) 2 MG tablet TALE 1 TABLET BY MOUTH THREE TIMES A DAY AS NEEDED FOR ANXIETY  90 tablet  5  . [employer]S VITAMIN D 2000 UNITS CAPS TAKE 1 CAPSULE (2,000 UNITS TOTAL) BY MOUTH DAILY.  30 capsule  2  . cyanocobalamin (,VITAMIN B-12,) 1000 MCG/ML injection Inject 1 mL (1,000 mcg total) into the muscle every 14 (fourteen) days.  10 mL  3  . docusate sodium (COLACE) 100 MG capsule Take 1 capsule (100 mg total) by mouth 2 (two) times daily.  60 capsule  0  . DULoxetine (CYMBALTA) 30 MG capsule       . gabapentin (NEURONTIN) 300 MG capsule Take 900-1,200 mg by mouth daily. 900 mg at bedtime can take up to 1200 mg at bedtime      . glucose blood (ONE TOUCH ULTRA TEST) test strip Use as instructed  100 each  3  . insulin aspart (NOVOLOG FLEXPEN) 100 UNIT/ML FlexPen Inject 5 Units into the skin 3 (three) times daily with meals.      . Insulin Detemir (LEVEMIR FLEXTOUCH) 100 UNIT/ML Pen Inject 10 Units into the skin at bedtime.      . lamoTRIgine (LAMICTAL) 100 MG tablet TAKE 1 TABLET BY MOUTH EVERY DAY  30 tablet  1  . meclizine (ANTIVERT) 25 MG tablet Take 1 tablet (25 mg total) by mouth 3 (three) times daily as needed for dizziness.  30 tablet  1  . metFORMIN (GLUCOPHAGE) 500 MG tablet TAKE 1 TABLET BY MOUTH TWICE A DAY WITH MEALS  180 tablet  0  . ONE TOUCH LANCETS MISC Use as directed to check Glucose  100 each  3  . oxycodone (OXYCONTIN) 30 MG TB12 Take 30 mg by mouth 3 (three) times daily.      . pramipexole (MIRAPEX) 0.125 MG tablet       . tiZANidine (ZANAFLEX) 4 MG tablet Take 1 tablet by mouth Three times a day.      . lamotrigine (LAMICTAL) 25 MG disintegrating tablet 1 tab  po qhs x 15d, then increase to 2 tabs po qhs  45 tablet  0  . mirtazapine (REMERON) 30 MG tablet TAKE 1 TABLET BY MOUTH AT BEDTIME  30 tablet  0  No facility-administered medications prior to visit.    Allergies  Allergen Reactions  . Codeine     REACTION: itching  . Latex     blisters  . Penicillins     REACTION: took large quantities as a child and was told to never take again  . Zofran [Ondansetron Hcl] Hives and Rash    ROS As per HPI  PE: Blood pressure 125/76, pulse 119, temperature 97.8 F (36.6 C), temperature source Temporal, resp. rate 18, height 5' 2"  (1.575 m), weight 161 lb (73.029 kg), SpO2 95.00%. Gen: alert, tired appearing, gets up from chair on her own but is wobbly, moves slowly onto exam table.   She is oriented x 4.  Speech and thought is lucid. ENT: Ears: EACs clear, normal epithelium.  TMs with good light reflex and landmarks bilaterally.  Eyes: no injection, icteris, swelling, or exudate.  EOMI, PERRLA. Nose: no drainage or turbinate edema/swelling.  No injection or focal lesion.  Mouth: lips without lesion/swelling.  Oral mucosa pink and moist.  Dentition intact and without obvious caries or gingival swelling.  Oropharynx without erythema, exudate, or swelling.  CV: RRR, no m/r/g.   LUNGS: CTA bilat, nonlabored resps, good aeration in all lung fields. EXT: no clubbing, cyanosis, or edema.  Neuro: CN 2-12 intact bilaterally, strength 5/5 in proximal and distal upper extremities and lower extremities bilaterally.  No sensory deficits.  No tremor.  Just a hint of shakiness at the end of her reach when doing FNF bilaterally.  She walks with unsteady gait but not broad-based gait, no shuffling gait.  No pronator drift. Dix Halpike maneuver elicited vertigo with rotary nystagmus with head tilted to the righ AND to the left.  The sx's abated after approx 20 seconds, some dizziness recurred upon sitting up but no nystagmus with sitting up.  LABS:  None  today  IMPRESSION AND PLAN:  BPPV (benign paroxysmal positional vertigo) Discussed importance of home Epley maneuvers, reviewed these with her, gave pt handout to take home. I had to insist that she not drive home today so I called her husband and he agreed to come pick her up today to take her home.  BIPOLAR II DISORDER Fairly well controlled: no changes in meds at this time, esp in light of her current vertigo problems.  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 No med changes at this time, but it seemed that the adderall I had her on for a month was helpful for general anxiety, mood, and energy.  Once her vertigo has completely resolved, we'll get her back on this med.  Polypharmacy Will make every effort to minimize meds--esp psychotropic ones--despite her difficult to control psychiatric and pain issues. Ongoing pain mgmt is done via Dr. Francesco Runner.  Type II or unspecified type diabetes mellitus without mention of complication, uncontrolled Need to recheck A1c today but she feels so bad with her vertigo that I will put this off and do this at f/u in 2 wks.  An After Visit Summary was printed and given to the patient.  FOLLOW UP: Return in about 2 weeks (around 09/09/2013) for f/u vertigo and DM and psych (30 min visit).

## 2013-08-26 NOTE — Assessment & Plan Note (Signed)
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 No med changes at this time, but it seemed that the adderall I had her on for a month was helpful for general anxiety, mood, and energy.  Once her vertigo has completely resolved, we'll get her back on this med.

## 2013-08-27 ENCOUNTER — Telehealth: Payer: Self-pay | Admitting: Family Medicine

## 2013-08-27 NOTE — Telephone Encounter (Signed)
Pls check to see how the pharmacy filled it last time.-thx

## 2013-08-27 NOTE — Telephone Encounter (Signed)
Pharmacy rf request for mirapex.  Last RF was 04/29/13.  Please advise.

## 2013-08-27 NOTE — Telephone Encounter (Signed)
When I went to reorder this medication it didn't come with any instructions like you never filled it from this office before?  Please advise.

## 2013-08-27 NOTE — Telephone Encounter (Signed)
OK to RF as previously prescribed with 6 additional RF's

## 2013-08-30 MED ORDER — PRAMIPEXOLE DIHYDROCHLORIDE 0.125 MG PO TABS: ORAL_TABLET | ORAL | Status: DC

## 2013-08-30 NOTE — Telephone Encounter (Signed)
[facility] tech states RX was last filled 07/22/13 sig 4-5 tab PO QHS # 120.  Rx filled x 6 rfs as advised by Dr. Anitra Lauth.

## 2013-09-09 ENCOUNTER — Ambulatory Visit: Payer: Medicare PPO | Admitting: Family Medicine

## 2013-09-10 ENCOUNTER — Ambulatory Visit (INDEPENDENT_AMBULATORY_CARE_PROVIDER_SITE_OTHER): Payer: Medicare PPO | Admitting: Family Medicine

## 2013-09-10 ENCOUNTER — Encounter: Payer: Self-pay | Admitting: Family Medicine

## 2013-09-10 VITALS — BP 93/64 | HR 86 | Temp 97.6°F | Resp 18 | Ht 62.0 in | Wt 167.0 lb

## 2013-09-10 DIAGNOSIS — Z79899 Other long term (current) drug therapy: Secondary | ICD-10-CM

## 2013-09-10 DIAGNOSIS — E1165 Type 2 diabetes mellitus with hyperglycemia: Principal | ICD-10-CM

## 2013-09-10 DIAGNOSIS — IMO0001 Reserved for inherently not codable concepts without codable children: Secondary | ICD-10-CM

## 2013-09-10 LAB — HEMOGLOBIN A1C: HEMOGLOBIN A1C: 8.5 % — AB (ref 4.6–6.5)

## 2013-09-10 MED ORDER — CLONAZEPAM 2 MG PO TABS: ORAL_TABLET | ORAL | Status: DC

## 2013-09-10 MED ORDER — AMPHETAMINE-DEXTROAMPHETAMINE 10 MG PO TABS: ORAL_TABLET | ORAL | Status: DC

## 2013-09-10 MED ORDER — MIRTAZAPINE 30 MG PO TBDP: 30.0000 mg | ORAL_TABLET | Freq: Every day | ORAL | Status: DC

## 2013-09-10 NOTE — Progress Notes (Signed)
Pre visit review using our clinic review tool, if applicable. No additional management support is needed unless otherwise documented below in the visit note. 

## 2013-09-10 NOTE — Patient Instructions (Signed)
Take your remeron (mirtazapine) 1/2 of 45m tab for 7d, then take the whole tab every night.

## 2013-09-10 NOTE — Progress Notes (Signed)
OFFICE NOTE  09/10/2013  CC:  Chief Complaint  Patient presents with  . Follow-up   HPI: Patient is a 58 y.o. Caucasian female who is here for 2 wk f/u DM and recent problems with BPPV. Vertigo episodes responding to home epley's maneuvers: doesn't have episode every day.  Most instances are night now when turning over in bed.   Glucoses better but she is NOT EATING, no appetite.  Not much nausea anymore compared to a couple of weeks ago.  No abd pain.  Taking 18 U levemir qhs.  Took her mealtime insulin yesterday but fell asleep before she could eat, then took hs levemir.  She did not eat this morning.  No insulin this morning.  She is very sleepy today, says that ever since I weened her off of remeron she hasn't been sleeping more than a couple of hours a night.  Initiates sleep ok w/out RLS sx's, wakes up a couple hours later and cannot get back to sleep.  She is subsequently somnolent all the time in daytime lately.   MEDS:  Outpatient Prescriptions Prior to Visit  Medication Sig Dispense Refill  . albuterol (VENTOLIN HFA) 108 (90 BASE) MCG/ACT inhaler Inhale 2 puffs into the lungs every 6 (six) hours as needed for wheezing or shortness of breath.  1 Inhaler  1  . Alum & Mag Hydroxide-Simeth (MAGIC MOUTHWASH W/LIDOCAINE) SOLN 1 tsp swish and spit qAC and qhs prn  120 mL  2  . amphetamine-dextroamphetamine (ADDERALL) 10 MG tablet 1 tab po bid  60 tablet  0  . bisacodyl (BISACODYL) 5 MG EC tablet Take 1 tablet (5 mg total) by mouth daily as needed for constipation.  30 tablet  0  . clonazePAM (KLONOPIN) 2 MG tablet TALE 1 TABLET BY MOUTH THREE TIMES A DAY AS NEEDED FOR ANXIETY  90 tablet  5  . [employer]S VITAMIN D 2000 UNITS CAPS TAKE 1 CAPSULE (2,000 UNITS TOTAL) BY MOUTH DAILY.  30 capsule  2  . cyanocobalamin (,VITAMIN B-12,) 1000 MCG/ML injection Inject 1 mL (1,000 mcg total) into the muscle every 14 (fourteen) days.  10 mL  3  . docusate sodium (COLACE) 100 MG capsule Take 1 capsule (100 mg  total) by mouth 2 (two) times daily.  60 capsule  0  . DULoxetine (CYMBALTA) 30 MG capsule       . gabapentin (NEURONTIN) 300 MG capsule Take 900-1,200 mg by mouth daily. 900 mg at bedtime can take up to 1200 mg at bedtime      . insulin aspart (NOVOLOG FLEXPEN) 100 UNIT/ML FlexPen Inject 5 Units into the skin 3 (three) times daily with meals.      . Insulin Detemir (LEVEMIR FLEXTOUCH) 100 UNIT/ML Pen Inject 10 Units into the skin at bedtime.      . lamoTRIgine (LAMICTAL) 100 MG tablet TAKE 1 TABLET BY MOUTH EVERY DAY  30 tablet  1  . metFORMIN (GLUCOPHAGE) 500 MG tablet TAKE 1 TABLET BY MOUTH TWICE A DAY WITH MEALS  180 tablet  0  . oxycodone (OXYCONTIN) 30 MG TB12 Take 30 mg by mouth 3 (three) times daily.      Marland Kitchen tiZANidine (ZANAFLEX) 4 MG tablet Take 1 tablet by mouth Three times a day.      . calcium elemental as carbonate (TUMS ULTRA 1000) 400 MG tablet Chew 1,000 mg by mouth daily.        Marland Kitchen glucose blood (ONE TOUCH ULTRA TEST) test strip Use as instructed  100 each  3  . Insulin Pen Needle 31G X 6 MM MISC Use as directed  100 each  6  . meclizine (ANTIVERT) 25 MG tablet Take 1 tablet (25 mg total) by mouth 3 (three) times daily as needed for dizziness.  30 tablet  1  . ONE TOUCH LANCETS MISC Use as directed to check Glucose  100 each  3  . pramipexole (MIRAPEX) 0.125 MG tablet Take 4-5 tablets PO at QHS  120 tablet  6   No facility-administered medications prior to visit.    PE: Blood pressure 93/64, pulse 86, temperature 97.6 F (36.4 C), temperature source Temporal, resp. rate 18, height 5' 2"  (1.575 m), weight 167 lb (75.751 kg), SpO2 95.00%. Gen: somnolent but easily arousable and converses/holds attention normally.  Oriented x 4. NAD. No further exam today.  LAB: CBG today (fasting): 203  IMPRESSION AND PLAN:  1) DM 2, hx of poor control of late, hx of poor dietary and med compliance at times. Recheck A1c today.  Warned pt about possible hypoglycemia since she hasn't had  appetite lately, down-titrate or hold mealtime insulin if appropriate.  Continue 18 U levemir qhs.  2) BPPV: improving with home epley's maneuvers.  3) Insomnia, in the context of mood disorder, high risk med use/polypharmacy--pt really wants to get back on her remeron. D/c mirapex b/c RLS not bothering her anymore/pt not taking this now anyway. Restart remeron 87m qhs x 7d, then increase to 378mqd. Restart adderall 1080mid--this has been helpful for her chronic concentration problems, chronic fatigue and daytime somnolence.  I printed rx's for adderall 41m75md, #60 today for this month and for August 2015.  Appropriate fill on/after date was noted on each rx.  An After Visit Summary was printed and given to the patient.  FOLLOW UP: 2 mo

## 2013-09-14 ENCOUNTER — Encounter: Payer: Self-pay | Admitting: Family Medicine

## 2013-09-29 ENCOUNTER — Other Ambulatory Visit: Payer: Self-pay | Admitting: Family Medicine

## 2013-09-29 DIAGNOSIS — IMO0001 Reserved for inherently not codable concepts without codable children: Secondary | ICD-10-CM

## 2013-09-29 DIAGNOSIS — Z79899 Other long term (current) drug therapy: Secondary | ICD-10-CM

## 2013-09-29 DIAGNOSIS — E1165 Type 2 diabetes mellitus with hyperglycemia: Secondary | ICD-10-CM

## 2013-09-29 DIAGNOSIS — Z23 Encounter for immunization: Secondary | ICD-10-CM

## 2013-09-29 DIAGNOSIS — R231 Pallor: Secondary | ICD-10-CM

## 2013-09-29 DIAGNOSIS — R000 Tachycardia, unspecified: Secondary | ICD-10-CM

## 2013-09-29 DIAGNOSIS — N183 Chronic kidney disease, stage 3 unspecified: Secondary | ICD-10-CM

## 2013-09-29 MED ORDER — INSULIN DETEMIR 100 UNIT/ML FLEXPEN: 10.0000 [IU] | PEN_INJECTOR | Freq: Every day | SUBCUTANEOUS | Status: DC

## 2013-10-04 ENCOUNTER — Telehealth: Payer: Self-pay | Admitting: Family Medicine

## 2013-10-04 MED ORDER — PERMETHRIN 5 % EX CREA: TOPICAL_CREAM | CUTANEOUS | Status: DC

## 2013-10-04 NOTE — Telephone Encounter (Signed)
Pt aware.

## 2013-10-04 NOTE — Telephone Encounter (Signed)
Pt LMOM stating she now has scabies and wants to get Rx that you gave her husband.  Please advise.

## 2013-10-04 NOTE — Telephone Encounter (Signed)
Permethrin cream rx sent to pharmacy.

## 2013-10-12 ENCOUNTER — Other Ambulatory Visit: Payer: Self-pay | Admitting: Family Medicine

## 2013-11-05 ENCOUNTER — Other Ambulatory Visit: Payer: Self-pay | Admitting: Family Medicine

## 2013-11-05 MED ORDER — MIRTAZAPINE 30 MG PO TBDP: 30.0000 mg | ORAL_TABLET | Freq: Every day | ORAL | Status: DC

## 2013-12-07 ENCOUNTER — Telehealth: Payer: Self-pay | Admitting: Family Medicine

## 2013-12-07 NOTE — Telephone Encounter (Signed)
Rf request for gabapentin for pt.  Last OV was 09/10/13.  Last RX refill doesn't have date on it.  Please advise.

## 2013-12-08 NOTE — Telephone Encounter (Signed)
This is a rx she gets from her pain mgmt MD, Dr. Francesco Runner.  Needs to request med from him.-thx

## 2013-12-30 ENCOUNTER — Telehealth: Payer: Self-pay | Admitting: Family Medicine

## 2013-12-30 ENCOUNTER — Other Ambulatory Visit: Payer: Self-pay | Admitting: Family Medicine

## 2013-12-30 MED ORDER — METFORMIN HCL 500 MG PO TABS: 500.0000 mg | ORAL_TABLET | Freq: Two times a day (BID) | ORAL | Status: DC

## 2013-12-30 NOTE — Telephone Encounter (Signed)
She is rx'd this med by her pain mgmt MD so she needs to call him to request RF of this med.-thx

## 2013-12-30 NOTE — Telephone Encounter (Signed)
Rf request sent for gabapentin.  Patient last OV was 09/10/13.  I don't see a date on the gabapentin or that you have rx'd it.  Please advise.

## 2014-01-24 ENCOUNTER — Other Ambulatory Visit: Payer: Self-pay | Admitting: Family Medicine

## 2014-01-24 MED ORDER — CHOLECALCIFEROL 50 MCG (2000 UT) PO CAPS: ORAL_CAPSULE | ORAL | Status: DC

## 2014-02-23 ENCOUNTER — Telehealth: Payer: Self-pay | Admitting: Family Medicine

## 2014-02-23 DIAGNOSIS — R231 Pallor: Secondary | ICD-10-CM

## 2014-02-23 DIAGNOSIS — Z23 Encounter for immunization: Secondary | ICD-10-CM

## 2014-02-23 DIAGNOSIS — R000 Tachycardia, unspecified: Secondary | ICD-10-CM

## 2014-02-23 DIAGNOSIS — N183 Chronic kidney disease, stage 3 unspecified: Secondary | ICD-10-CM

## 2014-02-23 DIAGNOSIS — E119 Type 2 diabetes mellitus without complications: Secondary | ICD-10-CM

## 2014-02-23 DIAGNOSIS — Z79899 Other long term (current) drug therapy: Secondary | ICD-10-CM

## 2014-02-23 NOTE — Telephone Encounter (Signed)
Refill request for pramipexole 0.125 mg tablet # 120.  I don't see this medication in the patient's chart.  Is patient supposed to be taking this medication?  Please advise.

## 2014-02-28 NOTE — Telephone Encounter (Signed)
According to my records she has not been on this med for about 6 months. Have her check and make sure that she is still taking this.  If she has been taking it still then I'll RF it. Let me know-thx

## 2014-03-01 NOTE — Telephone Encounter (Signed)
LM for pt to CB

## 2014-03-03 NOTE — Telephone Encounter (Signed)
LM again for pt to CB.

## 2014-03-08 MED ORDER — INSULIN ASPART 100 UNIT/ML FLEXPEN: 5.0000 [IU] | PEN_INJECTOR | Freq: Three times a day (TID) | SUBCUTANEOUS | Status: DC

## 2014-03-08 NOTE — Telephone Encounter (Signed)
Patient states that she is still taking this medication but patient states she will come in for OV soon to talk to you about that medication and her sleep meds.

## 2014-03-16 ENCOUNTER — Ambulatory Visit: Payer: Medicare PPO | Admitting: Family Medicine

## 2014-03-17 ENCOUNTER — Ambulatory Visit (INDEPENDENT_AMBULATORY_CARE_PROVIDER_SITE_OTHER): Payer: Commercial Managed Care - HMO | Admitting: Family Medicine

## 2014-03-17 ENCOUNTER — Encounter: Payer: Self-pay | Admitting: Family Medicine

## 2014-03-17 VITALS — BP 125/88 | HR 116 | Temp 98.3°F | Resp 18 | Ht 62.0 in | Wt 167.0 lb

## 2014-03-17 DIAGNOSIS — E119 Type 2 diabetes mellitus without complications: Secondary | ICD-10-CM | POA: Diagnosis not present

## 2014-03-17 DIAGNOSIS — R8290 Unspecified abnormal findings in urine: Secondary | ICD-10-CM | POA: Diagnosis not present

## 2014-03-17 DIAGNOSIS — F900 Attention-deficit hyperactivity disorder, predominantly inattentive type: Secondary | ICD-10-CM | POA: Diagnosis not present

## 2014-03-17 DIAGNOSIS — F3181 Bipolar II disorder: Secondary | ICD-10-CM

## 2014-03-17 DIAGNOSIS — N3941 Urge incontinence: Secondary | ICD-10-CM | POA: Diagnosis not present

## 2014-03-17 DIAGNOSIS — G4700 Insomnia, unspecified: Secondary | ICD-10-CM | POA: Diagnosis not present

## 2014-03-17 DIAGNOSIS — F909 Attention-deficit hyperactivity disorder, unspecified type: Secondary | ICD-10-CM

## 2014-03-17 LAB — COMPREHENSIVE METABOLIC PANEL
ALT: 23 U/L (ref 0–35)
AST: 38 U/L — AB (ref 0–37)
Albumin: 4.4 g/dL (ref 3.5–5.2)
Alkaline Phosphatase: 113 U/L (ref 39–117)
BUN: 10 mg/dL (ref 6–23)
CHLORIDE: 90 meq/L — AB (ref 96–112)
CO2: 35 mEq/L — ABNORMAL HIGH (ref 19–32)
Calcium: 10.6 mg/dL — ABNORMAL HIGH (ref 8.4–10.5)
Creatinine, Ser: 1.1 mg/dL (ref 0.40–1.20)
GFR: 54.38 mL/min — AB (ref >60.00)
Glucose, Bld: 286 mg/dL — ABNORMAL HIGH (ref 70–99)
Potassium: 4.4 mEq/L (ref 3.5–5.1)
SODIUM: 134 meq/L — AB (ref 135–145)
TOTAL PROTEIN: 8.2 g/dL (ref 6.0–8.3)
Total Bilirubin: 0.4 mg/dL (ref 0.2–1.2)

## 2014-03-17 LAB — POCT URINALYSIS DIPSTICK
Bilirubin, UA: NEGATIVE
Blood, UA: NEGATIVE
GLUCOSE UA: NEGATIVE
Ketones, UA: NEGATIVE
Nitrite, UA: NEGATIVE
PROTEIN UA: 30
SPEC GRAV UA: 1.015
UROBILINOGEN UA: 1
pH, UA: 8.5

## 2014-03-17 LAB — HEMOGLOBIN A1C: Hgb A1c MFr Bld: 11.9 % — ABNORMAL HIGH (ref 4.6–6.5)

## 2014-03-17 MED ORDER — OXYBUTYNIN CHLORIDE ER 5 MG PO TB24: ORAL_TABLET | ORAL | Status: DC

## 2014-03-17 MED ORDER — METFORMIN HCL 500 MG PO TABS: 500.0000 mg | ORAL_TABLET | Freq: Two times a day (BID) | ORAL | Status: DC

## 2014-03-17 MED ORDER — GABAPENTIN 300 MG PO CAPS: ORAL_CAPSULE | ORAL | Status: DC

## 2014-03-17 NOTE — Progress Notes (Signed)
Pre visit review using our clinic review tool, if applicable. No additional management support is needed unless otherwise documented below in the visit note. 

## 2014-03-17 NOTE — Patient Instructions (Signed)
Call [facility] in [place] at [phone] and tell them you want to get established with a counselor there.

## 2014-03-17 NOTE — Progress Notes (Signed)
OFFICE NOTE  03/17/2014  CC:  Chief Complaint  Patient presents with  . Follow-up   HPI: Patient is a 59 y.o. Caucasian female who is here for 7 mo f/u DM 2, adult ADD, and insomnia.-- Has recently gone through URI/cough, fever, achy--one week ago it started.  She has some residual nasal mucous and cough.  Out of metformin for the last 2 weeks.  She has stuck with her insulin regimen despite the cost (12 U levemir qhs now, novolog at mealtime varies 5-10 units. She doesn't check her glucose at home but says she can "feel when it gets high".  She has to take 1/4 of adderall tab qAM b/c it makes her stomach hurt, but this dose does help with her energy level and poor concentration. No RLS sx's.  Taking remeron 30 mg qhs.   Sleeps 7-8 hours, just gets up frequently to go urinate in nighttime.  Has been out of neurontin lately b/c of RF problems. Daytime urinary frequency but urgency not as bad in daytime.  No dysuria. These urinary problems have been present x [place] so, worsening last 1-2 mo.  FEET: No burning, tingling, or numbness in feet.  No hx of foot ulcer.  She says her husband has told her she needs to start seeing a counselor again or their marriage will be over.  Pertinent PMH:  Past medical, surgical, social, and family history reviewed and no changes are noted since last office visit.  MEDS:  Outpatient Prescriptions Prior to Visit  Medication Sig Dispense Refill  . albuterol (VENTOLIN HFA) 108 (90 BASE) MCG/ACT inhaler Inhale 2 puffs into the lungs every 6 (six) hours as needed for wheezing or shortness of breath. 1 Inhaler 1  . amphetamine-dextroamphetamine (ADDERALL) 10 MG tablet 1 tab po bid 60 tablet 0  . calcium elemental as carbonate (TUMS ULTRA 1000) 400 MG tablet Chew 1,000 mg by mouth daily.      . Cholecalciferol ([facility] VITAMIN D) 2000 UNITS CAPS TAKE 1 CAPSULE (2,000 UNITS TOTAL) BY MOUTH DAILY. 30 capsule 3  . clonazePAM (KLONOPIN) 2 MG tablet TALE 1 TABLET BY  MOUTH THREE TIMES A DAY AS NEEDED FOR ANXIETY 90 tablet 5  . docusate sodium (COLACE) 100 MG capsule Take 1 capsule (100 mg total) by mouth 2 (two) times daily. 60 capsule 0  . DULoxetine (CYMBALTA) 30 MG capsule     . insulin aspart (NOVOLOG FLEXPEN) 100 UNIT/ML FlexPen Inject 5 Units into the skin 3 (three) times daily with meals. 15 mL 3  . Insulin Detemir (LEVEMIR FLEXTOUCH) 100 UNIT/ML Pen Inject 10 Units into the skin at bedtime. 15 mL 3  . metFORMIN (GLUCOPHAGE) 500 MG tablet Take 1 tablet (500 mg total) by mouth 2 (two) times daily with a meal. 180 tablet 1  . mirtazapine (REMERON) 30 MG tablet TAKE 1 TABLET BY MOUTH AT BEDTIME 30 tablet 0  . oxycodone (OXYCONTIN) 30 MG TB12 Take 30 mg by mouth 3 (three) times daily.    .Marland KitchentiZANidine (ZANAFLEX) 4 MG tablet Take 1 tablet by mouth Three times a day.    . mirtazapine (REMERON SOL-TAB) 30 MG disintegrating tablet Take 1 tablet (30 mg total) by mouth at bedtime. 30 tablet 3  . Alum & Mag Hydroxide-Simeth (MAGIC MOUTHWASH W/LIDOCAINE) SOLN 1 tsp swish and spit qAC and qhs prn (Patient not taking: Reported on 03/17/2014) 120 mL 2  . bisacodyl (BISACODYL) 5 MG EC tablet Take 1 tablet (5 mg total) by mouth daily  as needed for constipation. 30 tablet 0  . cyanocobalamin (,VITAMIN B-12,) 1000 MCG/ML injection Inject 1 mL (1,000 mcg total) into the muscle every 14 (fourteen) days. (Patient not taking: Reported on 03/17/2014) 10 mL 3  . gabapentin (NEURONTIN) 300 MG capsule Take 900-1,200 mg by mouth daily. 900 mg at bedtime can take up to 1200 mg at bedtime    . glucose blood (ONE TOUCH ULTRA TEST) test strip Use as instructed 100 each 3  . Insulin Pen Needle 31G X 6 MM MISC Use as directed 100 each 6  . lamoTRIgine (LAMICTAL) 100 MG tablet TAKE 1 TABLET BY MOUTH EVERY DAY 30 tablet 1  . ONE TOUCH LANCETS MISC Use as directed to check Glucose 100 each 3  . permethrin (ELIMITE) 5 % cream Apply from top of head to soles of feet, rinse off after 8-14 hours.   May repeat in 7d if needed. 60 g 0   No facility-administered medications prior to visit.    PE: Blood pressure 125/88, pulse 116, temperature 98.3 F (36.8 C), temperature source Temporal, resp. rate 18, height 5' 2"  (1.575 m), weight 167 lb (75.751 kg), SpO2 92 %. Gen: Alert, well appearing.  Patient is oriented to person, place, time, and situation. Foot exam - both normal; no swelling, tenderness or skin or vascular lesions. Color and temperature is normal. Sensation is intact. Peripheral pulses are palpable. Toenails are normal.  LAB: none today RECENT: Lab Results  Component Value Date   HGBA1C 8.5* 09/10/2013   Lab Results  Component Value Date   WBC 8.5 06/16/2013   HGB 12.9 06/16/2013   HCT 39.5 06/16/2013   MCV 89.6 06/16/2013   PLT 281.0 06/16/2013   Lab Results  Component Value Date   TSH 2.46 06/16/2013     Chemistry      Component Value Date/Time   NA 135 06/16/2013 1541   K 4.3 06/16/2013 1541   CL 98 06/16/2013 1541   CO2 26 06/16/2013 1541   BUN 11 06/16/2013 1541   CREATININE 1.1 06/16/2013 1541   CREATININE 1.08 11/13/2011 1539      Component Value Date/Time   CALCIUM 9.3 06/16/2013 1541   ALKPHOS 97 06/16/2013 1541   AST 43* 06/16/2013 1541   ALT 19 06/16/2013 1541   BILITOT 0.3 06/16/2013 1541     CC UA today: small LEU, 30 mg/dl protein, otherwise normal.  IMPRESSION AND PLAN:  1) DM 2, control not ideal.  Compliant with meds but not with glucose monitoring. Feet exam normal today. HbA1c done today.  No med changes made today.  2) Adult ADD; The current medical regimen is effective;  continue present plan and medications. She is using such a low dose of this that RF's are not needed at this time.  3) Anxiety/bipolar II d/o: she is not taking lamictal anymore (she'll double check this). Continue cymbalta and remeron and benzo at current doses, no new rx's today. Gave pt contact info for [facility] in [place] so she could establish with  a counselor there.  4) Urge incontinence: sent urine for c/s for completeness but I don't think infection is her problem. Will start trial of ditropan xl 5-46m qhs. Therapeutic expectations and side effect profile of medication discussed today.  Patient's questions answered.  An After Visit Summary was printed and given to the patient.  FOLLOW UP: 423mo

## 2014-03-20 LAB — URINE CULTURE: Colony Count: >=100000

## 2014-03-21 ENCOUNTER — Other Ambulatory Visit: Payer: Self-pay | Admitting: Family Medicine

## 2014-03-21 MED ORDER — NITROFURANTOIN MONOHYD MACRO 100 MG PO CAPS: 100.0000 mg | ORAL_CAPSULE | Freq: Two times a day (BID) | ORAL | Status: DC

## 2014-04-05 ENCOUNTER — Encounter: Payer: Self-pay | Admitting: Family Medicine

## 2014-04-05 DIAGNOSIS — M791 Myalgia: Secondary | ICD-10-CM | POA: Diagnosis not present

## 2014-04-05 DIAGNOSIS — M5020 Other cervical disc displacement, unspecified cervical region: Secondary | ICD-10-CM | POA: Diagnosis not present

## 2014-04-05 DIAGNOSIS — Z79899 Other long term (current) drug therapy: Secondary | ICD-10-CM | POA: Diagnosis not present

## 2014-04-05 DIAGNOSIS — M5030 Other cervical disc degeneration, unspecified cervical region: Secondary | ICD-10-CM | POA: Diagnosis not present

## 2014-04-14 ENCOUNTER — Other Ambulatory Visit: Payer: Self-pay | Admitting: Family Medicine

## 2014-04-14 MED ORDER — LAMOTRIGINE 100 MG PO TABS: 100.0000 mg | ORAL_TABLET | Freq: Every day | ORAL | Status: DC

## 2014-04-14 MED ORDER — METFORMIN HCL 500 MG PO TABS: 500.0000 mg | ORAL_TABLET | Freq: Two times a day (BID) | ORAL | Status: DC

## 2014-04-14 MED ORDER — CLONAZEPAM 2 MG PO TABS: ORAL_TABLET | ORAL | Status: DC

## 2014-04-14 MED ORDER — CHOLECALCIFEROL 50 MCG (2000 UT) PO CAPS: ORAL_CAPSULE | ORAL | Status: DC

## 2014-04-14 NOTE — Telephone Encounter (Signed)
Clonazepam rx printed.

## 2014-04-14 NOTE — Telephone Encounter (Signed)
Pt requesting rf of clonazepam.  Last Rx printed 09/10/13 x 5 rfs.  Please advise.

## 2014-04-15 NOTE — Telephone Encounter (Signed)
Faxed to pharmacy

## 2014-04-18 ENCOUNTER — Telehealth: Payer: Self-pay | Admitting: Family Medicine

## 2014-04-18 DIAGNOSIS — N183 Chronic kidney disease, stage 3 unspecified: Secondary | ICD-10-CM

## 2014-04-18 DIAGNOSIS — R000 Tachycardia, unspecified: Secondary | ICD-10-CM

## 2014-04-18 DIAGNOSIS — Z79891 Long term (current) use of opiate analgesic: Secondary | ICD-10-CM | POA: Diagnosis not present

## 2014-04-18 DIAGNOSIS — E119 Type 2 diabetes mellitus without complications: Secondary | ICD-10-CM

## 2014-04-18 DIAGNOSIS — R231 Pallor: Secondary | ICD-10-CM

## 2014-04-18 DIAGNOSIS — E1165 Type 2 diabetes mellitus with hyperglycemia: Secondary | ICD-10-CM

## 2014-04-18 DIAGNOSIS — Z23 Encounter for immunization: Secondary | ICD-10-CM

## 2014-04-18 DIAGNOSIS — Z79899 Other long term (current) drug therapy: Secondary | ICD-10-CM

## 2014-04-18 DIAGNOSIS — IMO0002 Reserved for concepts with insufficient information to code with codable children: Secondary | ICD-10-CM

## 2014-04-18 MED ORDER — INSULIN DETEMIR 100 UNIT/ML FLEXPEN: 10.0000 [IU] | PEN_INJECTOR | Freq: Every day | SUBCUTANEOUS | Status: DC

## 2014-04-18 NOTE — Telephone Encounter (Signed)
Pt. Needs a refill on her Levemire and Novolog. Please call in to [place]

## 2014-04-20 ENCOUNTER — Telehealth: Payer: Self-pay | Admitting: Family Medicine

## 2014-04-20 NOTE — Telephone Encounter (Signed)
Duplicate phone note opened.

## 2014-04-20 NOTE — Telephone Encounter (Signed)
Patient has been coughing with a sore throat for a few days. It is also effecting her equilibrium. She has been falling & has had to have assistance with getting up. Patient would like an OV if possible this afternoon. Her son is coming in at 1:45 this afternoon. Can she be seen?

## 2014-04-20 NOTE — Telephone Encounter (Signed)
Looks like she is on the schedule for tomorrow morning.

## 2014-04-20 NOTE — Telephone Encounter (Signed)
Please advise? Schedule is full.

## 2014-04-21 ENCOUNTER — Ambulatory Visit: Payer: Self-pay | Admitting: Family Medicine

## 2014-05-20 ENCOUNTER — Telehealth: Payer: Self-pay | Admitting: Family Medicine

## 2014-05-20 ENCOUNTER — Other Ambulatory Visit (INDEPENDENT_AMBULATORY_CARE_PROVIDER_SITE_OTHER): Payer: Commercial Managed Care - HMO

## 2014-05-20 ENCOUNTER — Encounter: Payer: Self-pay | Admitting: Family Medicine

## 2014-05-20 DIAGNOSIS — R350 Frequency of micturition: Secondary | ICD-10-CM

## 2014-05-20 LAB — POCT URINALYSIS DIPSTICK
BILIRUBIN UA: NEGATIVE
Glucose, UA: 500
KETONES UA: NEGATIVE
Leukocytes, UA: NEGATIVE
Nitrite, UA: NEGATIVE
PH UA: 5.5
Spec Grav, UA: 1.015
Urobilinogen, UA: 0.2

## 2014-05-20 MED ORDER — SULFAMETHOXAZOLE-TRIMETHOPRIM 800-160 MG PO TABS: ORAL_TABLET | ORAL | Status: DC

## 2014-05-20 NOTE — Telephone Encounter (Signed)
Pt came to office today b/c she thought she had a f/u appt. When told she did not have appt, she said she had some mild pain in urethral region when she was at the end of urinating lately, some foul odor of urine, and dark color of urine. I obtained a CC UA which showed trace intact blood, 500 mg/dl glucose, and trace protein, otherwise normal. Based on sx's and trace blood I sent in rx for septra Ds 1 bid x 3d and sent urine for c/s.

## 2014-05-23 LAB — URINE CULTURE

## 2014-06-27 ENCOUNTER — Telehealth: Payer: Self-pay | Admitting: Family Medicine

## 2014-06-27 NOTE — Telephone Encounter (Signed)
Pt will contact [facility] and get mammogram scheduled.

## 2014-07-13 ENCOUNTER — Encounter: Payer: Self-pay | Admitting: *Deleted

## 2014-07-13 ENCOUNTER — Ambulatory Visit (INDEPENDENT_AMBULATORY_CARE_PROVIDER_SITE_OTHER): Payer: Commercial Managed Care - HMO | Admitting: Family Medicine

## 2014-07-13 ENCOUNTER — Encounter: Payer: Self-pay | Admitting: Family Medicine

## 2014-07-13 VITALS — BP 146/73 | HR 96 | Temp 98.0°F | Resp 16 | Wt 173.0 lb

## 2014-07-13 DIAGNOSIS — R635 Abnormal weight gain: Secondary | ICD-10-CM

## 2014-07-13 DIAGNOSIS — E1069 Type 1 diabetes mellitus with other specified complication: Secondary | ICD-10-CM

## 2014-07-13 DIAGNOSIS — F900 Attention-deficit hyperactivity disorder, predominantly inattentive type: Secondary | ICD-10-CM

## 2014-07-13 DIAGNOSIS — N183 Chronic kidney disease, stage 3 unspecified: Secondary | ICD-10-CM

## 2014-07-13 DIAGNOSIS — Z862 Personal history of diseases of the blood and blood-forming organs and certain disorders involving the immune mechanism: Secondary | ICD-10-CM

## 2014-07-13 DIAGNOSIS — Z1239 Encounter for other screening for malignant neoplasm of breast: Secondary | ICD-10-CM

## 2014-07-13 DIAGNOSIS — Z79899 Other long term (current) drug therapy: Secondary | ICD-10-CM

## 2014-07-13 DIAGNOSIS — IMO0002 Reserved for concepts with insufficient information to code with codable children: Secondary | ICD-10-CM

## 2014-07-13 DIAGNOSIS — F909 Attention-deficit hyperactivity disorder, unspecified type: Secondary | ICD-10-CM

## 2014-07-13 DIAGNOSIS — E1065 Type 1 diabetes mellitus with hyperglycemia: Secondary | ICD-10-CM

## 2014-07-13 DIAGNOSIS — E108 Type 1 diabetes mellitus with unspecified complications: Secondary | ICD-10-CM

## 2014-07-13 LAB — CBC WITH DIFFERENTIAL/PLATELET
Basophils Absolute: 0 10*3/uL (ref 0.0–0.1)
Basophils Relative: 0.4 % (ref 0.0–3.0)
Eosinophils Absolute: 0.2 10*3/uL (ref 0.0–0.7)
Eosinophils Relative: 3.4 % (ref 0.0–5.0)
HEMATOCRIT: 37.6 % (ref 36.0–46.0)
HEMOGLOBIN: 12.4 g/dL (ref 12.0–15.0)
LYMPHS PCT: 33.3 % (ref 12.0–46.0)
Lymphs Abs: 2.3 10*3/uL (ref 0.7–4.0)
MCHC: 33 g/dL (ref 30.0–36.0)
MCV: 94.9 fl (ref 78.0–100.0)
Monocytes Absolute: 0.3 10*3/uL (ref 0.1–1.0)
Monocytes Relative: 4.1 % (ref 3.0–12.0)
NEUTROS PCT: 58.8 % (ref 43.0–77.0)
Neutro Abs: 4 10*3/uL (ref 1.4–7.7)
Platelets: 248 10*3/uL (ref 150.0–400.0)
RBC: 3.96 Mil/uL (ref 3.87–5.11)
RDW: 14.2 % (ref 11.5–15.5)
WBC: 6.8 10*3/uL (ref 4.0–10.5)

## 2014-07-13 LAB — BASIC METABOLIC PANEL
BUN: 9 mg/dL (ref 6–23)
CO2: 30 mEq/L (ref 19–32)
CREATININE: 0.91 mg/dL (ref 0.40–1.20)
Calcium: 9.5 mg/dL (ref 8.4–10.5)
Chloride: 103 mEq/L (ref 96–112)
GFR: 67.6 mL/min (ref >60.00)
GLUCOSE: 119 mg/dL — AB (ref 70–99)
Potassium: 4.5 mEq/L (ref 3.5–5.1)
SODIUM: 138 meq/L (ref 135–145)

## 2014-07-13 LAB — HEMOGLOBIN A1C: HEMOGLOBIN A1C: 10.9 % — AB (ref 4.6–6.5)

## 2014-07-13 LAB — TSH: TSH: 1.65 u[IU]/mL (ref 0.35–4.50)

## 2014-07-13 MED ORDER — INSULIN LISPRO 100 UNIT/ML (KWIKPEN): PEN_INJECTOR | SUBCUTANEOUS | Status: DC

## 2014-07-13 MED ORDER — GLUCOSE BLOOD VI STRP: ORAL_STRIP | Status: DC

## 2014-07-13 MED ORDER — AMPHETAMINE-DEXTROAMPHETAMINE 10 MG PO TABS: ORAL_TABLET | ORAL | Status: DC

## 2014-07-13 MED ORDER — LAMOTRIGINE 100 MG PO TABS: 100.0000 mg | ORAL_TABLET | Freq: Two times a day (BID) | ORAL | Status: DC

## 2014-07-13 MED ORDER — CYANOCOBALAMIN 1000 MCG/ML IJ SOLN: 1000.0000 ug | INTRAMUSCULAR | Status: DC

## 2014-07-13 MED ORDER — LORAZEPAM 2 MG PO TABS: 2.0000 mg | ORAL_TABLET | Freq: Three times a day (TID) | ORAL | Status: DC | PRN

## 2014-07-13 NOTE — Progress Notes (Signed)
Pre visit review using our clinic review tool, if applicable. No additional management support is needed unless otherwise documented below in the visit note. 

## 2014-07-13 NOTE — Progress Notes (Signed)
OFFICE NOTE  07/13/2014  CC:  Chief Complaint  Patient presents with  . Follow-up    4 month f/u. Pt is fasting.     HPI: Patient is a 59 y.o. Caucasian female who is here for 4 mo f/u DM 2, CRI stage III, adult ADD with GAD/agoraphobia/panic + bipolar II disorder, urge incontinence. Did trial of ditropan xl 5-41m qhs last visit.  Says this only made her have painful urination +urinary odor.  These sx's resolved when she stopped taking the med.  Says she is eating diabetic diet, taking insulins : 10 U novolog qAC and 21 U levemir qhs. Glucoses: only monitoring the last 10 d or so: basically 200s avg fasting, 300-400 avg random/postprandial.  Says she eats one meal a day, usually supper, drinks diet drinks throughout the day.  Says her typical meal is 2 slices of cheese toast. Says she has been doing this for months.  She is upset /baffled by her continued trend of wt gain despite this poor caloric intake.  She does not exercise any.  Takes 1 ten mg adderall once a day.  Also takes remeron, cymbalta, clonaz, and lamictal as rx'd. Still suffering from bad anxiety and bad insomnia, moods swinging/crying/emotional/irritable.    Pertinent PMH:  Past medical, surgical, social, and family history reviewed and no changes are noted since last office visit.  MEDS: Not taking abx or ditropan listed below Outpatient Prescriptions Prior to Visit  Medication Sig Dispense Refill  . albuterol (VENTOLIN HFA) 108 (90 BASE) MCG/ACT inhaler Inhale 2 puffs into the lungs every 6 (six) hours as needed for wheezing or shortness of breath. 1 Inhaler 1  . Alum & Mag Hydroxide-Simeth (MAGIC MOUTHWASH W/LIDOCAINE) SOLN 1 tsp swish and spit qAC and qhs prn 120 mL 2  . amphetamine-dextroamphetamine (ADDERALL) 10 MG tablet 1 tab po bid 60 tablet 0  . B-D UF III MINI PEN NEEDLES 31G X 5 MM MISC See admin instructions.  6  . bisacodyl (BISACODYL) 5 MG EC tablet Take 1 tablet (5 mg total) by mouth daily as needed  for constipation. 30 tablet 0  . calcium elemental as carbonate (TUMS ULTRA 1000) 400 MG tablet Chew 1,000 mg by mouth daily.      . Cholecalciferol ([employer]S VITAMIN D) 2000 UNITS CAPS TAKE 1 CAPSULE (2,000 UNITS TOTAL) BY MOUTH DAILY. 30 capsule 6  . clonazePAM (KLONOPIN) 2 MG tablet TALE 1 TABLET BY MOUTH THREE TIMES A DAY AS NEEDED FOR ANXIETY 90 tablet 5  . cyanocobalamin (,VITAMIN B-12,) 1000 MCG/ML injection Inject 1 mL (1,000 mcg total) into the muscle every 14 (fourteen) days. 10 mL 3  . docusate sodium (COLACE) 100 MG capsule Take 1 capsule (100 mg total) by mouth 2 (two) times daily. 60 capsule 0  . DULoxetine (CYMBALTA) 30 MG capsule     . gabapentin (NEURONTIN) 300 MG capsule 1200 mg qhs 360 capsule 3  . glucose blood (ONE TOUCH ULTRA TEST) test strip Use as instructed 100 each 3  . insulin aspart (NOVOLOG FLEXPEN) 100 UNIT/ML FlexPen Inject 5 Units into the skin 3 (three) times daily with meals. 15 mL 3  . Insulin Detemir (LEVEMIR FLEXTOUCH) 100 UNIT/ML Pen Inject 10 Units into the skin at bedtime. 15 mL 3  . Insulin Pen Needle 31G X 6 MM MISC Use as directed 100 each 6  . lamoTRIgine (LAMICTAL) 100 MG tablet Take 1 tablet (100 mg total) by mouth daily. 30 tablet 3  . metFORMIN (GLUCOPHAGE) 500 MG  tablet Take 1 tablet (500 mg total) by mouth 2 (two) times daily with a meal. 180 tablet 1  . mirtazapine (REMERON) 30 MG tablet TAKE 1 TABLET BY MOUTH AT BEDTIME 30 tablet 0  . ONE TOUCH LANCETS MISC Use as directed to check Glucose 100 each 3  . oxycodone (OXYCONTIN) 30 MG TB12 Take 30 mg by mouth 3 (three) times daily.    Marland Kitchen tiZANidine (ZANAFLEX) 4 MG tablet Take 1 tablet by mouth Three times a day.    . oxybutynin (DITROPAN-XL) 5 MG 24 hr tablet 1-2 tabs po qhs for overactive bladder (Patient not taking: Reported on 07/13/2014) 60 tablet 6  . nitrofurantoin, macrocrystal-monohydrate, (MACROBID) 100 MG capsule Take 1 capsule (100 mg total) by mouth 2 (two) times daily. (Patient not taking:  Reported on 07/13/2014) 10 capsule 0  . sulfamethoxazole-trimethoprim (BACTRIM DS,SEPTRA DS) 800-160 MG per tablet 1 tab po bid x 3d (Patient not taking: Reported on 07/13/2014) 6 tablet 0   No facility-administered medications prior to visit.    PE: Blood pressure 146/73, pulse 96, temperature 98 F (36.7 C), temperature source Oral, resp. rate 16, weight 173 lb (78.472 kg), SpO2 94 %. Wt up 6 lbs since last visit. Gen: Alert, well appearing.  Patient is oriented to person, place, time, and situation. No further exam today.  LABS:  Lab Results  Component Value Date   HGBA1C 11.9* 03/17/2014     Chemistry      Component Value Date/Time   NA 134* 03/17/2014 1209   K 4.4 03/17/2014 1209   CL 90* 03/17/2014 1209   CO2 35* 03/17/2014 1209   BUN 10 03/17/2014 1209   CREATININE 1.10 03/17/2014 1209   CREATININE 1.08 11/13/2011 1539      Component Value Date/Time   CALCIUM 10.6* 03/17/2014 1209   ALKPHOS 113 03/17/2014 1209   AST 38* 03/17/2014 1209   ALT 23 03/17/2014 1209   BILITOT 0.4 03/17/2014 1209     Lab Results  Component Value Date   WBC 8.5 06/16/2013   HGB 12.9 06/16/2013   HCT 39.5 06/16/2013   MCV 89.6 06/16/2013   PLT 281.0 06/16/2013   Lab Results  Component Value Date   TSH 2.46 06/16/2013   Lab Results  Component Value Date   CHOL 80 11/13/2011   HDL 35* 11/13/2011   LDLCALC 27 11/13/2011   TRIG 88 11/13/2011   CHOLHDL 2.3 11/13/2011   IMPRESSION AND PLAN:  1) DM 2, poor control. Hba1c and urine microalb/cr today. Diab retpthy screening exam overdue: pt reminded. Introduced the topic of adding statin to med regimen in near future.  2) Psych: poor control of chronic anxiety and bipolar II disorder. Increase lamictal to 100 mg bid. Change clonazepam to ativan: 19m tid prn (she recalls this benzo helping her better in the past). Also gave rx today for her adderall 128m 1 bid, #60.  3) Abnormal wt gain: likely med related.  Check TSH today.  4)  CRI stage III: check BMET.  5) Preventative health care: mammogram ordered for breast cancer screening.  An After Visit Summary was printed and given to the patient.  FOLLOW UP: 3 mo

## 2014-07-14 LAB — MICROALBUMIN, URINE: Microalb, Ur: 1.1 mg/dL

## 2014-07-18 ENCOUNTER — Other Ambulatory Visit: Payer: Self-pay | Admitting: *Deleted

## 2014-07-18 NOTE — Telephone Encounter (Signed)
Fax from [facility]) requesting refill for Mirtazapine. LOV 07/13/14, up coming ov 10/13/14, last written: 09/29/13 w/ 0RF #30.

## 2014-07-19 MED ORDER — MIRTAZAPINE 30 MG PO TABS: 30.0000 mg | ORAL_TABLET | Freq: Every day | ORAL | Status: DC

## 2014-08-30 ENCOUNTER — Other Ambulatory Visit: Payer: Self-pay | Admitting: *Deleted

## 2014-08-30 MED ORDER — BD PEN NEEDLE MINI U/F 31G X 5 MM MISC: Status: DC

## 2014-08-30 NOTE — Telephone Encounter (Signed)
RF request for BD Ultra-Fine Pen Needle [id number] LOV: 07/13/14 Next ov: 10/13/14

## 2014-08-31 ENCOUNTER — Telehealth: Payer: Self-pay | Admitting: *Deleted

## 2014-08-31 DIAGNOSIS — M5030 Other cervical disc degeneration, unspecified cervical region: Secondary | ICD-10-CM | POA: Diagnosis not present

## 2014-08-31 DIAGNOSIS — M791 Myalgia: Secondary | ICD-10-CM | POA: Diagnosis not present

## 2014-08-31 DIAGNOSIS — M47812 Spondylosis without myelopathy or radiculopathy, cervical region: Secondary | ICD-10-CM | POA: Diagnosis not present

## 2014-08-31 DIAGNOSIS — M4802 Spinal stenosis, cervical region: Secondary | ICD-10-CM | POA: Diagnosis not present

## 2014-08-31 NOTE — Telephone Encounter (Signed)
Pt LMOM stating that the Aitvan is not helping with her panic attacks, she stated that she is having several almost back to back. She stated that she would like to have something else called in or go back to clonazepam. She also requested something to help with her nerves. Please advise. Thanks.

## 2014-09-01 ENCOUNTER — Ambulatory Visit: Payer: Commercial Managed Care - HMO | Admitting: Family Medicine

## 2014-09-01 NOTE — Telephone Encounter (Signed)
Pt advised. Pt stated " HOW DO YOU (Dr. Anitra Lauth) [place]" that Dr. Anitra Lauth has not even seen her since she started the Ativan. She stated that she does not care to continue with the Ativan, she wants her clonazepam. I advised pt that Dr. Anitra Lauth is out of the office this afternoon but will be back in the morning. Pt voiced understanding.

## 2014-09-01 NOTE — Telephone Encounter (Signed)
Tell her we have gone through this before. The reason we switched to ativan is because the CLONAZEPAM was not effective enough and she said that ativan was the one that worked for her.  Tell her that sometimes anxiety simply flares up and it has nothing to do with what medication she is on.  Stick with ativan.  Reassure pt.-thx

## 2014-09-02 ENCOUNTER — Telehealth: Payer: Self-pay | Admitting: *Deleted

## 2014-09-02 ENCOUNTER — Telehealth: Payer: Self-pay | Admitting: Family Medicine

## 2014-09-02 ENCOUNTER — Other Ambulatory Visit: Payer: Self-pay | Admitting: Family Medicine

## 2014-09-02 DIAGNOSIS — Z79899 Other long term (current) drug therapy: Secondary | ICD-10-CM

## 2014-09-02 DIAGNOSIS — F909 Attention-deficit hyperactivity disorder, unspecified type: Secondary | ICD-10-CM

## 2014-09-02 MED ORDER — AMPHETAMINE-DEXTROAMPHETAMINE 10 MG PO TABS: ORAL_TABLET | ORAL | Status: DC

## 2014-09-02 MED ORDER — METFORMIN HCL 500 MG PO TABS: 500.0000 mg | ORAL_TABLET | Freq: Two times a day (BID) | ORAL | Status: DC

## 2014-09-02 MED ORDER — METFORMIN HCL 1000 MG PO TABS: 1000.0000 mg | ORAL_TABLET | Freq: Two times a day (BID) | ORAL | Status: DC

## 2014-09-02 NOTE — Telephone Encounter (Signed)
Pt is moving tomorrow to [place]. She needs a refill on her Metforman and her adderall.

## 2014-09-02 NOTE — Telephone Encounter (Signed)
Pt called and said she needs a refill for her Metformin. She is moving tomorrow to [place].

## 2014-09-02 NOTE — Telephone Encounter (Signed)
HOld firm. I am not prescribing any different med at this time. If she starts getting rude, politely end the call and tell her I will call her to discuss this.

## 2014-09-02 NOTE — Telephone Encounter (Signed)
Noted  

## 2014-09-02 NOTE — Telephone Encounter (Signed)
Pharmacy called and stated that they received Rx for metformin take 1 tablet BID but pt states that she is suppose to take 2 tablet BID. Please advise. Thanks.

## 2014-09-02 NOTE — Telephone Encounter (Signed)
Error repeat message.

## 2014-09-02 NOTE — Telephone Encounter (Signed)
Pt advised and voiced understanding. She did apologize for message earlier, stated that she is very stressed with having to move and her separation and she just doesn't know what to do. She did state that she can not come p/u Rx for Adderall, she did understand that there was no other way for this Rx to be sent to her pharmacy. She stated that we could shred Rx since she can not p/u it up.

## 2014-09-02 NOTE — Telephone Encounter (Signed)
RF request for Adderall.  LOV: 07/13/14 Next ov: None Last written: 07/13/14 #60 w/ 0RF Please advise. Thanks.  Rx for metformin sent to [facility] for #180 w/ 1RF.

## 2014-09-02 NOTE — Telephone Encounter (Signed)
Pt advised, and stated "Thats fine I'm leaving town today and I will find a new doctor. I can't believe my doctor will not help me. God help me and please pray for me."

## 2014-09-02 NOTE — Telephone Encounter (Signed)
OK, I d/c'd the 549m tabs and sent in new rx for 10076mmetformin, 1 tab bid.

## 2014-09-05 ENCOUNTER — Telehealth: Payer: Self-pay | Admitting: *Deleted

## 2014-09-05 NOTE — Telephone Encounter (Signed)
Fax message from [facility] stating [facility] Ba[facility] requesting [facility]ill [facility] Novolog. Left message for pt to call back need to know if she has been using Novolog or Humalog.

## 2014-09-12 NOTE — Telephone Encounter (Signed)
Left message for pt to call back  °

## 2014-09-19 NOTE — Telephone Encounter (Signed)
Pt has not returned calls, will save to chart.

## 2014-09-26 ENCOUNTER — Other Ambulatory Visit: Payer: Self-pay | Admitting: Geriatric Medicine

## 2014-09-26 MED ORDER — CHOLECALCIFEROL 50 MCG (2000 UT) PO CAPS: ORAL_CAPSULE | ORAL | Status: DC

## 2014-09-27 ENCOUNTER — Other Ambulatory Visit: Payer: Self-pay | Admitting: *Deleted

## 2014-09-27 MED ORDER — INSULIN LISPRO 100 UNIT/ML (KWIKPEN): PEN_INJECTOR | SUBCUTANEOUS | Status: DC

## 2014-09-27 NOTE — Telephone Encounter (Signed)
RF request for Novolog (which is not covered by pts insurance or on pts medication list) Rx for Humalog sent to pharmacy since it was on pt medication list.  LOV: 07/13/14 Next ov: None Last written: 07/13/14 82m with 11RF.

## 2014-09-28 ENCOUNTER — Ambulatory Visit: Payer: Commercial Managed Care - HMO | Admitting: Family Medicine

## 2014-10-05 DIAGNOSIS — M4802 Spinal stenosis, cervical region: Secondary | ICD-10-CM | POA: Diagnosis not present

## 2014-10-05 DIAGNOSIS — M47812 Spondylosis without myelopathy or radiculopathy, cervical region: Secondary | ICD-10-CM | POA: Diagnosis not present

## 2014-10-05 DIAGNOSIS — M5030 Other cervical disc degeneration, unspecified cervical region: Secondary | ICD-10-CM | POA: Diagnosis not present

## 2014-10-05 DIAGNOSIS — M791 Myalgia: Secondary | ICD-10-CM | POA: Diagnosis not present

## 2014-10-13 ENCOUNTER — Ambulatory Visit: Payer: Commercial Managed Care - HMO | Admitting: Family Medicine

## 2014-10-20 ENCOUNTER — Encounter: Payer: Self-pay | Admitting: Family Medicine

## 2014-10-20 ENCOUNTER — Encounter: Payer: Self-pay | Admitting: *Deleted

## 2014-10-20 ENCOUNTER — Telehealth: Payer: Self-pay | Admitting: Family Medicine

## 2014-10-20 ENCOUNTER — Ambulatory Visit (INDEPENDENT_AMBULATORY_CARE_PROVIDER_SITE_OTHER): Payer: Commercial Managed Care - HMO | Admitting: Family Medicine

## 2014-10-20 VITALS — BP 109/73 | HR 105 | Temp 97.8°F | Resp 16 | Ht 62.0 in | Wt 157.0 lb

## 2014-10-20 DIAGNOSIS — F3181 Bipolar II disorder: Secondary | ICD-10-CM | POA: Diagnosis not present

## 2014-10-20 DIAGNOSIS — F909 Attention-deficit hyperactivity disorder, unspecified type: Secondary | ICD-10-CM

## 2014-10-20 DIAGNOSIS — E119 Type 2 diabetes mellitus without complications: Secondary | ICD-10-CM

## 2014-10-20 DIAGNOSIS — F900 Attention-deficit hyperactivity disorder, predominantly inattentive type: Secondary | ICD-10-CM | POA: Diagnosis not present

## 2014-10-20 DIAGNOSIS — Z79899 Other long term (current) drug therapy: Secondary | ICD-10-CM | POA: Diagnosis not present

## 2014-10-20 DIAGNOSIS — F411 Generalized anxiety disorder: Secondary | ICD-10-CM

## 2014-10-20 DIAGNOSIS — N183 Chronic kidney disease, stage 3 unspecified: Secondary | ICD-10-CM

## 2014-10-20 DIAGNOSIS — N2889 Other specified disorders of kidney and ureter: Secondary | ICD-10-CM

## 2014-10-20 DIAGNOSIS — Z23 Encounter for immunization: Secondary | ICD-10-CM | POA: Diagnosis not present

## 2014-10-20 LAB — BASIC METABOLIC PANEL
BUN: 8 mg/dL (ref 6–23)
CHLORIDE: 101 meq/L (ref 96–112)
CO2: 31 mEq/L (ref 19–32)
Calcium: 10.1 mg/dL (ref 8.4–10.5)
Creatinine, Ser: 0.89 mg/dL (ref 0.40–1.20)
GFR: 69.29 mL/min (ref >60.00)
Glucose, Bld: 174 mg/dL — ABNORMAL HIGH (ref 70–99)
POTASSIUM: 4.3 meq/L (ref 3.5–5.1)
Sodium: 139 mEq/L (ref 135–145)

## 2014-10-20 LAB — HEMOGLOBIN A1C: HEMOGLOBIN A1C: 7.3 % — AB (ref 4.6–6.5)

## 2014-10-20 MED ORDER — CYANOCOBALAMIN 1000 MCG/ML IJ SOLN: 1000.0000 ug | INTRAMUSCULAR | Status: DC

## 2014-10-20 MED ORDER — AMPHETAMINE-DEXTROAMPHETAMINE 10 MG PO TABS: ORAL_TABLET | ORAL | Status: DC

## 2014-10-20 NOTE — Telephone Encounter (Signed)
FYI

## 2014-10-20 NOTE — Progress Notes (Signed)
OFFICE VISIT  10/24/2014   CC:  Chief Complaint  Patient presents with  . Follow-up    DM and Anxiety     HPI:    Patient is a 59 y.o. Caucasian female who presents for 3 mo f/u DM 2, CRI stage III, anxiety, mood disorder. Lives by herself now in [place].  DM 2: hx of brittle DM, often high numbers and then when we increase insulins a little she starts to have lows. She has now stopped her insulin for the most part: takes some prandial insulin (7 units) with some meals. She dropped her metformin to 500 mg bid. Her glucoses have done this in the past when she has lost wt like she has recently (about 18 lbs).  Not eating a lot due to poor psychiatric state: separated with boyfriend, broken hearted.  Says she has been taking all psych meds as rx'd.   She asks for [facility] referral for counseling.  Her pain mgmt MD is weening her off of her oxycodone.     Past Medical History  Diagnosis Date  . Diabetes mellitus     urine protein-Cr ratio nl 09/2010, D.R. Screen neg 10/2009  . Obesity     s/p bariatric surgery  . Hepatic steatosis 2005    ultrasound  . Bipolar disorder     questionable  . Restless legs syndrome   . Insomnia   . Neurosis, anxiety, panic type   . Chronic fatigue   . Iron deficiency anemia 09/2009    malabsorbtion s/p bariatric surgery  . Vitamin D deficiency 09/2009  . Other B-complex deficiencies   . Nephrolithiasis 11/2012  . Osteopenia 12/2010    Hip T score -2.0.  Spine T score -0.5 (plan to repeat in 2 yrs)  . Colon cancer screening 03/06/2011    iFob 11/2010 through her insurer was negative.   . Tobacco dependence   . Chronic nausea 2012/13    Normal upper GI  12/2011 (mild GERD noted +small hiatal hernia)+  . Chronic neck pain     cervical spondylosis on 2013 MRI  . Chronic renal insufficiency, stage III (moderate)     CrCl @50ml /min    Past Surgical History  Procedure Laterality Date  . Roux-en-y gastric bypass    . Tonsillectomy     . Abdominal hysterectomy      fibroids, ovaries remain  . Bladder surgery      bladder tack  . Appendectomy    . Hernia repair  2005    mesenteric hernia repair, with lysis of adhesions (presented with SBO)  . Cholecystectomy  04/2010    w/lysis of adhesions (open procedure)--path showed chronic cholecystitis and cholesterol polyp.  Marland Kitchen Epidural block injection  03/2011    Cervical  . Cervical spine surgery  09/2009  . Cystoscopy/retrograde/ureteroscopy/stone extraction with basket  11/2012    Left UVJ stone  . Cystoscopy/retrograde/ureteroscopy/stone extraction with basket Left 11/21/2012    Procedure: CYSTOSCOPY/RETROGRADE/URETEROSCOPY/STONE EXTRACTION WITH BASKET insertion double j stent;  Surgeon: Ailene Rud, MD;  Location: [facility];  Service: Urology;  Laterality: Left;    Outpatient Prescriptions Prior to Visit  Medication Sig Dispense Refill  . albuterol (VENTOLIN HFA) 108 (90 BASE) MCG/ACT inhaler Inhale 2 puffs into the lungs every 6 (six) hours as needed for wheezing or shortness of breath. 1 Inhaler 1  . B-D UF III MINI PEN NEEDLES 31G X 5 MM MISC Use as directed 100 each 6  . calcium elemental as carbonate (TUMS  ULTRA 1000) 400 MG tablet Chew 1,000 mg by mouth daily.      . Cholecalciferol ([employer] VITAMIN D) 2000 UNITS CAPS TAKE 1 CAPSULE (2,000 UNITS TOTAL) BY MOUTH DAILY. 30 capsule 6  . DULoxetine (CYMBALTA) 30 MG capsule     . glucose blood (ONE TOUCH ULTRA TEST) test strip Check glucose 4 times per day 100 each 12  . Insulin Detemir (LEVEMIR FLEXTOUCH) 100 UNIT/ML Pen Inject 10 Units into the skin at bedtime. 15 mL 3  . insulin lispro (HUMALOG KWIKPEN) 100 UNIT/ML KiwkPen 15 U SQ qAC 15 mL 3  . Insulin Pen Needle 31G X 6 MM MISC Use as directed 100 each 6  . lamoTRIgine (LAMICTAL) 100 MG tablet Take 1 tablet (100 mg total) by mouth 2 (two) times daily. 60 tablet 4  . LORazepam (ATIVAN) 2 MG tablet Take 1 tablet (2 mg total) by mouth every 8 (eight) hours as needed for  anxiety. 90 tablet 5  . metFORMIN (GLUCOPHAGE) 1000 MG tablet Take 1 tablet (1,000 mg total) by mouth 2 (two) times daily with a meal. 60 tablet 6  . mirtazapine (REMERON) 30 MG tablet Take 1 tablet (30 mg total) by mouth at bedtime. 30 tablet 6  . ONE TOUCH LANCETS MISC Use as directed to check Glucose 100 each 3  . oxycodone (OXYCONTIN) 30 MG TB12 Take 30 mg by mouth 3 (three) times daily.    Marland Kitchen tiZANidine (ZANAFLEX) 4 MG tablet Take 1 tablet by mouth Three times a day.    . amphetamine-dextroamphetamine (ADDERALL) 10 MG tablet 1 tab po bid 60 tablet 0  . cyanocobalamin (,VITAMIN B-12,) 1000 MCG/ML injection Inject 1 mL (1,000 mcg total) into the muscle every 14 (fourteen) days. 10 mL 3  . bisacodyl (BISACODYL) 5 MG EC tablet Take 1 tablet (5 mg total) by mouth daily as needed for constipation. 30 tablet 0  . Alum & Mag Hydroxide-Simeth (MAGIC MOUTHWASH W/LIDOCAINE) SOLN 1 tsp swish and spit qAC and qhs prn (Patient not taking: Reported on 10/20/2014) 120 mL 2  . docusate sodium (COLACE) 100 MG capsule Take 1 capsule (100 mg total) by mouth 2 (two) times daily. (Patient not taking: Reported on 10/20/2014) 60 capsule 0  . gabapentin (NEURONTIN) 300 MG capsule 1200 mg qhs (Patient not taking: Reported on 10/20/2014) 360 capsule 3   No facility-administered medications prior to visit.    Allergies  Allergen Reactions  . Codeine     REACTION: itching  . Latex     blisters  . Penicillins     REACTION: took large quantities as a child and was told to never take again  . Zofran [Ondansetron Hcl] Hives and Rash    ROS As per HPI  PE: Blood pressure 109/73, pulse 105, temperature 97.8 F (36.6 C), temperature source Oral, resp. rate 16, height 5' 2"  (1.575 m), weight 157 lb (71.215 kg), SpO2 95 %. Gen: Alert, well appearing.  Patient is oriented to person, place, time, and situation. Crying the whole visit, lamenting about her home situation. CV: RRR, no m/r/g.   LUNGS: CTA bilat, nonlabored  resps, good aeration in all lung fields. EXT: no clubbing, cyanosis, or edema.  SKIN: no rash.  LABS:  Lab Results  Component Value Date   TSH 1.65 07/13/2014   Lab Results  Component Value Date   WBC 6.8 07/13/2014   HGB 12.4 07/13/2014   HCT 37.6 07/13/2014   MCV 94.9 07/13/2014   PLT 248.0 07/13/2014   Lab  Results  Component Value Date   CREATININE 0.89 10/20/2014   BUN 8 10/20/2014   NA 139 10/20/2014   K 4.3 10/20/2014   CL 101 10/20/2014   CO2 31 10/20/2014   Lab Results  Component Value Date   ALT 23 03/17/2014   AST 38* 03/17/2014   ALKPHOS 113 03/17/2014   BILITOT 0.4 03/17/2014   Lab Results  Component Value Date   CHOL 80 11/13/2011   Lab Results  Component Value Date   HDL 35* 11/13/2011   Lab Results  Component Value Date   LDLCALC 27 11/13/2011   Lab Results  Component Value Date   TRIG 88 11/13/2011   Lab Results  Component Value Date   CHOLHDL 2.3 11/13/2011   Lab Results  Component Value Date   HGBA1C 7.3* 10/20/2014   IMPRESSION AND PLAN:  1) DM 2; glucoses normal-to-low lately due to dietary changes. Hold insulin for now and continue only 500 mg bid metformin. HbA1c and BMET today. Flu vaccine today.  2) CRI, stage III; lytes/cr today.  3) Psychiatric: significant GAD with panic and agoraphobia.  Suspect bipolar II d/o, recent depressed phase largely triggered by separation from her longtime boyfriend.  Referral to [facility] per pt request today. I have her on adderall for her inattentive/focus tendencies plus as augmentation for her depression. I gave rx for this today: adderall IR 38m bid, #60.  Continue duloxetin 30 qd, remeron 30 qhs, and lamotrigine 100 mg bid.  Also continue ativan 233mtid.  4) Chronic pain (neck, primarily): per pt, pain mgmt trying to ween her off of oxycontin.  Per pt, not sure what the plan is after this.  An After Visit Summary was printed and given to the patient.  FOLLOW UP: Return in about  4 months (around 02/19/2015) for routine chronic illness f/u.

## 2014-10-20 NOTE — Progress Notes (Signed)
Pre visit review using our clinic review tool, if applicable. No additional management support is needed unless otherwise documented below in the visit note. 

## 2014-10-20 NOTE — Telephone Encounter (Signed)
Patient is requesting referral to message therapy for pain & anxiety. She will CB to let us know who she would like to go to.

## 2014-11-02 DIAGNOSIS — M5030 Other cervical disc degeneration, unspecified cervical region: Secondary | ICD-10-CM | POA: Diagnosis not present

## 2014-11-02 DIAGNOSIS — M4802 Spinal stenosis, cervical region: Secondary | ICD-10-CM | POA: Diagnosis not present

## 2014-11-02 DIAGNOSIS — M791 Myalgia: Secondary | ICD-10-CM | POA: Diagnosis not present

## 2014-11-02 DIAGNOSIS — M47812 Spondylosis without myelopathy or radiculopathy, cervical region: Secondary | ICD-10-CM | POA: Diagnosis not present

## 2014-11-24 IMAGING — CT CT ABD-PELV W/O CM
2 of 4 series · 16 of 46 positions shown, 18 images · non-contrast
Comparison: 05/10/2010

CLINICAL DATA: Left flank pain. Prior appendectomy and gastric
bypass.

EXAM:
CT ABDOMEN AND PELVIS WITHOUT CONTRAST
TECHNIQUE: Multidetector CT imaging of the abdomen and pelvis was performed
following the standard protocol without intravenous contrast.

[Series 2: renal stone > 200 lbs 5.0 b31f · axial · 0.67mm/px · z∈[-476,-11]mm · 13 of 103 slices shown, 15 images]
[im 5/103  soft-tissue]
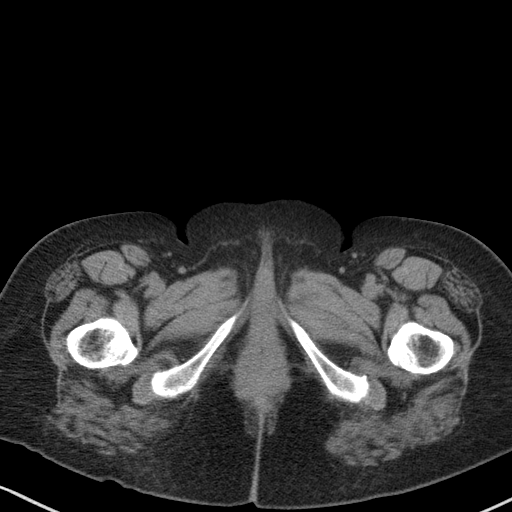
[im 5/103  bone]
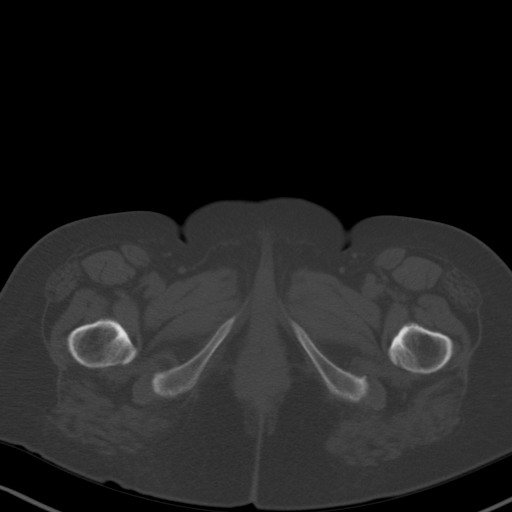
[im 13/103  soft-tissue]
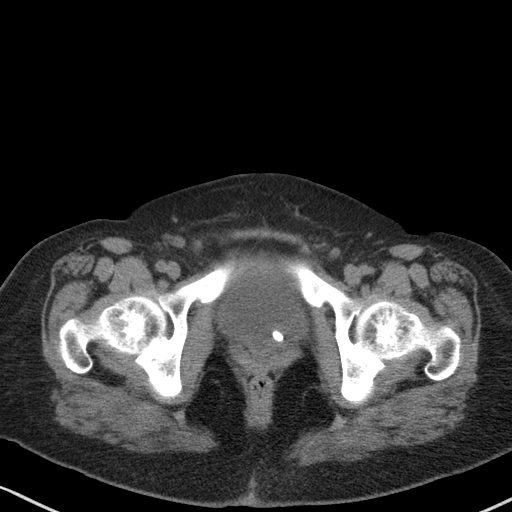
[im 22/103  soft-tissue]
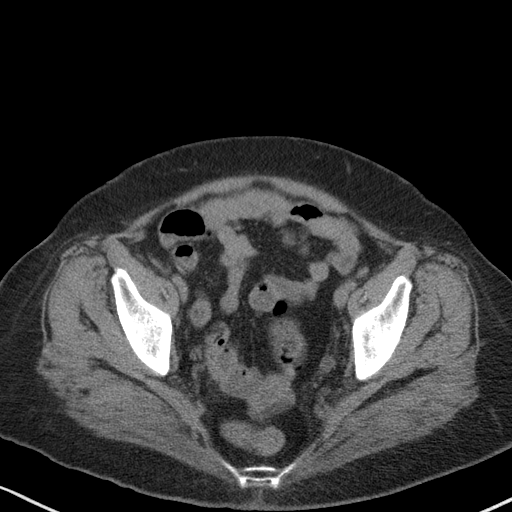
[im 30/103  soft-tissue]
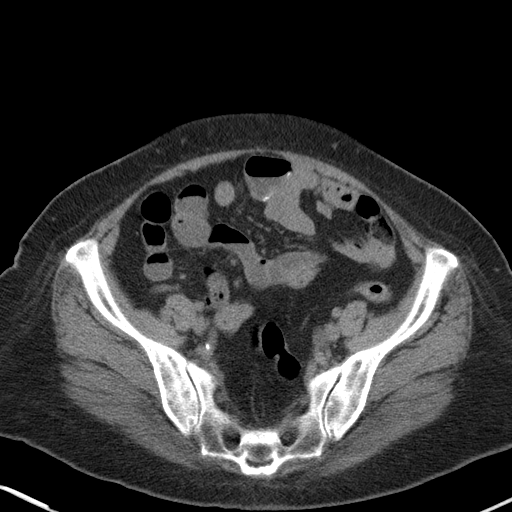
[im 35/103  soft-tissue]
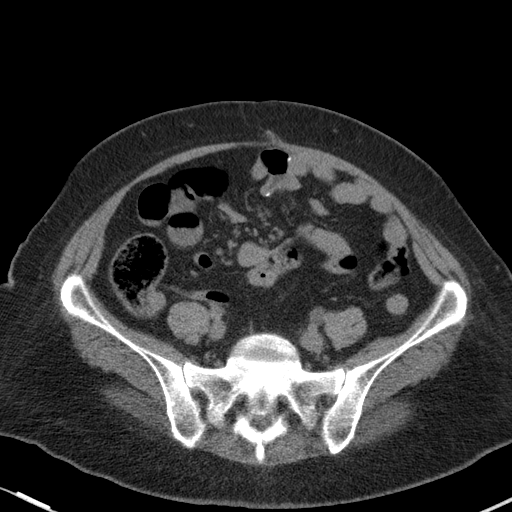
[im 43/103  soft-tissue]
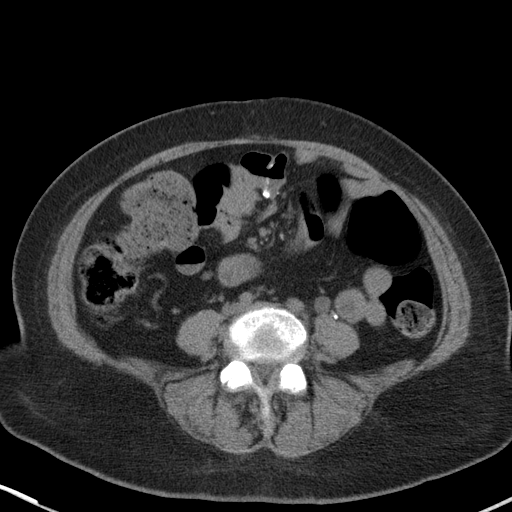
[im 52/103  soft-tissue]
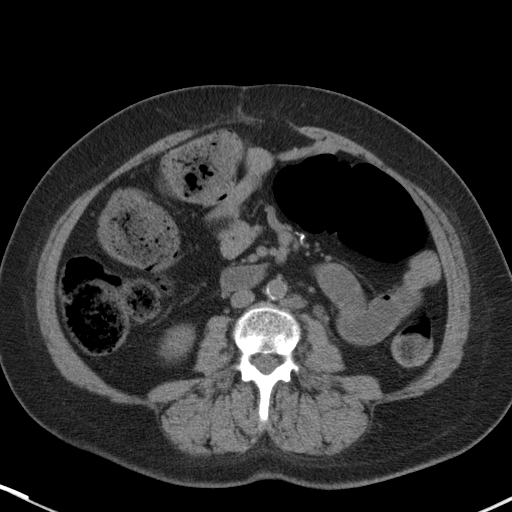
[im 60/103  soft-tissue]
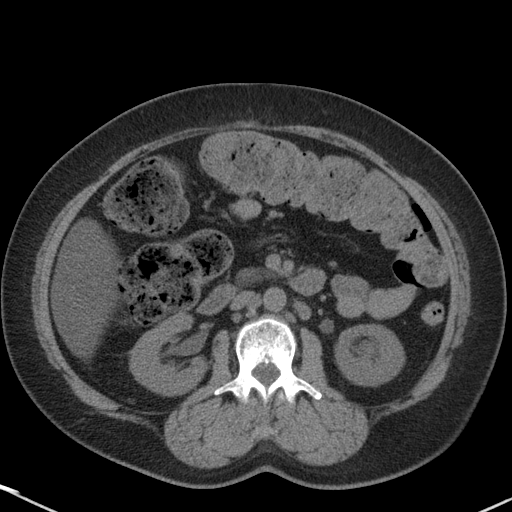
[im 69/103  soft-tissue]
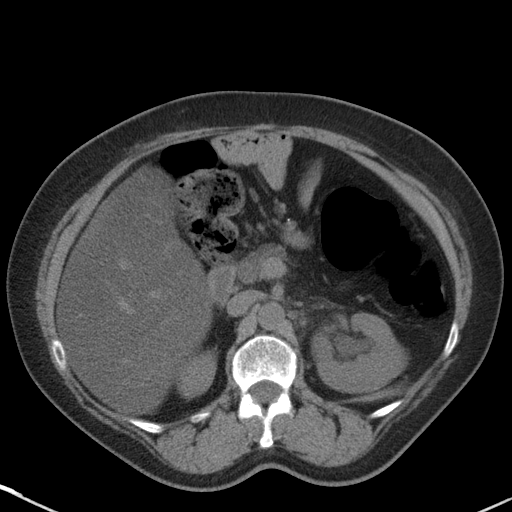
[im 69/103  bone]
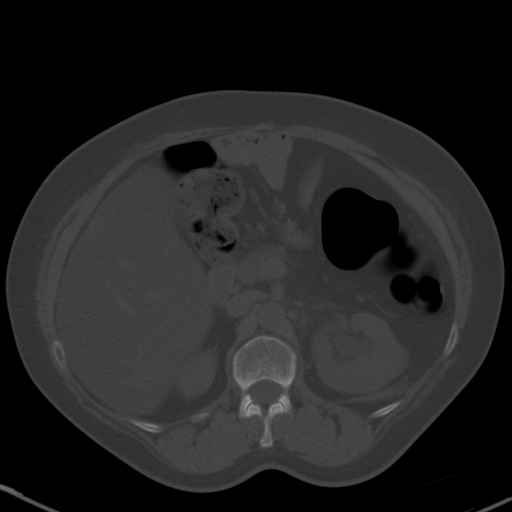
[im 73/103  soft-tissue]
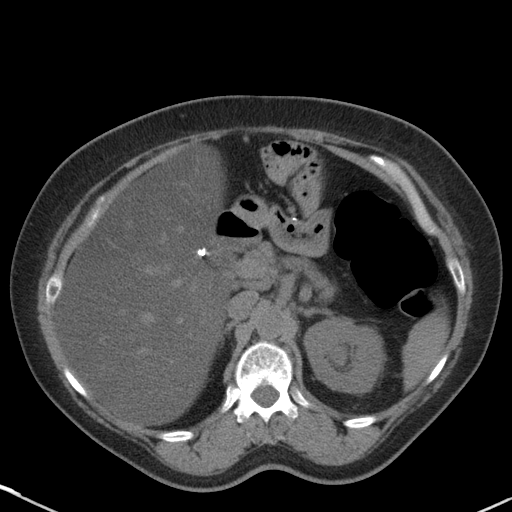
[im 81/103  soft-tissue]
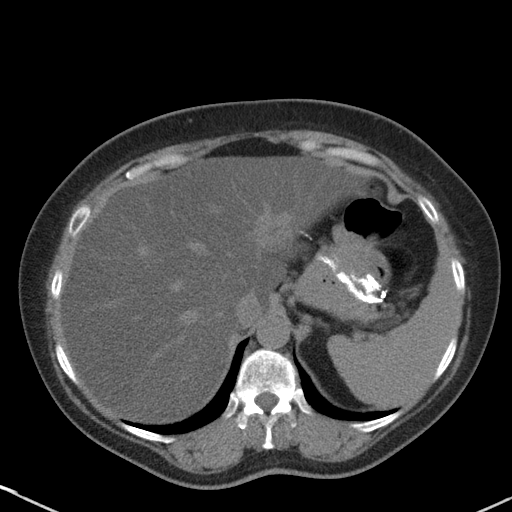
[im 90/103  soft-tissue]
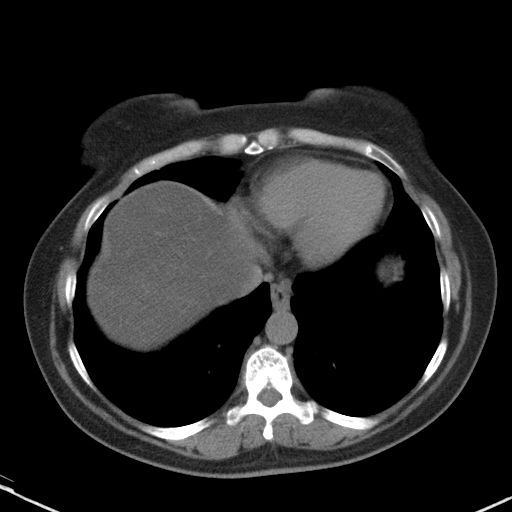
[im 98/103  soft-tissue]
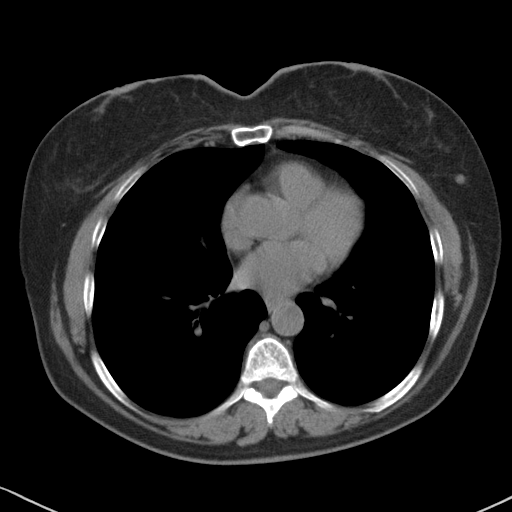

[Series 5: renal stone 3.0 coronal · coronal · 0.69mm/px · 3 of 95 slices shown]
[im 32/95  soft-tissue]
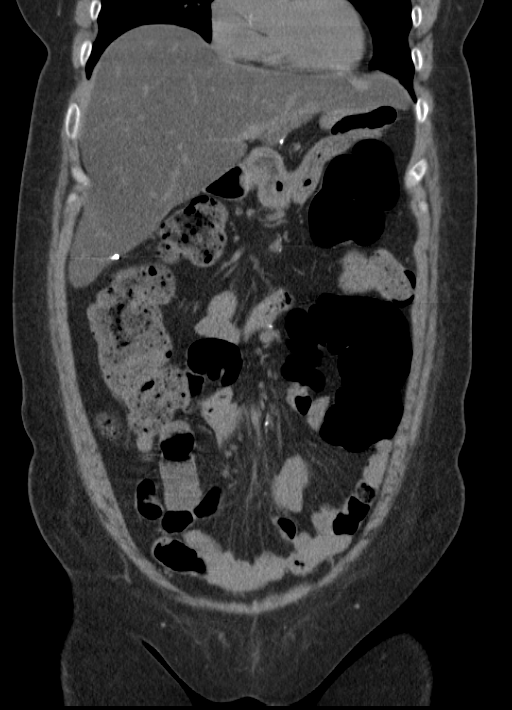
[im 42/95  soft-tissue]
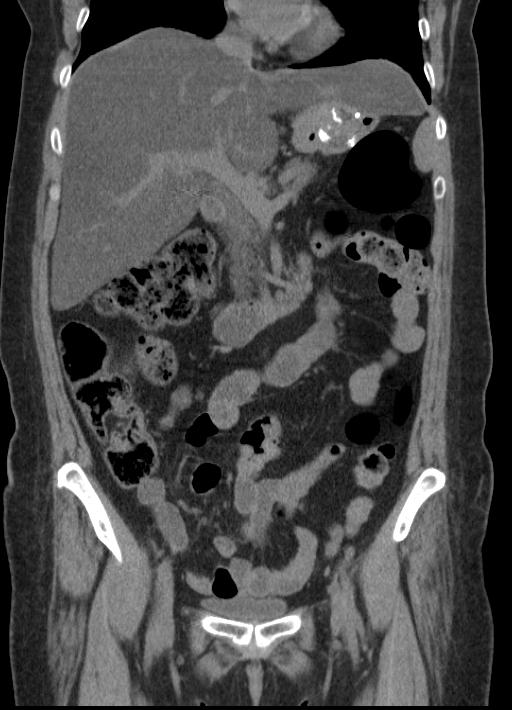
[im 53/95  soft-tissue]
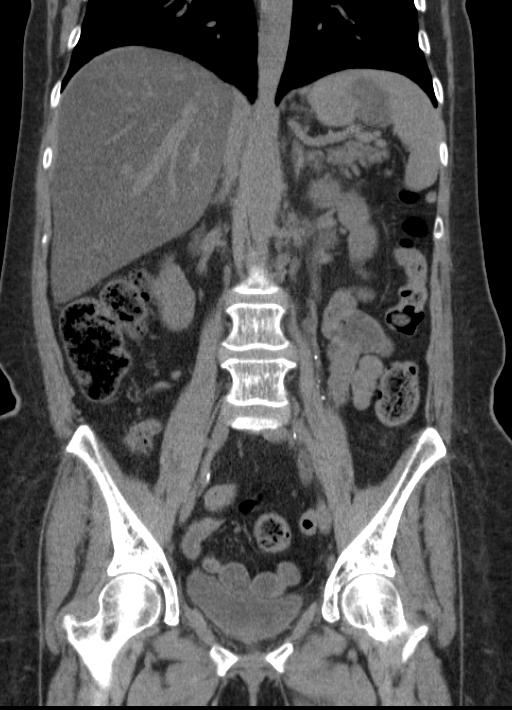

[16 of 46 positions shown; findings below may reference images not displayed]

FINDINGS: Old granulomatous disease noted in the chest and spleen.

Prominent diffuse hepatic steatosis. Spleen and adrenal glands
unremarkable. Gallbladder surgically absent.

Left hydronephrosis noted with hydroureter extending down to a 9 x 5
mm left UVJ calculus. No additional left-sided calculi noted. No
right-sided renal or ureteral calculi. Bladder otherwise
unremarkable.

Postoperative findings from prior gastric bypass. Borderline
prominence of formed stool in the proximal half of the colon.

A left periaortic lymph node measures 0.9 cm in short axis, and
additional upper normal sized retroperitoneal lymph nodes are
observed without overt pathologic adenopathy. Borderline enlarged
peripancreatic lymph nodes are similar to the prior exam. Uterus
absent.
IMPRESSION: 1. Obstructive left UVJ calculus measuring 0.9 x 0.5 cm is
associated with moderate left hydronephrosis and moderate left
hydroureter. No additional renal calculi.
2. Prominent diffuse hepatic steatosis.
3. Chronic upper normal sized retroperitoneal and peripancreatic
lymph nodes.

## 2014-12-13 DIAGNOSIS — R928 Other abnormal and inconclusive findings on diagnostic imaging of breast: Secondary | ICD-10-CM

## 2014-12-13 HISTORY — DX: Other abnormal and inconclusive findings on diagnostic imaging of breast: R92.8

## 2014-12-20 DIAGNOSIS — M4802 Spinal stenosis, cervical region: Secondary | ICD-10-CM | POA: Diagnosis not present

## 2014-12-20 DIAGNOSIS — Z7982 Long term (current) use of aspirin: Secondary | ICD-10-CM | POA: Diagnosis not present

## 2014-12-20 DIAGNOSIS — G8929 Other chronic pain: Secondary | ICD-10-CM | POA: Diagnosis not present

## 2014-12-20 DIAGNOSIS — M791 Myalgia: Secondary | ICD-10-CM | POA: Diagnosis not present

## 2014-12-20 DIAGNOSIS — M47812 Spondylosis without myelopathy or radiculopathy, cervical region: Secondary | ICD-10-CM | POA: Diagnosis not present

## 2014-12-20 DIAGNOSIS — F319 Bipolar disorder, unspecified: Secondary | ICD-10-CM | POA: Diagnosis not present

## 2014-12-20 DIAGNOSIS — M47896 Other spondylosis, lumbar region: Secondary | ICD-10-CM | POA: Diagnosis not present

## 2014-12-20 DIAGNOSIS — Z79899 Other long term (current) drug therapy: Secondary | ICD-10-CM | POA: Diagnosis not present

## 2014-12-20 DIAGNOSIS — M5030 Other cervical disc degeneration, unspecified cervical region: Secondary | ICD-10-CM | POA: Diagnosis not present

## 2014-12-21 ENCOUNTER — Telehealth: Payer: Self-pay | Admitting: *Deleted

## 2014-12-21 ENCOUNTER — Other Ambulatory Visit: Payer: Self-pay | Admitting: Family Medicine

## 2014-12-21 DIAGNOSIS — F909 Attention-deficit hyperactivity disorder, unspecified type: Secondary | ICD-10-CM

## 2014-12-21 DIAGNOSIS — Z79899 Other long term (current) drug therapy: Secondary | ICD-10-CM

## 2014-12-21 DIAGNOSIS — IMO0002 Reserved for concepts with insufficient information to code with codable children: Secondary | ICD-10-CM

## 2014-12-21 DIAGNOSIS — R229 Localized swelling, mass and lump, unspecified: Principal | ICD-10-CM

## 2014-12-21 MED ORDER — AMPHETAMINE-DEXTROAMPHETAMINE 10 MG PO TABS: ORAL_TABLET | ORAL | Status: DC

## 2014-12-21 MED ORDER — CHOLECALCIFEROL 50 MCG (2000 UT) PO CAPS: 1.0000 | ORAL_CAPSULE | Freq: Every day | ORAL | Status: AC

## 2014-12-21 NOTE — Telephone Encounter (Signed)
Left message for pt to call back  °

## 2014-12-21 NOTE — Telephone Encounter (Signed)
Tell her the blood test she is referring to is not indicated, but the mammogram is.  It is the only way to screen for breast cancer (the blood test does not screen for breast cancer, it only helps calculate your risk of getting breast cancer).  We can send her the Hemoccult ICT test to screen for colon cancer BUT if it comes back positive then she will need to proceed with a colonoscopy.-thx

## 2014-12-21 NOTE — Telephone Encounter (Signed)
LOV: 10/20/14 NOV: 02/16/15  RF request for Vitamin D 2,000units daily - this Rx has been sent.  Last written: unknown  RF request for adderall Last written: 10/20/14 #60 w/ 0RF  Please advise. Thanks.

## 2014-12-21 NOTE — Telephone Encounter (Signed)
RF request for Vitamin D 50,000units weekly LOV: 10/20/14 Next ov: 02/16/15 Last written: unknown, per medication list pt is only taking 2,000units daily  Spoke to pt and she stated that she needed her Vitamin D3 2,000units daily and adderall.   I will cancel Rx for Vitamin D 50,000 units.

## 2014-12-21 NOTE — Telephone Encounter (Signed)
Pt advised and voiced understanding.  She stated that she is not able to get her to p/u Rx may just p/u in January. Will go ahead and put up front in case she can get here before then.

## 2014-12-21 NOTE — Telephone Encounter (Signed)
Pt LMOM on 12/21/14 at 9:52am stating that she is scheduled to have her screening mammogram on 12/14/14 at [facility]. She stated that since she has stopped taking the pain medication she has pain every where especially in her left breast. She stated that she would like to see if Dr. Anitra Lauth can order a blood test to see it she has or has the gene for breast cancer. She was unable to remember the name of the blood test. She also wanted to know if she could do the hemoccult test instead of a colonoscopy for the CC screening. Please advise. Thanks.

## 2014-12-23 NOTE — Telephone Encounter (Signed)
Pt advised and voiced understanding.  Hemoccult ICT cards put in mail today.

## 2015-01-03 ENCOUNTER — Other Ambulatory Visit: Payer: Self-pay | Admitting: Family Medicine

## 2015-01-03 ENCOUNTER — Ambulatory Visit (HOSPITAL_COMMUNITY)
Admission: RE | Admit: 2015-01-03 | Discharge: 2015-01-03 | Disposition: A | Discharge status: Home / Self Care | Payer: Commercial Managed Care - HMO | Source: Ambulatory Visit | Attending: Family Medicine | Admitting: Family Medicine

## 2015-01-03 DIAGNOSIS — N63 Unspecified lump in breast: Secondary | ICD-10-CM | POA: Insufficient documentation

## 2015-01-03 DIAGNOSIS — R52 Pain, unspecified: Secondary | ICD-10-CM

## 2015-01-03 DIAGNOSIS — N644 Mastodynia: Secondary | ICD-10-CM | POA: Diagnosis not present

## 2015-01-03 DIAGNOSIS — R229 Localized swelling, mass and lump, unspecified: Secondary | ICD-10-CM

## 2015-01-03 DIAGNOSIS — IMO0002 Reserved for concepts with insufficient information to code with codable children: Secondary | ICD-10-CM

## 2015-01-03 HISTORY — DX: Other abnormal and inconclusive findings on diagnostic imaging of breast: R92.8

## 2015-01-04 ENCOUNTER — Encounter (HOSPITAL_COMMUNITY): Payer: Self-pay

## 2015-01-12 ENCOUNTER — Other Ambulatory Visit: Payer: Self-pay | Admitting: Family Medicine

## 2015-01-12 NOTE — Telephone Encounter (Signed)
RF request for lorazepam LOV: 10/20/14 Next ov: 02/16/15 Last written: 07/13/14 #90 w/ 5RF  Please advise. Thanks.

## 2015-01-12 NOTE — Telephone Encounter (Signed)
Rx faxed

## 2015-01-13 ENCOUNTER — Other Ambulatory Visit: Payer: Self-pay | Admitting: Family Medicine

## 2015-01-17 ENCOUNTER — Other Ambulatory Visit: Payer: Self-pay | Admitting: *Deleted

## 2015-01-17 ENCOUNTER — Other Ambulatory Visit (INDEPENDENT_AMBULATORY_CARE_PROVIDER_SITE_OTHER): Payer: Commercial Managed Care - HMO

## 2015-01-17 ENCOUNTER — Other Ambulatory Visit: Payer: Medicare PPO

## 2015-01-17 DIAGNOSIS — Z1211 Encounter for screening for malignant neoplasm of colon: Secondary | ICD-10-CM

## 2015-01-17 LAB — FECAL OCCULT BLOOD, IMMUNOCHEMICAL: Fecal Occult Bld: NEGATIVE

## 2015-01-30 DIAGNOSIS — M5030 Other cervical disc degeneration, unspecified cervical region: Secondary | ICD-10-CM | POA: Diagnosis not present

## 2015-01-30 DIAGNOSIS — M47896 Other spondylosis, lumbar region: Secondary | ICD-10-CM | POA: Diagnosis not present

## 2015-01-30 DIAGNOSIS — M791 Myalgia: Secondary | ICD-10-CM | POA: Diagnosis not present

## 2015-01-30 DIAGNOSIS — Z79891 Long term (current) use of opiate analgesic: Secondary | ICD-10-CM | POA: Diagnosis not present

## 2015-02-12 DIAGNOSIS — E782 Mixed hyperlipidemia: Secondary | ICD-10-CM

## 2015-02-12 DIAGNOSIS — N182 Chronic kidney disease, stage 2 (mild): Secondary | ICD-10-CM

## 2015-02-12 HISTORY — DX: Chronic kidney disease, stage 2 (mild): N18.2

## 2015-02-12 HISTORY — DX: Mixed hyperlipidemia: E78.2

## 2015-02-16 ENCOUNTER — Encounter: Payer: Self-pay | Admitting: Family Medicine

## 2015-02-16 ENCOUNTER — Ambulatory Visit (INDEPENDENT_AMBULATORY_CARE_PROVIDER_SITE_OTHER): Payer: Commercial Managed Care - HMO | Admitting: Family Medicine

## 2015-02-16 VITALS — BP 123/79 | HR 105 | Temp 98.0°F | Resp 16 | Ht 62.0 in | Wt 162.8 lb

## 2015-02-16 DIAGNOSIS — F909 Attention-deficit hyperactivity disorder, unspecified type: Secondary | ICD-10-CM

## 2015-02-16 DIAGNOSIS — N183 Chronic kidney disease, stage 3 unspecified: Secondary | ICD-10-CM

## 2015-02-16 DIAGNOSIS — F900 Attention-deficit hyperactivity disorder, predominantly inattentive type: Secondary | ICD-10-CM

## 2015-02-16 DIAGNOSIS — E119 Type 2 diabetes mellitus without complications: Secondary | ICD-10-CM

## 2015-02-16 DIAGNOSIS — N2889 Other specified disorders of kidney and ureter: Secondary | ICD-10-CM

## 2015-02-16 DIAGNOSIS — F411 Generalized anxiety disorder: Secondary | ICD-10-CM

## 2015-02-16 DIAGNOSIS — Z79899 Other long term (current) drug therapy: Secondary | ICD-10-CM

## 2015-02-16 DIAGNOSIS — J069 Acute upper respiratory infection, unspecified: Secondary | ICD-10-CM

## 2015-02-16 DIAGNOSIS — B9789 Other viral agents as the cause of diseases classified elsewhere: Secondary | ICD-10-CM

## 2015-02-16 DIAGNOSIS — Z1159 Encounter for screening for other viral diseases: Secondary | ICD-10-CM

## 2015-02-16 LAB — COMPREHENSIVE METABOLIC PANEL
ALT: 20 U/L (ref 0–35)
AST: 37 U/L (ref 0–37)
Albumin: 4.5 g/dL (ref 3.5–5.2)
Alkaline Phosphatase: 101 U/L (ref 39–117)
BUN: 14 mg/dL (ref 6–23)
CALCIUM: 10.1 mg/dL (ref 8.4–10.5)
CHLORIDE: 100 meq/L (ref 96–112)
CO2: 31 meq/L (ref 19–32)
CREATININE: 0.79 mg/dL (ref 0.40–1.20)
GFR: 79.42 mL/min (ref >60.00)
Glucose, Bld: 155 mg/dL — ABNORMAL HIGH (ref 70–99)
Potassium: 4.6 mEq/L (ref 3.5–5.1)
SODIUM: 139 meq/L (ref 135–145)
Total Bilirubin: 0.3 mg/dL (ref 0.2–1.2)
Total Protein: 8 g/dL (ref 6.0–8.3)

## 2015-02-16 LAB — HEMOGLOBIN A1C: HEMOGLOBIN A1C: 7.9 % — AB (ref 4.6–6.5)

## 2015-02-16 MED ORDER — AMPHETAMINE-DEXTROAMPHETAMINE 10 MG PO TABS: ORAL_TABLET | ORAL | Status: DC

## 2015-02-16 NOTE — Progress Notes (Signed)
Pre visit review using our clinic review tool, if applicable. No additional management support is needed unless otherwise documented below in the visit note. 

## 2015-02-16 NOTE — Progress Notes (Signed)
OFFICE VISIT  02/19/2015   CC:  Chief Complaint  Diana [name] presents with  . Follow-up    Pt is not fasting.    HPI:    Diana [name] is a 60 y.o. Caucasian female who presents for 4 mo f/u DM 2, anxiety/bipolar d/o, CRI stage II/ III. Son is living with her in [place] now.  DM: checks pre-meal glucose, says avg is around 150, occ has reading in the 200s. She gives 5-7 units if gluc pre meal is 100-140.  Occ gives up to 10-12 units if pre meal gluc higher. She has stopped levemir b/c she was having too many low cbgs. Still taking metformin but wants to get off this b/c she is afraid of what this med may do to her kidneys.  From a psych standpoint, still with quite a bit of anxiety.  Started smoking again and asks for chantix but I declined to rx this for her today due to excessive risk of this med with pt's with signif psych conditions.  Mood fairly stable, has not established with [facility] in [place] yet.  Says she has had a URI with cough for about 5d, no fevers or SOB or body aches.  Eating/drinking fine.  No n/v/d.      Past Medical History  Diagnosis Date  . Diabetes mellitus     urine protein-Cr ratio nl 09/2010, D.R. Screen neg 10/2009  . Obesity     s/p bariatric surgery  . Hepatic steatosis 2005    ultrasound  . Bipolar disorder ([place])     questionable  . Restless legs syndrome   . Insomnia   . Neurosis, anxiety, panic type   . Chronic fatigue   . Iron deficiency anemia 09/2009    malabsorbtion s/p bariatric surgery  . Vitamin D deficiency 09/2009  . Other B-complex deficiencies   . Nephrolithiasis 11/2012  . Osteopenia 12/2010    Hip T score -2.0.  Spine T score -0.5 (plan to repeat in 2 yrs)  . Colon cancer screening 03/06/2011    iFob 11/2010 through her insurer was negative. iFOB 01/2015 NEG.  . Tobacco dependence   . Chronic nausea 2012/13    Normal upper GI  12/2011 (mild GERD noted +small hiatal hernia)+  . Chronic neck pain     cervical spondylosis on 2013 MRI   . Chronic renal insufficiency, stage II (mild) 02/2015    GFR 70s  . Abnormal mammogram 12/2014    F/u diagnostic mammo and u/s NORMAL--resume annual screening mammography    Past Surgical History  Procedure Laterality Date  . Roux-en-y gastric bypass    . Tonsillectomy    . Abdominal hysterectomy      fibroids, ovaries remain  . Bladder surgery      bladder tack  . Appendectomy    . Hernia repair  2005    mesenteric hernia repair, with lysis of adhesions (presented with SBO)  . Cholecystectomy  04/2010    w/lysis of adhesions (open procedure)--path showed chronic cholecystitis and cholesterol polyp.  Marland Kitchen Epidural block injection  03/2011    Cervical  . Cervical spine surgery  09/2009  . Cystoscopy/retrograde/ureteroscopy/stone extraction with basket  11/2012    Left UVJ stone  . Cystoscopy/retrograde/ureteroscopy/stone extraction with basket Left 11/21/2012    Procedure: CYSTOSCOPY/RETROGRADE/URETEROSCOPY/STONE EXTRACTION WITH BASKET insertion double j stent;  Surgeon: Ailene Rud, MD;  Location: [facility];  Service: Urology;  Laterality: Left;    Outpatient Prescriptions Prior to Visit  Medication Sig Dispense Refill  .  albuterol (VENTOLIN HFA) 108 (90 BASE) MCG/ACT inhaler Inhale 2 puffs into the lungs every 6 (six) hours as needed for wheezing or shortness of breath. 1 Inhaler 1  . B-D UF III MINI PEN NEEDLES 31G X 5 MM MISC Use as directed 100 each 6  . calcium elemental as carbonate (TUMS ULTRA 1000) 400 MG tablet Chew 1,000 mg by mouth daily.      . Cholecalciferol 2000 UNITS CAPS Take 1 capsule (2,000 Units total) by mouth daily. 30 each 11  . cyanocobalamin (,VITAMIN B-12,) 1000 MCG/ML injection Inject 1 mL (1,000 mcg total) into the muscle every 14 (fourteen) days. 10 mL 6  . DULoxetine (CYMBALTA) 30 MG capsule Take 30 mg by mouth 2 (two) times daily.     Marland Kitchen glucose blood (ONE TOUCH ULTRA TEST) test strip Check glucose 4 times per day 100 each 12  . insulin lispro  (HUMALOG KWIKPEN) 100 UNIT/ML KiwkPen 15 U SQ qAC 15 mL 3  . Insulin Pen Needle 31G X 6 MM MISC Use as directed 100 each 6  . LORazepam (ATIVAN) 2 MG tablet TAKE (1) TABLET BY MOUTH EVERY EIGHT HOURS AS NEEDED. 90 tablet 5  . ONE TOUCH LANCETS MISC Use as directed to check Glucose 100 each 3  . oxycodone (OXYCONTIN) 30 MG TB12 Take 30 mg by mouth 3 (three) times daily.    Marland Kitchen tiZANidine (ZANAFLEX) 4 MG tablet Take 1 tablet by mouth Three times a day.    . amphetamine-dextroamphetamine (ADDERALL) 10 MG tablet 1 tab po bid 60 tablet 0  . lamoTRIgine (LAMICTAL) 100 MG tablet Take 1 tablet (100 mg total) by mouth 2 (two) times daily. 60 tablet 4  . metFORMIN (GLUCOPHAGE) 1000 MG tablet Take 1 tablet (1,000 mg total) by mouth 2 (two) times daily with a meal. 60 tablet 6  . mirtazapine (REMERON) 30 MG tablet Take 1 tablet (30 mg total) by mouth at bedtime. 30 tablet 6  . bisacodyl (BISACODYL) 5 MG EC tablet Take 1 tablet (5 mg total) by mouth daily as needed for constipation. (Diana [name] not taking: Reported on 02/16/2015) 30 tablet 0  . Insulin Detemir (LEVEMIR FLEXTOUCH) 100 UNIT/ML Pen Inject 10 Units into the skin at bedtime. (Diana [name] not taking: Reported on 02/16/2015) 15 mL 3   No facility-administered medications prior to visit.    Allergies  Allergen Reactions  . Codeine     REACTION: itching  . Latex     blisters  . Penicillins     REACTION: took large quantities as a child and was told to never take again  . Zofran [Ondansetron Hcl] Hives and Rash    ROS As per HPI  PE: Blood pressure 123/79, pulse 105, temperature 98 F (36.7 C), temperature source Oral, resp. rate 16, height 5' 2"  (1.575 m), weight 162 lb 12 oz (73.823 kg), SpO2 95 %. Gen: Alert, well appearing.  Diana [name] is oriented to person, place, time, and situation. AFFECT: pleasant, lucid thought and speech. No further exam today.  LABS:  Lab Results  Component Value Date   TSH 1.65 07/13/2014   Lab Results  Component  Value Date   WBC 6.8 07/13/2014   HGB 12.4 07/13/2014   HCT 37.6 07/13/2014   MCV 94.9 07/13/2014   PLT 248.0 07/13/2014   Lab Results  Component Value Date   CREATININE 0.79 02/16/2015   BUN 14 02/16/2015   NA 139 02/16/2015   K 4.6 02/16/2015   CL 100 02/16/2015   CO2  31 02/16/2015   Lab Results  Component Value Date   ALT 20 02/16/2015   AST 37 02/16/2015   ALKPHOS 101 02/16/2015   BILITOT 0.3 02/16/2015   Lab Results  Component Value Date   CHOL 80 11/13/2011   Lab Results  Component Value Date   HDL 35* 11/13/2011   Lab Results  Component Value Date   LDLCALC 27 11/13/2011   Lab Results  Component Value Date   TRIG 88 11/13/2011   Lab Results  Component Value Date   CHOLHDL 2.3 11/13/2011   Lab Results  Component Value Date   HGBA1C 7.9* 02/16/2015    IMPRESSION AND PLAN:  1) DM 2: control up and down over the years. HbA1c today.  Stop metformin as per pt request. Remain off levemir for now. Instructions: If pre-meal glucose is 100-150, take 5 units of mealtime insulin. If premeal glucose 151-200, take 7 units of mealtime insulin. If premeal glucose is 201-250, take 9 units. If premeal glucose is 251-300, take 11 units. If premeal glucose is <100, don't give insulin. Pt was reminded of need for updating of her diab retpathy screening.  2) Bipolar d/c + chronic anxiety: no change in meds today. Encouraged pt to seek counseling at [facility] in [place]. For adult ADHD (stable) I did RF adderall 21m bid, #60.    3) Tob dependence: encouraged complete cessation.  Not a chantix candidate.  Wellbutrin candidate due to current psych meds.  Pt insists nicotine replacement attempts in the past have not worked for her.  4) Hep C screening: baby boomer.  Ordered this lab today.  5) Viral URI with cough: symptomatic care discussed.  6) CRI stage II/III: recheck BMET today.  An After Visit Summary was printed and given to the Diana [name].  FOLLOW UP: Return  in about 4 months (around 06/16/2015) for routine chronic illness f/u--fasting.

## 2015-02-16 NOTE — Patient Instructions (Addendum)
If pre-meal glucose is 100-150, take 5 units of mealtime insulin. If premeal glucose 151-200, take 7 units of mealtime insulin. If premeal glucose is 201-250, take 9 units. If premeal glucose is 251-300, take 11 units. If premeal glucose is <100, don't give insulin.  Take otc generic robitussin DM for your cold/cough.

## 2015-02-17 ENCOUNTER — Other Ambulatory Visit: Payer: Self-pay | Admitting: Family Medicine

## 2015-02-17 LAB — HEPATITIS C ANTIBODY: HCV Ab: NEGATIVE

## 2015-02-17 NOTE — Telephone Encounter (Signed)
LOV: 02/16/15 NOV: 06/14/15  RF request for mirtazapine Last written: 07/19/14 #30 w/ 6RF  RF request for lamotrigine Last written: 07/13/14 #60 w/ 4RF

## 2015-03-24 ENCOUNTER — Other Ambulatory Visit: Payer: Self-pay | Admitting: Family Medicine

## 2015-03-24 NOTE — Telephone Encounter (Signed)
RF request for gabapentin LOV: 02/16/15 Next ov: 06/14/15 Last written: 03/17/14 #360 w/ 3RF  Please advise. Thanks.

## 2015-04-20 DIAGNOSIS — Z79891 Long term (current) use of opiate analgesic: Secondary | ICD-10-CM | POA: Diagnosis not present

## 2015-04-27 ENCOUNTER — Encounter: Payer: Self-pay | Admitting: Family Medicine

## 2015-04-27 DIAGNOSIS — Z91138 Patient's unintentional underdosing of medication regimen for other reason: Secondary | ICD-10-CM | POA: Diagnosis not present

## 2015-05-16 ENCOUNTER — Other Ambulatory Visit: Payer: Self-pay | Admitting: Family Medicine

## 2015-05-16 DIAGNOSIS — Z79899 Other long term (current) drug therapy: Secondary | ICD-10-CM

## 2015-05-16 DIAGNOSIS — F909 Attention-deficit hyperactivity disorder, unspecified type: Secondary | ICD-10-CM

## 2015-05-16 NOTE — Telephone Encounter (Signed)
Adderal Rx. [place] are all authorized to pick up Rx since patient doesn't drive much. Can patient get 3 months? She will be traveling out of town for a month.

## 2015-05-17 ENCOUNTER — Other Ambulatory Visit: Payer: Self-pay | Admitting: Family Medicine

## 2015-05-17 DIAGNOSIS — F909 Attention-deficit hyperactivity disorder, unspecified type: Secondary | ICD-10-CM

## 2015-05-17 DIAGNOSIS — Z79899 Other long term (current) drug therapy: Secondary | ICD-10-CM

## 2015-05-17 MED ORDER — AMPHETAMINE-DEXTROAMPHETAMINE 10 MG PO TABS: ORAL_TABLET | ORAL | Status: DC

## 2015-05-17 NOTE — Telephone Encounter (Signed)
RF request for adderall - requesting 3 Rx's LOV: 02/16/15 Next ov: 08/08/15 Last written: 02/16/15 #60 w/ 0RF  Please advise. Thanks.

## 2015-05-18 NOTE — Telephone Encounter (Signed)
Rx ready for p/u at front desk. Left message for pt advisig Rx is ready for p/u.

## 2015-06-12 ENCOUNTER — Telehealth: Payer: Self-pay | Admitting: *Deleted

## 2015-06-12 ENCOUNTER — Other Ambulatory Visit: Payer: Self-pay | Admitting: Family Medicine

## 2015-06-12 DIAGNOSIS — Z23 Encounter for immunization: Secondary | ICD-10-CM

## 2015-06-12 DIAGNOSIS — N183 Chronic kidney disease, stage 3 unspecified: Secondary | ICD-10-CM

## 2015-06-12 DIAGNOSIS — R000 Tachycardia, unspecified: Secondary | ICD-10-CM

## 2015-06-12 DIAGNOSIS — Z79899 Other long term (current) drug therapy: Secondary | ICD-10-CM

## 2015-06-12 DIAGNOSIS — R231 Pallor: Secondary | ICD-10-CM

## 2015-06-12 MED ORDER — INSULIN DETEMIR 100 UNIT/ML FLEXPEN: 10.0000 [IU] | PEN_INJECTOR | Freq: Every day | SUBCUTANEOUS | Status: DC

## 2015-06-12 NOTE — Telephone Encounter (Signed)
Levemir rx sent in.  However, pt is due for routine DM f/u visit sometime in the next 1-2 months.-thx

## 2015-06-12 NOTE — Progress Notes (Signed)
Pt due for routine DM 2 f/u visit.

## 2015-06-12 NOTE — Telephone Encounter (Signed)
Left message for pt to call back  °

## 2015-06-12 NOTE — Telephone Encounter (Signed)
Fax from pharmacy requesting refill for levemir. Note stated that pt is requesting an Rx. This medication is not on pts medication list. Please advise. Thanks.

## 2015-06-14 ENCOUNTER — Ambulatory Visit: Payer: Commercial Managed Care - HMO | Admitting: Family Medicine

## 2015-06-27 NOTE — Telephone Encounter (Signed)
Pt advised and voiced understanding.  She has apt 08/08/15.

## 2015-08-08 ENCOUNTER — Ambulatory Visit (INDEPENDENT_AMBULATORY_CARE_PROVIDER_SITE_OTHER): Payer: Commercial Managed Care - HMO | Admitting: Family Medicine

## 2015-08-08 ENCOUNTER — Encounter: Payer: Self-pay | Admitting: Family Medicine

## 2015-08-08 VITALS — BP 145/91 | HR 105 | Temp 98.4°F | Resp 16 | Ht 62.0 in | Wt 172.5 lb

## 2015-08-08 DIAGNOSIS — N183 Chronic kidney disease, stage 3 unspecified: Secondary | ICD-10-CM

## 2015-08-08 DIAGNOSIS — R7989 Other specified abnormal findings of blood chemistry: Secondary | ICD-10-CM

## 2015-08-08 DIAGNOSIS — K909 Intestinal malabsorption, unspecified: Secondary | ICD-10-CM

## 2015-08-08 DIAGNOSIS — E119 Type 2 diabetes mellitus without complications: Secondary | ICD-10-CM

## 2015-08-08 DIAGNOSIS — G8929 Other chronic pain: Secondary | ICD-10-CM

## 2015-08-08 DIAGNOSIS — F413 Other mixed anxiety disorders: Secondary | ICD-10-CM

## 2015-08-08 DIAGNOSIS — M542 Cervicalgia: Secondary | ICD-10-CM

## 2015-08-08 DIAGNOSIS — F39 Unspecified mood [affective] disorder: Secondary | ICD-10-CM

## 2015-08-08 LAB — CBC WITH DIFFERENTIAL/PLATELET
Basophils Absolute: 0 cells/uL (ref 0–200)
Basophils Relative: 0 %
EOS PCT: 2 %
Eosinophils Absolute: 166 cells/uL (ref 15–500)
HCT: 42.5 % (ref 35.0–45.0)
HEMOGLOBIN: 13.9 g/dL (ref 11.7–15.5)
LYMPHS ABS: 3237 {cells}/uL (ref 850–3900)
Lymphocytes Relative: 39 %
MCH: 31.2 pg (ref 27.0–33.0)
MCHC: 32.7 g/dL (ref 32.0–36.0)
MCV: 95.3 fL (ref 80.0–100.0)
MPV: 9.8 fL (ref 7.5–12.5)
Monocytes Absolute: 166 cells/uL — ABNORMAL LOW (ref 200–950)
Monocytes Relative: 2 %
NEUTROS ABS: 4731 {cells}/uL (ref 1500–7800)
Neutrophils Relative %: 57 %
Platelets: 305 10*3/uL (ref 140–400)
RBC: 4.46 MIL/uL (ref 3.80–5.10)
RDW: 13.3 % (ref 11.0–15.0)
WBC: 8.3 10*3/uL (ref 3.8–10.8)

## 2015-08-08 LAB — MICROALBUMIN / CREATININE URINE RATIO
Creatinine,U: 64.5 mg/dL
Microalb Creat Ratio: 1.1 mg/g (ref 0.0–30.0)

## 2015-08-08 LAB — BASIC METABOLIC PANEL
BUN: 12 mg/dL (ref 6–23)
CALCIUM: 9.9 mg/dL (ref 8.4–10.5)
CO2: 35 meq/L — AB (ref 19–32)
CREATININE: 0.85 mg/dL (ref 0.40–1.20)
Chloride: 97 mEq/L (ref 96–112)
GFR: 72.86 mL/min (ref >60.00)
Glucose, Bld: 221 mg/dL — ABNORMAL HIGH (ref 70–99)
Potassium: 4.6 mEq/L (ref 3.5–5.1)
Sodium: 136 mEq/L (ref 135–145)

## 2015-08-08 LAB — LDL CHOLESTEROL, DIRECT: LDL DIRECT: 174 mg/dL

## 2015-08-08 LAB — LIPID PANEL
CHOL/HDL RATIO: 5
Cholesterol: 246 mg/dL — ABNORMAL HIGH (ref 0–200)
HDL: 50.5 mg/dL (ref >39.00)
NONHDL: 195.03
Triglycerides: 202 mg/dL — ABNORMAL HIGH (ref 0.0–149.0)
VLDL: 40.4 mg/dL — AB (ref 0.0–40.0)

## 2015-08-08 LAB — HEMOGLOBIN A1C: HEMOGLOBIN A1C: 9.5 % — AB (ref 4.6–6.5)

## 2015-08-08 LAB — VITAMIN D 25 HYDROXY (VIT D DEFICIENCY, FRACTURES): VITD: 45.12 ng/mL (ref 30.00–100.00)

## 2015-08-08 MED ORDER — LORAZEPAM 2 MG PO TABS: ORAL_TABLET | ORAL | Status: DC

## 2015-08-08 MED ORDER — DULOXETINE HCL 30 MG PO CPEP: 30.0000 mg | ORAL_CAPSULE | Freq: Two times a day (BID) | ORAL | Status: DC

## 2015-08-08 MED ORDER — GLUCOSE BLOOD VI STRP: ORAL_STRIP | Status: DC

## 2015-08-08 MED ORDER — BLOOD GLUCOSE METER KIT: PACK | Status: DC

## 2015-08-08 NOTE — Patient Instructions (Signed)
Increase your levemir to 30 Units SQ at bedtime

## 2015-08-08 NOTE — Progress Notes (Signed)
Pre visit review using our clinic review tool, if applicable. No additional management support is needed unless otherwise documented below in the visit note. 

## 2015-08-08 NOTE — Progress Notes (Signed)
OFFICE VISIT  08/08/2015   CC:  Chief Complaint  Patient presents with  . Follow-up    Pt is fasting.     HPI:    Patient is a 60 y.o. Caucasian female who presents for f/u DM 2, CRI stage II/III, multiple psych dx (primarily anxiety, question of bipolar II, adult ADD).  Has put on some weight, has been having lots of high readings (sometimes 400-500) so she got back on her levemir, up to 25 U some time recently--not clear when. Taking mealtime insulin via the sliding scale I wrote out for her last visit.   She doesn't eat until around 2 PM.  Struggles to eat diabetic diet.    Still has not made connection with anyone at [facility] in [place].  Gets tearful today when explaining how she can't afford all the copays she has, particularly the insulin.  Collects herself pretty quick, though. Sounds like her life has been even more stressful than usual last few months, says she has been helping care for a family member with chronic illness.    Lots of neck pain lately b/c she had to self-ween off her narcotic pain meds b/c she got d/c'd from her pain mgmt clinic for failing UDS (marijuana).  She says she has been trying to get Dr. Isabelle Course office to refer her to a new pain mgmt clinic but says she thinks his office may be closed. She has not contacted her neck surgeon for follow up.    Past Medical History  Diagnosis Date  . Diabetes mellitus     urine protein-Cr ratio nl 09/2010, D.R. Screen neg 10/2009  . Obesity     s/p bariatric surgery  . Hepatic steatosis 2005    ultrasound  . Bipolar disorder ([place])     questionable  . Restless legs syndrome   . Insomnia   . Neurosis, anxiety, panic type   . Chronic fatigue   . Iron deficiency anemia 09/2009    malabsorbtion s/p bariatric surgery  . Vitamin D deficiency 09/2009  . Other B-complex deficiencies   . Nephrolithiasis 11/2012  . Osteopenia 12/2010    Hip T score -2.0.  Spine T score -0.5 (plan to repeat in 2 yrs)  . Colon cancer  screening 03/06/2011    iFob 11/2010 through her insurer was negative. iFOB 01/2015 NEG.  . Tobacco dependence   . Chronic nausea 2012/13    Normal upper GI  12/2011 (mild GERD noted +small hiatal hernia)+  . Chronic neck pain     cervical spondylosis on 2013 MRI  . Chronic renal insufficiency, stage II (mild) 02/2015    GFR 70s  . Abnormal mammogram 12/2014    F/u diagnostic mammo and u/s NORMAL--resume annual screening mammography  . Chronic pain syndrome     Dr. Francesco Runner at [facility] was her pain mgmt MD and he discharged her from [facility] pain mgmt center b/c she failed a UDS (+marijuana) 04/27/15    Past Surgical History  Procedure Laterality Date  . Roux-en-y gastric bypass    . Tonsillectomy    . Abdominal hysterectomy      fibroids, ovaries remain  . Bladder surgery      bladder tack  . Appendectomy    . Hernia repair  2005    mesenteric hernia repair, with lysis of adhesions (presented with SBO)  . Cholecystectomy  04/2010    w/lysis of adhesions (open procedure)--path showed chronic cholecystitis and cholesterol polyp.  Marland Kitchen Epidural block injection  03/2011  Cervical  . Cervical spine surgery  09/2009  . Cystoscopy/retrograde/ureteroscopy/stone extraction with basket  11/2012    Left UVJ stone  . Cystoscopy/retrograde/ureteroscopy/stone extraction with basket Left 11/21/2012    Procedure: CYSTOSCOPY/RETROGRADE/URETEROSCOPY/STONE EXTRACTION WITH BASKET insertion double j stent;  Surgeon: Ailene Rud, MD;  Location: [facility];  Service: Urology;  Laterality: Left;    Outpatient Prescriptions Prior to Visit  Medication Sig Dispense Refill  . albuterol (VENTOLIN HFA) 108 (90 BASE) MCG/ACT inhaler Inhale 2 puffs into the lungs every 6 (six) hours as needed for wheezing or shortness of breath. 1 Inhaler 1  . amphetamine-dextroamphetamine (ADDERALL) 10 MG tablet 1 tab po bid 60 tablet 0  . B-D UF III MINI PEN NEEDLES 31G X 5 MM MISC Use as directed 100 each 6  . calcium elemental  as carbonate (TUMS ULTRA 1000) 400 MG tablet Chew 1,000 mg by mouth daily.      . Cholecalciferol 2000 UNITS CAPS Take 1 capsule (2,000 Units total) by mouth daily. 30 each 11  . cyanocobalamin (,VITAMIN B-12,) 1000 MCG/ML injection Inject 1 mL (1,000 mcg total) into the muscle every 14 (fourteen) days. 10 mL 6  . gabapentin (NEURONTIN) 300 MG capsule TAKE 4 CAPSULES BY MOUTH AT BEDTIME. 360 capsule 3  . Insulin Detemir (LEVEMIR FLEXTOUCH) 100 UNIT/ML Pen Inject 10 Units into the skin at bedtime. 15 mL 3  . Insulin Pen Needle 31G X 6 MM MISC Use as directed 100 each 6  . lamoTRIgine (LAMICTAL) 100 MG tablet TAKE 1 TABLET BY MOUTH TWICE DAILY. 60 tablet 4  . mirtazapine (REMERON) 30 MG tablet TAKE (1) TABLET BY MOUTH AT BEDTIME. 30 tablet 6  . ONE TOUCH LANCETS MISC Use as directed to check Glucose 100 each 3  . tiZANidine (ZANAFLEX) 4 MG tablet Take 1 tablet by mouth Three times a day.    . DULoxetine (CYMBALTA) 30 MG capsule Take 30 mg by mouth 2 (two) times daily.     Marland Kitchen glucose blood (ONE TOUCH ULTRA TEST) test strip Check glucose 4 times per day 100 each 12  . LORazepam (ATIVAN) 2 MG tablet TAKE (1) TABLET BY MOUTH EVERY EIGHT HOURS AS NEEDED. 90 tablet 5  . insulin lispro (HUMALOG KWIKPEN) 100 UNIT/ML KiwkPen 15 U SQ qAC (Patient not taking: Reported on 08/08/2015) 15 mL 3  . oxycodone (OXYCONTIN) 30 MG TB12 Take 30 mg by mouth 3 (three) times daily. Reported on 08/08/2015     No facility-administered medications prior to visit.    Allergies  Allergen Reactions  . Aspirin Other (See Comments)    Gastric Bypass  . Codeine     REACTION: itching  . Latex     blisters  . Penicillins     REACTION: took large quantities as a child and was told to never take again  . Zofran [Ondansetron Hcl] Hives and Rash    ROS As per HPI  PE: Blood pressure 145/91, pulse 105, temperature 98.4 F (36.9 C), temperature source Oral, resp. rate 16, height 5' 2"  (1.575 m), weight 172 lb 8 oz (78.245  kg), SpO2 91 %. Gen: Alert, well appearing.  Patient is oriented to person, place, time, and situation. AFFECT: lucid though/speech, pleasant.  Somewhat emotional and anxious. No further exam today.  LABS:  Lab Results  Component Value Date   TSH 1.65 07/13/2014   Lab Results  Component Value Date   WBC 6.8 07/13/2014   HGB 12.4 07/13/2014   HCT 37.6 07/13/2014  MCV 94.9 07/13/2014   PLT 248.0 07/13/2014   Lab Results  Component Value Date   CREATININE 0.79 02/16/2015   BUN 14 02/16/2015   NA 139 02/16/2015   K 4.6 02/16/2015   CL 100 02/16/2015   CO2 31 02/16/2015   Lab Results  Component Value Date   ALT 20 02/16/2015   AST 37 02/16/2015   ALKPHOS 101 02/16/2015   BILITOT 0.3 02/16/2015   Lab Results  Component Value Date   CHOL 80 11/13/2011   Lab Results  Component Value Date   HDL 35* 11/13/2011   Lab Results  Component Value Date   LDLCALC 27 11/13/2011   Lab Results  Component Value Date   TRIG 88 11/13/2011   Lab Results  Component Value Date   CHOLHDL 2.3 11/13/2011   Lab Results  Component Value Date   HGBA1C 7.9* 02/16/2015   Lab Results  Component Value Date   MHWKGSUP10 315 11/13/2011   Lab Results  Component Value Date   IRON 80 11/13/2011   TIBC 199* 11/13/2011   IMPRESSION AND PLAN:  1) DM 2: control poor lately. Increase levemir to 30 U qhs. HbA1c and urine microalb/cr today. She is aware of need for updated diabetic eye exam. Check FLP today. Foot exam next visit.  2) Anxiety and mood disorder: she is fairly stable and we'll continue with current regimen. She won't make connection with a counselor with [facility] and is averse to [facility] psychiatrist referral. I handed her a rx for her ativan today, eRx'd duloxetine.  She will continue on lamictal and adderall---new rx's for these were not done today.  3) CRI stage II/III: lytes/cr today.  4) Hx of roux-en-Y gastric bipass---subsequent malabsorption syndrome: will check Vit D and  iron levels today.  5) Chronic neck pain: d/c'd from pain clinic due to failed UDS (marijuana).  She says she was using this for control of anxiety.  I told her that it is very unlikely another pain clinic will accept her as a patient since she was d/c'd from Dr. Isabelle Course for failed UDS.  I recommended she call her neurosurgeon's office for f/u regarding her chronic neck pain.  She asked me for pain medication today and I denied her request. She got upset and began crying, stating how she would be in withdrawal by tonight, etc. I expressed understanding but firmly told her that my position on this question will not change with more conversation.  She said "fine, I'll be in the hospital tonight".    An After Visit Summary was printed and given to the patient.  FOLLOW UP: Return in about 4 months (around 12/08/2015) for routine chronic illness f/u.  Signed:  Crissie Sickles, MD           08/08/2015

## 2015-08-08 NOTE — Addendum Note (Signed)
Addended by: Onalee Hua on: 08/08/2015 03:31 PM   Modules accepted: Orders, Medications

## 2015-08-09 ENCOUNTER — Other Ambulatory Visit: Payer: Self-pay | Admitting: Family Medicine

## 2015-08-09 LAB — IRON AND TIBC
%SAT: 19 % (ref 11–50)
IRON: 87 ug/dL (ref 45–160)
TIBC: 447 ug/dL (ref 250–450)
UIBC: 360 ug/dL (ref 125–400)

## 2015-08-09 LAB — FERRITIN: FERRITIN: 14 ng/mL (ref 10–232)

## 2015-08-14 ENCOUNTER — Encounter (HOSPITAL_COMMUNITY): Payer: Self-pay

## 2015-08-14 ENCOUNTER — Other Ambulatory Visit: Payer: Self-pay | Admitting: *Deleted

## 2015-08-14 ENCOUNTER — Emergency Department (HOSPITAL_COMMUNITY): Payer: Commercial Managed Care - HMO

## 2015-08-14 ENCOUNTER — Emergency Department (HOSPITAL_COMMUNITY)
Admission: EM | Admit: 2015-08-14 | Discharge: 2015-08-14 | Disposition: A | Discharge status: Home / Self Care | Payer: Commercial Managed Care - HMO | Attending: Emergency Medicine | Admitting: Emergency Medicine

## 2015-08-14 DIAGNOSIS — R109 Unspecified abdominal pain: Secondary | ICD-10-CM | POA: Insufficient documentation

## 2015-08-14 DIAGNOSIS — Y939 Activity, unspecified: Secondary | ICD-10-CM | POA: Insufficient documentation

## 2015-08-14 DIAGNOSIS — F17210 Nicotine dependence, cigarettes, uncomplicated: Secondary | ICD-10-CM | POA: Insufficient documentation

## 2015-08-14 DIAGNOSIS — Y92009 Unspecified place in unspecified non-institutional (private) residence as the place of occurrence of the external cause: Secondary | ICD-10-CM | POA: Diagnosis not present

## 2015-08-14 DIAGNOSIS — Y999 Unspecified external cause status: Secondary | ICD-10-CM | POA: Diagnosis not present

## 2015-08-14 DIAGNOSIS — R1010 Upper abdominal pain, unspecified: Secondary | ICD-10-CM | POA: Diagnosis not present

## 2015-08-14 DIAGNOSIS — S2242XA Multiple fractures of ribs, left side, initial encounter for closed fracture: Secondary | ICD-10-CM | POA: Insufficient documentation

## 2015-08-14 DIAGNOSIS — E119 Type 2 diabetes mellitus without complications: Secondary | ICD-10-CM | POA: Insufficient documentation

## 2015-08-14 DIAGNOSIS — F319 Bipolar disorder, unspecified: Secondary | ICD-10-CM | POA: Insufficient documentation

## 2015-08-14 DIAGNOSIS — Z794 Long term (current) use of insulin: Secondary | ICD-10-CM | POA: Insufficient documentation

## 2015-08-14 DIAGNOSIS — S2232XA Fracture of one rib, left side, initial encounter for closed fracture: Secondary | ICD-10-CM | POA: Diagnosis not present

## 2015-08-14 DIAGNOSIS — R55 Syncope and collapse: Secondary | ICD-10-CM | POA: Insufficient documentation

## 2015-08-14 DIAGNOSIS — W19XXXA Unspecified fall, initial encounter: Secondary | ICD-10-CM | POA: Insufficient documentation

## 2015-08-14 DIAGNOSIS — S0990XA Unspecified injury of head, initial encounter: Secondary | ICD-10-CM | POA: Diagnosis not present

## 2015-08-14 DIAGNOSIS — S299XXA Unspecified injury of thorax, initial encounter: Secondary | ICD-10-CM | POA: Diagnosis not present

## 2015-08-14 DIAGNOSIS — I959 Hypotension, unspecified: Secondary | ICD-10-CM

## 2015-08-14 DIAGNOSIS — S199XXA Unspecified injury of neck, initial encounter: Secondary | ICD-10-CM | POA: Diagnosis not present

## 2015-08-14 DIAGNOSIS — R0781 Pleurodynia: Secondary | ICD-10-CM | POA: Diagnosis not present

## 2015-08-14 DIAGNOSIS — N390 Urinary tract infection, site not specified: Secondary | ICD-10-CM | POA: Diagnosis not present

## 2015-08-14 DIAGNOSIS — S3991XA Unspecified injury of abdomen, initial encounter: Secondary | ICD-10-CM | POA: Diagnosis not present

## 2015-08-14 HISTORY — DX: Fracture of one rib, left side, initial encounter for closed fracture: S22.32XA

## 2015-08-14 LAB — COMPREHENSIVE METABOLIC PANEL
ALK PHOS: 91 U/L (ref 38–126)
ALT: 21 U/L (ref 14–54)
ANION GAP: 10 (ref 5–15)
AST: 33 U/L (ref 15–41)
Albumin: 4 g/dL (ref 3.5–5.0)
BILIRUBIN TOTAL: 0.5 mg/dL (ref 0.3–1.2)
BUN: 19 mg/dL (ref 6–20)
CALCIUM: 9.2 mg/dL (ref 8.9–10.3)
CO2: 25 mmol/L (ref 22–32)
Chloride: 100 mmol/L — ABNORMAL LOW (ref 101–111)
Creatinine, Ser: 0.96 mg/dL (ref 0.44–1.00)
Glucose, Bld: 238 mg/dL — ABNORMAL HIGH (ref 65–99)
POTASSIUM: 4 mmol/L (ref 3.5–5.1)
Sodium: 135 mmol/L (ref 135–145)
TOTAL PROTEIN: 7.8 g/dL (ref 6.5–8.1)

## 2015-08-14 LAB — CBC WITH DIFFERENTIAL/PLATELET
BASOS PCT: 0 %
Basophils Absolute: 0 10*3/uL (ref 0.0–0.1)
Eosinophils Absolute: 0.2 10*3/uL (ref 0.0–0.7)
Eosinophils Relative: 1 %
HEMATOCRIT: 43 % (ref 36.0–46.0)
Hemoglobin: 14.6 g/dL (ref 12.0–15.0)
LYMPHS ABS: 3.4 10*3/uL (ref 0.7–4.0)
LYMPHS PCT: 23 %
MCH: 32.4 pg (ref 26.0–34.0)
MCHC: 34 g/dL (ref 30.0–36.0)
MCV: 95.3 fL (ref 78.0–100.0)
MONO ABS: 0.6 10*3/uL (ref 0.1–1.0)
MONOS PCT: 4 %
NEUTROS ABS: 10.6 10*3/uL — AB (ref 1.7–7.7)
Neutrophils Relative %: 72 %
Platelets: 269 10*3/uL (ref 150–400)
RBC: 4.51 MIL/uL (ref 3.87–5.11)
RDW: 13.2 % (ref 11.5–15.5)
WBC: 14.8 10*3/uL — ABNORMAL HIGH (ref 4.0–10.5)

## 2015-08-14 LAB — URINE MICROSCOPIC-ADD ON
RBC / HPF: NONE SEEN RBC/hpf (ref 0–5)
SQUAMOUS EPITHELIAL / LPF: NONE SEEN

## 2015-08-14 LAB — URINALYSIS, ROUTINE W REFLEX MICROSCOPIC
BILIRUBIN URINE: NEGATIVE
GLUCOSE, UA: NEGATIVE mg/dL
HGB URINE DIPSTICK: NEGATIVE
Ketones, ur: NEGATIVE mg/dL
Nitrite: NEGATIVE
PROTEIN: NEGATIVE mg/dL
pH: 6 (ref 5.0–8.0)

## 2015-08-14 LAB — I-STAT CHEM 8, ED
BUN: 18 mg/dL (ref 6–20)
CALCIUM ION: 1.19 mmol/L (ref 1.13–1.30)
Chloride: 100 mmol/L — ABNORMAL LOW (ref 101–111)
Creatinine, Ser: 1 mg/dL (ref 0.44–1.00)
Glucose, Bld: 239 mg/dL — ABNORMAL HIGH (ref 65–99)
HEMATOCRIT: 45 % (ref 36.0–46.0)
HEMOGLOBIN: 15.3 g/dL — AB (ref 12.0–15.0)
Potassium: 4.1 mmol/L (ref 3.5–5.1)
SODIUM: 137 mmol/L (ref 135–145)
TCO2: 26 mmol/L (ref 0–100)

## 2015-08-14 LAB — I-STAT CG4 LACTIC ACID, ED: LACTIC ACID, VENOUS: 1.64 mmol/L (ref 0.5–1.9)

## 2015-08-14 LAB — I-STAT TROPONIN, ED: Troponin i, poc: 0 ng/mL (ref 0.00–0.08)

## 2015-08-14 LAB — TROPONIN I: Troponin I: <0.03 ng/mL (ref <0.03)

## 2015-08-14 MED ORDER — SODIUM CHLORIDE 0.9 % IV BOLUS (SEPSIS)
1000.0000 mL | Freq: Once | INTRAVENOUS | Status: AC
  Administered 2015-08-14: 1000 mL via INTRAVENOUS

## 2015-08-14 MED ORDER — DEXTROSE 5 % IV SOLN
1.0000 g | Freq: Once | INTRAVENOUS | Status: AC
  Administered 2015-08-14: 1 g via INTRAVENOUS
  Filled 2015-08-14: qty 10

## 2015-08-14 MED ORDER — CEPHALEXIN 500 MG PO CAPS: 500.0000 mg | ORAL_CAPSULE | Freq: Four times a day (QID) | ORAL | Status: DC

## 2015-08-14 MED ORDER — FENTANYL CITRATE (PF) 100 MCG/2ML IJ SOLN
50.0000 ug | Freq: Once | INTRAMUSCULAR | Status: AC
  Administered 2015-08-14: 50 ug via INTRAVENOUS
  Filled 2015-08-14: qty 2

## 2015-08-14 MED ORDER — OXYCODONE-ACETAMINOPHEN 5-325 MG PO TABS
2.0000 | ORAL_TABLET | Freq: Once | ORAL | Status: AC
Start: 2015-08-14 — End: 2015-08-14
  Administered 2015-08-14: 2 via ORAL
  Filled 2015-08-14: qty 2

## 2015-08-14 MED ORDER — OXYCODONE-ACETAMINOPHEN 5-325 MG PO TABS: 1.0000 | ORAL_TABLET | ORAL | Status: DC | PRN

## 2015-08-14 MED ORDER — DOCUSATE SODIUM 100 MG PO CAPS: 100.0000 mg | ORAL_CAPSULE | Freq: Two times a day (BID) | ORAL | Status: DC

## 2015-08-14 MED ORDER — NOVOLOG FLEXPEN 100 UNIT/ML ~~LOC~~ SOPN: PEN_INJECTOR | SUBCUTANEOUS | Status: DC

## 2015-08-14 MED ORDER — IOPAMIDOL (ISOVUE-300) INJECTION 61%
100.0000 mL | Freq: Once | INTRAVENOUS | Status: AC | PRN
  Administered 2015-08-14: 100 mL via INTRAVENOUS

## 2015-08-14 NOTE — Discharge Instructions (Signed)
You have 2 left-sided rib fractures. Please use your incentive spirometer every 1-2 hours while awake for the next 2 weeks. Please follow-up with your primary care physician for further pain management for your acute fractures. CT of your head, cervical spine, abdomen and pelvis showed no new injury. He also appeared to have a mild urinary tract infection. We are discharging you on antibiotics for this. Your blood pressure was also low when you arrived in the emergency department. This improved significantly with IV fluids. I recommend you increase your water intake at home and avoid caffeine, alcohol.   Hypotension As your heart beats, it forces blood through your arteries. This force is your blood pressure. If your blood pressure is too low for you to go about your normal activities or to support the organs of your body, you have hypotension. Hypotension is also referred to as low blood pressure. When your blood pressure becomes too low, you may not get enough blood to your brain. As a result, you may feel weak, feel lightheaded, or develop a rapid heart rate. In a more severe case, you may faint. CAUSES Various conditions can cause hypotension. These include:  Blood loss.  Dehydration.  Heart or endocrine problems.  Pregnancy.  Severe infection.  Not having a well-balanced diet filled with needed nutrients.  Severe allergic reactions (anaphylaxis). Some medicines, such as blood pressure medicine or water pills (diuretics), may lower your blood pressure below normal. Sometimes taking too much medicine or taking medicine not as directed can cause hypotension. TREATMENT  Hospitalization is sometimes required for hypotension if fluid or blood replacement is needed, if time is needed for medicines to wear off, or if further monitoring is needed. Treatment might include changing your diet, changing your medicines (including medicines aimed at raising your blood pressure), and use of support  stockings. HOME CARE INSTRUCTIONS   Drink enough fluids to keep your urine clear or pale yellow.  Take your medicines as directed by your health care provider.  Get up slowly from reclining or sitting positions. This gives your blood pressure a chance to adjust.  Wear support stockings as directed by your health care provider.  Maintain a healthy diet by including nutritious food, such as fruits, vegetables, nuts, whole grains, and lean meats. SEEK MEDICAL CARE IF:  You have vomiting or diarrhea.  You have a fever for more than 2-3 days.  You feel more thirsty than usual.  You feel weak and tired. SEEK IMMEDIATE MEDICAL CARE IF:   You have chest pain or a fast or irregular heartbeat.  You have a loss of feeling in some part of your body, or you lose movement in your arms or legs.  You have trouble speaking.  You become sweaty or feel lightheaded.  You faint. MAKE SURE YOU:   Understand these instructions.  Will watch your condition.  Will get help right away if you are not doing well or get worse.   This information is not intended to replace advice given to you by your health care provider. Make sure you discuss any questions you have with your health care provider.   Document Released: 01/28/2005 Document Revised: 11/18/2012 Document Reviewed: 07/31/2012 Elsevier Interactive Patient Education 2016 [employer].  Rib Fracture A rib fracture is a break or crack in one of the bones of the ribs. The ribs are a group of long, curved bones that wrap around your chest and attach to your spine. They protect your lungs and other organs in  the chest cavity. A broken or cracked rib is often painful, but most do not cause other problems. Most rib fractures heal on their own over time. However, rib fractures can be more serious if multiple ribs are broken or if broken ribs move out of place and push against other structures. CAUSES   A direct blow to the chest. For example,  this could happen during contact sports, a car accident, or a fall against a hard object.  Repetitive movements with high force, such as pitching a baseball or having severe coughing spells. SYMPTOMS   Pain when you breathe in or cough.  Pain when someone presses on the injured area. DIAGNOSIS  Your caregiver will perform a physical exam. Various imaging tests may be ordered to confirm the diagnosis and to look for related injuries. These tests may include a chest X-ray, computed tomography (CT), magnetic resonance imaging (MRI), or a bone scan. TREATMENT  Rib fractures usually heal on their own in 1-3 months. The longer healing period is often associated with a continued cough or other aggravating activities. During the healing period, pain control is very important. Medication is usually given to control pain. Hospitalization or surgery may be needed for more severe injuries, such as those in which multiple ribs are broken or the ribs have moved out of place.  HOME CARE INSTRUCTIONS   Avoid strenuous activity and any activities or movements that cause pain. Be careful during activities and avoid bumping the injured rib.  Gradually increase activity as directed by your caregiver.  Only take over-the-counter or prescription medications as directed by your caregiver. Do not take other medications without asking your caregiver first.  Apply ice to the injured area for the first 1-2 days after you have been treated or as directed by your caregiver. Applying ice helps to reduce inflammation and pain.  Put ice in a plastic bag.  Place a towel between your skin and the bag.   Leave the ice on for 15-20 minutes at a time, every 2 hours while you are awake.  Perform deep breathing as directed by your caregiver. This will help prevent pneumonia, which is a common complication of a broken rib. Your caregiver may instruct you to:  Take deep breaths several times a day.  Try to cough several  times a day, holding a pillow against the injured area.  Use a device called an incentive spirometer to practice deep breathing several times a day.  Drink enough fluids to keep your urine clear or pale yellow. This will help you avoid constipation.   Do not wear a rib belt or binder. These restrict breathing, which can lead to pneumonia.  SEEK IMMEDIATE MEDICAL CARE IF:   You have a fever.   You have difficulty breathing or shortness of breath.   You develop a continual cough, or you cough up thick or bloody sputum.  You feel sick to your stomach (nausea), throw up (vomit), or have abdominal pain.   You have worsening pain not controlled with medications.  MAKE SURE YOU:  Understand these instructions.  Will watch your condition.  Will get help right away if you are not doing well or get worse.   This information is not intended to replace advice given to you by your health care provider. Make sure you discuss any questions you have with your health care provider.   Document Released: 01/28/2005 Document Revised: 09/30/2012 Document Reviewed: 04/01/2012 Elsevier Interactive Patient Education 2016 [employer].  Syncope Syncope  is a medical term for fainting or passing out. This means you lose consciousness and drop to the ground. People are generally unconscious for less than 5 minutes. You may have some muscle twitches for up to 15 seconds before waking up and returning to normal. Syncope occurs more often in older adults, but it can happen to anyone. While most causes of syncope are not dangerous, syncope can be a sign of a serious medical problem. It is important to seek medical care.  CAUSES  Syncope is caused by a sudden drop in blood flow to the brain. The specific cause is often not determined. Factors that can bring on syncope include:  Taking medicines that lower blood pressure.  Sudden changes in posture, such as standing up quickly.  Taking more medicine  than prescribed.  Standing in one place for too long.  Seizure disorders.  Dehydration and excessive exposure to heat.  Low blood sugar (hypoglycemia).  Straining to have a bowel movement.  Heart disease, irregular heartbeat, or other circulatory problems.  Fear, emotional distress, seeing blood, or severe pain. SYMPTOMS  Right before fainting, you may:  Feel dizzy or light-headed.  Feel nauseous.  See all white or all black in your field of vision.  Have cold, clammy skin. DIAGNOSIS  Your health care provider will ask about your symptoms, perform a physical exam, and perform an electrocardiogram (ECG) to record the electrical activity of your heart. Your health care provider may also perform other heart or blood tests to determine the cause of your syncope which may include:  Transthoracic echocardiogram (TTE). During echocardiography, sound waves are used to evaluate how blood flows through your heart.  Transesophageal echocardiogram (TEE).  Cardiac monitoring. This allows your health care provider to monitor your heart rate and rhythm in real time.  Holter monitor. This is a portable device that records your heartbeat and can help diagnose heart arrhythmias. It allows your health care provider to track your heart activity for several days, if needed.  Stress tests by exercise or by giving medicine that makes the heart beat faster. TREATMENT  In most cases, no treatment is needed. Depending on the cause of your syncope, your health care provider may recommend changing or stopping some of your medicines. HOME CARE INSTRUCTIONS  Have someone stay with you until you feel stable.  Do not drive, use machinery, or play sports until your health care provider says it is okay.  Keep all follow-up appointments as directed by your health care provider.  Lie down right away if you start feeling like you might faint. Breathe deeply and steadily. Wait until all the symptoms have  passed.  Drink enough fluids to keep your urine clear or pale yellow.  If you are taking blood pressure or heart medicine, get up slowly and take several minutes to sit and then stand. This can reduce dizziness. SEEK IMMEDIATE MEDICAL CARE IF:   You have a severe headache.  You have unusual pain in the chest, abdomen, or back.  You are bleeding from your mouth or rectum, or you have black or tarry stool.  You have an irregular or very fast heartbeat.  You have pain with breathing.  You have repeated fainting or seizure-like jerking during an episode.  You faint when sitting or lying down.  You have confusion.  You have trouble walking.  You have severe weakness.  You have vision problems. If you fainted, call your local emergency services (911 in [place] Do not drive yourself  to the hospital.    This information is not intended to replace advice given to you by your health care provider. Make sure you discuss any questions you have with your health care provider.   Document Released: 01/28/2005 Document Revised: 06/14/2014 Document Reviewed: 03/29/2011 Elsevier Interactive Patient Education 2016 Else[employer] Inc[employer]  Urinary Tract Infection A urinary tract infection (UTI) can occur any place along the urinary tract. The tract includes the kidneys, ureters, bladder, and urethra. A type of germ called bacteria often causes a UTI. UTIs are often helped with antibiotic medicine.  HOME CARE   If given, take antibiotics as told by your doctor. Finish them even if you start to feel better.  Drink enough fluids to keep your pee (urine) clear or pale yellow.  Avoid tea, drinks with caffeine, and bubbly (carbonated) drinks.  Pee often. Avoid holding your pee in for a long time.  Pee before and after having sex (intercourse).  Wipe from front to back after you poop (bowel movement) if you are a woman. Use each tissue only once. GET HELP RIGHT AWAY IF:   You have back pain.  You  have lower belly (abdominal) pain.  You have chills.  You feel sick to your stomach (nauseous).  You throw up (vomit).  Your burning or discomfort with peeing does not go away.  You have a fever.  Your symptoms are not better in 3 days. MAKE SURE YOU:   Understand these instructions.  Will watch your condition.  Will get help right away if you are not doing well or get worse.   This information is not intended to replace advice given to you by your health care provider. Make sure you discuss any questions you have with your health care provider.   Document Released: 07/17/2007 Document Revised: 02/18/2014 Document Reviewed: 08/29/2011 Elsevier Interactive Patient Education [employer].

## 2015-08-14 NOTE — ED Notes (Signed)
I was trying to fix bacon and eggs around 11 pm and I passed out and fell.  Having pain in my left ribs under my breast.
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
## 2015-08-14 NOTE — ED Notes (Signed)
Pt ambulated from room to nurses' station and back with much discomfort.

## 2015-08-14 NOTE — ED Notes (Signed)
Pt drank and tolerated a cup of water.

## 2015-08-16 LAB — URINE CULTURE

## 2015-08-25 ENCOUNTER — Telehealth: Payer: Self-pay | Admitting: *Deleted

## 2015-08-25 ENCOUNTER — Other Ambulatory Visit: Payer: Self-pay | Admitting: Family Medicine

## 2015-08-25 MED ORDER — OXYCODONE-ACETAMINOPHEN 5-325 MG PO TABS: 1.0000 | ORAL_TABLET | Freq: Four times a day (QID) | ORAL | Status: DC | PRN

## 2015-08-25 NOTE — Telephone Encounter (Signed)
Noted  

## 2015-08-25 NOTE — Telephone Encounter (Signed)
Reviewed ED visit and imaging from 08/14/15: left 5th and 6th rib fractures with minimal displacement.  I am willing to treat her short term pain from the rib fractures. I printed rx for oxycodone #60.

## 2015-08-25 NOTE — Telephone Encounter (Signed)
Pt LMOM on 08/25/15 at 9:23am requesting a call back.  Tried calling NA and unable to leave a message.

## 2015-08-25 NOTE — Telephone Encounter (Signed)
Patient spoke with Diane and told her she broke her ribs and needs Rx for pain medication.  Per her medication list, pt was just given oxycodone # 25 on 08/14/15.  Please advise.

## 2015-08-25 NOTE — Telephone Encounter (Signed)
Patient is aware of RX.  She states "Ronalee Belts" will come pick it up for her.  Pt also wanted to make a statement " we do not need to remind her that she has chronic pain, she knows that and when we say things like this Rx is for short term pain management it flares her anxiety."

## 2015-08-26 ENCOUNTER — Other Ambulatory Visit: Payer: Self-pay | Admitting: Family Medicine

## 2015-08-29 ENCOUNTER — Encounter: Payer: Self-pay | Admitting: Family Medicine

## 2015-08-29 ENCOUNTER — Ambulatory Visit (INDEPENDENT_AMBULATORY_CARE_PROVIDER_SITE_OTHER): Payer: Commercial Managed Care - HMO | Admitting: Family Medicine

## 2015-08-29 VITALS — BP 115/67 | HR 100 | Temp 98.5°F | Resp 16 | Ht 62.0 in | Wt 170.2 lb

## 2015-08-29 DIAGNOSIS — S2249XA Multiple fractures of ribs, unspecified side, initial encounter for closed fracture: Secondary | ICD-10-CM | POA: Insufficient documentation

## 2015-08-29 DIAGNOSIS — R55 Syncope and collapse: Secondary | ICD-10-CM | POA: Diagnosis not present

## 2015-08-29 DIAGNOSIS — E118 Type 2 diabetes mellitus with unspecified complications: Secondary | ICD-10-CM

## 2015-08-29 DIAGNOSIS — S2242XD Multiple fractures of ribs, left side, subsequent encounter for fracture with routine healing: Secondary | ICD-10-CM | POA: Diagnosis not present

## 2015-08-29 NOTE — Progress Notes (Signed)
Pre visit review using our clinic review tool, if applicable. No additional management support is needed unless otherwise documented below in the visit note. 

## 2015-08-29 NOTE — Progress Notes (Signed)
OFFICE VISIT  08/29/2015   CC:  Chief Complaint  Patient presents with  . Follow-up    ER visit from 08/14/15   HPI:    Patient is a 60 y.o. Caucasian female who presents accompanied by her husband for [facility] ER f/u from 08/14/15. I have reviewed the ED records entirely. She had an episode of syncope at home.  Sounds like it was about an hour after she took her late-evening meds (which are all sedatives).  In the ED she had left sided CP. CT head/neck/abd/pelv all normal.  Rib x-ray showed minimally displaced fractures of left anterolateral 5th and 6th ribs.  She was rx'd pain med at that time, and I have recently refilled this pain med (percocet 5/325, 1-2 q6h prn, #60 on 08/25/15).   She is slowly improving, although she says she hurts bad still.  Takes 3-4 percocet per day. Using incentive spirometry well per her report.   Eating and drinking well.  The night this happened her glucose was 181.  She was trying to cook herself an egg and felt herself getting lightheaded, feeling like her pain was no more than her usual.  She then woke up on floor, wasn't sure if she hit her head or not.  We discussed her med list today and looked at all the ones that can lead to excessive somnolence and potentially syncope.  She essentially says she can't get off any of these meds at this time.  I asked briefly about her glucoses and she said that the increased dose of levemir has been helping glucoses a lot.  Past Medical History  Diagnosis Date  . Diabetes mellitus     urine protein-Cr ratio nl 09/2010, D.R. Screen neg 10/2009  . Obesity     s/p bariatric surgery  . Hepatic steatosis 2005    ultrasound  . Bipolar disorder ([place])     questionable  . Restless legs syndrome   . Insomnia   . Neurosis, anxiety, panic type   . Chronic fatigue   . Iron deficiency anemia 09/2009    malabsorbtion s/p bariatric surgery  . Vitamin D deficiency 09/2009  . Other B-complex deficiencies   . Nephrolithiasis 11/2012   . Osteopenia 12/2010    Hip T score -2.0.  Spine T score -0.5 (plan to repeat in 2 yrs)  . Colon cancer screening 03/06/2011    iFob 11/2010 through her insurer was negative. iFOB 01/2015 NEG.  . Tobacco dependence   . Chronic nausea 2012/13    Normal upper GI  12/2011 (mild GERD noted +small hiatal hernia)+  . Chronic neck pain     cervical spondylosis on 2013 MRI  . Chronic renal insufficiency, stage II (mild) 02/2015    GFR 70s  . Abnormal mammogram 12/2014    F/u diagnostic mammo and u/s NORMAL--resume annual screening mammography  . Chronic pain syndrome     [facility] Francesco [facility]ner at [facility] was her pain mgmt MD and he discharged her from [facility] pain mgmt center b/c she failed a UDS (+marijuana) 04/27/15  . Fracture of rib of left side 08/14/15    5th and 6th    Past Surgical History  Procedure Laterality Date  . Roux-en-y gastric bypass    . Tonsillectomy    . Abdominal hysterectomy      fibroids, ovaries remain  . Bladder surgery      bladder tack  . Appendectomy    . Hernia repair  2005    mesenteric hernia repair,  with lysis of adhesions (presented with SBO)  . Cholecystectomy  04/2010    w/lysis of adhesions (open procedure)--path showed chronic cholecystitis and cholesterol polyp.  Marland Kitchen Epidural block injection  03/2011    Cervical  . Cervical spine surgery  09/2009  . Cystoscopy/retrograde/ureteroscopy/stone extraction with basket  11/2012    Left UVJ stone  . Cystoscopy/retrograde/ureteroscopy/stone extraction with basket Left 11/21/2012    Procedure: CYSTOSCOPY/RETROGRADE/URETEROSCOPY/STONE EXTRACTION WITH BASKET insertion double j stent;  Surgeon: Ailene Rud, MD;  Location: [facility];  Service: Urology;  Laterality: Left;    Outpatient Prescriptions Prior to Visit  Medication Sig Dispense Refill  . albuterol (VENTOLIN HFA) 108 (90 BASE) MCG/ACT inhaler Inhale 2 puffs into the lungs every 6 (six) hours as needed for wheezing or shortness of breath. 1 Inhaler 1  .  amphetamine-dextroamphetamine (ADDERALL) 10 MG tablet 1 tab po bid 60 tablet 0  . B-D UF III MINI PEN NEEDLES 31G X 5 MM MISC USE AS DIRECTED. 100 each 6  . blood glucose meter kit and supplies Use up to four times daily as directed. 1 each 0  . calcium elemental as carbonate (TUMS ULTRA 1000) 400 MG tablet Chew 1,000 mg by mouth daily.      . Cholecalciferol 2000 UNITS CAPS Take 1 capsule (2,000 Units total) by mouth daily. 30 each 11  . cyanocobalamin (,VITAMIN B-12,) 1000 MCG/ML injection Inject 1 mL (1,000 mcg total) into the muscle every 14 (fourteen) days. 10 mL 6  . docusate sodium (COLACE) 100 MG capsule Take 1 capsule (100 mg total) by mouth every 12 (twelve) hours. 60 capsule 0  . DULoxetine (CYMBALTA) 30 MG capsule Take 1 capsule (30 mg total) by mouth 2 (two) times daily. 60 capsule 6  . gabapentin (NEURONTIN) 300 MG capsule TAKE 4 CAPSULES BY MOUTH AT BEDTIME. 360 capsule 3  . Insulin Detemir (LEVEMIR FLEXTOUCH) 100 UNIT/ML Pen Inject 10 Units into the skin at bedtime. 15 mL 3  . Insulin Pen Needle 31G X 6 MM MISC Use as directed 100 each 6  . lamoTRIgine (LAMICTAL) 100 MG tablet TAKE 1 TABLET BY MOUTH TWICE DAILY. 60 tablet 4  . LORazepam (ATIVAN) 2 MG tablet TAKE (1) TABLET BY MOUTH EVERY EIGHT HOURS AS NEEDED. 90 tablet 5  . mirtazapine (REMERON) 30 MG tablet TAKE (1) TABLET BY MOUTH AT BEDTIME. 30 tablet 6  . NOVOLOG FLEXPEN 100 UNIT/ML FlexPen SS 15 mL 6  . oxyCODONE-acetaminophen (PERCOCET/ROXICET) 5-325 MG tablet Take 1-2 tablets by mouth every 6 (six) hours as needed. 60 tablet 0  . tiZANidine (ZANAFLEX) 4 MG tablet Take 1 tablet by mouth Three times a day.    . cephALEXin (KEFLEX) 500 MG capsule Take 1 capsule (500 mg total) by mouth 4 (four) times daily. (Patient not taking: Reported on 08/29/2015) 28 capsule 0   No facility-administered medications prior to visit.    Allergies  Allergen Reactions  . Aspirin Other (See Comments)    Gastric Bypass  . Codeine      REACTION: itching  . Latex     blisters  . Penicillins     REACTION: took large quantities as a child and was told to never take again  . Zofran [Ondansetron Hcl] Hives and Rash    ROS As per HPI  PE: Blood pressure 115/67, pulse 100, temperature 98.5 F (36.9 C), temperature source Oral, resp. rate 16, height _0  (1.575 m), weight 170 lb 4 oz (77.225 kg), SpO2 91 %. Gen: Alert,  well appearing.  Patient is oriented to person, place, time, and situation. AFFECT: pleasant, lucid thought and speech. CV: RRR, no m/r/g.   LUNGS: CTA bilat, nonlabored resps, good aeration in all lung fields. TTP all over L chest wall. No chest wall bruising.   LABS:  Lab Results  Component Value Date   TSH 1.65 07/13/2014   Lab Results  Component Value Date   WBC 14.8* 08/14/2015   HGB 15.3* 08/14/2015   HCT 45.0 08/14/2015   MCV 95.3 08/14/2015   PLT 269 08/14/2015   Lab Results  Component Value Date   CREATININE 1.00 08/14/2015   BUN 18 08/14/2015   NA 137 08/14/2015   K 4.1 08/14/2015   CL 100* 08/14/2015   CO2 25 08/14/2015   Lab Results  Component Value Date   ALT 21 08/14/2015   AST 33 08/14/2015   ALKPHOS 91 08/14/2015   BILITOT 0.5 08/14/2015   Lab Results  Component Value Date   CHOL 246* 08/08/2015   Lab Results  Component Value Date   HDL 50.50 08/08/2015   Lab Results  Component Value Date   LDLCALC 27 11/13/2011   Lab Results  Component Value Date   TRIG 202.0* 08/08/2015   Lab Results  Component Value Date   CHOLHDL 5 08/08/2015   Lab Results  Component Value Date   HGBA1C 9.5* 08/08/2015    IMPRESSION AND PLAN:  1) Left 5th and 6th rib fractures: healing appropriately based on patient's symptoms and exam today. No new med given to pt for this today. She is to continue incentive spirometry.  Will need to be aggressive in getting her to ween off narcotic pain med due to her hx of chronic pain and no longer having any tx for chronic pain.  2)  Syncope: i think she is too heavily medicated with sedative-type medication. However, as stated in HPI, it is very difficult to get her to try to ween off any of these. I told her to think about this and in a month when I see her back perhaps she'll be able to pick one she can start to ween off of.  We'll work on this slowly.  3) DM 2, poor control. We have increased her levemir to 30 U qhs and she continues novolog SS at meals.  An After Visit Summary was printed and given to the patient.  FOLLOW UP: Return in about 4 weeks (around 09/26/2015) for f/u rib fractures and sedative meds (30 min).  Signed:  Crissie Sickles, MD           08/29/2015

## 2015-09-29 ENCOUNTER — Encounter: Payer: Self-pay | Admitting: Family Medicine

## 2015-09-29 ENCOUNTER — Ambulatory Visit (INDEPENDENT_AMBULATORY_CARE_PROVIDER_SITE_OTHER): Payer: Commercial Managed Care - HMO | Admitting: Family Medicine

## 2015-09-29 VITALS — BP 118/75 | HR 95 | Temp 98.3°F | Resp 16 | Ht 62.0 in | Wt 171.5 lb

## 2015-09-29 DIAGNOSIS — E118 Type 2 diabetes mellitus with unspecified complications: Secondary | ICD-10-CM | POA: Diagnosis not present

## 2015-09-29 DIAGNOSIS — Z794 Long term (current) use of insulin: Secondary | ICD-10-CM

## 2015-09-29 DIAGNOSIS — S2242XD Multiple fractures of ribs, left side, subsequent encounter for fracture with routine healing: Secondary | ICD-10-CM | POA: Diagnosis not present

## 2015-09-29 DIAGNOSIS — Z79899 Other long term (current) drug therapy: Secondary | ICD-10-CM

## 2015-09-29 MED ORDER — OXYCODONE-ACETAMINOPHEN 5-325 MG PO TABS: 1.0000 | ORAL_TABLET | Freq: Four times a day (QID) | ORAL | 0 refills | Status: DC | PRN

## 2015-09-29 NOTE — Progress Notes (Signed)
OFFICE VISIT  09/29/2015   CC:  Chief Complaint  Patient presents with  . Follow-up    rib fracture and sedative   HPI:    Patient is a 60 y.o. Caucasian female who presents for follow up of left sided rib fractures, poorly controlled DM 2, and polypharmacy.   She has cut back her zanaflex to bid instead of tid, which is a start to helping her polypharm with sedative meds. She has not considered cutting back on any others at this time.  Pain in ribs still moderate and definitely has her feeling impaired.  She has managed to make her 28 percocet from 08/25/15 stretch out to today.  Still complaining of chronic neck pain going down left shoulder and arm.  The longer she is off of her chronic pain med regimen, the more she starts to feel anxiety, headaches, and her chronic pain.  Also intermittent brief dizzy spells, mostly when going from sitting to standing position.  No syncope.  Says glucoses not well controlled despite taking 30 U levemir and anywhere from 11-15 U novolog at mealtimes. She admits that since being off pain meds regularly she has had the desire to eat more and sugars have been reflecting this. Feet: has some distal foot numbness feeling chronically on R.  None on left.  No pain in feet except where she has an ingrown toenail on great toe on right foot.  No hx of foot ulcer.  Past Medical History:  Diagnosis Date  . Abnormal mammogram 12/2014   F/u diagnostic mammo and u/s NORMAL--resume annual screening mammography  . Bipolar disorder ([place])    questionable  . Chronic fatigue   . Chronic nausea 2012/13   Normal upper GI  12/2011 (mild GERD noted +small hiatal hernia)+  . Chronic neck pain    cervical spondylosis on 2013 MRI  . Chronic pain syndrome    Dr. Francesco Runner at [facility] was her pain mgmt MD and he discharged her from [facility] pain mgmt center b/c she failed a UDS (+marijuana) 04/27/15  . Chronic renal insufficiency, stage II (mild) 02/2015   GFR 70s  . Colon cancer  screening 03/06/2011   iFob 11/2010 through her insurer was negative. iFOB 01/2015 NEG.  . Diabetes mellitus    urine protein-Cr ratio nl 09/2010, D.R. Screen neg 10/2009  . Fracture of rib of left side 08/14/15   5th and 6th  . Hepatic steatosis 2005   ultrasound  . Insomnia   . Iron deficiency anemia 09/2009   malabsorbtion s/p bariatric surgery  . Nephrolithiasis 11/2012  . Neurosis, anxiety, panic type   . Obesity    s/p bariatric surgery  . Osteopenia 12/2010   Hip T score -2.0.  Spine T score -0.5 (plan to repeat in 2 yrs)  . Other B-complex deficiencies   . Restless legs syndrome   . Tobacco dependence   . Vitamin D deficiency 09/2009    Past Surgical History:  Procedure Laterality Date  . ABDOMINAL HYSTERECTOMY     fibroids, ovaries remain  . APPENDECTOMY    . BLADDER SURGERY     bladder tack  . CERVICAL SPINE SURGERY  09/2009  . CHOLECYSTECTOMY  04/2010   w/lysis of adhesions (open procedure)--path showed chronic cholecystitis and cholesterol polyp.  . CYSTOSCOPY/RETROGRADE/URETEROSCOPY/STONE EXTRACTION WITH BASKET  11/2012   Left UVJ stone  . CYSTOSCOPY/RETROGRADE/URETEROSCOPY/STONE EXTRACTION WITH BASKET Left 11/21/2012   Procedure: CYSTOSCOPY/RETROGRADE/URETEROSCOPY/STONE EXTRACTION WITH BASKET insertion double j stent;  Surgeon: Ailene Rud, MD;  Location: [facility];  Service: Urology;  Laterality: Left;  . EPIDURAL BLOCK INJECTION  03/2011   Cervical  . HERNIA REPAIR  2005   mesenteric hernia repair, with lysis of adhesions (presented with SBO)  . ROUX-EN-Y GASTRIC BYPASS    . TONSILLECTOMY      Outpatient Medications Prior to Visit  Medication Sig Dispense Refill  . albuterol (VENTOLIN HFA) 108 (90 BASE) MCG/ACT inhaler Inhale 2 puffs into the lungs every 6 (six) hours as needed for wheezing or shortness of breath. 1 Inhaler 1  . amphetamine-dextroamphetamine (ADDERALL) 10 MG tablet 1 tab po bid 60 tablet 0  . B-D UF III MINI PEN NEEDLES 31G X 5 MM MISC USE  AS DIRECTED. 100 each 6  . blood glucose meter kit and supplies Use up to four times daily as directed. 1 each 0  . calcium elemental as carbonate (TUMS ULTRA 1000) 400 MG tablet Chew 1,000 mg by mouth daily.      . Cholecalciferol 2000 UNITS CAPS Take 1 capsule (2,000 Units total) by mouth daily. 30 each 11  . docusate sodium (COLACE) 100 MG capsule Take 1 capsule (100 mg total) by mouth every 12 (twelve) hours. 60 capsule 0  . DULoxetine (CYMBALTA) 30 MG capsule Take 1 capsule (30 mg total) by mouth 2 (two) times daily. 60 capsule 6  . gabapentin (NEURONTIN) 300 MG capsule TAKE 4 CAPSULES BY MOUTH AT BEDTIME. 360 capsule 3  . Insulin Detemir (LEVEMIR FLEXTOUCH) 100 UNIT/ML Pen Inject 10 Units into the skin at bedtime. (Patient taking differently: Inject 30 Units into the skin at bedtime. ) 15 mL 3  . Insulin Pen Needle 31G X 6 MM MISC Use as directed 100 each 6  . lamoTRIgine (LAMICTAL) 100 MG tablet TAKE 1 TABLET BY MOUTH TWICE DAILY. 60 tablet 4  . LORazepam (ATIVAN) 2 MG tablet TAKE (1) TABLET BY MOUTH EVERY EIGHT HOURS AS NEEDED. 90 tablet 5  . mirtazapine (REMERON) 30 MG tablet TAKE (1) TABLET BY MOUTH AT BEDTIME. 30 tablet 6  . NOVOLOG FLEXPEN 100 UNIT/ML FlexPen SS 15 mL 6  . tiZANidine (ZANAFLEX) 4 MG tablet Take 1 tablet by mouth 2 (two) times daily.     Marland Kitchen oxyCODONE-acetaminophen (PERCOCET/ROXICET) 5-325 MG tablet Take 1-2 tablets by mouth every 6 (six) hours as needed. 60 tablet 0  . cyanocobalamin (,VITAMIN B-12,) 1000 MCG/ML injection Inject 1 mL (1,000 mcg total) into the muscle every 14 (fourteen) days. (Patient not taking: Reported on 09/29/2015) 10 mL 6   No facility-administered medications prior to visit.     Allergies  Allergen Reactions  . Aspirin Other (See Comments)    Gastric Bypass  . Codeine     REACTION: itching  . Latex     blisters  . Penicillins     REACTION: took large quantities as a child and was told to never take again  . Zofran [Ondansetron Hcl] Hives  and Rash    ROS As per HPI  PE: Blood pressure 118/75, pulse 95, temperature 98.3 F (36.8 C), temperature source Oral, resp. rate 16, height 5' 2"  (1.575 m), weight 171 lb 8 oz (77.8 kg), SpO2 95 %. Gen: Alert, well appearing.  Patient is oriented to person, place, time, and situation. AFFECT: pleasant, lucid thought and speech. Foot exam -  no swelling, tenderness or skin or vascular lesions. Color and temperature is normal. Sensation is intact. Peripheral pulses are palpable. Toenails are normal except R great toe has ingrowing region laterally  w/out any swelling or exudate---just a bit TTP.   LABS:  Lab Results  Component Value Date   TSH 1.65 07/13/2014   Lab Results  Component Value Date   WBC 14.8 (H) 08/14/2015   HGB 15.3 (H) 08/14/2015   HCT 45.0 08/14/2015   MCV 95.3 08/14/2015   PLT 269 08/14/2015   Lab Results  Component Value Date   CREATININE 1.00 08/14/2015   BUN 18 08/14/2015   NA 137 08/14/2015   K 4.1 08/14/2015   CL 100 (L) 08/14/2015   CO2 25 08/14/2015   Lab Results  Component Value Date   ALT 21 08/14/2015   AST 33 08/14/2015   ALKPHOS 91 08/14/2015   BILITOT 0.5 08/14/2015   Lab Results  Component Value Date   CHOL 246 (H) 08/08/2015   Lab Results  Component Value Date   HDL 50.50 08/08/2015   Lab Results  Component Value Date   LDLCALC 27 11/13/2011   Lab Results  Component Value Date   TRIG 202.0 (H) 08/08/2015   Lab Results  Component Value Date   CHOLHDL 5 08/08/2015   Lab Results  Component Value Date   HGBA1C 9.5 (H) 08/08/2015   IMPRESSION AND PLAN:  1) Left sided rib fractures: pain gradually improving, but will go ahead and rx another #60 Percocet 5/325 for pt to use as sparingly as she can.    2) DM 2, poor control. Feet exam ok today, but she has sx's of PN in R distal foot. Increase levemir to 34 U qhs and continue novolog per her sliding scale at mealtimes. Try to eat a better diabetic diet. Next Hba1c will  be at f/u in 6 wks or so.  3) Polypharmacy:  Off her chronic narcotic regimen that the pain mgmt MD had her on. Zanaflex is down by one dose per day. I asked her to continue to consider what other med she may be willing to cut back on or stop in hopes of decreasing her tendency toward sedation/somnolence/falls, etc.  An After Visit Summary was printed and given to the patient.  FOLLOW UP: Return in about 6 weeks (around 11/10/2015) for routine chronic illness f/u.  Signed:  Crissie Sickles, MD           09/29/2015

## 2015-09-29 NOTE — Patient Instructions (Signed)
Increase your levemir to 34 Units every night.

## 2015-09-29 NOTE — Progress Notes (Signed)
Pre visit review using our clinic review tool, if applicable. No additional management support is needed unless otherwise documented below in the visit note. 

## 2015-10-04 ENCOUNTER — Other Ambulatory Visit: Payer: Self-pay | Admitting: *Deleted

## 2015-10-04 DIAGNOSIS — R231 Pallor: Secondary | ICD-10-CM

## 2015-10-04 DIAGNOSIS — Z23 Encounter for immunization: Secondary | ICD-10-CM

## 2015-10-04 DIAGNOSIS — N183 Chronic kidney disease, stage 3 unspecified: Secondary | ICD-10-CM

## 2015-10-04 DIAGNOSIS — Z79899 Other long term (current) drug therapy: Secondary | ICD-10-CM

## 2015-10-04 DIAGNOSIS — R000 Tachycardia, unspecified: Secondary | ICD-10-CM

## 2015-10-04 MED ORDER — INSULIN DETEMIR 100 UNIT/ML FLEXPEN: 34.0000 [IU] | PEN_INJECTOR | Freq: Every day | SUBCUTANEOUS | 3 refills | Status: DC

## 2015-10-04 NOTE — Telephone Encounter (Signed)
Received fax from [place] requesting clarification on sig for levemir. Pt stated that she is now taking 34units, current Rx is for 10units. Per last office note Rx was increased to 34units at bedtime. Will send new Rx with updated sig.

## 2015-11-10 ENCOUNTER — Encounter: Payer: Self-pay | Admitting: Family Medicine

## 2015-11-10 ENCOUNTER — Ambulatory Visit (INDEPENDENT_AMBULATORY_CARE_PROVIDER_SITE_OTHER): Payer: Commercial Managed Care - HMO | Admitting: Family Medicine

## 2015-11-10 VITALS — BP 129/84 | HR 108 | Temp 97.9°F | Resp 18 | Wt 175.8 lb

## 2015-11-10 DIAGNOSIS — R5382 Chronic fatigue, unspecified: Secondary | ICD-10-CM

## 2015-11-10 DIAGNOSIS — Z23 Encounter for immunization: Secondary | ICD-10-CM

## 2015-11-10 DIAGNOSIS — E785 Hyperlipidemia, unspecified: Secondary | ICD-10-CM | POA: Diagnosis not present

## 2015-11-10 DIAGNOSIS — N189 Chronic kidney disease, unspecified: Secondary | ICD-10-CM

## 2015-11-10 DIAGNOSIS — Z79899 Other long term (current) drug therapy: Secondary | ICD-10-CM

## 2015-11-10 DIAGNOSIS — S2242XD Multiple fractures of ribs, left side, subsequent encounter for fracture with routine healing: Secondary | ICD-10-CM

## 2015-11-10 DIAGNOSIS — Z794 Long term (current) use of insulin: Secondary | ICD-10-CM

## 2015-11-10 DIAGNOSIS — R000 Tachycardia, unspecified: Secondary | ICD-10-CM

## 2015-11-10 DIAGNOSIS — N183 Chronic kidney disease, stage 3 unspecified: Secondary | ICD-10-CM

## 2015-11-10 DIAGNOSIS — E118 Type 2 diabetes mellitus with unspecified complications: Secondary | ICD-10-CM | POA: Diagnosis not present

## 2015-11-10 DIAGNOSIS — R231 Pallor: Secondary | ICD-10-CM

## 2015-11-10 LAB — LIPID PANEL
CHOL/HDL RATIO: 6
CHOLESTEROL: 253 mg/dL — AB (ref 0–200)
HDL: 44.7 mg/dL (ref >39.00)
LDL CALC: 176 mg/dL — AB (ref 0–99)
NONHDL: 208.25
Triglycerides: 160 mg/dL — ABNORMAL HIGH (ref 0.0–149.0)
VLDL: 32 mg/dL (ref 0.0–40.0)

## 2015-11-10 LAB — HEMOGLOBIN A1C: HEMOGLOBIN A1C: 9.9 % — AB (ref 4.6–6.5)

## 2015-11-10 LAB — TSH: TSH: 4.83 u[IU]/mL — ABNORMAL HIGH (ref 0.35–4.50)

## 2015-11-10 MED ORDER — OXYCODONE-ACETAMINOPHEN 5-325 MG PO TABS: 1.0000 | ORAL_TABLET | Freq: Four times a day (QID) | ORAL | 0 refills | Status: DC | PRN

## 2015-11-10 MED ORDER — INSULIN DETEMIR 100 UNIT/ML FLEXPEN: 34.0000 [IU] | PEN_INJECTOR | Freq: Every day | SUBCUTANEOUS | 3 refills | Status: DC

## 2015-11-10 NOTE — Progress Notes (Signed)
OFFICE VISIT  11/10/2015   CC:  Chief Complaint  Patient presents with  . Follow-up    HPI:    Patient is a 60 y.o. Caucasian female who presents for 6 week f/u left sided rib fractures, DM 2 with poor control. Taking 2 aleve bid and taking percocet prn.  The pain is continuing to gradually lessen.  Her percocet bottle has several pills still in it from when I rx'd it 6 wks ago, #60.  It has been nearly 3 months now since her rib fractures.  She has chronic pain in neck that was treated with chronic narcotics long term until she failed a UDS at her pain mgmt MD's office.  Her pain meds were abruptly stopped (months ago).  I feel like she is going to be a person that lingers with this rib pain for quite a while and we have an agreement to gradually ween down her percocet.  DM 2: 200s.  Using 9-11 units at mealtimes.  She increased her levemir to 36 from 62.   No hypoglycemia.  Her eating is fairly erratic, good sometimes and sometimes very poor.  ROS: chronic fatigue.  Past Medical History:  Diagnosis Date  . Abnormal mammogram 12/2014   F/u diagnostic mammo and u/s NORMAL--resume annual screening mammography  . Bipolar disorder ([place])    questionable  . Chronic fatigue   . Chronic nausea 2012/13   Normal upper GI  12/2011 (mild GERD noted +small hiatal hernia)+  . Chronic neck pain    cervical spondylosis on 2013 MRI  . Chronic pain syndrome    Dr. Francesco Runner at [facility] was her pain mgmt MD and he discharged her from [facility] pain mgmt center b/c she failed a UDS (+marijuana) 04/27/15  . Chronic renal insufficiency, stage II (mild) 02/2015   GFR 70s  . Colon cancer screening 03/06/2011   iFob 11/2010 through her insurer was negative. iFOB 01/2015 NEG.  . Diabetes mellitus    urine protein-Cr ratio nl 09/2010, D.R. Screen neg 10/2009  . Fracture of rib of left side 08/14/15   5th and 6th  . Hepatic steatosis 2005   ultrasound  . Insomnia   . Iron deficiency anemia 09/2009   malabsorbtion  s/p bariatric surgery  . Nephrolithiasis 11/2012  . Neurosis, anxiety, panic type   . Obesity    s/p bariatric surgery  . Osteopenia 12/2010   Hip T score -2.0.  Spine T score -0.5 (plan to repeat in 2 yrs)  . Other B-complex deficiencies   . Restless legs syndrome   . Tobacco dependence   . Vitamin D deficiency 09/2009    Past Surgical History:  Procedure Laterality Date  . ABDOMINAL HYSTERECTOMY     fibroids, ovaries remain  . APPENDECTOMY    . BLADDER SURGERY     bladder tack  . CERVICAL SPINE SURGERY  09/2009  . CHOLECYSTECTOMY  04/2010   w/lysis of adhesions (open procedure)--path showed chronic cholecystitis and cholesterol polyp.  . CYSTOSCOPY/RETROGRADE/URETEROSCOPY/STONE EXTRACTION WITH BASKET  11/2012   Left UVJ stone  . CYSTOSCOPY/RETROGRADE/URETEROSCOPY/STONE EXTRACTION WITH BASKET Left 11/21/2012   Procedure: CYSTOSCOPY/RETROGRADE/URETEROSCOPY/STONE EXTRACTION WITH BASKET insertion double j stent;  Surgeon: Ailene Rud, MD;  Location: [facility];  Service: Urology;  Laterality: Left;  . EPIDURAL BLOCK INJECTION  03/2011   Cervical  . HERNIA REPAIR  2005   mesenteric hernia repair, with lysis of adhesions (presented with SBO)  . ROUX-EN-Y GASTRIC BYPASS    . TONSILLECTOMY  Outpatient Medications Prior to Visit  Medication Sig Dispense Refill  . albuterol (VENTOLIN HFA) 108 (90 BASE) MCG/ACT inhaler Inhale 2 puffs into the lungs every 6 (six) hours as needed for wheezing or shortness of breath. 1 Inhaler 1  . amphetamine-dextroamphetamine (ADDERALL) 10 MG tablet 1 tab po bid 60 tablet 0  . B-D UF III MINI PEN NEEDLES 31G X 5 MM MISC USE AS DIRECTED. 100 each 6  . blood glucose meter kit and supplies Use up to four times daily as directed. 1 each 0  . calcium elemental as carbonate (TUMS ULTRA 1000) 400 MG tablet Chew 1,000 mg by mouth daily.      . Cholecalciferol 2000 UNITS CAPS Take 1 capsule (2,000 Units total) by mouth daily. 30 each 11  . docusate  sodium (COLACE) 100 MG capsule Take 1 capsule (100 mg total) by mouth every 12 (twelve) hours. 60 capsule 0  . DULoxetine (CYMBALTA) 30 MG capsule Take 1 capsule (30 mg total) by mouth 2 (two) times daily. 60 capsule 6  . gabapentin (NEURONTIN) 300 MG capsule TAKE 4 CAPSULES BY MOUTH AT BEDTIME. 360 capsule 3  . Insulin Pen Needle 31G X 6 MM MISC Use as directed 100 each 6  . lamoTRIgine (LAMICTAL) 100 MG tablet TAKE 1 TABLET BY MOUTH TWICE DAILY. 60 tablet 4  . LORazepam (ATIVAN) 2 MG tablet TAKE (1) TABLET BY MOUTH EVERY EIGHT HOURS AS NEEDED. 90 tablet 5  . mirtazapine (REMERON) 30 MG tablet TAKE (1) TABLET BY MOUTH AT BEDTIME. 30 tablet 6  . NOVOLOG FLEXPEN 100 UNIT/ML FlexPen SS 15 mL 6  . tiZANidine (ZANAFLEX) 4 MG tablet Take 1 tablet by mouth 2 (two) times daily.     . Insulin Detemir (LEVEMIR FLEXTOUCH) 100 UNIT/ML Pen Inject 34 Units into the skin at bedtime. 15 mL 3  . oxyCODONE-acetaminophen (PERCOCET/ROXICET) 5-325 MG tablet Take 1-2 tablets by mouth every 6 (six) hours as needed. 60 tablet 0   No facility-administered medications prior to visit.     Allergies  Allergen Reactions  . Aspirin Other (See Comments)    Gastric Bypass  . Codeine     REACTION: itching  . Latex     blisters  . Penicillins     REACTION: took large quantities as a child and was told to never take again    ROS As per HPI  PE: Blood pressure 129/84, pulse (!) 108, temperature 97.9 F (36.6 C), temperature source Oral, resp. rate 18, weight 175 lb 12.8 oz (79.7 kg), SpO2 97 %. Gen: Alert, well appearing.  Patient is oriented to person, place, time, and situation. AFFECT: pleasant, lucid thought and speech. No further exam today.  LABS:  Lab Results  Component Value Date   TSH 1.65 07/13/2014   Lab Results  Component Value Date   WBC 14.8 (H) 08/14/2015   HGB 15.3 (H) 08/14/2015   HCT 45.0 08/14/2015   MCV 95.3 08/14/2015   PLT 269 08/14/2015   Lab Results  Component Value Date    CREATININE 1.00 08/14/2015   BUN 18 08/14/2015   NA 137 08/14/2015   K 4.1 08/14/2015   CL 100 (L) 08/14/2015   CO2 25 08/14/2015   Lab Results  Component Value Date   ALT 21 08/14/2015   AST 33 08/14/2015   ALKPHOS 91 08/14/2015   BILITOT 0.5 08/14/2015   Lab Results  Component Value Date   CHOL 246 (H) 08/08/2015   Lab Results  Component Value  Date   HDL 50.50 08/08/2015   Lab Results  Component Value Date   LDLCALC 27 11/13/2011   Lab Results  Component Value Date   TRIG 202.0 (H) 08/08/2015   Lab Results  Component Value Date   CHOLHDL 5 08/08/2015   Lab Results  Component Value Date   HGBA1C 9.5 (H) 08/08/2015    IMPRESSION AND PLAN:  1) Left sided broken ribs: pain gradually improving.  It will take a while for her. I gave another rx for #50 Percocet 5/325, 1-2 q6h prn.  This should certainly last her until I see her again in 6 wks.  2) DM 2: hx of poor control of late. Barely titrating basal insulin. I recommended she increase her levemir to 40 U qhs and she'll continue her SS mealtime insulin. Check HbA1c today.  3) Hyperlipidemia: last panel showed trigs up as well as direct LDL.  These numbers likely were affected by her chronic hyperglycemia.  Hopefully better now. Recheck lipid panel today.  4) Chronic fatigue: check TSH today.  Hb was good 08/14/15.  5) Preventative health care: pneumovax 23 and flu vaccines given today.  An After Visit Summary was printed and given to the patient.  FOLLOW UP: Return in about 6 weeks (around 12/22/2015) for f/u rib fx's + DM 2.  Signed:  Crissie Sickles, MD           11/10/2015

## 2015-11-10 NOTE — Progress Notes (Signed)
Pre visit review using our clinic review tool, if applicable. No additional management support is needed unless otherwise documented below in the visit note. 

## 2015-11-14 ENCOUNTER — Other Ambulatory Visit (INDEPENDENT_AMBULATORY_CARE_PROVIDER_SITE_OTHER): Payer: Commercial Managed Care - HMO

## 2015-11-14 DIAGNOSIS — R946 Abnormal results of thyroid function studies: Secondary | ICD-10-CM | POA: Diagnosis not present

## 2015-11-14 LAB — T4, FREE: Free T4: 0.89 ng/dL (ref 0.60–1.60)

## 2015-11-15 LAB — T3: T3, Total: 142 ng/dL (ref 76–181)

## 2015-11-16 ENCOUNTER — Encounter: Payer: Self-pay | Admitting: Family Medicine

## 2015-12-22 ENCOUNTER — Encounter: Payer: Self-pay | Admitting: Family Medicine

## 2015-12-22 ENCOUNTER — Ambulatory Visit (INDEPENDENT_AMBULATORY_CARE_PROVIDER_SITE_OTHER): Payer: Commercial Managed Care - HMO | Admitting: Family Medicine

## 2015-12-22 VITALS — BP 120/77 | HR 106 | Temp 96.4°F | Resp 16 | Wt 180.8 lb

## 2015-12-22 DIAGNOSIS — E118 Type 2 diabetes mellitus with unspecified complications: Secondary | ICD-10-CM | POA: Diagnosis not present

## 2015-12-22 DIAGNOSIS — IMO0002 Reserved for concepts with insufficient information to code with codable children: Secondary | ICD-10-CM

## 2015-12-22 DIAGNOSIS — S2242XD Multiple fractures of ribs, left side, subsequent encounter for fracture with routine healing: Secondary | ICD-10-CM | POA: Diagnosis not present

## 2015-12-22 DIAGNOSIS — E1165 Type 2 diabetes mellitus with hyperglycemia: Secondary | ICD-10-CM | POA: Diagnosis not present

## 2015-12-22 DIAGNOSIS — E782 Mixed hyperlipidemia: Secondary | ICD-10-CM

## 2015-12-22 DIAGNOSIS — Z794 Long term (current) use of insulin: Secondary | ICD-10-CM

## 2015-12-22 MED ORDER — ATORVASTATIN CALCIUM 40 MG PO TABS: 40.0000 mg | ORAL_TABLET | Freq: Every day | ORAL | 6 refills | Status: DC

## 2015-12-22 MED ORDER — GLUCOSE BLOOD VI STRP: ORAL_STRIP | 12 refills | Status: DC

## 2015-12-22 MED ORDER — HYDROCODONE-ACETAMINOPHEN 5-325 MG PO TABS: 1.0000 | ORAL_TABLET | Freq: Four times a day (QID) | ORAL | 0 refills | Status: DC | PRN

## 2015-12-22 MED ORDER — INSULIN PEN NEEDLE 31G X 5 MM MISC: 0 refills | Status: DC

## 2015-12-22 NOTE — Progress Notes (Signed)
Pre visit review using our clinic review tool, if applicable. No additional management support is needed unless otherwise documented below in the visit note. 

## 2015-12-22 NOTE — Progress Notes (Signed)
OFFICE VISIT  12/22/2015   CC:  Chief Complaint  Patient presents with  . Follow-up    rib fracture     HPI:    Patient is a 60 y.o. Caucasian female who presents for 6 week f/u left sided rib fractures, DM 2 with poor control. Last visit I rx'd 50 percocet tabs, instructions 1-2 q6h prn--to last at least until today.  The pain continues to gradually diminish and she brings up the idea of changing to milder pain med like vicodin today.  She says she has about 8 percocet left.   DM:  30 levemir, getting under 200 consistently now but still slowly titrating up to get fasting to goal  100-110 range. Levemir Novolog per sliding scale. No hypoglycemia since I saw her last.  Past Medical History:  Diagnosis Date  . Abnormal mammogram 12/2014   F/u diagnostic mammo and u/s NORMAL--resume annual screening mammography  . Bipolar disorder ([place])    questionable  . Chronic fatigue   . Chronic nausea 2012/13   Normal upper GI  12/2011 (mild GERD noted +small hiatal hernia)+  . Chronic neck pain    cervical spondylosis on 2013 MRI  . Chronic pain syndrome    Dr. Francesco Runner at [facility] was her pain mgmt MD and he discharged her from [facility] pain mgmt center b/c she failed a UDS (+marijuana) 04/27/15  . Chronic renal insufficiency, stage II (mild) 02/2015   GFR 70s  . Colon cancer screening 03/06/2011   iFob 11/2010 through her insurer was negative. iFOB 01/2015 NEG.  . Diabetes mellitus    urine protein-Cr ratio nl 09/2010, D.R. Screen neg 10/2009  . Fracture of rib of left side 08/14/15   5th and 6th  . Hepatic steatosis 2005   ultrasound  . Hyperlipidemia, mixed 2017   Waiting to start statin (trying to take one thing at a time with her, working on glucose control first)  . Insomnia   . Iron deficiency anemia 09/2009   malabsorbtion s/p bariatric surgery  . Nephrolithiasis 11/2012  . Neurosis, anxiety, panic type   . Obesity    s/p bariatric surgery  . Osteopenia 12/2010   Hip T score  -2.0.  Spine T score -0.5 (plan to repeat in 2 yrs)  . Other B-complex deficiencies   . Restless legs syndrome   . Tobacco dependence   . Vitamin D deficiency 09/2009    Past Surgical History:  Procedure Laterality Date  . ABDOMINAL HYSTERECTOMY     fibroids, ovaries remain  . APPENDECTOMY    . BLADDER SURGERY     bladder tack  . CERVICAL SPINE SURGERY  09/2009  . CHOLECYSTECTOMY  04/2010   w/lysis of adhesions (open procedure)--path showed chronic cholecystitis and cholesterol polyp.  . CYSTOSCOPY/RETROGRADE/URETEROSCOPY/STONE EXTRACTION WITH BASKET  11/2012   Left UVJ stone  . CYSTOSCOPY/RETROGRADE/URETEROSCOPY/STONE EXTRACTION WITH BASKET Left 11/21/2012   Procedure: CYSTOSCOPY/RETROGRADE/URETEROSCOPY/STONE EXTRACTION WITH BASKET insertion double j stent;  Surgeon: Ailene Rud, MD;  Location: [facility];  Service: Urology;  Laterality: Left;  . EPIDURAL BLOCK INJECTION  03/2011   Cervical  . HERNIA REPAIR  2005   mesenteric hernia repair, with lysis of adhesions (presented with SBO)  . ROUX-EN-Y GASTRIC BYPASS    . TONSILLECTOMY      Outpatient Medications Prior to Visit  Medication Sig Dispense Refill  . albuterol (VENTOLIN HFA) 108 (90 BASE) MCG/ACT inhaler Inhale 2 puffs into the lungs every 6 (six) hours as needed for wheezing or shortness  of breath. 1 Inhaler 1  . amphetamine-dextroamphetamine (ADDERALL) 10 MG tablet 1 tab po bid 60 tablet 0  . blood glucose meter kit and supplies Use up to four times daily as directed. 1 each 0  . calcium elemental as carbonate (TUMS ULTRA 1000) 400 MG tablet Chew 1,000 mg by mouth daily.      . Cholecalciferol 2000 UNITS CAPS Take 1 capsule (2,000 Units total) by mouth daily. 30 each 11  . docusate sodium (COLACE) 100 MG capsule Take 1 capsule (100 mg total) by mouth every 12 (twelve) hours. 60 capsule 0  . DULoxetine (CYMBALTA) 30 MG capsule Take 1 capsule (30 mg total) by mouth 2 (two) times daily. 60 capsule 6  . gabapentin  (NEURONTIN) 300 MG capsule TAKE 4 CAPSULES BY MOUTH AT BEDTIME. 360 capsule 3  . Insulin Detemir (LEVEMIR FLEXTOUCH) 100 UNIT/ML Pen Inject 34 Units into the skin at bedtime. 40 U SQ qhs 15 mL 3  . Insulin Pen Needle 31G X 6 MM MISC Use as directed 100 each 6  . lamoTRIgine (LAMICTAL) 100 MG tablet TAKE 1 TABLET BY MOUTH TWICE DAILY. 60 tablet 4  . LORazepam (ATIVAN) 2 MG tablet TAKE (1) TABLET BY MOUTH EVERY EIGHT HOURS AS NEEDED. 90 tablet 5  . mirtazapine (REMERON) 30 MG tablet TAKE (1) TABLET BY MOUTH AT BEDTIME. 30 tablet 6  . NOVOLOG FLEXPEN 100 UNIT/ML FlexPen SS 15 mL 6  . tiZANidine (ZANAFLEX) 4 MG tablet Take 1 tablet by mouth 2 (two) times daily.     . B-D UF III MINI PEN NEEDLES 31G X 5 MM MISC USE AS DIRECTED. 100 each 6  . oxyCODONE-acetaminophen (PERCOCET/ROXICET) 5-325 MG tablet Take 1-2 tablets by mouth every 6 (six) hours as needed. 50 tablet 0   No facility-administered medications prior to visit.     Allergies  Allergen Reactions  . Aspirin Other (See Comments)    Gastric Bypass  . Codeine     REACTION: itching  . Latex     blisters  . Penicillins     REACTION: took large quantities as a child and was told to never take again    ROS As per HPI  PE: Blood pressure 120/77, pulse (!) 106, temperature (!) 96.4 F (35.8 C), temperature source Temporal, resp. rate 16, weight 180 lb 12.8 oz (82 kg), SpO2 96 %. Gen: Alert, well appearing.  Patient is oriented to person, place, time, and situation. AFFECT: pleasant, lucid thought and speech. No further exam today.  LABS:  Lab Results  Component Value Date   TSH 4.83 (H) 11/10/2015   Lab Results  Component Value Date   WBC 14.8 (H) 08/14/2015   HGB 15.3 (H) 08/14/2015   HCT 45.0 08/14/2015   MCV 95.3 08/14/2015   PLT 269 08/14/2015   Lab Results  Component Value Date   CREATININE 1.00 08/14/2015   BUN 18 08/14/2015   NA 137 08/14/2015   K 4.1 08/14/2015   CL 100 (L) 08/14/2015   CO2 25 08/14/2015    Lab Results  Component Value Date   ALT 21 08/14/2015   AST 33 08/14/2015   ALKPHOS 91 08/14/2015   BILITOT 0.5 08/14/2015   Lab Results  Component Value Date   CHOL 253 (H) 11/10/2015   Lab Results  Component Value Date   HDL 44.70 11/10/2015   Lab Results  Component Value Date   LDLCALC 176 (H) 11/10/2015   Lab Results  Component Value Date  TRIG 160.0 (H) 11/10/2015   Lab Results  Component Value Date   CHOLHDL 6 11/10/2015   Lab Results  Component Value Date   HGBA1C 9.9 (H) 11/10/2015   IMPRESSION AND PLAN:  1) Left sided rib fracture pain: gradually subsiding.  Pt with poor pain tolerance, hx of requiring high dose narcotics for chronic neck pain w/radiculopathy in the past but was abruptly d/c'd from these meds when she had marijuana on her pain mgmt MDs UDS.  We are slowly weening her pain med.  Change to vicodin today, 5/325, 1-2 q6h prn, #40---to last at least until next f/u visit here in 6 wks.  2) DM 2, poor control. Improving slowly. She struggles with diet, is slow to titrate insulin due to hx of hypoglycemic episodes. She will continue to try to slowly titrate levemir to get her fasting glucose to 100-110 range. Continue novolog SSI at meals:  If pre-meal glucose is 100-150, take 7 units of mealtime insulin. If premeal glucose 151-200, take 9 units of mealtime insulin. If premeal glucose is 201-250, take 11 units. If premeal glucose is 251-300, take 13 units. If premeal glucose is <100, don't give insulin.  3) Hyperlipidemia: start atorvastatin today, 58m.  An After Visit Summary was printed and given to the patient.  FOLLOW UP: Return in about 6 weeks (around 02/02/2016) for f/u rib fx, DM, HLD.  Signed:  PCrissie Sickles MD           12/22/2015

## 2015-12-25 ENCOUNTER — Other Ambulatory Visit: Payer: Self-pay | Admitting: Family Medicine

## 2016-01-24 ENCOUNTER — Other Ambulatory Visit: Payer: Self-pay | Admitting: Family Medicine

## 2016-01-24 NOTE — Telephone Encounter (Signed)
RF request for lamotrigine LOV: 12/22/15 Next ov: 02/02/16 Last written: 02/17/15 #60 w/ 4RF  Please advise. Thanks.   _______________________  RF request for atorvastatin requesting 90 day supply Last written: 12/22/15 #30 w/ 6RF  Rx sent.

## 2016-02-02 ENCOUNTER — Ambulatory Visit (INDEPENDENT_AMBULATORY_CARE_PROVIDER_SITE_OTHER): Payer: Commercial Managed Care - HMO | Admitting: Family Medicine

## 2016-02-02 ENCOUNTER — Encounter: Payer: Self-pay | Admitting: Family Medicine

## 2016-02-02 VITALS — BP 121/80 | HR 102 | Temp 98.5°F | Resp 16 | Ht 62.0 in | Wt 181.8 lb

## 2016-02-02 DIAGNOSIS — Z79899 Other long term (current) drug therapy: Secondary | ICD-10-CM

## 2016-02-02 DIAGNOSIS — IMO0001 Reserved for inherently not codable concepts without codable children: Secondary | ICD-10-CM

## 2016-02-02 DIAGNOSIS — F909 Attention-deficit hyperactivity disorder, unspecified type: Secondary | ICD-10-CM | POA: Diagnosis not present

## 2016-02-02 DIAGNOSIS — E7800 Pure hypercholesterolemia, unspecified: Secondary | ICD-10-CM | POA: Diagnosis not present

## 2016-02-02 DIAGNOSIS — E1165 Type 2 diabetes mellitus with hyperglycemia: Secondary | ICD-10-CM | POA: Diagnosis not present

## 2016-02-02 DIAGNOSIS — S2242XD Multiple fractures of ribs, left side, subsequent encounter for fracture with routine healing: Secondary | ICD-10-CM

## 2016-02-02 LAB — POCT GLYCOSYLATED HEMOGLOBIN (HGB A1C): Hemoglobin A1C: 9.4

## 2016-02-02 MED ORDER — AMPHETAMINE-DEXTROAMPHETAMINE 10 MG PO TABS: ORAL_TABLET | ORAL | 0 refills | Status: DC

## 2016-02-02 MED ORDER — LORAZEPAM 2 MG PO TABS: ORAL_TABLET | ORAL | 5 refills | Status: DC

## 2016-02-02 MED ORDER — HYDROCODONE-ACETAMINOPHEN 5-325 MG PO TABS: 1.0000 | ORAL_TABLET | Freq: Four times a day (QID) | ORAL | 0 refills | Status: DC | PRN

## 2016-02-02 NOTE — Addendum Note (Signed)
Addended by: Gordy Councilman on: 02/02/2016 04:17 PM   Modules accepted: Orders

## 2016-02-02 NOTE — Progress Notes (Signed)
Pre visit review using our clinic review tool, if applicable. No additional management support is needed unless otherwise documented below in the visit note. 

## 2016-02-02 NOTE — Progress Notes (Signed)
OFFICE VISIT  02/02/2016   CC:  Chief Complaint  Patient presents with  . Follow-up    Rib Fx, DM and HLD   HPI:    Patient is a 60 y.o. Caucasian female who presents for 6 week f/u DM and HLD. Last visit we discussed up-titration of her insulins and we started atorvastatin for HLD.  She is also here for f/u of her left sided rib fractures.  We have slowly been weening down her pain med for this.  She has a tendency to magnify pain and has low pain tolerance.    DM: says glucoses have been gradually improving.  In mid 100s for the most part for the last couple weeks. Fasting around 100.  Taking 46 U levemir.  She has done some dietary adjustments. Avg mealtime insulin dose is 5-7 units. Exercise: none, but she is getting up out of the chair and moving now. It is very difficult to get helpful info from patient about her glucoses.  She is taking atorvastatin, thinks it may be causing nausea.    Adult ADHD: pt request adderall RF today.  She states her focus/attention/motivation are all adequately helped by this med.  No adverse side effects.  For her rib pain, she did make the vicodin last since last visit--she went on to ask about hip pain and low back pain today and I told her that these issues had to be taken up/managed by her specialist, b/c we did not have the time to cover them, esp with trying to get her diabetes under better control.  She said Dr. Carloyn Manner is who did her neck surgery, so she'll call his office for appt.    Past Medical History:  Diagnosis Date  . Abnormal mammogram 12/2014   F/u diagnostic mammo and u/s NORMAL--resume annual screening mammography  . Bipolar disorder ([place])    questionable  . Chronic fatigue   . Chronic nausea 2012/13   Normal upper GI  12/2011 (mild GERD noted +small hiatal hernia)+  . Chronic neck pain    cervical spondylosis on 2013 MRI  . Chronic pain syndrome    Dr. Francesco Runner at [facility] was her pain mgmt MD and he discharged her from [facility]  pain mgmt center b/c she failed a UDS (+marijuana) 04/27/15  . Chronic renal insufficiency, stage II (mild) 02/2015   GFR 70s  . Colon cancer screening 03/06/2011   iFob 11/2010 through her insurer was negative. iFOB 01/2015 NEG.  . Diabetes mellitus    urine protein-Cr ratio nl 09/2010, D.R. Screen neg 10/2009  . Fracture of rib of left side 08/14/15   5th and 6th  . Hepatic steatosis 2005   ultrasound  . Hyperlipidemia, mixed 2017   Waiting to start statin (trying to take one thing at a time with her, working on glucose control first)  . Insomnia   . Iron deficiency anemia 09/2009   malabsorbtion s/p bariatric surgery  . Nephrolithiasis 11/2012  . Neurosis, anxiety, panic type   . Obesity    s/p bariatric surgery  . Osteopenia 12/2010   Hip T score -2.0.  Spine T score -0.5 (plan to repeat in 2 yrs)  . Other B-complex deficiencies   . Restless legs syndrome   . Tobacco dependence   . Vitamin D deficiency 09/2009    Past Surgical History:  Procedure Laterality Date  . ABDOMINAL HYSTERECTOMY     fibroids, ovaries remain  . APPENDECTOMY    . BLADDER SURGERY  bladder tack  . CERVICAL SPINE SURGERY  09/2009  . CHOLECYSTECTOMY  04/2010   w/lysis of adhesions (open procedure)--path showed chronic cholecystitis and cholesterol polyp.  . CYSTOSCOPY/RETROGRADE/URETEROSCOPY/STONE EXTRACTION WITH BASKET  11/2012   Left UVJ stone  . CYSTOSCOPY/RETROGRADE/URETEROSCOPY/STONE EXTRACTION WITH BASKET Left 11/21/2012   Procedure: CYSTOSCOPY/RETROGRADE/URETEROSCOPY/STONE EXTRACTION WITH BASKET insertion double j stent;  Surgeon: Ailene Rud, MD;  Location: [facility];  Service: Urology;  Laterality: Left;  . EPIDURAL BLOCK INJECTION  03/2011   Cervical  . HERNIA REPAIR  2005   mesenteric hernia repair, with lysis of adhesions (presented with SBO)  . ROUX-EN-Y GASTRIC BYPASS    . TONSILLECTOMY      Outpatient Medications Prior to Visit  Medication Sig Dispense Refill  . albuterol  (VENTOLIN HFA) 108 (90 BASE) MCG/ACT inhaler Inhale 2 puffs into the lungs every 6 (six) hours as needed for wheezing or shortness of breath. 1 Inhaler 1  . atorvastatin (LIPITOR) 40 MG tablet TAKE (1) TABLET BY MOUTH AT BEDTIME. 90 tablet 3  . blood glucose meter kit and supplies Use up to four times daily as directed. 1 each 0  . calcium elemental as carbonate (TUMS ULTRA 1000) 400 MG tablet Chew 1,000 mg by mouth daily.      . Cholecalciferol 2000 UNITS CAPS Take 1 capsule (2,000 Units total) by mouth daily. 30 each 11  . docusate sodium (COLACE) 100 MG capsule Take 1 capsule (100 mg total) by mouth every 12 (twelve) hours. 60 capsule 0  . DULoxetine (CYMBALTA) 30 MG capsule Take 1 capsule (30 mg total) by mouth 2 (two) times daily. (Patient taking differently: Take 30 mg by mouth daily. ) 60 capsule 6  . gabapentin (NEURONTIN) 300 MG capsule TAKE 4 CAPSULES BY MOUTH AT BEDTIME. 360 capsule 1  . glucose blood (ACCU-CHEK AVIVA PLUS) test strip Use to check glucose 4 times per day 100 each 12  . Insulin Detemir (LEVEMIR FLEXTOUCH) 100 UNIT/ML Pen Inject 34 Units into the skin at bedtime. 40 U SQ qhs (Patient taking differently: Inject 49 Units into the skin at bedtime. ) 15 mL 3  . Insulin Pen Needle (B-D UF III MINI PEN NEEDLES) 31G X 5 MM MISC Use to inject insulin 4 times per day 100 each 0  . Insulin Pen Needle 31G X 6 MM MISC Use as directed 100 each 6  . lamoTRIgine (LAMICTAL) 100 MG tablet TAKE 1 TABLET BY MOUTH TWICE DAILY. (Patient taking differently: taking 1 tablet daily) 60 tablet 6  . mirtazapine (REMERON) 30 MG tablet TAKE (1) TABLET BY MOUTH AT BEDTIME. 90 tablet 3  . NOVOLOG FLEXPEN 100 UNIT/ML FlexPen SS 15 mL 6  . tiZANidine (ZANAFLEX) 4 MG tablet Take 1 tablet by mouth 2 (two) times daily.     Marland Kitchen amphetamine-dextroamphetamine (ADDERALL) 10 MG tablet 1 tab po bid 60 tablet 0  . HYDROcodone-acetaminophen (NORCO/VICODIN) 5-325 MG tablet Take 1-2 tablets by mouth every 6 (six) hours  as needed for moderate pain. 40 tablet 0  . LORazepam (ATIVAN) 2 MG tablet TAKE (1) TABLET BY MOUTH EVERY EIGHT HOURS AS NEEDED. 90 tablet 5   No facility-administered medications prior to visit.     Allergies  Allergen Reactions  . Aspirin Other (See Comments)    Gastric Bypass  . Codeine     REACTION: itching  . Latex     blisters  . Penicillins     REACTION: took large quantities as a child and was told to never  take again    ROS As per HPI  PE: Blood pressure 121/80, pulse (!) 102, temperature 98.5 F (36.9 C), temperature source Oral, resp. rate 16, height _0  (1.575 m), weight 181 lb 12 oz (82.4 kg), SpO2 95 %. Gen: Alert, well appearing.  Patient is oriented to person, place, time, and situation. CV: RRR, no m/r/g.   LUNGS: CTA bilat, nonlabored resps, good aeration in all lung fields. EXT: no clubbing, cyanosis, or edema.    LABS:  Lab Results  Component Value Date   TSH 4.83 (H) 11/10/2015   Lab Results  Component Value Date   WBC 14.8 (H) 08/14/2015   HGB 15.3 (H) 08/14/2015   HCT 45.0 08/14/2015   MCV 95.3 08/14/2015   PLT 269 08/14/2015   Lab Results  Component Value Date   CREATININE 1.00 08/14/2015   BUN 18 08/14/2015   NA 137 08/14/2015   K 4.1 08/14/2015   CL 100 (L) 08/14/2015   CO2 25 08/14/2015   Lab Results  Component Value Date   ALT 21 08/14/2015   AST 33 08/14/2015   ALKPHOS 91 08/14/2015   BILITOT 0.5 08/14/2015   Lab Results  Component Value Date   CHOL 253 (H) 11/10/2015   Lab Results  Component Value Date   HDL 44.70 11/10/2015   Lab Results  Component Value Date   LDLCALC 176 (H) 11/10/2015   Lab Results  Component Value Date   TRIG 160.0 (H) 11/10/2015   Lab Results  Component Value Date   CHOLHDL 6 11/10/2015   Lab Results  Component Value Date   HGBA1C 9.4 02/02/2016   POCT HbA1c today: 9.4 %  IMPRESSION AND PLAN:  1) DM 2, poor control. Sounds like maybe her control has been much better the last  couple of weeks. I told her to focus on titrating her insulins to get into the fasting and postprandial ranges that we have discussed multiple times in the past.  2) Hyperlipidemia: unclear whether pt tolerating statin or not (? Nausea). Plan: stop atorva x 2 weeks and if nausea goes away then restart atorva and if nausea returns then we'll have to replace atorva with different statin.  3) Adult ADHD: The current medical regimen is effective;  continue present plan and medications. I printed rx's for adderall IR 44m, 1 bid, #60 today for the next 3 months.  Appropriate fill on/after date was noted on each rx.  4) Chronic pain: neck, hips, back.  I reiterated that I would not manage her chronic pain. She was fired [facility] for failing a UDS. She is to call Dr. RCarloyn Mannerso she can try to address potential further w/u for her pain in [place].  An After Visit Summary was printed and given to the patient.  FOLLOW UP: Return in about 6 weeks (around 03/15/2016) for f/u rib pain/DM 2/HLD.  Signed:  PCrissie Sickles MD           02/02/2016

## 2016-02-14 ENCOUNTER — Other Ambulatory Visit: Payer: Self-pay | Admitting: *Deleted

## 2016-02-14 DIAGNOSIS — Z23 Encounter for immunization: Secondary | ICD-10-CM

## 2016-02-14 DIAGNOSIS — N183 Chronic kidney disease, stage 3 unspecified: Secondary | ICD-10-CM

## 2016-02-14 DIAGNOSIS — R231 Pallor: Secondary | ICD-10-CM

## 2016-02-14 DIAGNOSIS — Z79899 Other long term (current) drug therapy: Secondary | ICD-10-CM

## 2016-02-14 DIAGNOSIS — R000 Tachycardia, unspecified: Secondary | ICD-10-CM

## 2016-02-14 MED ORDER — INSULIN DETEMIR 100 UNIT/ML FLEXPEN: 49.0000 [IU] | PEN_INJECTOR | Freq: Every day | SUBCUTANEOUS | 3 refills | Status: DC

## 2016-02-14 NOTE — Telephone Encounter (Signed)
RF request for levemir LOV: 02/02/16 Next ov: 04/03/16 Last written: 11/10/15 #56m w/ 3RF

## 2016-03-15 ENCOUNTER — Ambulatory Visit: Payer: Commercial Managed Care - HMO | Admitting: Family Medicine

## 2016-03-22 ENCOUNTER — Ambulatory Visit (INDEPENDENT_AMBULATORY_CARE_PROVIDER_SITE_OTHER): Payer: Medicare HMO | Admitting: Family Medicine

## 2016-03-22 ENCOUNTER — Encounter: Payer: Self-pay | Admitting: Family Medicine

## 2016-03-22 VITALS — BP 105/70 | HR 99 | Temp 98.4°F | Resp 16 | Ht 62.0 in | Wt 178.2 lb

## 2016-03-22 DIAGNOSIS — E7800 Pure hypercholesterolemia, unspecified: Secondary | ICD-10-CM

## 2016-03-22 DIAGNOSIS — R0781 Pleurodynia: Secondary | ICD-10-CM

## 2016-03-22 DIAGNOSIS — F17200 Nicotine dependence, unspecified, uncomplicated: Secondary | ICD-10-CM | POA: Diagnosis not present

## 2016-03-22 DIAGNOSIS — Z8781 Personal history of (healed) traumatic fracture: Secondary | ICD-10-CM

## 2016-03-22 DIAGNOSIS — Z794 Long term (current) use of insulin: Secondary | ICD-10-CM

## 2016-03-22 DIAGNOSIS — E118 Type 2 diabetes mellitus with unspecified complications: Secondary | ICD-10-CM | POA: Diagnosis not present

## 2016-03-22 DIAGNOSIS — N952 Postmenopausal atrophic vaginitis: Secondary | ICD-10-CM

## 2016-03-22 MED ORDER — HYDROCODONE-ACETAMINOPHEN 5-325 MG PO TABS: 1.0000 | ORAL_TABLET | Freq: Four times a day (QID) | ORAL | 0 refills | Status: DC | PRN

## 2016-03-22 MED ORDER — ESTROGENS, CONJUGATED 0.625 MG/GM VA CREA: 1.0000 | TOPICAL_CREAM | Freq: Every day | VAGINAL | 12 refills | Status: DC

## 2016-03-22 MED ORDER — VARENICLINE TARTRATE 0.5 MG X 11 & 1 MG X 42 PO MISC: ORAL | 0 refills | Status: DC

## 2016-03-22 MED ORDER — VARENICLINE TARTRATE 1 MG PO TABS: 1.0000 mg | ORAL_TABLET | Freq: Two times a day (BID) | ORAL | 1 refills | Status: DC

## 2016-03-22 NOTE — Progress Notes (Signed)
Pre visit review using our clinic review tool, if applicable. No additional management support is needed unless otherwise documented below in the visit note. 

## 2016-03-22 NOTE — Progress Notes (Signed)
OFFICE VISIT  03/22/2016   CC:  Chief Complaint  Patient presents with  . Follow-up    Rib pain, DM and HLD   HPI:    Patient is a 61 y.o. Caucasian female who presents for 6 week f/u DM 2, HLD, and left sided rib pain secondary to rib fractures in 2017.  We have slowly been weening down her pain meds. Reports n/v/d illness a few weeks ago or so, lasted 5d.  DM: 51 U Levemir qhs. Glucoses: consistently over 200 the last 2 weeks---she thinks it may be due to the levemir she most recently got was warm when delivered.  Novolog dosing by SS, 11-13 units. No hypoglycemia episodes.   She has started working on slow up-titration of her levemir.  HLD: tolerating atorvastatin fine now--no difference in her recurrent nausea with this med.  Left sided rib pain gradually improving: says she tries to only take 1 pain pill per day for this. Pain worse with twisting and laying on L side.  She currently smokes 1.5 packs of cigs per day and wants to try chantix to quit.  Complains of vaginal dryness and lack of libido, both longstanding problems.  Past Medical History:  Diagnosis Date  . Abnormal mammogram 12/2014   F/u diagnostic mammo and u/s NORMAL--resume annual screening mammography  . Bipolar disorder ([place])    questionable  . Chronic fatigue   . Chronic nausea 2012/13   Normal upper GI  12/2011 (mild GERD noted +small hiatal hernia)+  . Chronic neck pain    cervical spondylosis on 2013 MRI  . Chronic pain syndrome    Dr. Francesco Runner at [facility] was her pain mgmt MD and he discharged her from [facility] pain mgmt center b/c she failed a UDS (+marijuana) 04/27/15  . Chronic renal insufficiency, stage II (mild) 02/2015   GFR 70s  . Colon cancer screening 03/06/2011   iFob 11/2010 through her insurer was negative. iFOB 01/2015 NEG.  . Diabetes mellitus    urine protein-Cr ratio nl 09/2010, D.R. Screen neg 10/2009  . Fracture of rib of left side 08/14/15   5th and 6th  . Hepatic steatosis 2005   ultrasound  . Hyperlipidemia, mixed 2017   Waiting to start statin (trying to take one thing at a time with her, working on glucose control first)  . Insomnia   . Iron deficiency anemia 09/2009   malabsorbtion s/p bariatric surgery  . Nephrolithiasis 11/2012  . Neurosis, anxiety, panic type   . Obesity    s/p bariatric surgery  . Osteopenia 12/2010   Hip T score -2.0.  Spine T score -0.5 (plan to repeat in 2 yrs)  . Other B-complex deficiencies   . Restless legs syndrome   . Tobacco dependence   . Vitamin D deficiency 09/2009    Past Surgical History:  Procedure Laterality Date  . ABDOMINAL HYSTERECTOMY     fibroids, ovaries remain  . APPENDECTOMY    . BLADDER SURGERY     bladder tack  . CERVICAL SPINE SURGERY  09/2009  . CHOLECYSTECTOMY  04/2010   w/lysis of adhesions (open procedure)--path showed chronic cholecystitis and cholesterol polyp.  . CYSTOSCOPY/RETROGRADE/URETEROSCOPY/STONE EXTRACTION WITH BASKET  11/2012   Left UVJ stone  . CYSTOSCOPY/RETROGRADE/URETEROSCOPY/STONE EXTRACTION WITH BASKET Left 11/21/2012   Procedure: CYSTOSCOPY/RETROGRADE/URETEROSCOPY/STONE EXTRACTION WITH BASKET insertion double j stent;  Surgeon: Ailene Rud, MD;  Location: [facility];  Service: Urology;  Laterality: Left;  . EPIDURAL BLOCK INJECTION  03/2011   Cervical  .  HERNIA REPAIR  2005   mesenteric hernia repair, with lysis of adhesions (presented with SBO)  . ROUX-EN-Y GASTRIC BYPASS    . TONSILLECTOMY      Outpatient Medications Prior to Visit  Medication Sig Dispense Refill  . amphetamine-dextroamphetamine (ADDERALL) 10 MG tablet 1 tab po bid 60 tablet 0  . atorvastatin (LIPITOR) 40 MG tablet TAKE (1) TABLET BY MOUTH AT BEDTIME. 90 tablet 3  . blood glucose meter kit and supplies Use up to four times daily as directed. 1 each 0  . calcium elemental as carbonate (TUMS ULTRA 1000) 400 MG tablet Chew 1,000 mg by mouth daily.      . Cholecalciferol 2000 UNITS CAPS Take 1 capsule (2,000  Units total) by mouth daily. 30 each 11  . DULoxetine (CYMBALTA) 30 MG capsule Take 1 capsule (30 mg total) by mouth 2 (two) times daily. 60 capsule 6  . gabapentin (NEURONTIN) 300 MG capsule TAKE 4 CAPSULES BY MOUTH AT BEDTIME. 360 capsule 1  . glucose blood (ACCU-CHEK AVIVA PLUS) test strip Use to check glucose 4 times per day 100 each 12  . Insulin Detemir (LEVEMIR FLEXTOUCH) 100 UNIT/ML Pen Inject 49 Units into the skin at bedtime. (Patient taking differently: Inject 51 Units into the skin at bedtime. ) 15 mL 3  . Insulin Pen Needle (B-D UF III MINI PEN NEEDLES) 31G X 5 MM MISC Use to inject insulin 4 times per day 100 each 0  . Insulin Pen Needle 31G X 6 MM MISC Use as directed 100 each 6  . lamoTRIgine (LAMICTAL) 100 MG tablet TAKE 1 TABLET BY MOUTH TWICE DAILY. (Patient taking differently: taking 1 tablet daily) 60 tablet 6  . LORazepam (ATIVAN) 2 MG tablet TAKE (1) TABLET BY MOUTH EVERY EIGHT HOURS AS NEEDED. 90 tablet 5  . mirtazapine (REMERON) 30 MG tablet TAKE (1) TABLET BY MOUTH AT BEDTIME. 90 tablet 3  . NOVOLOG FLEXPEN 100 UNIT/ML FlexPen SS 15 mL 6  . tiZANidine (ZANAFLEX) 4 MG tablet Take 1 tablet by mouth 2 (two) times daily.     Marland Kitchen HYDROcodone-acetaminophen (NORCO/VICODIN) 5-325 MG tablet Take 1-2 tablets by mouth every 6 (six) hours as needed for moderate pain. 30 tablet 0  . albuterol (VENTOLIN HFA) 108 (90 BASE) MCG/ACT inhaler Inhale 2 puffs into the lungs every 6 (six) hours as needed for wheezing or shortness of breath. (Patient not taking: Reported on 03/22/2016) 1 Inhaler 1  . docusate sodium (COLACE) 100 MG capsule Take 1 capsule (100 mg total) by mouth every 12 (twelve) hours. (Patient not taking: Reported on 03/22/2016) 60 capsule 0   No facility-administered medications prior to visit.     Allergies  Allergen Reactions  . Aspirin Other (See Comments)    Gastric Bypass  . Codeine     REACTION: itching  . Latex     blisters  . Penicillins     REACTION: took large  quantities as a child and was told to never take again    ROS As per HPI  PE: Blood pressure 105/70, pulse 99, temperature 98.4 F (36.9 C), temperature source Oral, resp. rate 16, height _0  (1.575 m), weight 178 lb 4 oz (80.9 kg), SpO2 93 %. Gen: Alert, well appearing.  Patient is oriented to person, place, time, and situation. Left side, posterior aspect of lower 2 ribs are tender to palpation--mild.  LABS:  Lab Results  Component Value Date   TSH 4.83 (H) 11/10/2015   Lab Results  Component  Value Date   WBC 14.8 (H) 08/14/2015   HGB 15.3 (H) 08/14/2015   HCT 45.0 08/14/2015   MCV 95.3 08/14/2015   PLT 269 08/14/2015   Lab Results  Component Value Date   CREATININE 1.00 08/14/2015   BUN 18 08/14/2015   NA 137 08/14/2015   K 4.1 08/14/2015   CL 100 (L) 08/14/2015   CO2 25 08/14/2015   Lab Results  Component Value Date   ALT 21 08/14/2015   AST 33 08/14/2015   ALKPHOS 91 08/14/2015   BILITOT 0.5 08/14/2015   Lab Results  Component Value Date   CHOL 253 (H) 11/10/2015   Lab Results  Component Value Date   HDL 44.70 11/10/2015   Lab Results  Component Value Date   LDLCALC 176 (H) 11/10/2015   Lab Results  Component Value Date   TRIG 160.0 (H) 11/10/2015   Lab Results  Component Value Date   CHOLHDL 6 11/10/2015   Lab Results  Component Value Date   HGBA1C 9.4 02/02/2016   IMPRESSION AND PLAN:  No problem-specific Assessment & Plan notes found for this encounter. 1) DM 2: control poor still.  Pt hesitant to titrate insulins.  Encouraged her again to titrate to our previously discussed glucose goals (fasting and 2H PP).  We'll recheck HbA1c at next f/u in 6 weeks. She was reminded today that she is overdue for her diab retinopathy screening.  2) HLD: tolerating statin. Recheck FLP next f/u 6 wks.  3) Left sided rib pain s/p rib fractures in 2017.  Will continue to ween down pain meds: vicodin 5/325, 1-2 q6h prn, #20.  4) tobacco dependence:  pt wants to try chantix so I rx'd this today.  Therapeutic expectations and side effect profile of medication discussed today.  Patient's questions answered.  5) Vaginal atrophy/dryness: rx'd estrogen cream for pt today.  An After Visit Summary was printed and given to the patient.  FOLLOW UP: Return in about 6 weeks (around 05/03/2016) for routine chronic illness f/u (fasting).  Signed:  Crissie Sickles, MD           03/22/2016

## 2016-04-16 ENCOUNTER — Other Ambulatory Visit: Payer: Self-pay | Admitting: Family Medicine

## 2016-05-02 ENCOUNTER — Telehealth: Payer: Self-pay

## 2016-05-02 NOTE — Telephone Encounter (Signed)
LM requesting call back regarding AWV. Requesting patient to have AWV with [facility] on 05/07/16 @ 0900 after appointment with PCP.

## 2016-05-07 ENCOUNTER — Ambulatory Visit: Payer: Self-pay | Admitting: Family Medicine

## 2016-05-20 ENCOUNTER — Other Ambulatory Visit: Payer: Self-pay | Admitting: Family Medicine

## 2016-05-20 DIAGNOSIS — Z79899 Other long term (current) drug therapy: Secondary | ICD-10-CM

## 2016-05-20 DIAGNOSIS — R231 Pallor: Secondary | ICD-10-CM

## 2016-05-20 DIAGNOSIS — N183 Chronic kidney disease, stage 3 unspecified: Secondary | ICD-10-CM

## 2016-05-20 DIAGNOSIS — Z23 Encounter for immunization: Secondary | ICD-10-CM

## 2016-05-20 DIAGNOSIS — R000 Tachycardia, unspecified: Secondary | ICD-10-CM

## 2016-06-19 ENCOUNTER — Other Ambulatory Visit: Payer: Self-pay | Admitting: Family Medicine

## 2016-06-19 NOTE — Telephone Encounter (Signed)
[place].  RF request for gabapentin LOV: 03/22/16 Next ov: None Last written: 12/25/15 #360 w/ 1RF  Please advise. Thanks.

## 2016-07-19 ENCOUNTER — Other Ambulatory Visit: Payer: Self-pay | Admitting: Family Medicine

## 2016-07-19 DIAGNOSIS — N183 Chronic kidney disease, stage 3 unspecified: Secondary | ICD-10-CM

## 2016-07-19 DIAGNOSIS — Z23 Encounter for immunization: Secondary | ICD-10-CM

## 2016-07-19 DIAGNOSIS — R231 Pallor: Secondary | ICD-10-CM

## 2016-07-19 DIAGNOSIS — Z79899 Other long term (current) drug therapy: Secondary | ICD-10-CM

## 2016-07-19 DIAGNOSIS — R000 Tachycardia, unspecified: Secondary | ICD-10-CM

## 2016-08-15 ENCOUNTER — Other Ambulatory Visit: Payer: Self-pay | Admitting: Family Medicine

## 2016-08-15 DIAGNOSIS — R000 Tachycardia, unspecified: Secondary | ICD-10-CM

## 2016-08-15 DIAGNOSIS — N183 Chronic kidney disease, stage 3 unspecified: Secondary | ICD-10-CM

## 2016-08-15 DIAGNOSIS — Z79899 Other long term (current) drug therapy: Secondary | ICD-10-CM

## 2016-08-15 DIAGNOSIS — Z23 Encounter for immunization: Secondary | ICD-10-CM

## 2016-08-15 DIAGNOSIS — R231 Pallor: Secondary | ICD-10-CM

## 2016-08-15 NOTE — Telephone Encounter (Signed)
[place].  RF request for Levemir LOV: 03/22/16 Next ov: None Last written: 07/19/16 #12m w/ 0RF  RF request for novolog Last written: 08/24/15 #163mw/ 6RF  RF request for duloxetine Last written: 07/19/16 #60 w/ 0RF  SW pt and advised her that she is over due for f/u RCI and will need to schedule f/u for more refills. Will send in 2 months supply to get pt by til she can come in in August. She stated that she is going to be out of town for about a month but will call back when she gets back to schedule apt. Pt also advised that she will need to be fasting for her apt. She voiced understanding.

## 2016-10-03 ENCOUNTER — Other Ambulatory Visit: Payer: Self-pay | Admitting: Family Medicine

## 2016-10-03 NOTE — Telephone Encounter (Signed)
[place].  RF request for lorazepam LOV: 10/11/16 Next ov: 03/22/16 Last written: 02/02/16 #90 w/ 5RF  Please advise. Thanks.

## 2016-10-03 NOTE — Telephone Encounter (Signed)
I'll rx 30 day supply--she is overdue for diabetes and anxiety follow up.

## 2016-10-04 NOTE — Telephone Encounter (Signed)
Pt advised and voiced understanding.  Apt made for 10/11/16 at 2:00pm.

## 2016-10-10 NOTE — Progress Notes (Signed)
Subjective:   Diana [name] is a 61 y.o. female who presents for an Initial Medicare Annual Wellness Visit.  Review of Systems    No ROS.  Medicare Wellness Visit. Additional risk factors are reflected in the social history.   Cardiac Risk Factors include: advanced age (>41mn, >>27women);diabetes mellitus;dyslipidemia;obesity (BMI >30kg/m2);sedentary lifestyle   Sleep patterns: Sleeps none-4-8 hours, "depends on the day".  Home Safety/Smoke Alarms: Feels safe in home. Smoke alarms in place.  Living environment; residence and Firearm Safety: Lives with husband in 2 story home.  Seat Belt Safety/Bike Helmet: Wears seat belt.   Female:   [id number]      Mammo-01/03/2015, benign. Ordered today. [facility]          Dexa scan-12/21/2010, Osteopenia.         [facility]-Pt reports negative Hemoccult cards x 2.      Objective:    Today's Vitals   10/11/16 1308  BP: (!) 150/88  Pulse: (!) 116  Resp: 18  SpO2: 95%  Weight: 183 lb 1.9 oz (83.1 kg)  Height: _0  (1.575 m)  PainSc: 5    Body mass index is 33.49 kg/m.   Current Medications (verified) Outpatient Encounter Prescriptions as of 10/11/2016  Medication Sig  . amphetamine-dextroamphetamine (ADDERALL) 10 MG tablet 1 tab po bid  . atorvastatin (LIPITOR) 40 MG tablet TAKE (1) TABLET BY MOUTH AT BEDTIME.  .Marland KitchenB-D UF III MINI PEN NEEDLES 31G X 5 MM MISC USE AS DIRECTED UP TO 4 TIMES DAILY.  .Marland KitchenBiotin 5000 MCG TABS Take by mouth.  . blood glucose meter kit and supplies Use up to four times daily as directed.  . calcium elemental as carbonate (TUMS ULTRA 1000) 400 MG tablet Chew 1,000 mg by mouth daily.    . Cholecalciferol 2000 UNITS CAPS Take 1 capsule (2,000 Units total) by mouth daily.  .Marland Kitchenconjugated estrogens (PREMARIN) vaginal cream Place 1 Applicatorful vaginally daily.  . DULoxetine (CYMBALTA) 30 MG capsule TAKE (1) CAPSULE BY MOUTH TWICE DAILY.  .Marland Kitchengabapentin (NEURONTIN) 300 MG capsule TAKE 4 CAPSULES BY MOUTH AT BEDTIME.  .Marland Kitchen glucose blood (ACCU-CHEK AVIVA PLUS) test strip Use to check glucose 4 times per day  . HYDROcodone-acetaminophen (NORCO/VICODIN) 5-325 MG tablet Take 1-2 tablets by mouth every 6 (six) hours as needed for moderate pain.  . Insulin Pen Needle 31G X 6 MM MISC Use as directed  . lamoTRIgine (LAMICTAL) 100 MG tablet TAKE 1 TABLET BY MOUTH TWICE DAILY.  .Marland KitchenLEVEMIR FLEXTOUCH 100 UNIT/ML Pen INJECT 49 UNITS S.Q. AT BEDTIME.  . LORazepam (ATIVAN) 2 MG tablet TAKE (1) TABLET BY MOUTH EVERY EIGHT HOURS AS NEEDED.  . mirtazapine (REMERON) 30 MG tablet TAKE (1) TABLET BY MOUTH AT BEDTIME.  .Marland KitchentiZANidine (ZANAFLEX) 4 MG tablet Take 1 tablet by mouth 2 (two) times daily.   .Marland KitchenNOVOLOG FLEXPEN 100 UNIT/ML FlexPen INJECT 15 UNITS UNDER THE SKIN BEFORE MEALS.  .Marland Kitchenvarenicline (CHANTIX CONTINUING MONTH PAK) 1 MG tablet Take 1 tablet (1 mg total) by mouth 2 (two) times daily. (Patient not taking: Reported on 10/11/2016)  . varenicline (CHANTIX STARTING MONTH PAK) 0.5 MG X 11 & 1 MG X 42 tablet Take one 0.5 mg tablet by mouth once daily for 3 days, then increase to one 0.5 mg tablet twice daily for 4 days, then increase to one 1 mg tablet twice daily. (Patient not taking: Reported on 10/11/2016)   No facility-administered encounter medications on file as of 10/11/2016.  Allergies (verified) Aspirin; Codeine; Latex; and Penicillins   History: Past Medical History:  Diagnosis Date  . Abnormal mammogram 12/2014   F/u diagnostic mammo and u/s NORMAL--resume annual screening mammography  . Bipolar disorder ([place])    questionable  . Chronic fatigue   . Chronic nausea 2012/13   Normal upper GI  12/2011 (mild GERD noted +small hiatal hernia)+  . Chronic neck pain    cervical spondylosis on 2013 MRI  . Chronic pain syndrome    Dr. Francesco Runner at [facility] was her pain mgmt MD and he discharged her from [facility] pain mgmt center b/c she failed a UDS (+marijuana) 04/27/15  . Chronic renal insufficiency, stage II (mild) 02/2015   GFR  70s  . Colon cancer screening 03/06/2011   iFob 11/2010 through her insurer was negative. iFOB 01/2015 NEG.  . Diabetes mellitus    urine protein-Cr ratio nl 09/2010, D.R. Screen neg 10/2009  . Fracture of rib of left side 08/14/15   5th and 6th  . Hepatic steatosis 2005   ultrasound  . Hyperlipidemia, mixed 2017   Waiting to start statin (trying to take one thing at a time with her, working on glucose control first)  . Insomnia   . Iron deficiency anemia 09/2009   malabsorbtion s/p bariatric surgery  . Nephrolithiasis 11/2012  . Neurosis, anxiety, panic type   . Obesity    s/p bariatric surgery  . Osteopenia 12/2010   Hip T score -2.0.  Spine T score -0.5 (plan to repeat in 2 yrs)  . Other B-complex deficiencies   . Restless legs syndrome   . Tobacco dependence   . Vitamin D deficiency 09/2009   Past Surgical History:  Procedure Laterality Date  . ABDOMINAL HYSTERECTOMY     fibroids, ovaries remain  . APPENDECTOMY    . BLADDER SURGERY     bladder tack  . CERVICAL SPINE SURGERY  09/2009  . CHOLECYSTECTOMY  04/2010   w/lysis of adhesions (open procedure)--path showed chronic cholecystitis and cholesterol polyp.  . CYSTOSCOPY/RETROGRADE/URETEROSCOPY/STONE EXTRACTION WITH BASKET  11/2012   Left UVJ stone  . CYSTOSCOPY/RETROGRADE/URETEROSCOPY/STONE EXTRACTION WITH BASKET Left 11/21/2012   Procedure: CYSTOSCOPY/RETROGRADE/URETEROSCOPY/STONE EXTRACTION WITH BASKET insertion double j stent;  Surgeon: Ailene Rud, MD;  Location: [facility];  Service: Urology;  Laterality: Left;  . EPIDURAL BLOCK INJECTION  03/2011   Cervical  . HERNIA REPAIR  2005   mesenteric hernia repair, with lysis of adhesions (presented with SBO)  . ROUX-EN-Y GASTRIC BYPASS    . TONSILLECTOMY     Family History  Problem Relation Age of Onset  . Heart disease Mother   . Cancer Mother   . Heart disease Father   . Cancer Sister   . Heart disease Sister   . Alzheimer's disease Brother   . Kidney disease  Brother    Social History   Occupational History  . Not on file.   Social History Main Topics  . Smoking status: Former Smoker    Packs/day: 1.00    Years: 1.00    Types: Cigarettes    Quit date: 04/11/2016  . Smokeless tobacco: Never Used  . Alcohol use No  . Drug use: No  . Sexual activity: Not on file    Tobacco Counseling Counseling given: Not Answered   Activities of Daily Living In your present state of health, do you have any difficulty performing the following activities: 10/11/2016  Hearing? N  Vision? N  Difficulty concentrating or making decisions? N  Walking  or climbing stairs? Y  Comment joint pain  Dressing or bathing? N  Doing errands, shopping? N  Preparing Food and eating ? N  Using the Toilet? N  In the past six months, have you accidently leaked urine? N  Do you have problems with loss of bowel control? N  Managing your Medications? N  Managing your Finances? N  Housekeeping or managing your Housekeeping? N  Some recent data might be hidden    Immunizations and Health Maintenance Immunization History  Administered Date(s) Administered  . Influenza Split 11/02/2010  . Influenza Whole 02/11/2010  . Influenza,inj,Quad PF,6+ Mos 11/19/2012, 10/20/2014, 11/10/2015  . Pneumococcal Polysaccharide-23 11/10/2015  . Td 02/12/2003  . Tdap 06/16/2013   Health Maintenance Due  Topic Date Due  . HIV Screening  01/07/1971  . MAMMOGRAM  01/03/2016  . PNEUMOCOCCAL POLYSACCHARIDE VACCINE (2) 07/03/2016  . HEMOGLOBIN A1C  08/02/2016  . URINE MICROALBUMIN  08/07/2016  . INFLUENZA VACCINE  09/11/2016  . FOOT EXAM  09/28/2016   HIV ordered.  Mammogram ordered.   Patient Care Team: Tammi Sou, MD as PCP - General Dorene Ar, MD as Consulting Physician (Pain Medicine)  Indicate any recent Medical Services you may have received from other than [facility] providers in the past year (date may be approximate).     Assessment:   This is a routine  wellness examination for [place]. Physical assessment deferred to PCP.   Hearing/Vision screen Hearing Screening Comments: Able to hear conversational tones w/o difficulty. No issues reported.   Vision Screening Comments: Last exam > 4 years. Wears glasses to 20/20 vision.   Dietary issues and exercise activities discussed: Current Exercise Habits: The patient does not participate in regular exercise at present, Exercise limited by: orthopedic condition(s)   Diet (meal preparation, eat out, water intake, caffeinated beverages, dairy products, fruits and vegetables): Drinks water and diet soda.   Breakfast: skips Lunch: cereal and yogurt Dinner: protein and vegetable  Discussed diabetic diet and increasing activity.   Goals    . Weight (lb) < 150 lb (68 kg)          Lose weight by decreasing carbs.       Depression Screen PHQ 2/9 Scores 10/11/2016  PHQ - 2 Score 2  PHQ- 9 Score 14    Fall Risk Fall Risk  10/11/2016  Falls in the past year? Yes  Comment "passed out"  Number falls in past yr: 2 or more  Injury with Fall? Yes  Follow up Falls prevention discussed    Cognitive Function:       Ad8 score reviewed for issues:  Issues making decisions: no  Less interest in hobbies / activities: no  Repeats questions, stories (family complaining): no  Trouble using ordinary gadgets (microwave, computer, phone): no  Forgets the month or year: no  Mismanaging finances: no  Remembering appts: no  Daily problems with thinking and/or memory: no Ad8 score is=0     Screening Tests Health Maintenance  Topic Date Due  . HIV Screening  01/07/1971  . MAMMOGRAM  01/03/2016  . PNEUMOCOCCAL POLYSACCHARIDE VACCINE (2) 07/03/2016  . HEMOGLOBIN A1C  08/02/2016  . URINE MICROALBUMIN  08/07/2016  . INFLUENZA VACCINE  09/11/2016  . FOOT EXAM  09/28/2016  . OPHTHALMOLOGY EXAM  11/16/2016 (Originally 12/19/2013)  . TETANUS/TDAP  06/17/2023  . COLONOSCOPY  02/12/2025  .  Hepatitis C Screening  Completed   Mammogram ordered today.  Diabetic Foot Exam - Simple   Simple Foot Form  Diabetic Foot exam was performed with the following findings:  Yes 10/11/2016  1:55 PM  Visual Inspection No deformities, no ulcerations, no other skin breakdown bilaterally:  Yes Sensation Testing Intact to touch and monofilament testing bilaterally:  Yes Pulse Check Posterior Tibialis and Dorsalis pulse intact bilaterally:  Yes Comments Patient reports neuropathy 4 toes, both feet.        Plan:    Schedule mammogram and eye exam.   Bring a copy of your living will and/or healthcare power of attorney to your next office visit.  Continue doing brain stimulating activities (puzzles, reading, adult coloring books, staying active) to keep memory sharp.    I have personally reviewed and noted the following in the patient's chart:   . Medical and social history . Use of alcohol, tobacco or illicit drugs  . Current medications and supplements . Functional ability and status . Nutritional status . Physical activity . Advanced directives . List of other physicians . Hospitalizations, surgeries, and ER visits in previous 12 months . Vitals . Screenings to include cognitive, depression, and falls . Referrals and appointments  In addition, I have reviewed and discussed with patient certain preventive protocols, quality metrics, and best practice recommendations. A written personalized care plan for preventive services as well as general preventive health recommendations were provided to patient.     Gerilyn Nestle, RN   10/11/2016

## 2016-10-11 ENCOUNTER — Encounter: Payer: Self-pay | Admitting: Family Medicine

## 2016-10-11 ENCOUNTER — Ambulatory Visit (INDEPENDENT_AMBULATORY_CARE_PROVIDER_SITE_OTHER): Payer: Medicare HMO | Admitting: Family Medicine

## 2016-10-11 ENCOUNTER — Ambulatory Visit: Payer: Medicare HMO

## 2016-10-11 VITALS — BP 136/72 | HR 116 | Temp 97.3°F | Resp 18 | Ht 62.0 in | Wt 183.1 lb

## 2016-10-11 DIAGNOSIS — Z1239 Encounter for other screening for malignant neoplasm of breast: Secondary | ICD-10-CM

## 2016-10-11 DIAGNOSIS — R7989 Other specified abnormal findings of blood chemistry: Secondary | ICD-10-CM

## 2016-10-11 DIAGNOSIS — M5030 Other cervical disc degeneration, unspecified cervical region: Secondary | ICD-10-CM

## 2016-10-11 DIAGNOSIS — Z79899 Other long term (current) drug therapy: Secondary | ICD-10-CM

## 2016-10-11 DIAGNOSIS — E119 Type 2 diabetes mellitus without complications: Secondary | ICD-10-CM | POA: Diagnosis not present

## 2016-10-11 DIAGNOSIS — Z862 Personal history of diseases of the blood and blood-forming organs and certain disorders involving the immune mechanism: Secondary | ICD-10-CM

## 2016-10-11 DIAGNOSIS — F909 Attention-deficit hyperactivity disorder, unspecified type: Secondary | ICD-10-CM

## 2016-10-11 DIAGNOSIS — Z114 Encounter for screening for human immunodeficiency virus [HIV]: Secondary | ICD-10-CM

## 2016-10-11 DIAGNOSIS — R946 Abnormal results of thyroid function studies: Secondary | ICD-10-CM

## 2016-10-11 DIAGNOSIS — E7800 Pure hypercholesterolemia, unspecified: Secondary | ICD-10-CM

## 2016-10-11 DIAGNOSIS — M4722 Other spondylosis with radiculopathy, cervical region: Secondary | ICD-10-CM | POA: Diagnosis not present

## 2016-10-11 DIAGNOSIS — Z0000 Encounter for general adult medical examination without abnormal findings: Secondary | ICD-10-CM

## 2016-10-11 LAB — CBC WITH DIFFERENTIAL/PLATELET
BASOS PCT: 0.8 % (ref 0.0–3.0)
Basophils Absolute: 0.1 10*3/uL (ref 0.0–0.1)
EOS PCT: 2.1 % (ref 0.0–5.0)
Eosinophils Absolute: 0.2 10*3/uL (ref 0.0–0.7)
HCT: 41.9 % (ref 36.0–46.0)
HEMOGLOBIN: 13.9 g/dL (ref 12.0–15.0)
LYMPHS ABS: 2.7 10*3/uL (ref 0.7–4.0)
Lymphocytes Relative: 35.4 % (ref 12.0–46.0)
MCHC: 33.2 g/dL (ref 30.0–36.0)
MCV: 95.4 fl (ref 78.0–100.0)
MONOS PCT: 4.2 % (ref 3.0–12.0)
Monocytes Absolute: 0.3 10*3/uL (ref 0.1–1.0)
NEUTROS ABS: 4.4 10*3/uL (ref 1.4–7.7)
Neutrophils Relative %: 57.5 % (ref 43.0–77.0)
Platelets: 263 10*3/uL (ref 150.0–400.0)
RBC: 4.39 Mil/uL (ref 3.87–5.11)
RDW: 13.5 % (ref 11.5–15.5)
WBC: 7.7 10*3/uL (ref 4.0–10.5)

## 2016-10-11 LAB — COMPREHENSIVE METABOLIC PANEL
ALBUMIN: 4.4 g/dL (ref 3.5–5.2)
ALK PHOS: 110 U/L (ref 39–117)
ALT: 23 U/L (ref 0–35)
AST: 19 U/L (ref 0–37)
BILIRUBIN TOTAL: 0.4 mg/dL (ref 0.2–1.2)
BUN: 13 mg/dL (ref 6–23)
CO2: 30 mEq/L (ref 19–32)
Calcium: 9.7 mg/dL (ref 8.4–10.5)
Chloride: 99 mEq/L (ref 96–112)
Creatinine, Ser: 0.91 mg/dL (ref 0.40–1.20)
GFR: 67.08 mL/min (ref >60.00)
Glucose, Bld: 280 mg/dL — ABNORMAL HIGH (ref 70–99)
POTASSIUM: 4.4 meq/L (ref 3.5–5.1)
Sodium: 135 mEq/L (ref 135–145)
TOTAL PROTEIN: 7.7 g/dL (ref 6.0–8.3)

## 2016-10-11 LAB — MICROALBUMIN / CREATININE URINE RATIO
Creatinine,U: 92.2 mg/dL
Microalb Creat Ratio: 0.9 mg/g (ref 0.0–30.0)
Microalb, Ur: 0.8 mg/dL (ref 0.0–1.9)

## 2016-10-11 LAB — LIPID PANEL
CHOL/HDL RATIO: 4
Cholesterol: 160 mg/dL (ref 0–200)
HDL: 41.5 mg/dL (ref >39.00)
LDL Cholesterol: 95 mg/dL (ref 0–99)
NONHDL: 118.9
Triglycerides: 120 mg/dL (ref 0.0–149.0)
VLDL: 24 mg/dL (ref 0.0–40.0)

## 2016-10-11 LAB — TSH: TSH: 1.85 u[IU]/mL (ref 0.35–4.50)

## 2016-10-11 LAB — HEMOGLOBIN A1C: HEMOGLOBIN A1C: 12.8 % — AB (ref 4.6–6.5)

## 2016-10-11 LAB — T4, FREE: Free T4: 0.96 ng/dL (ref 0.60–1.60)

## 2016-10-11 MED ORDER — AMPHETAMINE-DEXTROAMPHETAMINE 10 MG PO TABS: ORAL_TABLET | ORAL | 0 refills | Status: DC

## 2016-10-11 MED ORDER — LORAZEPAM 2 MG PO TABS: ORAL_TABLET | ORAL | 2 refills | Status: DC

## 2016-10-11 MED ORDER — INSULIN ASPART 100 UNIT/ML ~~LOC~~ SOLN: SUBCUTANEOUS | 5 refills | Status: DC

## 2016-10-11 MED ORDER — INSULIN DETEMIR 100 UNIT/ML ~~LOC~~ SOLN
SUBCUTANEOUS | 5 refills | Status: DC
Start: 2016-10-11 — End: 2017-05-22

## 2016-10-11 NOTE — Patient Instructions (Addendum)
Increase your levemir to 70 U every evening.  Increase this insulin by 1 unit every night until your FASTING glucose is in the range of 100-120. Increase your novolog to 18 units at each meal.  Increase this insulin by 1 unit at each meal daily until glucose 2 hours after the meal is 140-170 range.     Schedule mammogram and eye exam.   Bring a copy of your living will and/or healthcare power of attorney to your next office visit.  Continue doing brain stimulating activities (puzzles, reading, adult coloring books, staying active) to keep memory sharp.    Health Maintenance, Female Adopting a healthy lifestyle and getting preventive care can go a long way to promote health and wellness. Talk with your health care provider about what schedule of regular examinations is right for you. This is a good chance for you to check in with your provider about disease prevention and staying healthy. In between checkups, there are plenty of things you can do on your own. Experts have done a lot of research about which lifestyle changes and preventive measures are most likely to keep you healthy. Ask your health care provider for more information. Weight and diet Eat a healthy diet  Be sure to include plenty of vegetables, fruits, low-fat dairy products, and lean protein.  Do not eat a lot of foods high in solid fats, added sugars, or salt.  Get regular exercise. This is one of the most important things you can do for your health. ? Most adults should exercise for at least 150 minutes each week. The exercise should increase your heart rate and make you sweat (moderate-intensity exercise). ? Most adults should also do strengthening exercises at least twice a week. This is in addition to the moderate-intensity exercise.  Maintain a healthy weight  Body mass index (BMI) is a measurement that can be used to identify possible weight problems. It estimates body fat based on height and weight. Your health  care provider can help determine your BMI and help you achieve or maintain a healthy weight.  For females 19 years of age and older: ? A BMI below 18.5 is considered underweight. ? A BMI of 18.5 to 24.9 is normal. ? A BMI of 25 to 29.9 is considered overweight. ? A BMI of 30 and above is considered obese.  Watch levels of cholesterol and blood lipids  You should start having your blood tested for lipids and cholesterol at 60 years of age, then have this test every 5 years.  You may need to have your cholesterol levels checked more often if: ? Your lipid or cholesterol levels are high. ? You are older than 60 years of age. ? You are at high risk for heart disease.  Cancer screening Lung Cancer  Lung cancer screening is recommended for adults 36-71 years old who are at high risk for lung cancer because of a history of smoking.  A yearly low-dose CT scan of the lungs is recommended for people who: ? Currently smoke. ? Have quit within the past 15 years. ? Have at least a 30-pack-year history of smoking. A pack year is smoking an average of one pack of cigarettes a day for 1 year.  Yearly screening should continue until it has been 15 years since you quit.  Yearly screening should stop if you develop a health problem that would prevent you from having lung cancer treatment.  Breast Cancer  Practice breast self-awareness. This means understanding how your  breasts normally appear and feel.  It also means doing regular breast self-exams. Let your health care provider know about any changes, no matter how small.  If you are in your 20s or 30s, you should have a clinical breast exam (CBE) by a health care provider every 1-3 years as part of a regular health exam.  If you are 45 or older, have a CBE every year. Also consider having a breast X-ray (mammogram) every year.  If you have a family history of breast cancer, talk to your health care provider about genetic screening.  If you  are at high risk for breast cancer, talk to your health care provider about having an MRI and a mammogram every year.  Breast cancer gene (BRCA) assessment is recommended for women who have family members with BRCA-related cancers. BRCA-related cancers include: ? Breast. ? Ovarian. ? Tubal. ? Peritoneal cancers.  Results of the assessment will determine the need for genetic counseling and BRCA1 and BRCA2 testing.  Cervical Cancer Your health care provider may recommend that you be screened regularly for cancer of the pelvic organs (ovaries, uterus, and vagina). This screening involves a pelvic examination, including checking for microscopic changes to the surface of your cervix (Pap test). You may be encouraged to have this screening done every 3 years, beginning at age 6.  For women ages 92-65, health care providers may recommend pelvic exams and Pap testing every 3 years, or they may recommend the Pap and pelvic exam, combined with testing for human papilloma virus (HPV), every 5 years. Some types of HPV increase your risk of cervical cancer. Testing for HPV may also be done on women of any age with unclear Pap test results.  Other health care providers may not recommend any screening for nonpregnant women who are considered low risk for pelvic cancer and who do not have symptoms. Ask your health care provider if a screening pelvic exam is right for you.  If you have had past treatment for cervical cancer or a condition that could lead to cancer, you need Pap tests and screening for cancer for at least 20 years after your treatment. If Pap tests have been discontinued, your risk factors (such as having a new sexual partner) need to be reassessed to determine if screening should resume. Some women have medical problems that increase the chance of getting cervical cancer. In these cases, your health care provider may recommend more frequent screening and Pap tests.  Colorectal Cancer  This type  of cancer can be detected and often prevented.  Routine colorectal cancer screening usually begins at 60 years of age and continues through 60 years of age.  Your health care provider may recommend screening at an earlier age if you have risk factors for colon cancer.  Your health care provider may also recommend using home test kits to check for hidden blood in the stool.  A small camera at the end of a tube can be used to examine your colon directly (sigmoidoscopy or colonoscopy). This is done to check for the earliest forms of colorectal cancer.  Routine screening usually begins at age 16.  Direct examination of the colon should be repeated every 5-10 years through 60 years of age. However, you may need to be screened more often if early forms of precancerous polyps or small growths are found.  Skin Cancer  Check your skin from head to toe regularly.  Tell your health care provider about any new moles or changes in  moles, especially if there is a change in a mole's shape or color.  Also tell your health care provider if you have a mole that is larger than the size of a pencil eraser.  Always use sunscreen. Apply sunscreen liberally and repeatedly throughout the day.  Protect yourself by wearing long sleeves, pants, a wide-brimmed hat, and sunglasses whenever you are outside.  Heart disease, diabetes, and high blood pressure  High blood pressure causes heart disease and increases the risk of stroke. High blood pressure is more likely to develop in: ? People who have blood pressure in the high end of the normal range (130-139/85-89 mm Hg). ? People who are overweight or obese. ? People who are African American.  If you are 54-1 years of age, have your blood pressure checked every 3-5 years. If you are 23 years of age or older, have your blood pressure checked every year. You should have your blood pressure measured twice-once when you are at a hospital or clinic, and once when you  are not at a hospital or clinic. Record the average of the two measurements. To check your blood pressure when you are not at a hospital or clinic, you can use: ? An automated blood pressure machine at a pharmacy. ? A home blood pressure monitor.  If you are between 13 years and 36 years old, ask your health care provider if you should take aspirin to prevent strokes.  Have regular diabetes screenings. This involves taking a blood sample to check your fasting blood sugar level. ? If you are at a normal weight and have a low risk for diabetes, have this test once every three years after 60 years of age. ? If you are overweight and have a high risk for diabetes, consider being tested at a younger age or more often. Preventing infection Hepatitis B  If you have a higher risk for hepatitis B, you should be screened for this virus. You are considered at high risk for hepatitis B if: ? You were born in a country where hepatitis B is common. Ask your health care provider which countries are considered high risk. ? Your parents were born in a high-risk country, and you have not been immunized against hepatitis B (hepatitis B vaccine). ? You have HIV or AIDS. ? You use needles to inject street drugs. ? You live with someone who has hepatitis B. ? You have had sex with someone who has hepatitis B. ? You get hemodialysis treatment. ? You take certain medicines for conditions, including cancer, organ transplantation, and autoimmune conditions.  Hepatitis C  Blood testing is recommended for: ? Everyone born from [birth year] through 1964. ? Anyone with known risk factors for hepatitis C.  Sexually transmitted infections (STIs)  You should be screened for sexually transmitted infections (STIs) including gonorrhea and chlamydia if: ? You are sexually active and are younger than 60 years of age. ? You are older than 60 years of age and your health care provider tells you that you are at risk for this type of  infection. ? Your sexual activity has changed since you were last screened and you are at an increased risk for chlamydia or gonorrhea. Ask your health care provider if you are at risk.  If you do not have HIV, but are at risk, it may be recommended that you take a prescription medicine daily to prevent HIV infection. This is called pre-exposure prophylaxis (PrEP). You are considered at risk if: ? You are  sexually active and do not regularly use condoms or know the HIV status of your partner(s). ? You take drugs by injection. ? You are sexually active with a partner who has HIV.  Talk with your health care provider about whether you are at high risk of being infected with HIV. If you choose to begin PrEP, you should first be tested for HIV. You should then be tested every 3 months for as long as you are taking PrEP. Pregnancy  If you are premenopausal and you may become pregnant, ask your health care provider about preconception counseling.  If you may become pregnant, take 400 to 800 micrograms (mcg) of folic acid every day.  If you want to prevent pregnancy, talk to your health care provider about birth control (contraception). Osteoporosis and menopause  Osteoporosis is a disease in which the bones lose minerals and strength with aging. This can result in serious bone fractures. Your risk for osteoporosis can be identified using a bone density scan.  If you are 35 years of age or older, or if you are at risk for osteoporosis and fractures, ask your health care provider if you should be screened.  Ask your health care provider whether you should take a calcium or vitamin D supplement to lower your risk for osteoporosis.  Menopause may have certain physical symptoms and risks.  Hormone replacement therapy may reduce some of these symptoms and risks. Talk to your health care provider about whether hormone replacement therapy is right for you. Follow these instructions at home:  Schedule  regular health, dental, and eye exams.  Stay current with your immunizations.  Do not use any tobacco products including cigarettes, chewing tobacco, or electronic cigarettes.  If you are pregnant, do not drink alcohol.  If you are breastfeeding, limit how much and how often you drink alcohol.  Limit alcohol intake to no more than 1 drink per day for nonpregnant women. One drink equals 12 ounces of beer, 5 ounces of wine, or 1 ounces of hard liquor.  Do not use street drugs.  Do not share needles.  Ask your health care provider for help if you need support or information about quitting drugs.  Tell your health care provider if you often feel depressed.  Tell your health care provider if you have ever been abused or do not feel safe at home. This information is not intended to replace advice given to you by your health care provider. Make sure you discuss any questions you have with your health care provider. Document Released: 08/13/2010 Document Revised: 07/06/2015 Document Reviewed: 11/01/2014 Elsevier Interactive Patient Education  [employer].

## 2016-10-11 NOTE — Progress Notes (Signed)
OFFICE VISIT  10/14/2016   CC:  Chief Complaint  Patient presents with  . Medicare Wellness  and routine f/u chronic illness--fasting today.  HPI:    Patient is a 61 y.o. Caucasian female who presents accompanied by her female significant other for f/u DM 2, hyperlipidemia, tobacco dependence, and left sided chest wall pain secondary to rib fractures sustained in 2017. I last saw her 6 months ago. We weened vicodin again, encouraged insulin up-titration, she was tolerating statin, and we started chantix for smoking cessation.  DM 2: glucoses avg 285 at least, range 180 ->500.  Using sliding scale novolog: 13--17 U per meal typically, highest 23.  Levemir 66 U qhs. Not eating diabetic diet very well.    HLD: taking statin daily, no side effects.  Tob dep: she completely quit smoking on chantix.  Pt says she tried to get appt with her neurosurg Dr. Carloyn Manner for persistent neck pain with bilat radiculopathy. +Paresthesias.    ROS: no exertional CP, No DOE, no myalgias.  No joint swelling.  +chronic depression,   Past Medical History:  Diagnosis Date  . Abnormal mammogram 12/2014   F/u diagnostic mammo and u/s NORMAL--resume annual screening mammography  . Bipolar disorder ([place])    questionable  . Chronic fatigue   . Chronic nausea 2012/13   Normal upper GI  12/2011 (mild GERD noted +small hiatal hernia)+  . Chronic neck pain    cervical spondylosis on 2013 MRI  . Chronic pain syndrome    Dr. Francesco Runner at [facility] was her pain mgmt MD and he discharged her from [facility] pain mgmt center b/c she failed a UDS (+marijuana) 04/27/15  . Chronic renal insufficiency, stage II (mild) 02/2015   GFR 70s  . Colon cancer screening 03/06/2011   iFob 11/2010 through her insurer was negative. iFOB 01/2015 NEG.  . Diabetes mellitus    urine protein-Cr ratio nl 09/2010, D.R. Screen neg 10/2009  . Fracture of rib of left side 08/14/15   5th and 6th  . Hepatic steatosis 2005   ultrasound  . Hyperlipidemia,  mixed 2017   Waiting to start statin (trying to take one thing at a time with her, working on glucose control first)  . Insomnia   . Iron deficiency anemia 09/2009   malabsorbtion s/p bariatric surgery  . Nephrolithiasis 11/2012  . Neurosis, anxiety, panic type   . Obesity    s/p bariatric surgery  . Osteopenia 12/2010   Hip T score -2.0.  Spine T score -0.5 (plan to repeat in 2 yrs)  . Other B-complex deficiencies   . Restless legs syndrome   . Tobacco dependence   . Vitamin D deficiency 09/2009    Past Surgical History:  Procedure Laterality Date  . ABDOMINAL HYSTERECTOMY     fibroids, ovaries remain  . APPENDECTOMY    . BLADDER SURGERY     bladder tack  . CERVICAL SPINE SURGERY  09/2009  . CHOLECYSTECTOMY  04/2010   w/lysis of adhesions (open procedure)--path showed chronic cholecystitis and cholesterol polyp.  . CYSTOSCOPY/RETROGRADE/URETEROSCOPY/STONE EXTRACTION WITH BASKET  11/2012   Left UVJ stone  . CYSTOSCOPY/RETROGRADE/URETEROSCOPY/STONE EXTRACTION WITH BASKET Left 11/21/2012   Procedure: CYSTOSCOPY/RETROGRADE/URETEROSCOPY/STONE EXTRACTION WITH BASKET insertion double j stent;  Surgeon: Ailene Rud, MD;  Location: [facility];  Service: Urology;  Laterality: Left;  . EPIDURAL BLOCK INJECTION  03/2011   Cervical  . HERNIA REPAIR  2005   mesenteric hernia repair, with lysis of adhesions (presented with SBO)  . ROUX-EN-Y  GASTRIC BYPASS    . TONSILLECTOMY      Outpatient Medications Prior to Visit  Medication Sig Dispense Refill  . atorvastatin (LIPITOR) 40 MG tablet TAKE (1) TABLET BY MOUTH AT BEDTIME. 90 tablet 3  . B-D UF III MINI PEN NEEDLES 31G X 5 MM MISC USE AS DIRECTED UP TO 4 TIMES DAILY. 100 each 11  . blood glucose meter kit and supplies Use up to four times daily as directed. 1 each 0  . calcium elemental as carbonate (TUMS ULTRA 1000) 400 MG tablet Chew 1,000 mg by mouth daily.      . Cholecalciferol 2000 UNITS CAPS Take 1 capsule (2,000 Units total) by  mouth daily. 30 each 11  . conjugated estrogens (PREMARIN) vaginal cream Place 1 Applicatorful vaginally daily. 30 g 12  . DULoxetine (CYMBALTA) 30 MG capsule TAKE (1) CAPSULE BY MOUTH TWICE DAILY. 60 capsule 1  . gabapentin (NEURONTIN) 300 MG capsule TAKE 4 CAPSULES BY MOUTH AT BEDTIME. 360 capsule 3  . glucose blood (ACCU-CHEK AVIVA PLUS) test strip Use to check glucose 4 times per day 100 each 12  . HYDROcodone-acetaminophen (NORCO/VICODIN) 5-325 MG tablet Take 1-2 tablets by mouth every 6 (six) hours as needed for moderate pain. 20 tablet 0  . Insulin Pen Needle 31G X 6 MM MISC Use as directed 100 each 6  . lamoTRIgine (LAMICTAL) 100 MG tablet TAKE 1 TABLET BY MOUTH TWICE DAILY. 60 tablet 0  . mirtazapine (REMERON) 30 MG tablet TAKE (1) TABLET BY MOUTH AT BEDTIME. 90 tablet 3  . tiZANidine (ZANAFLEX) 4 MG tablet Take 1 tablet by mouth 2 (two) times daily.     Marland Kitchen amphetamine-dextroamphetamine (ADDERALL) 10 MG tablet 1 tab po bid 60 tablet 0  . LEVEMIR FLEXTOUCH 100 UNIT/ML Pen INJECT 49 UNITS S.Q. AT BEDTIME. 15 mL 1  . LORazepam (ATIVAN) 2 MG tablet TAKE (1) TABLET BY MOUTH EVERY EIGHT HOURS AS NEEDED. 90 tablet 0  . varenicline (CHANTIX CONTINUING MONTH PAK) 1 MG tablet Take 1 tablet (1 mg total) by mouth 2 (two) times daily. (Patient not taking: Reported on 10/11/2016) 60 tablet 1  . varenicline (CHANTIX STARTING MONTH PAK) 0.5 MG X 11 & 1 MG X 42 tablet Take one 0.5 mg tablet by mouth once daily for 3 days, then increase to one 0.5 mg tablet twice daily for 4 days, then increase to one 1 mg tablet twice daily. (Patient not taking: Reported on 10/11/2016) 53 tablet 0  . NOVOLOG FLEXPEN 100 UNIT/ML FlexPen INJECT 15 UNITS UNDER THE SKIN BEFORE MEALS. 15 mL 1   No facility-administered medications prior to visit.     Allergies  Allergen Reactions  . Aspirin Other (See Comments)    Gastric Bypass  . Codeine     REACTION: itching  . Latex     blisters  . Penicillins     REACTION: took  large quantities as a child and was told to never take again    ROS As per HPI  PE: Blood pressure 136/72, pulse (!) 116, temperature (!) 97.3 F (36.3 C), temperature source Tympanic, resp. rate 18, height 5' 2"  (1.575 m), weight 183 lb 1.9 oz (83.1 kg), SpO2 95 %. Gen: Alert, well appearing.  Patient is oriented to person, place, time, and situation. AFFECT: pleasant, lucid thought and speech. No further exam today.  LABS:  Lab Results  Component Value Date   TSH 1.85 10/11/2016   Lab Results  Component Value Date   WBC  7.7 10/11/2016   HGB 13.9 10/11/2016   HCT 41.9 10/11/2016   MCV 95.4 10/11/2016   PLT 263.0 10/11/2016   Lab Results  Component Value Date   CREATININE 0.91 10/11/2016   BUN 13 10/11/2016   NA 135 10/11/2016   K 4.4 10/11/2016   CL 99 10/11/2016   CO2 30 10/11/2016   Lab Results  Component Value Date   ALT 23 10/11/2016   AST 19 10/11/2016   ALKPHOS 110 10/11/2016   BILITOT 0.4 10/11/2016   Lab Results  Component Value Date   CHOL 160 10/11/2016   Lab Results  Component Value Date   HDL 41.50 10/11/2016   Lab Results  Component Value Date   LDLCALC 95 10/11/2016   Lab Results  Component Value Date   TRIG 120.0 10/11/2016   Lab Results  Component Value Date   CHOLHDL 4 10/11/2016   Lab Results  Component Value Date   HGBA1C 12.8 (H) 10/11/2016   IMPRESSION AND PLAN:  1) DM 2: HbA1c Urine microalb/cr. Feet exam next visit done today at her Medicare AWV. Pt due for diab retipthy screening--she will schedule this. Increase levemir to 70 qhs, increase novolog to 18 U qAC. Instructions: Increase your levemir to 70 U every evening.  Increase this insulin by 1 unit every night until your FASTING glucose is in the range of 100-120. Increase your novolog to 18 units at each meal.  Increase this insulin by 1 unit at each meal daily until glucose 2 hours after the meal is 140-170 range.   2) HLD: tolerating statin.  FLP today. AST /  ALT today.  3) Depression and anxiety + adult ADD: fairly stable, no changes made today.  Lorazepam RF'd today. Adderall RF' today.  4) Tobacco dependence--successfully quit recently.  5) Chronic neck pain: cervical spondylosis with bilat radiculopathy and remote hx of C spine surgery. No narcotic pain meds--hx of d/c from pain clinic (Dr. Francesco Runner), and I will not manage her chronic pain with narcotics.  She requested referral to new neurosurgeon b/c her old one retired.  Referral ordered today.  5) Hx of mild leukocytosis: recheck CBC today.  6) Hx of mild TSH elevation/subclinical hypothyroidism: monitoring thyroid panel today.  An After Visit Summary was printed and given to the patient.  FOLLOW UP: Return in about 3 months (around 01/10/2017) for routine chronic illness f/u.  Signed:  Crissie Sickles, MD           10/14/2016

## 2016-10-12 LAB — T3: T3 TOTAL: 88.2 ng/dL (ref 76–181)

## 2016-10-12 LAB — HIV ANTIBODY (ROUTINE TESTING W REFLEX): HIV 1&2 Ab, 4th Generation: NONREACTIVE

## 2016-10-21 NOTE — Progress Notes (Signed)
AWV reviewed and agree.  Signed:  Crissie Sickles, MD           10/21/2016

## 2016-11-13 ENCOUNTER — Other Ambulatory Visit: Payer: Self-pay | Admitting: Family Medicine

## 2016-11-13 NOTE — Telephone Encounter (Signed)
[place]  RF request for lamotrigine LOV: 10/11/16 Next ov: 01/09/17 Last written: 05/20/16 #60 w/ 0RF  Please advise. Thanks.

## 2016-11-27 ENCOUNTER — Ambulatory Visit (HOSPITAL_COMMUNITY)
Admission: RE | Admit: 2016-11-27 | Discharge: 2016-11-27 | Disposition: A | Discharge status: Home / Self Care | Payer: Medicare HMO | Source: Ambulatory Visit | Attending: Family Medicine | Admitting: Family Medicine

## 2016-11-27 DIAGNOSIS — Z1231 Encounter for screening mammogram for malignant neoplasm of breast: Secondary | ICD-10-CM | POA: Insufficient documentation

## 2016-11-27 DIAGNOSIS — Z1239 Encounter for other screening for malignant neoplasm of breast: Secondary | ICD-10-CM

## 2016-11-28 DIAGNOSIS — M5412 Radiculopathy, cervical region: Secondary | ICD-10-CM | POA: Diagnosis not present

## 2016-11-29 ENCOUNTER — Encounter: Payer: Self-pay | Admitting: Family Medicine

## 2016-12-03 ENCOUNTER — Other Ambulatory Visit: Payer: Self-pay | Admitting: Neurosurgery

## 2016-12-03 DIAGNOSIS — M5412 Radiculopathy, cervical region: Secondary | ICD-10-CM

## 2016-12-12 ENCOUNTER — Ambulatory Visit
Admission: RE | Admit: 2016-12-12 | Discharge: 2016-12-12 | Disposition: A | Discharge status: Home / Self Care | Payer: Medicare HMO | Source: Ambulatory Visit | Attending: Neurosurgery | Admitting: Neurosurgery

## 2016-12-12 VITALS — BP 116/58 | HR 85

## 2016-12-12 DIAGNOSIS — M5412 Radiculopathy, cervical region: Secondary | ICD-10-CM

## 2016-12-12 DIAGNOSIS — M50221 Other cervical disc displacement at C4-C5 level: Secondary | ICD-10-CM | POA: Diagnosis not present

## 2016-12-12 DIAGNOSIS — M542 Cervicalgia: Secondary | ICD-10-CM

## 2016-12-12 MED ORDER — HYDROXYZINE HCL 50 MG/ML IM SOLN
25.0000 mg | Freq: Once | INTRAMUSCULAR | Status: AC
  Administered 2016-12-12: 25 mg via INTRAMUSCULAR

## 2016-12-12 MED ORDER — DIAZEPAM 5 MG PO TABS
10.0000 mg | ORAL_TABLET | Freq: Once | ORAL | Status: AC
  Administered 2016-12-12: 10 mg via ORAL

## 2016-12-12 MED ORDER — IOPAMIDOL (ISOVUE-M 300) INJECTION 61%
10.0000 mL | Freq: Once | INTRAMUSCULAR | Status: AC | PRN
  Administered 2016-12-12: 10 mL via INTRATHECAL

## 2016-12-12 MED ORDER — MEPERIDINE HCL 100 MG/ML IJ SOLN
75.0000 mg | Freq: Once | INTRAMUSCULAR | Status: AC
  Administered 2016-12-12: 75 mg via INTRAMUSCULAR

## 2016-12-12 NOTE — Discharge Instructions (Signed)
Myelogram Discharge Instructions  1. Go home and rest quietly for the next 24 hours.  It is important to lie flat for the next 24 hours.  Get up only to go to the restroom.  You may lie in the bed or on a couch on your back, your stomach, your left side or your right side.  You may have one pillow under your head.  You may have pillows between your knees while you are on your side or under your knees while you are on your back.  2. DO NOT drive today.  Recline the seat as far back as it will go, while still wearing your seat belt, on the way home.  3. You may get up to go to the bathroom as needed.  You may sit up for 10 minutes to eat.  You may resume your normal diet and medications unless otherwise indicated.  Drink lots of extra fluids today and tomorrow.  4. The incidence of headache, nausea, or vomiting is about 5% (one in 20 patients).  If you develop a headache, lie flat and drink plenty of fluids until the headache goes away.  Caffeinated beverages may be helpful.  If you develop severe nausea and vomiting or a headache that does not go away with flat bed rest, call [phone].  5. You may resume normal activities after your 24 hours of bed rest is over; however, do not exert yourself strongly or do any heavy lifting tomorrow. If when you get up you have a headache when standing, go back to bed and force fluids for another 24 hours.  6. Call your physician for a follow-up appointment.  The results of your myelogram will be sent directly to your physician by the following day.  7. If you have any questions or if complications develop after you arrive home, please call [phone].  Discharge instructions have been explained to the patient.  The patient, or the person responsible for the patient, fully understands these instructions.       May resume Cymbalta, Remeron and Adderall on Nov. 2, 2018, after 10:30 am.

## 2016-12-12 NOTE — Progress Notes (Signed)
Pt states she has been off Adderall, Cymbalta and Remeron since Sept.29, 2018.

## 2016-12-18 ENCOUNTER — Other Ambulatory Visit: Payer: Self-pay | Admitting: Neurosurgery

## 2016-12-18 DIAGNOSIS — S129XXA Fracture of neck, unspecified, initial encounter: Secondary | ICD-10-CM | POA: Diagnosis not present

## 2016-12-20 ENCOUNTER — Encounter: Payer: Self-pay | Admitting: Family Medicine

## 2016-12-25 NOTE — Pre-Procedure Instructions (Signed)
[name]  12/25/2016      [place], [place] [address] [name] [address] Phone: [phone] Fax: [phone]    Your procedure is scheduled on Tuesday November 20.  Report to [facility] Admitting at 8:50 A.M.  Call this number if you have problems the morning of surgery:  [phone]   Remember:  Do not eat food or drink liquids after midnight.  Take these medicines the morning of surgery with A SIP OF WATER:  Duloxetine (cymbalta) Lamotrigine (lamictal) Hydrocodone (Norco) if needed  7 days prior to surgery STOP taking any Aspirin, Aleve, Naproxen, Ibuprofen, Motrin, Advil, Goody's, BC's, all herbal medications, fish oil, and all vitamins  DO NOT TAKE amphetamine-dextroamphetamine (Adderall) the morning of surgery   WHAT DO I DO ABOUT MY DIABETES MEDICATION?  . THE NIGHT BEFORE SURGERY, take 39 units of insulin detemir (levemir) insulin.      . If your CBG is greater than 220 mg/dL, you may take  of your sliding scale (correction) dose of insulin. (Insulin aspart, Novolog) .    How to Manage Your Diabetes Before and After Surgery  Why is it important to control my blood sugar before and after surgery? . Improving blood sugar levels before and after surgery helps healing and can limit problems. . A way of improving blood sugar control is eating a healthy diet by: o  Eating less sugar and carbohydrates o  Increasing activity/exercise o  Talking with your doctor about reaching your blood sugar goals . High blood sugars (greater than 180 mg/dL) can raise your risk of infections and slow your recovery, so you will need to focus on controlling your diabetes during the weeks before surgery. . Make sure that the doctor who takes care of your diabetes knows about your planned surgery including the date and location.  How do I manage my blood sugar before surgery? . Check your blood sugar at  least 4 times a day, starting 2 days before surgery, to make sure that the level is not too high or low. o Check your blood sugar the morning of your surgery when you wake up and every 2 hours until you get to the Short Stay unit. . If your blood sugar is less than 70 mg/dL, you will need to treat for low blood sugar: o Do not take insulin. o Treat a low blood sugar (less than 70 mg/dL) with  cup of clear juice (cranberry or apple), 4 glucose tablets, OR glucose gel. Recheck blood sugar in 15 minutes after treatment (to make sure it is greater than 70 mg/dL). If your blood sugar is not greater than 70 mg/dL on recheck, call [phone] o  for further instructions. . Report your blood sugar to the short stay nurse when you get to Short Stay.  . If you are admitted to the [name] after surgery: o Your blood sugar will be checked by the staff and you will probably be given insulin after surgery (instead of oral diabetes medicines) to make sure you have good blood sugar levels. o The goal for blood sugar control after surgery is 80-180 mg/dL.                  Do not wear jewelry, make-up or nail polish.  Do not wear lotions, powders, or perfumes, or deoderant.  Do not shave 48 hours prior to surgery.  Men may shave face and neck.  Do  not bring valuables to the [name].  [name] is not responsible for any belongings or valuables.  Contacts, dentures or bridgework may not be worn into surgery.  Leave your suitcase in the car.  After surgery it may be brought to your room.  For patients admitted to the [name], discharge time will be determined by your treatment team.  Patients discharged the day of surgery will not be allowed to drive home.  Special instructions:    [name]- Preparing For Surgery  Before surgery, you can play an important role. Because skin is not sterile, your skin needs to be as free of germs as possible. You can reduce the number of germs on your  skin by washing with CHG (chlorahexidine gluconate) Soap before surgery.  CHG is an antiseptic cleaner which kills germs and bonds with the skin to continue killing germs even after washing.  Please do not use if you have an allergy to CHG or antibacterial soaps. If your skin becomes reddened/irritated stop using the CHG.  Do not shave (including legs and underarms) for at least 48 hours prior to first CHG shower. It is OK to shave your face.  Please follow these instructions carefully.   1. Shower the NIGHT BEFORE SURGERY and the MORNING OF SURGERY with CHG.   2. If you chose to wash your hair, wash your hair first as usual with your normal shampoo.  3. After you shampoo, rinse your hair and body thoroughly to remove the shampoo.  4. Use CHG as you would any other liquid soap. You can apply CHG directly to the skin and wash gently with a scrungie or a clean washcloth.   5. Apply the CHG Soap to your body ONLY FROM THE NECK DOWN.  Do not use on open wounds or open sores. Avoid contact with your eyes, ears, mouth and genitals (private parts). Wash Face and genitals (private parts)  with your normal soap.  6. Wash thoroughly, paying special attention to the area where your surgery will be performed.  7. Thoroughly rinse your body with warm water from the neck down.  8. DO NOT shower/wash with your normal soap after using and rinsing off the CHG Soap.  9. Pat yourself dry with a CLEAN TOWEL.  10. Wear CLEAN PAJAMAS to bed the night before surgery, wear comfortable clothes the morning of surgery  11. Place CLEAN SHEETS on your bed the night of your first shower and DO NOT SLEEP WITH PETS.    Day of Surgery: Do not apply any deodorants/lotions. Please wear clean clothes to the [name]surgery center.      Please read over the following fact sheets that you were given. Coughing and Deep Breathing, MRSA Information and Surgical Site Infection Prevention

## 2016-12-26 ENCOUNTER — Encounter (HOSPITAL_COMMUNITY)
Admission: RE | Admit: 2016-12-26 | Discharge: 2016-12-26 | Disposition: A | Discharge status: Home / Self Care | Payer: Medicare HMO | Source: Ambulatory Visit | Attending: Neurosurgery | Admitting: Neurosurgery

## 2016-12-26 ENCOUNTER — Other Ambulatory Visit: Payer: Self-pay

## 2016-12-26 ENCOUNTER — Encounter (HOSPITAL_COMMUNITY): Payer: Self-pay

## 2016-12-26 DIAGNOSIS — E785 Hyperlipidemia, unspecified: Secondary | ICD-10-CM | POA: Diagnosis not present

## 2016-12-26 DIAGNOSIS — Z01818 Encounter for other preprocedural examination: Secondary | ICD-10-CM | POA: Diagnosis not present

## 2016-12-26 DIAGNOSIS — E1122 Type 2 diabetes mellitus with diabetic chronic kidney disease: Secondary | ICD-10-CM | POA: Insufficient documentation

## 2016-12-26 DIAGNOSIS — Z0183 Encounter for blood typing: Secondary | ICD-10-CM | POA: Insufficient documentation

## 2016-12-26 DIAGNOSIS — F319 Bipolar disorder, unspecified: Secondary | ICD-10-CM | POA: Insufficient documentation

## 2016-12-26 DIAGNOSIS — R9431 Abnormal electrocardiogram [ECG] [EKG]: Secondary | ICD-10-CM | POA: Diagnosis not present

## 2016-12-26 DIAGNOSIS — Z01812 Encounter for preprocedural laboratory examination: Secondary | ICD-10-CM | POA: Diagnosis not present

## 2016-12-26 DIAGNOSIS — K760 Fatty (change of) liver, not elsewhere classified: Secondary | ICD-10-CM | POA: Insufficient documentation

## 2016-12-26 DIAGNOSIS — F419 Anxiety disorder, unspecified: Secondary | ICD-10-CM | POA: Diagnosis not present

## 2016-12-26 DIAGNOSIS — Z9884 Bariatric surgery status: Secondary | ICD-10-CM | POA: Diagnosis not present

## 2016-12-26 DIAGNOSIS — Z79899 Other long term (current) drug therapy: Secondary | ICD-10-CM | POA: Diagnosis not present

## 2016-12-26 DIAGNOSIS — Z794 Long term (current) use of insulin: Secondary | ICD-10-CM | POA: Insufficient documentation

## 2016-12-26 DIAGNOSIS — N182 Chronic kidney disease, stage 2 (mild): Secondary | ICD-10-CM | POA: Diagnosis not present

## 2016-12-26 DIAGNOSIS — Z87891 Personal history of nicotine dependence: Secondary | ICD-10-CM | POA: Diagnosis not present

## 2016-12-26 HISTORY — DX: Depression, unspecified: F32.A

## 2016-12-26 HISTORY — DX: Presence of dental prosthetic device (complete) (partial): Z97.2

## 2016-12-26 HISTORY — DX: Unspecified osteoarthritis, unspecified site: M19.90

## 2016-12-26 HISTORY — DX: Major depressive disorder, single episode, unspecified: F32.9

## 2016-12-26 HISTORY — DX: Panic disorder (episodic paroxysmal anxiety): F41.0

## 2016-12-26 HISTORY — DX: Syncope and collapse: R55

## 2016-12-26 HISTORY — DX: Personal history of urinary calculi: Z87.442

## 2016-12-26 HISTORY — DX: Fibromyalgia: M79.7

## 2016-12-26 LAB — BASIC METABOLIC PANEL
ANION GAP: 9 (ref 5–15)
BUN: 15 mg/dL (ref 6–20)
CALCIUM: 9.5 mg/dL (ref 8.9–10.3)
CO2: 25 mmol/L (ref 22–32)
CREATININE: 1.05 mg/dL — AB (ref 0.44–1.00)
Chloride: 101 mmol/L (ref 101–111)
GFR calc Af Amer: >60 mL/min (ref >60)
GFR, EST NON AFRICAN AMERICAN: 57 mL/min — AB (ref >60)
GLUCOSE: 360 mg/dL — AB (ref 65–99)
Potassium: 4.4 mmol/L (ref 3.5–5.1)
Sodium: 135 mmol/L (ref 135–145)

## 2016-12-26 LAB — CBC WITH DIFFERENTIAL/PLATELET
BASOS ABS: 0 10*3/uL (ref 0.0–0.1)
BASOS PCT: 0 %
EOS ABS: 0.3 10*3/uL (ref 0.0–0.7)
EOS PCT: 4 %
HCT: 42.8 % (ref 36.0–46.0)
Hemoglobin: 14.4 g/dL (ref 12.0–15.0)
Lymphocytes Relative: 38 %
Lymphs Abs: 3.4 10*3/uL (ref 0.7–4.0)
MCH: 32.2 pg (ref 26.0–34.0)
MCHC: 33.6 g/dL (ref 30.0–36.0)
MCV: 95.7 fL (ref 78.0–100.0)
MONO ABS: 0.4 10*3/uL (ref 0.1–1.0)
MONOS PCT: 4 %
Neutro Abs: 4.9 10*3/uL (ref 1.7–7.7)
Neutrophils Relative %: 54 %
PLATELETS: 264 10*3/uL (ref 150–400)
RBC: 4.47 MIL/uL (ref 3.87–5.11)
RDW: 13.2 % (ref 11.5–15.5)
WBC: 9.1 10*3/uL (ref 4.0–10.5)

## 2016-12-26 LAB — URINALYSIS, ROUTINE W REFLEX MICROSCOPIC
BACTERIA UA: NONE SEEN
BILIRUBIN URINE: NEGATIVE
HGB URINE DIPSTICK: NEGATIVE
Ketones, ur: NEGATIVE mg/dL
NITRITE: NEGATIVE
PROTEIN: NEGATIVE mg/dL
SPECIFIC GRAVITY, URINE: 1.027 (ref 1.005–1.030)
pH: 5 (ref 5.0–8.0)

## 2016-12-26 LAB — SURGICAL PCR SCREEN
MRSA, PCR: NEGATIVE
STAPHYLOCOCCUS AUREUS: NEGATIVE

## 2016-12-26 LAB — TYPE AND SCREEN
ABO/RH(D): O POS
Antibody Screen: NEGATIVE

## 2016-12-26 LAB — ABO/RH: ABO/RH(D): O POS

## 2016-12-26 LAB — HEMOGLOBIN A1C
Hgb A1c MFr Bld: 11.2 % — ABNORMAL HIGH (ref 4.8–5.6)
Mean Plasma Glucose: 274.74 mg/dL

## 2016-12-26 LAB — GLUCOSE, CAPILLARY: GLUCOSE-CAPILLARY: 318 mg/dL — AB (ref 65–99)

## 2016-12-26 NOTE — Progress Notes (Signed)
PCP: Dr. Patrcia Dolly, [facility], last saw 4 months ago, he manages DM Cardiologist: Denies  EKG: Today CXR: Denies ECHO: Denies Stress Test: Denies Cardiac Cath: Denies  Fasting Blood Sugar- "Can't get it under 250" Checks Blood Sugar "after meals, sometimes before"  Pt with elevated heart rate and CBG at PAT appt.  Pt did not take insulin this AM prior to appt. Levada Dy NP saw during PAT appt.   Pt reports she gets panic attacks.  Reports the oxygen tubing causes blisters in nose and she must use "conditioner or vasoline" on tubing prior to placing in nose.  Pt reports feeling symptoms of UTI, Angela NP orders urine sample.  Patient denies shortness of breath, fever, cough, and chest pain at PAT appointment.  Patient verbalized understanding of instructions provided today at the PAT appointment.  Patient asked to review instructions at home and day of surgery.

## 2016-12-27 ENCOUNTER — Encounter (HOSPITAL_COMMUNITY): Payer: Self-pay

## 2016-12-27 NOTE — Progress Notes (Signed)
Anesthesia Chart Review:  Pt is a 61 year old female scheduled for C4-5, C5-6 posterior cervical fusion with lateral mass fixation on 12/31/2016 with Earnie Larsson, MD  - PCP is Patrcia Dolly, MD. Last office visit 10/11/16  PMH includes:  DM, hyperlipidemia, CKD (stage 2), hepatic steatosis, iron deficiency anemia, bipolar disorder, anxiety, post-op N/V. Former smoker (quit 04/11/16). BMI 30. S/p bariatric surgery.   Medications include: Adderall, Lipitor, NovoLog, Levemir  BP (!) 156/60   Pulse (!) 112 Comment: notified Sarah RN  Temp 36.9 C   Resp 20   Ht 5' 5.5" (1.664 m)   Wt 184 lb 12.8 oz (83.8 kg)   SpO2 100%   BMI 30.28 kg/m   Preoperative labs reviewed.   - HbA1c 11.2, glucose 360.  Pt did not take insulin as prescribed morning of pre-admission testing.  - Pt reports fasting blood glucose is never under 250.   EKG 12/26/16: Sinus tachycardia (105 bpm). T wave abnormality, consider inferior ischemia  - I saw pt in pre-admission testing for tachycardia. Pt reports she is tachycardic when anxious, and she was anxious at PAT about surgery.  She reports HR at home (when she is not anxious) is not rapid. Review of previous encounters at PCP's office shows HR typically > 100 on vital signs (range 99-116)  Reviewed case with Dr. Linna Caprice. I called pt and reiterated careful diet choices and adherence to medicines from now until surgery.  She understands her surgery may be cancelled if glucose >200.   If glucose acceptable day of surgery, I anticipate pt can proceed with surgery as scheduled.   Willeen Cass, FNP-BC [facility]/Anesthesiology Phone: [phone] 12/27/2016 3:11 PM

## 2016-12-31 ENCOUNTER — Other Ambulatory Visit: Payer: Self-pay

## 2016-12-31 ENCOUNTER — Inpatient Hospital Stay (HOSPITAL_COMMUNITY): Payer: Medicare HMO | Admitting: Certified Registered Nurse Anesthetist

## 2016-12-31 ENCOUNTER — Inpatient Hospital Stay (HOSPITAL_COMMUNITY): Payer: Medicare HMO | Admitting: Emergency Medicine

## 2016-12-31 ENCOUNTER — Inpatient Hospital Stay (HOSPITAL_COMMUNITY)
Admission: RE | Admit: 2016-12-31 | Discharge: 2017-01-01 | DRG: 472 | Disposition: A | Discharge status: Home / Self Care | Payer: Medicare HMO | Source: Ambulatory Visit | Attending: Neurosurgery | Admitting: Neurosurgery

## 2016-12-31 ENCOUNTER — Encounter (HOSPITAL_COMMUNITY): Payer: Self-pay | Admitting: *Deleted

## 2016-12-31 ENCOUNTER — Inpatient Hospital Stay (HOSPITAL_COMMUNITY): Payer: Medicare HMO

## 2016-12-31 ENCOUNTER — Encounter (HOSPITAL_COMMUNITY)
Admission: RE | Disposition: A | Discharge status: Home / Self Care | Payer: Self-pay | Source: Ambulatory Visit | Attending: Neurosurgery

## 2016-12-31 DIAGNOSIS — Z886 Allergy status to analgesic agent status: Secondary | ICD-10-CM | POA: Diagnosis not present

## 2016-12-31 DIAGNOSIS — M8580 Other specified disorders of bone density and structure, unspecified site: Secondary | ICD-10-CM | POA: Diagnosis present

## 2016-12-31 DIAGNOSIS — Z885 Allergy status to narcotic agent status: Secondary | ICD-10-CM

## 2016-12-31 DIAGNOSIS — Z882 Allergy status to sulfonamides status: Secondary | ICD-10-CM | POA: Diagnosis not present

## 2016-12-31 DIAGNOSIS — Z23 Encounter for immunization: Secondary | ICD-10-CM

## 2016-12-31 DIAGNOSIS — M960 Pseudarthrosis after fusion or arthrodesis: Secondary | ICD-10-CM | POA: Diagnosis not present

## 2016-12-31 DIAGNOSIS — G2581 Restless legs syndrome: Secondary | ICD-10-CM | POA: Diagnosis not present

## 2016-12-31 DIAGNOSIS — F410 Panic disorder [episodic paroxysmal anxiety] without agoraphobia: Secondary | ICD-10-CM | POA: Diagnosis present

## 2016-12-31 DIAGNOSIS — N182 Chronic kidney disease, stage 2 (mild): Secondary | ICD-10-CM | POA: Diagnosis not present

## 2016-12-31 DIAGNOSIS — K219 Gastro-esophageal reflux disease without esophagitis: Secondary | ICD-10-CM | POA: Diagnosis not present

## 2016-12-31 DIAGNOSIS — E1122 Type 2 diabetes mellitus with diabetic chronic kidney disease: Secondary | ICD-10-CM | POA: Diagnosis not present

## 2016-12-31 DIAGNOSIS — M4322 Fusion of spine, cervical region: Secondary | ICD-10-CM | POA: Diagnosis not present

## 2016-12-31 DIAGNOSIS — E782 Mixed hyperlipidemia: Secondary | ICD-10-CM | POA: Diagnosis not present

## 2016-12-31 DIAGNOSIS — Z91040 Latex allergy status: Secondary | ICD-10-CM

## 2016-12-31 DIAGNOSIS — E785 Hyperlipidemia, unspecified: Secondary | ICD-10-CM | POA: Diagnosis not present

## 2016-12-31 DIAGNOSIS — G894 Chronic pain syndrome: Secondary | ICD-10-CM | POA: Diagnosis not present

## 2016-12-31 DIAGNOSIS — M4802 Spinal stenosis, cervical region: Secondary | ICD-10-CM | POA: Diagnosis not present

## 2016-12-31 DIAGNOSIS — Z87891 Personal history of nicotine dependence: Secondary | ICD-10-CM | POA: Diagnosis not present

## 2016-12-31 DIAGNOSIS — Z880 Allergy status to penicillin: Secondary | ICD-10-CM

## 2016-12-31 DIAGNOSIS — M797 Fibromyalgia: Secondary | ICD-10-CM | POA: Diagnosis not present

## 2016-12-31 DIAGNOSIS — N1330 Unspecified hydronephrosis: Secondary | ICD-10-CM | POA: Diagnosis not present

## 2016-12-31 DIAGNOSIS — F319 Bipolar disorder, unspecified: Secondary | ICD-10-CM | POA: Diagnosis present

## 2016-12-31 DIAGNOSIS — S129XXA Fracture of neck, unspecified, initial encounter: Secondary | ICD-10-CM | POA: Diagnosis present

## 2016-12-31 DIAGNOSIS — Z419 Encounter for procedure for purposes other than remedying health state, unspecified: Secondary | ICD-10-CM

## 2016-12-31 DIAGNOSIS — Z794 Long term (current) use of insulin: Secondary | ICD-10-CM | POA: Diagnosis not present

## 2016-12-31 DIAGNOSIS — D509 Iron deficiency anemia, unspecified: Secondary | ICD-10-CM | POA: Diagnosis not present

## 2016-12-31 DIAGNOSIS — M50221 Other cervical disc displacement at C4-C5 level: Secondary | ICD-10-CM | POA: Diagnosis not present

## 2016-12-31 HISTORY — PX: POSTERIOR CERVICAL FUSION/FORAMINOTOMY: SHX5038

## 2016-12-31 LAB — GLUCOSE, CAPILLARY
GLUCOSE-CAPILLARY: 265 mg/dL — AB (ref 65–99)
GLUCOSE-CAPILLARY: 323 mg/dL — AB (ref 65–99)
GLUCOSE-CAPILLARY: 374 mg/dL — AB (ref 65–99)
Glucose-Capillary: 210 mg/dL — ABNORMAL HIGH (ref 65–99)

## 2016-12-31 SURGERY — POSTERIOR CERVICAL FUSION/FORAMINOTOMY LEVEL 2
Anesthesia: General | Site: Neck

## 2016-12-31 MED ORDER — BUPIVACAINE HCL (PF) 0.25 % IJ SOLN
INTRAMUSCULAR | Status: AC
  Filled 2016-12-31: qty 30

## 2016-12-31 MED ORDER — THROMBIN (RECOMBINANT) 5000 UNITS EX SOLR
CUTANEOUS | Status: AC
  Filled 2016-12-31: qty 10000

## 2016-12-31 MED ORDER — PROMETHAZINE HCL 25 MG/ML IJ SOLN
INTRAMUSCULAR | Status: AC
  Filled 2016-12-31: qty 1

## 2016-12-31 MED ORDER — GLYCOPYRROLATE 0.2 MG/ML IJ SOLN
INTRAMUSCULAR | Status: DC | PRN
  Administered 2016-12-31: 0.2 mg via INTRAVENOUS

## 2016-12-31 MED ORDER — BACITRACIN ZINC 500 UNIT/GM EX OINT
TOPICAL_OINTMENT | CUTANEOUS | Status: AC
  Filled 2016-12-31: qty 28.35

## 2016-12-31 MED ORDER — MIDAZOLAM HCL 2 MG/2ML IJ SOLN
INTRAMUSCULAR | Status: AC
  Filled 2016-12-31: qty 2

## 2016-12-31 MED ORDER — VANCOMYCIN HCL IN DEXTROSE 1-5 GM/200ML-% IV SOLN: 1000.0000 mg | INTRAVENOUS | Status: DC

## 2016-12-31 MED ORDER — ESMOLOL HCL 100 MG/10ML IV SOLN
INTRAVENOUS | Status: AC
  Filled 2016-12-31: qty 20

## 2016-12-31 MED ORDER — ONDANSETRON HCL 4 MG PO TABS
4.0000 mg | ORAL_TABLET | Freq: Four times a day (QID) | ORAL | Status: DC | PRN
  Administered 2017-01-01: 4 mg via ORAL
  Filled 2016-12-31: qty 1

## 2016-12-31 MED ORDER — ACETAMINOPHEN 650 MG RE SUPP: 650.0000 mg | RECTAL | Status: DC | PRN

## 2016-12-31 MED ORDER — FENTANYL CITRATE (PF) 100 MCG/2ML IJ SOLN
INTRAMUSCULAR | Status: DC | PRN
Start: 2016-12-31 — End: 2016-12-31
  Administered 2016-12-31: 50 ug via INTRAVENOUS
  Administered 2016-12-31: 25 ug via INTRAVENOUS
  Administered 2016-12-31: 50 ug via INTRAVENOUS
  Administered 2016-12-31: 100 ug via INTRAVENOUS
  Administered 2016-12-31: 25 ug via INTRAVENOUS

## 2016-12-31 MED ORDER — PROPOFOL 10 MG/ML IV BOLUS
INTRAVENOUS | Status: DC | PRN
  Administered 2016-12-31: 200 mg via INTRAVENOUS

## 2016-12-31 MED ORDER — BACITRACIN ZINC 500 UNIT/GM EX OINT
TOPICAL_OINTMENT | CUTANEOUS | Status: DC | PRN
  Administered 2016-12-31: 1 via TOPICAL

## 2016-12-31 MED ORDER — LIDOCAINE HCL (CARDIAC) 20 MG/ML IV SOLN
INTRAVENOUS | Status: DC | PRN
  Administered 2016-12-31: 25 mg via INTRAVENOUS
  Administered 2016-12-31: 80 mg via INTRAVENOUS

## 2016-12-31 MED ORDER — LACTATED RINGERS IV SOLN
INTRAVENOUS | Status: DC
  Administered 2016-12-31 (×2): via INTRAVENOUS

## 2016-12-31 MED ORDER — HYDROMORPHONE HCL 1 MG/ML IJ SOLN
INTRAMUSCULAR | Status: AC
  Administered 2016-12-31: 0.5 mg via INTRAVENOUS
  Filled 2016-12-31: qty 1

## 2016-12-31 MED ORDER — AMPHETAMINE-DEXTROAMPHETAMINE 10 MG PO TABS: 10.0000 mg | ORAL_TABLET | Freq: Two times a day (BID) | ORAL | Status: DC

## 2016-12-31 MED ORDER — HYDROMORPHONE HCL 1 MG/ML IJ SOLN
INTRAMUSCULAR | Status: DC | PRN
  Administered 2016-12-31: 0.5 mg via INTRAVENOUS

## 2016-12-31 MED ORDER — PROPOFOL 500 MG/50ML IV EMUL
INTRAVENOUS | Status: DC | PRN
  Administered 2016-12-31: 100 ug/kg/min via INTRAVENOUS

## 2016-12-31 MED ORDER — HYDROMORPHONE HCL 1 MG/ML IJ SOLN
INTRAMUSCULAR | Status: AC
  Filled 2016-12-31: qty 0.5

## 2016-12-31 MED ORDER — ROCURONIUM BROMIDE 100 MG/10ML IV SOLN
INTRAVENOUS | Status: DC | PRN
  Administered 2016-12-31: 5 mg via INTRAVENOUS
  Administered 2016-12-31: 60 mg via INTRAVENOUS

## 2016-12-31 MED ORDER — DEXAMETHASONE SODIUM PHOSPHATE 10 MG/ML IJ SOLN
INTRAMUSCULAR | Status: AC
  Filled 2016-12-31: qty 1

## 2016-12-31 MED ORDER — PROMETHAZINE HCL 25 MG/ML IJ SOLN
6.2500 mg | INTRAMUSCULAR | Status: AC | PRN
  Administered 2016-12-31 (×2): 6.25 mg via INTRAVENOUS

## 2016-12-31 MED ORDER — CALCIUM CARBONATE ANTACID 1000 MG PO CHEW: 1000.0000 mg | CHEWABLE_TABLET | Freq: Every day | ORAL | Status: DC

## 2016-12-31 MED ORDER — CHLORHEXIDINE GLUCONATE CLOTH 2 % EX PADS: 6.0000 | MEDICATED_PAD | Freq: Once | CUTANEOUS | Status: DC

## 2016-12-31 MED ORDER — SODIUM CHLORIDE 0.9% FLUSH: 3.0000 mL | INTRAVENOUS | Status: DC | PRN

## 2016-12-31 MED ORDER — THROMBIN (RECOMBINANT) 5000 UNITS EX SOLR
CUTANEOUS | Status: DC | PRN
  Administered 2016-12-31 (×2): 5000 [IU] via TOPICAL

## 2016-12-31 MED ORDER — HYDROCODONE-ACETAMINOPHEN 5-325 MG PO TABS: 1.0000 | ORAL_TABLET | ORAL | Status: DC | PRN

## 2016-12-31 MED ORDER — BUPIVACAINE HCL (PF) 0.25 % IJ SOLN
INTRAMUSCULAR | Status: DC | PRN
  Administered 2016-12-31: 20 mL

## 2016-12-31 MED ORDER — SODIUM CHLORIDE 0.9% FLUSH
3.0000 mL | Freq: Two times a day (BID) | INTRAVENOUS | Status: DC
  Administered 2016-12-31 (×2): 3 mL via INTRAVENOUS

## 2016-12-31 MED ORDER — SUGAMMADEX SODIUM 500 MG/5ML IV SOLN
INTRAVENOUS | Status: AC
  Filled 2016-12-31: qty 5

## 2016-12-31 MED ORDER — HYDROMORPHONE HCL 1 MG/ML IJ SOLN
1.0000 mg | INTRAMUSCULAR | Status: DC | PRN
  Administered 2017-01-01: 1 mg via INTRAVENOUS
  Filled 2016-12-31: qty 1

## 2016-12-31 MED ORDER — CALCIUM CARBONATE ANTACID 500 MG PO CHEW
2.0000 | CHEWABLE_TABLET | Freq: Every day | ORAL | Status: DC
  Administered 2016-12-31 – 2017-01-01 (×2): 400 mg via ORAL
  Filled 2016-12-31 (×2): qty 2

## 2016-12-31 MED ORDER — VITAMIN D 1000 UNITS PO TABS
2000.0000 [IU] | ORAL_TABLET | Freq: Every day | ORAL | Status: DC
  Administered 2017-01-01: 2000 [IU] via ORAL
  Filled 2016-12-31: qty 2

## 2016-12-31 MED ORDER — ESMOLOL HCL 100 MG/10ML IV SOLN
INTRAVENOUS | Status: DC | PRN
  Administered 2016-12-31: 20 mg via INTRAVENOUS
  Administered 2016-12-31: 40 mg via INTRAVENOUS
  Administered 2016-12-31 (×4): 20 mg via INTRAVENOUS

## 2016-12-31 MED ORDER — VANCOMYCIN HCL 1000 MG IV SOLR
INTRAVENOUS | Status: AC
  Filled 2016-12-31: qty 1000

## 2016-12-31 MED ORDER — SODIUM CHLORIDE 0.9 % IV SOLN
INTRAVENOUS | Status: DC | PRN
  Administered 2016-12-31: 1000 mg via INTRAVENOUS

## 2016-12-31 MED ORDER — VANCOMYCIN HCL IN DEXTROSE 1-5 GM/200ML-% IV SOLN
1000.0000 mg | Freq: Once | INTRAVENOUS | Status: AC
  Administered 2016-12-31: 1000 mg via INTRAVENOUS
  Filled 2016-12-31: qty 200

## 2016-12-31 MED ORDER — FENTANYL CITRATE (PF) 250 MCG/5ML IJ SOLN
INTRAMUSCULAR | Status: AC
  Filled 2016-12-31: qty 5

## 2016-12-31 MED ORDER — HYDROCODONE-ACETAMINOPHEN 10-325 MG PO TABS
2.0000 | ORAL_TABLET | ORAL | Status: DC | PRN
  Administered 2016-12-31 – 2017-01-01 (×6): 2 via ORAL
  Filled 2016-12-31 (×6): qty 2

## 2016-12-31 MED ORDER — MIRTAZAPINE 30 MG PO TABS
30.0000 mg | ORAL_TABLET | Freq: Every day | ORAL | Status: DC
  Administered 2016-12-31: 30 mg via ORAL
  Filled 2016-12-31 (×2): qty 1

## 2016-12-31 MED ORDER — BIOTIN 5000 MCG PO TABS: 5000.0000 ug | ORAL_TABLET | ORAL | Status: DC

## 2016-12-31 MED ORDER — INSULIN ASPART 100 UNIT/ML ~~LOC~~ SOLN
0.0000 [IU] | Freq: Three times a day (TID) | SUBCUTANEOUS | Status: DC
  Administered 2017-01-01: 7 [IU] via SUBCUTANEOUS

## 2016-12-31 MED ORDER — ONDANSETRON HCL 4 MG/2ML IJ SOLN
INTRAMUSCULAR | Status: DC | PRN
  Administered 2016-12-31: 4 mg via INTRAVENOUS

## 2016-12-31 MED ORDER — CYCLOBENZAPRINE HCL 10 MG PO TABS
10.0000 mg | ORAL_TABLET | Freq: Three times a day (TID) | ORAL | Status: DC | PRN
  Administered 2016-12-31 – 2017-01-01 (×3): 10 mg via ORAL
  Filled 2016-12-31 (×3): qty 1

## 2016-12-31 MED ORDER — ACETAMINOPHEN 325 MG PO TABS: 650.0000 mg | ORAL_TABLET | ORAL | Status: DC | PRN

## 2016-12-31 MED ORDER — PHENYLEPHRINE 40 MCG/ML (10ML) SYRINGE FOR IV PUSH (FOR BLOOD PRESSURE SUPPORT)
PREFILLED_SYRINGE | INTRAVENOUS | Status: AC
  Filled 2016-12-31: qty 10

## 2016-12-31 MED ORDER — ONDANSETRON HCL 4 MG/2ML IJ SOLN
INTRAMUSCULAR | Status: AC
  Filled 2016-12-31: qty 2

## 2016-12-31 MED ORDER — MENTHOL 3 MG MT LOZG
1.0000 | LOZENGE | OROMUCOSAL | Status: DC | PRN
  Filled 2016-12-31: qty 9

## 2016-12-31 MED ORDER — INSULIN ASPART 100 UNIT/ML ~~LOC~~ SOLN: 0.0000 [IU] | Freq: Three times a day (TID) | SUBCUTANEOUS | Status: DC

## 2016-12-31 MED ORDER — HYDROMORPHONE HCL 1 MG/ML IJ SOLN
0.2500 mg | INTRAMUSCULAR | Status: DC | PRN
  Administered 2016-12-31 (×3): 0.5 mg via INTRAVENOUS

## 2016-12-31 MED ORDER — LORAZEPAM 0.5 MG PO TABS
2.0000 mg | ORAL_TABLET | Freq: Three times a day (TID) | ORAL | Status: DC | PRN
  Administered 2016-12-31: 2 mg via ORAL
  Filled 2016-12-31: qty 4

## 2016-12-31 MED ORDER — ATORVASTATIN CALCIUM 20 MG PO TABS
40.0000 mg | ORAL_TABLET | Freq: Every day | ORAL | Status: DC
  Administered 2016-12-31: 40 mg via ORAL
  Filled 2016-12-31: qty 2

## 2016-12-31 MED ORDER — INSULIN ASPART 100 UNIT/ML ~~LOC~~ SOLN
18.0000 [IU] | Freq: Three times a day (TID) | SUBCUTANEOUS | Status: DC
  Administered 2016-12-31 – 2017-01-01 (×5): 18 [IU] via SUBCUTANEOUS

## 2016-12-31 MED ORDER — HEMOSTATIC AGENTS (NO CHARGE) OPTIME
TOPICAL | Status: DC | PRN
  Administered 2016-12-31: 1 via TOPICAL

## 2016-12-31 MED ORDER — LAMOTRIGINE 100 MG PO TABS
100.0000 mg | ORAL_TABLET | Freq: Two times a day (BID) | ORAL | Status: DC
  Administered 2016-12-31 – 2017-01-01 (×2): 100 mg via ORAL
  Filled 2016-12-31 (×3): qty 1

## 2016-12-31 MED ORDER — MIDAZOLAM HCL 5 MG/5ML IJ SOLN
INTRAMUSCULAR | Status: DC | PRN
  Administered 2016-12-31: 2 mg via INTRAVENOUS

## 2016-12-31 MED ORDER — DEXAMETHASONE SODIUM PHOSPHATE 10 MG/ML IJ SOLN
10.0000 mg | INTRAMUSCULAR | Status: AC
  Administered 2016-12-31: 10 mg via INTRAVENOUS

## 2016-12-31 MED ORDER — CHOLECALCIFEROL 50 MCG (2000 UT) PO CAPS: 1.0000 | ORAL_CAPSULE | Freq: Every day | ORAL | Status: DC

## 2016-12-31 MED ORDER — PROPOFOL 10 MG/ML IV BOLUS
INTRAVENOUS | Status: AC
  Filled 2016-12-31: qty 20

## 2016-12-31 MED ORDER — KETAMINE HCL 10 MG/ML IJ SOLN
INTRAMUSCULAR | Status: DC | PRN
  Administered 2016-12-31: 10 mg via INTRAVENOUS
  Administered 2016-12-31: 20 mg via INTRAVENOUS
  Administered 2016-12-31: 40 mg via INTRAVENOUS
  Administered 2016-12-31: 10 mg via INTRAVENOUS
  Administered 2016-12-31: 20 mg via INTRAVENOUS

## 2016-12-31 MED ORDER — DULOXETINE HCL 30 MG PO CPEP
30.0000 mg | ORAL_CAPSULE | Freq: Two times a day (BID) | ORAL | Status: DC
  Administered 2016-12-31 – 2017-01-01 (×2): 30 mg via ORAL
  Filled 2016-12-31 (×2): qty 1

## 2016-12-31 MED ORDER — VANCOMYCIN HCL IN DEXTROSE 1-5 GM/200ML-% IV SOLN
INTRAVENOUS | Status: AC
  Filled 2016-12-31: qty 200

## 2016-12-31 MED ORDER — SCOPOLAMINE 1 MG/3DAYS TD PT72
1.0000 | MEDICATED_PATCH | TRANSDERMAL | Status: DC
  Administered 2016-12-31: 1.5 mg via TRANSDERMAL
  Filled 2016-12-31: qty 1

## 2016-12-31 MED ORDER — SUGAMMADEX SODIUM 200 MG/2ML IV SOLN
INTRAVENOUS | Status: DC | PRN
Start: 2016-12-31 — End: 2016-12-31
  Administered 2016-12-31: 200 mg via INTRAVENOUS

## 2016-12-31 MED ORDER — KETAMINE HCL-SODIUM CHLORIDE 100-0.9 MG/10ML-% IV SOSY
PREFILLED_SYRINGE | INTRAVENOUS | Status: AC
  Filled 2016-12-31: qty 10

## 2016-12-31 MED ORDER — GABAPENTIN 400 MG PO CAPS
1200.0000 mg | ORAL_CAPSULE | Freq: Every day | ORAL | Status: DC
  Administered 2016-12-31: 1200 mg via ORAL
  Filled 2016-12-31: qty 3

## 2016-12-31 MED ORDER — INSULIN ASPART 100 UNIT/ML ~~LOC~~ SOLN
0.0000 [IU] | Freq: Every day | SUBCUTANEOUS | Status: DC
  Administered 2016-12-31: 5 [IU] via SUBCUTANEOUS

## 2016-12-31 MED ORDER — INSULIN DETEMIR 100 UNIT/ML ~~LOC~~ SOLN
78.0000 [IU] | Freq: Every day | SUBCUTANEOUS | Status: DC
  Administered 2016-12-31: 78 [IU] via SUBCUTANEOUS
  Filled 2016-12-31 (×2): qty 0.78

## 2016-12-31 MED ORDER — VANCOMYCIN HCL 1000 MG IV SOLR
INTRAVENOUS | Status: DC | PRN
  Administered 2016-12-31: 1000 mg

## 2016-12-31 MED ORDER — PHENOL 1.4 % MT LIQD
1.0000 | OROMUCOSAL | Status: DC | PRN
  Filled 2016-12-31: qty 177

## 2016-12-31 MED ORDER — ONDANSETRON HCL 4 MG/2ML IJ SOLN: 4.0000 mg | Freq: Four times a day (QID) | INTRAMUSCULAR | Status: DC | PRN

## 2016-12-31 SURGICAL SUPPLY — 60 items
ADH SKN CLS APL DERMABOND .7 (GAUZE/BANDAGES/DRESSINGS) ×1
APL SKNCLS STERI-STRIP NONHPOA (GAUZE/BANDAGES/DRESSINGS) ×1
BAG DECANTER FOR FLEXI CONT (MISCELLANEOUS) ×2 IMPLANT
BENZOIN TINCTURE PRP APPL 2/3 (GAUZE/BANDAGES/DRESSINGS) ×2 IMPLANT
BIT DRILL 2.4X (BIT) IMPLANT
BIT DRL 2.4X (BIT) ×1
BLADE CLIPPER SURG (BLADE) IMPLANT
BUR MATCHSTICK NEURO 3.0 LAGG (BURR) ×2 IMPLANT
CANISTER SUCT 3000ML PPV (MISCELLANEOUS) ×2 IMPLANT
CARTRIDGE OIL MAESTRO DRILL (MISCELLANEOUS) ×1 IMPLANT
DERMABOND ADVANCED (GAUZE/BANDAGES/DRESSINGS) ×1
DERMABOND ADVANCED .7 DNX12 (GAUZE/BANDAGES/DRESSINGS) ×1 IMPLANT
DIFFUSER DRILL AIR PNEUMATIC (MISCELLANEOUS) ×2 IMPLANT
DRAPE C-ARM 42X72 X-RAY (DRAPES) ×4 IMPLANT
DRAPE LAPAROTOMY 100X72 PEDS (DRAPES) ×2 IMPLANT
DRAPE POUCH INSTRU U-SHP 10X18 (DRAPES) ×2 IMPLANT
DRILL BIT (BIT) ×2
DRSG OPSITE POSTOP 3X4 (GAUZE/BANDAGES/DRESSINGS) ×1 IMPLANT
DURAPREP 26ML APPLICATOR (WOUND CARE) ×2 IMPLANT
ELECT REM PT RETURN 9FT ADLT (ELECTROSURGICAL) ×2
ELECTRODE REM PT RTRN 9FT ADLT (ELECTROSURGICAL) ×1 IMPLANT
EVACUATOR 1/8 PVC DRAIN (DRAIN) IMPLANT
GAUZE SPONGE 4X4 12PLY STRL (GAUZE/BANDAGES/DRESSINGS) ×2 IMPLANT
GAUZE SPONGE 4X4 16PLY XRAY LF (GAUZE/BANDAGES/DRESSINGS) IMPLANT
GLOVE ECLIPSE 9.0 STRL (GLOVE) ×2 IMPLANT
GLOVE EXAM NITRILE LRG STRL (GLOVE) IMPLANT
GLOVE EXAM NITRILE XL STR (GLOVE) IMPLANT
GLOVE EXAM NITRILE XS STR PU (GLOVE) IMPLANT
GOWN STRL REUS W/ TWL LRG LVL3 (GOWN DISPOSABLE) IMPLANT
GOWN STRL REUS W/ TWL XL LVL3 (GOWN DISPOSABLE) ×1 IMPLANT
GOWN STRL REUS W/TWL 2XL LVL3 (GOWN DISPOSABLE) IMPLANT
GOWN STRL REUS W/TWL LRG LVL3 (GOWN DISPOSABLE)
GOWN STRL REUS W/TWL XL LVL3 (GOWN DISPOSABLE) ×2
KIT BASIN OR (CUSTOM PROCEDURE TRAY) ×2 IMPLANT
KIT INFUSE X SMALL 1.4CC (Orthopedic Implant) ×1 IMPLANT
KIT ROOM TURNOVER OR (KITS) ×2 IMPLANT
NDL SPNL 22GX3.5 QUINCKE BK (NEEDLE) ×1 IMPLANT
NEEDLE HYPO 22GX1.5 SAFETY (NEEDLE) ×2 IMPLANT
NEEDLE SPNL 22GX3.5 QUINCKE BK (NEEDLE) ×2 IMPLANT
NS IRRIG 1000ML POUR BTL (IV SOLUTION) ×2 IMPLANT
OIL CARTRIDGE MAESTRO DRILL (MISCELLANEOUS) ×2
PACK LAMINECTOMY NEURO (CUSTOM PROCEDURE TRAY) ×2 IMPLANT
PAD ARMBOARD 7.5X6 YLW CONV (MISCELLANEOUS) ×6 IMPLANT
PIN MAYFIELD SKULL DISP (PIN) ×2 IMPLANT
PUTTY DBM GRAFTON 5CC (Putty) ×1 IMPLANT
ROD VERTEX SPINAL (Rod) ×4 IMPLANT
ROD VERTEX SPN 30X3.5XPCUT NS (Rod) IMPLANT
SCREW 3.5X12 (Screw) ×12 IMPLANT
SCREW BN 12X3.5XMA SPNE (Screw) IMPLANT
SCREW SET M6 (Screw) ×6 IMPLANT
SPONGE LAP 4X18 X RAY DECT (DISPOSABLE) IMPLANT
SPONGE SURGIFOAM ABS GEL SZ50 (HEMOSTASIS) ×2 IMPLANT
STRIP CLOSURE SKIN 1/2X4 (GAUZE/BANDAGES/DRESSINGS) ×2 IMPLANT
SUT VIC AB 0 CT1 18XCR BRD8 (SUTURE) ×1 IMPLANT
SUT VIC AB 0 CT1 8-18 (SUTURE) ×2
SUT VIC AB 2-0 CT1 18 (SUTURE) ×2 IMPLANT
SUT VIC AB 3-0 SH 8-18 (SUTURE) ×2 IMPLANT
TOWEL GREEN STERILE (TOWEL DISPOSABLE) ×2 IMPLANT
TOWEL GREEN STERILE FF (TOWEL DISPOSABLE) ×2 IMPLANT
WATER STERILE IRR 1000ML POUR (IV SOLUTION) ×2 IMPLANT

## 2016-12-31 NOTE — Plan of Care (Signed)
  Progressing Activity: Ability to avoid complications of mobility impairment will improve 12/31/2016 2219 - Progressing by Charlena Cross, RN Ability to tolerate increased activity will improve 12/31/2016 2219 - Progressing by Charlena Cross, RN Will remain free from falls 12/31/2016 2219 - Progressing by Charlena Cross, RN Education: Ability to verbalize activity precautions or restrictions will improve 12/31/2016 2219 - Progressing by Charlena Cross, RN Knowledge of the prescribed therapeutic regimen will improve 12/31/2016 2219 - Progressing by Charlena Cross, RN Understanding of discharge needs will improve 12/31/2016 2219 - Progressing by Charlena Cross, RN Physical Regulation: Ability to maintain clinical measurements within normal limits will improve 12/31/2016 2219 - Progressing by Charlena Cross, RN Postoperative complications will be avoided or minimized 12/31/2016 2219 - Progressing by Charlena Cross, RN Diagnostic test results will improve 12/31/2016 2219 - Progressing by Charlena Cross, RN Pain Management: Pain level will decrease 12/31/2016 2219 - Progressing by Charlena Cross, RN Health Behavior/Discharge Planning: Identification of resources available to assist in meeting health care needs will improve 12/31/2016 2219 - Progressing by Charlena Cross, RN

## 2016-12-31 NOTE — H&P (Signed)
Diana [name] is an 61 y.o. female.   Chief Complaint: NECK PAIN HPI: 61 year old female status post previous C5-6 anterior cervical discectomy and fusion by another physician. Workup demonstrates evidence of significant nonunion with some anterior screw migration at C6. Patient with chronic and progressive neck and bilateral upper chamois symptoms. Workup also demonstrates evidence of a broad-based disc bulge with early stenosis at C4-5. Patient presents now for C4-5 and C5-6 posterior cervical fusion with instrumentation in hopes of improving her symptoms.  Past Medical History:  Diagnosis Date  . Abnormal mammogram 12/2014   F/u diagnostic mammo and u/s NORMAL--resume annual screening mammography  . Arthritis   . Bipolar disorder ([place])    questionable  . Cervical radiculopathy    cervical spondylosis on 2013 MRI.  Dr. 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  Marland Kitchen Chronic fatigue   . Chronic nausea 2012/13   Normal upper GI  12/2011 (mild GERD noted +small hiatal hernia)+  . Chronic pain syndrome    Chronic C spine pain with radiculopathy.  Dr. Francesco Runner at [facility] was her pain mgmt MD and he discharged her from [facility] pain mgmt center b/c she failed a UDS (+marijuana) 04/27/15  . Chronic renal insufficiency, stage II (mild) 02/2015   GFR 70s  . Colon cancer screening 03/06/2011   iFob 11/2010 through her insurer was negative. iFOB 01/2015 NEG.  . Depression   . Diabetes mellitus    urine protein-Cr ratio nl 09/2010, D.R. Screen neg 10/2009  . Fibromyalgia   . Fracture of rib of left side 08/14/15   5th and 6th  . Hepatic steatosis 2005   ultrasound  . History of kidney stones   . Hyperlipidemia, mixed 2017   Waiting to start statin (trying to take one thing at a time with her, working on glucose control first)  . Insomnia   . Iron deficiency anemia 09/2009   malabsorbtion s/p bariatric surgery  . Nephrolithiasis 11/2012  . Neurosis, anxiety, panic type   . Obesity    s/p bariatric surgery  . Osteopenia 12/2010   Hip T score -2.0.  Spine T score -0.5 (plan to repeat in 2 yrs)  . Other B-complex deficiencies   . Panic attacks   . PONV (postoperative nausea and vomiting)   . Restless legs syndrome   . Syncopal episodes    7/73/17 in the setting of dehydration and sedative medication  . Tobacco dependence   . Vitamin D deficiency 09/2009  . Wears dentures    uppers and lowers  . Wears glasses     Past Surgical History:  Procedure Laterality Date  . ABDOMINAL HYSTERECTOMY     fibroids, ovaries remain  . APPENDECTOMY    . BACK SURGERY    . BLADDER SURGERY     bladder tack  . CERVICAL SPINE SURGERY  09/2009; 2014   2014-ant cerv decomp (Dr. Carloyn Manner)  . CHOLECYSTECTOMY  04/2010   w/lysis of adhesions (open procedure)--path showed chronic cholecystitis and cholesterol polyp.  . CYSTOSCOPY/RETROGRADE/URETEROSCOPY/STONE EXTRACTION WITH BASKET  11/2012   Left UVJ stone  . CYSTOSCOPY/RETROGRADE/URETEROSCOPY/STONE EXTRACTION WITH BASKET insertion double j stent Left 11/21/2012   Performed by Marcia Brash, MD at [facility]S  . EPIDURAL BLOCK INJECTION  03/2011   Cervical  . HERNIA REPAIR  2005   mesenteric hernia repair, with lysis of adhesions (presented with SBO)  .  ROUX-EN-Y GASTRIC BYPASS    . TONSILLECTOMY    . TONSILLECTOMY      Family History  Problem Relation Age of Onset  . Heart disease Mother   . Cancer Mother   . Heart disease Father   . Cancer Sister   . Heart disease Sister   . Alzheimer's disease Brother   . Kidney disease Brother    Social History:  reports that she quit smoking about 8 months ago. Her smoking use included cigarettes. She has a 1.00 pack-year smoking history. she has never used smokeless tobacco. She reports that she does not drink alcohol or use drugs.  Allergies:  Allergies  Allergen Reactions  .  Aspirin Other (See Comments)    Gastric Bypass  . Latex Other (See Comments)    blisters  . Penicillins     UNSPECIFIED REACTION >"Took large quantities as a child & told to never take again Has patient had a PCN reaction causing immediate rash, facial/tongue/throat swelling, SOB or lightheadedness with hypotension: No Has patient had a PCN reaction causing severe rash involving mucus membranes or skin necrosis: No Has patient had a PCN reaction that required hospitalization: No Has patient had a PCN reaction occurring within the last 10 years: No If all of the above answers are "NO", then may proceed wi  . Codeine Itching    Medications Prior to Admission  Medication Sig Dispense Refill  . amphetamine-dextroamphetamine (ADDERALL) 10 MG tablet 1 tab po bid (Patient taking differently: Take 10 mg 2 (two) times daily with a meal by mouth. ) 60 tablet 0  . atorvastatin (LIPITOR) 40 MG tablet TAKE (1) TABLET BY MOUTH AT BEDTIME. (Patient taking differently: TAKE 40 MG BY MOUTH AT BEDTIME.) 90 tablet 3  . Biotin 5000 MCG TABS Take 5,000 mcg every other day by mouth.     . calcium elemental as carbonate (TUMS ULTRA 1000) 400 MG chewable tablet Chew 1,000 mg by mouth daily.      . Cholecalciferol 2000 UNITS CAPS Take 1 capsule (2,000 Units total) by mouth daily. 30 each 11  . DULoxetine (CYMBALTA) 30 MG capsule TAKE (1) CAPSULE BY MOUTH TWICE DAILY. (Patient taking differently: TAKE 30 MG BY MOUTH TWICE DAILY.) 60 capsule 5  . gabapentin (NEURONTIN) 300 MG capsule TAKE 4 CAPSULES BY MOUTH AT BEDTIME. (Patient taking differently: TAKE 1200 MG BY MOUTH AT BEDTIME.) 360 capsule 3  . insulin aspart (NOVOLOG) 100 UNIT/ML injection 18 U SQ qAC--pt will be titrating med up gradually (Patient taking differently: Inject 18 Units 3 (three) times daily with meals into the skin. ) 30 mL 5  . insulin detemir (LEVEMIR) 100 UNIT/ML injection 70 U SQ qhs--pt will be titrating dose up gradually (Patient taking  differently: Inject 78 Units at bedtime into the skin. ) 30 mL 5  . lamoTRIgine (LAMICTAL) 100 MG tablet TAKE 1 TABLET BY MOUTH TWICE DAILY. (Patient taking differently: TAKE 100 MG BY MOUTH TWICE DAILY.) 60 tablet 6  . LORazepam (ATIVAN) 2 MG tablet TAKE (1) TABLET BY MOUTH EVERY EIGHT HOURS AS NEEDED. (Patient taking differently: Take 2 mg every 8 (eight) hours as needed by mouth for anxiety. ) 90 tablet 2  . mirtazapine (REMERON) 30 MG tablet TAKE (1) TABLET BY MOUTH AT BEDTIME. (Patient taking differently: TAKE 30 MG BY MOUTH AT BEDTIME.) 90 tablet 3  . B-D UF III MINI PEN NEEDLES 31G X 5 MM MISC USE AS DIRECTED UP TO 4 TIMES DAILY. (Patient not taking: Reported on  12/24/2016) 100 each 11  . blood glucose meter kit and supplies Use up to four times daily as directed. (Patient not taking: Reported on 12/24/2016) 1 each 0  . conjugated estrogens (PREMARIN) vaginal cream Place 1 Applicatorful vaginally daily. (Patient not taking: Reported on 12/03/2016) 30 g 12  . glucose blood (ACCU-CHEK AVIVA PLUS) test strip Use to check glucose 4 times per day (Patient not taking: Reported on 12/24/2016) 100 each 12  . HYDROcodone-acetaminophen (NORCO/VICODIN) 5-325 MG tablet Take 1-2 tablets by mouth every 6 (six) hours as needed for moderate pain. (Patient not taking: Reported on 12/24/2016) 20 tablet 0  . Insulin Pen Needle 31G X 6 MM MISC Use as directed (Patient not taking: Reported on 12/24/2016) 100 each 6    Results for orders placed or performed during the hospital encounter of 12/31/16 (from the past 48 hour(s))  Glucose, capillary     Status: Abnormal   Collection Time: 12/31/16  8:57 AM  Result Value Ref Range   Glucose-Capillary 210 (H) 65 - 99 mg/dL   Comment 1 Notify RN    No results found.  Pertinent items noted in HPI and remainder of comprehensive ROS otherwise negative.  Blood pressure 132/70, pulse 97, temperature 98.3 F (36.8 C), temperature source Oral, resp. rate 20, height 5'  5.5" (1.664 m), weight 83.5 kg (184 lb), SpO2 96 %.  Patient is awake and alert. She is oriented and appropriate. Speech is fluent. Judgment and insight are intact. Cranial nerve function normal bilaterally. Motor examination 5/5 bilaterally. Sensory examination nonfocal. Deep tendon versus a normal active. No evidence of long track signs. Gait and posture normal. Examination head ears eyes and throat are marked. Chest and abdomen are benign. Extremities are free from injury deformity. 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 Plan C4-5-6 posterior cervical fusion with lateral mass instrumentation, locally harvested autograft, and bone morphogenic protein. Risks and benefits of been explained. Patient wishes to proceed.  Mallie Mussel A Randee Huston 12/31/2016, 10:38 AM

## 2016-12-31 NOTE — Progress Notes (Signed)
Orthopedic Tech Progress Note Patient Details:  Diana [name] 1955-08-31 [id number]  Ortho Devices Type of Ortho Device: Soft collar Ortho Device/Splint Interventions: Application   [name] 12/31/2016, 1:29 PM

## 2016-12-31 NOTE — Op Note (Signed)
Date of procedure: 12/31/2016  Date of dictation: Same  Service: Neurosurgery  Preoperative diagnosis: C4-5 disc bulge with early stenosis, C5-6 symptomatic pseudoarthrosis  Postoperative diagnosis: Same   Procedure Name:C4-5-6 posterior cervical  Fusion with lateral mass instrumentation utilizing morselized autograft, allograft, and bone morphogenic protein  Surgeon:Jametta Moorehead A.Dunia Pringle, M.D.  Asst. Surgeon: Ditty  Anesthesia: General  Indication: 61 year old female status post C5-6 anterior cervical fusion by another physician with evidence of pseudoarthrosis.  Patient also with early stenosis at C4-5 and some hypermobility at this level.  Patient presents now for 2 level posterior cervical fusion  Operative note: After induction of anesthesia, patient positioned prone onto bolsters with the head fixed in the [place] pin headrest in a slightly flexed position.  Patient's posterior cervical region was prepped and draped sterilely.  Incision made from C4-C6.  Dissection performed bilaterally.  Retractor placed.  Fluoroscopy used.  Levels confirmed.  Entry sites into the lateral mass of C4-C5 and C6 were determined approximately at the point which is 1 mm inferior and 1 mm medial to the midpoint of the articular mass.  Pilot holes were drilled.  Pilot holes were probed and found to be solid within bone.  A 12 mm Medtronic vertex screws were passed into the lateral masses of C4-C5 and C6 with good purchase.  Short segment had titanium rod was placed over the screw heads.  Locking caps were placed over the screws.  Locking caps were then engaged.  Final images reveal good position of the screws at the proper operative level with normal alignment of the spine.  Lamina and facet joint complexes were decorticated.  Morcellized autograft resected was packed posterior laterally as well as graft on putty as well as bone morphogenic protein.  Wound is irrigated one final time.  Vancomycin powder was placed in  deep wound space.  Wound was then closed in layers with Vicryl sutures.  Steri-Strips and sterile dressing were applied.  No apparent complications.  Patient tolerated the procedure well and she returns to the recovery room postop.

## 2016-12-31 NOTE — Anesthesia Procedure Notes (Signed)
Procedure Name: Intubation Date/Time: 12/31/2016 11:13 AM Performed by: Duane Boston, MD Pre-anesthesia Checklist: Patient identified, Emergency Drugs available, Suction available, Patient being monitored and Timeout performed Patient Re-evaluated:Patient Re-evaluated prior to induction Oxygen Delivery Method: Circle system utilized Preoxygenation: Pre-oxygenation with 100% oxygen Induction Type: IV induction Ventilation: Oral airway inserted - appropriate to patient size and Mask ventilation without difficulty Laryngoscope Size: Mac and 3 Grade View: Grade II Tube type: Oral Tube size: 7.5 mm Number of attempts: 1 Airway Equipment and Method: Stylet Placement Confirmation: ETT inserted through vocal cords under direct vision Secured at: 22 cm Tube secured with: Tape Dental Injury: Teeth and Oropharynx as per pre-operative assessment  Comments: Performed by H. Rithvik Orcutt SRNA

## 2016-12-31 NOTE — Anesthesia Postprocedure Evaluation (Signed)
Anesthesia Post Note  Patient: Diana [name]  Procedure(s) Performed: Posterior Cervical Fusion with lateral mass fixation - Cervical four-five, Cervical five-six (N/A Neck)     Patient location during evaluation: PACU Anesthesia Type: General Level of consciousness: sedated Pain management: pain level controlled Vital Signs Assessment: post-procedure vital signs reviewed and stable Respiratory status: spontaneous breathing and respiratory function stable Cardiovascular status: stable Postop Assessment: adequate PO intake Anesthetic complications: no    Last Vitals:  Vitals:   12/31/16 1400 12/31/16 1415  BP: (!) 157/89   Pulse: (!) 101 (!) 107  Resp: 15 18  Temp:  36.6 C  SpO2: 98% 95%    Last Pain:  Vitals:   12/31/16 1415  TempSrc:   PainSc: Asleep                 Kemaria Dedic DANIEL

## 2016-12-31 NOTE — Progress Notes (Signed)
PHARMACIST - PHYSICIAN ORDER COMMUNICATION  CONCERNING: P&T Medication Policy on Herbal Medications  DESCRIPTION:  This patient's order for: biotin  has been noted.  This product(s) is classified as an "herbal" or natural product. Due to a lack of definitive safety studies or FDA approval, nonstandard manufacturing practices, plus the potential risk of unknown drug-drug interactions while on inpatient medications, the Pharmacy and Therapeutics Committee does not permit the use of "herbal" or natural products of this type within [facility].   ACTION TAKEN: The pharmacy department is unable to verify this order at this time and your patient has been informed of this safety policy. Please reevaluate patient's clinical condition at discharge and address if the herbal or natural product(s) should be resumed at that time.  Doylene Canard, PharmD Clinical Pharmacist

## 2016-12-31 NOTE — Transfer of Care (Signed)
Immediate Anesthesia Transfer of Care Note  Patient: Diana [name]  Procedure(s) Performed: Posterior Cervical Fusion with lateral mass fixation - Cervical four-five, Cervical five-six (N/A Neck)  Patient Location: PACU  Anesthesia Type:General  Level of Consciousness: awake, patient cooperative and responds to stimulation  Airway & Oxygen Therapy: Patient Spontanous Breathing and Patient connected to nasal cannula oxygen  Post-op Assessment: Report given to RN, Post -op Vital signs reviewed and stable and Patient moving all extremities X 4  Post vital signs: Reviewed and stable  Last Vitals:  Vitals:   12/31/16 0858  BP: 132/70  Pulse: 97  Resp: 20  Temp: 36.8 C  SpO2: 96%    Last Pain:  Vitals:   12/31/16 0902  TempSrc:   PainSc: 7       Patients Stated Pain Goal: 3 (36/46/80 3212)  Complications: No apparent anesthesia complications

## 2016-12-31 NOTE — Brief Op Note (Signed)
12/31/2016  12:48 PM  PATIENT:  Diana [name]  61 y.o. female  PRE-OPERATIVE DIAGNOSIS:  Pseudoarthrosis  POST-OPERATIVE DIAGNOSIS:  Pseudoarthrosis  PROCEDURE:  Procedure(s): Posterior Cervical Fusion with lateral mass fixation - Cervical four-five, Cervical five-six (N/A)  SURGEON:  Surgeon(s) and Role:    * Earnie Larsson, MD - Primary    * Ditty, Kevan Ny, MD - Assisting  PHYSICIAN ASSISTANT:   ASSISTANTS:    ANESTHESIA:   general  EBL:  100 cc  BLOOD ADMINISTERED:none  DRAINS: none   LOCAL MEDICATIONS USED:  MARCAINE     SPECIMEN:  No Specimen  DISPOSITION OF SPECIMEN:  N/A  COUNTS:  YES  TOURNIQUET:  * No tourniquets in log *  DICTATION: .Dragon Dictation  PLAN OF CARE: Admit to inpatient   PATIENT DISPOSITION:  PACU - hemodynamically stable.   Delay start of Pharmacological VTE agent (>24hrs) due to surgical blood loss or risk of bleeding: yes

## 2016-12-31 NOTE — Anesthesia Preprocedure Evaluation (Addendum)
Anesthesia Evaluation  Patient identified by MRN, date of birth, ID band Patient awake    Reviewed: Allergy & Precautions, NPO status , Patient's Chart, lab work & pertinent test results  History of Anesthesia Complications (+) PONV and history of anesthetic complications  Airway Mallampati: II  TM Distance: >3 FB Neck ROM: Full    Dental  (+) Dental Advisory Given, Edentulous Upper, Edentulous Lower   Pulmonary former smoker,    Pulmonary exam normal        Cardiovascular negative cardio ROS Normal cardiovascular exam     Neuro/Psych PSYCHIATRIC DISORDERS Anxiety Depression Bipolar Disorder    GI/Hepatic   Endo/Other  diabetes  Renal/GU Renal InsufficiencyRenal disease     Musculoskeletal   Abdominal   Peds  Hematology   Anesthesia Other Findings   Reproductive/Obstetrics                            Anesthesia Physical Anesthesia Plan  ASA: III  Anesthesia Plan: General   Post-op Pain Management:    Induction: Intravenous  PONV Risk Score and Plan: 4 or greater and Ondansetron, Dexamethasone and Scopolamine patch - Pre-op  Airway Management Planned: Oral ETT  Additional Equipment:   Intra-op Plan:   Post-operative Plan: Extubation in OR  Informed Consent: I have reviewed the patients History and Physical, chart, labs and discussed the procedure including the risks, benefits and alternatives for the proposed anesthesia with the patient or authorized representative who has indicated his/her understanding and acceptance.   Dental advisory given  Plan Discussed with: CRNA, Anesthesiologist and Surgeon  Anesthesia Plan Comments: (Multimodal anesthetic)       Anesthesia Quick Evaluation

## 2017-01-01 ENCOUNTER — Encounter (HOSPITAL_COMMUNITY): Payer: Self-pay | Admitting: Neurosurgery

## 2017-01-01 DIAGNOSIS — Z23 Encounter for immunization: Secondary | ICD-10-CM | POA: Diagnosis not present

## 2017-01-01 LAB — GLUCOSE, CAPILLARY
GLUCOSE-CAPILLARY: 248 mg/dL — AB (ref 65–99)
Glucose-Capillary: 330 mg/dL — ABNORMAL HIGH (ref 65–99)

## 2017-01-01 MED ORDER — INFLUENZA VAC SPLIT QUAD 0.5 ML IM SUSY: 0.5000 mL | PREFILLED_SYRINGE | INTRAMUSCULAR | Status: DC

## 2017-01-01 MED ORDER — CYCLOBENZAPRINE HCL 10 MG PO TABS: 10.0000 mg | ORAL_TABLET | Freq: Three times a day (TID) | ORAL | 2 refills | Status: DC | PRN

## 2017-01-01 MED ORDER — HYDROCODONE-ACETAMINOPHEN 10-325 MG PO TABS: 2.0000 | ORAL_TABLET | ORAL | 0 refills | Status: DC | PRN

## 2017-01-01 MED ORDER — INFLUENZA VAC SPLIT QUAD 0.5 ML IM SUSY
0.5000 mL | PREFILLED_SYRINGE | Freq: Once | INTRAMUSCULAR | Status: AC
  Administered 2017-01-01: 0.5 mL via INTRAMUSCULAR
  Filled 2017-01-01: qty 0.5

## 2017-01-01 NOTE — Discharge Summary (Signed)
Physician Discharge Summary  Patient ID: Diana [name] MRN: [id number] DOB/AGE: Aug 02, 1955 61 y.o.  Admit date: 12/31/2016 Discharge date: 01/01/2017  Admission Diagnoses:  Cervical pseudoarthrosis  Discharge Diagnoses:  Same Active Problems:   Cervical pseudoarthrosis [facility])   Discharged Condition: Stable  Hospital Course:  Diana [name] is a 61 y.o. female who was admitted for the below procedure. There were no post operative complications. At time of discharge, pain was well controlled, ambulating, tolerating po, voiding normal. Ready for discharge.  Treatments: Surgery C4-5-6 posterior cervical  Fusion with lateral mass instrumentation utilizing morselized autograft, allograft, and bone morphogenic protein    Discharge Exam: Blood pressure 125/84, pulse (!) 116, temperature 98.7 F (37.1 C), resp. rate 16, height 5' 5.5" (1.664 m), weight 83.5 kg (184 lb), SpO2 93 %. Awake, alert, oriented Speech fluent, appropriate CN grossly intact 5/5 BUE/BLE Wound c/d/i  Disposition: 01-Home or Self Care  Discharge Instructions    Call MD for:  difficulty breathing, headache or visual disturbances   Complete by:  As directed    Call MD for:  persistant dizziness or light-headedness   Complete by:  As directed    Call MD for:  redness, tenderness, or signs of infection (pain, swelling, redness, odor or green/yellow discharge around incision site)   Complete by:  As directed    Call MD for:  severe uncontrolled pain   Complete by:  As directed    Call MD for:  temperature >100.4   Complete by:  As directed    Diet general   Complete by:  As directed    Driving Restrictions   Complete by:  As directed    Do not drive until given clearance.   Increase activity slowly   Complete by:  As directed    Lifting restrictions   Complete by:  As directed    Do not lift anything >10lbs. Avoid bending and twisting in awkward positions. Avoid bending at the back.   May shower /  Bathe   Complete by:  As directed    In 24 hours. Okay to wash wound with warm soapy water. Avoid scrubbing the wound. Pat dry.   Remove dressing in 24 hours   Complete by:  As directed      Allergies as of 01/01/2017      Reactions   Aspirin Other (See Comments)   Gastric Bypass   Latex Other (See Comments)   blisters   Penicillins    UNSPECIFIED REACTION >"Took large quantities as a child & told to never take again Has patient had a PCN reaction causing immediate rash, facial/tongue/throat swelling, SOB or lightheadedness with hypotension: No Has patient had a PCN reaction causing severe rash involving mucus membranes or skin necrosis: No Has patient had a PCN reaction that required hospitalization: No Has patient had a PCN reaction occurring within the last 10 years: No If all of the above answers are "NO", then may proceed wi   Codeine Itching      Medication List    STOP taking these medications   HYDROcodone-acetaminophen 5-325 MG tablet Commonly known as:  NORCO/VICODIN Replaced by:  HYDROcodone-acetaminophen 10-325 MG tablet     TAKE these medications   amphetamine-dextroamphetamine 10 MG tablet Commonly known as:  ADDERALL 1 tab po bid What changed:    how much to take  how to take this  when to take this  additional instructions   atorvastatin 40 MG tablet Commonly known as:  LIPITOR TAKE (  1) TABLET BY MOUTH AT BEDTIME. What changed:  See the new instructions.   Biotin 5000 MCG Tabs Take 5,000 mcg every other day by mouth.   blood glucose meter kit and supplies Use up to four times daily as directed.   Cholecalciferol 2000 units Caps Take 1 capsule (2,000 Units total) by mouth daily.   conjugated estrogens vaginal cream Commonly known as:  PREMARIN Place 1 Applicatorful vaginally daily.   cyclobenzaprine 10 MG tablet Commonly known as:  FLEXERIL Take 1 tablet (10 mg total) by mouth 3 (three) times daily as needed for muscle spasms.    DULoxetine 30 MG capsule Commonly known as:  CYMBALTA TAKE (1) CAPSULE BY MOUTH TWICE DAILY. What changed:  See the new instructions.   gabapentin 300 MG capsule Commonly known as:  NEURONTIN TAKE 4 CAPSULES BY MOUTH AT BEDTIME. What changed:  See the new instructions.   glucose blood test strip Commonly known as:  ACCU-CHEK AVIVA PLUS Use to check glucose 4 times per day   HYDROcodone-acetaminophen 10-325 MG tablet Commonly known as:  NORCO Take 2 tablets by mouth every 4 (four) hours as needed for severe pain ((score 7 to 10)). Replaces:  HYDROcodone-acetaminophen 5-325 MG tablet   insulin aspart 100 UNIT/ML injection Commonly known as:  NOVOLOG 18 U SQ qAC--pt will be titrating med up gradually What changed:    how much to take  how to take this  when to take this  additional instructions   insulin detemir 100 UNIT/ML injection Commonly known as:  LEVEMIR 70 U SQ qhs--pt will be titrating dose up gradually What changed:    how much to take  how to take this  when to take this  additional instructions   Insulin Pen Needle 31G X 6 MM Misc Use as directed   B-D UF III MINI PEN NEEDLES 31G X 5 MM Misc Generic drug:  Insulin Pen Needle USE AS DIRECTED UP TO 4 TIMES DAILY.   lamoTRIgine 100 MG tablet Commonly known as:  LAMICTAL TAKE 1 TABLET BY MOUTH TWICE DAILY. What changed:    how much to take  how to take this  when to take this   LORazepam 2 MG tablet Commonly known as:  ATIVAN TAKE (1) TABLET BY MOUTH EVERY EIGHT HOURS AS NEEDED. What changed:    how much to take  how to take this  when to take this  reasons to take this  additional instructions   mirtazapine 30 MG tablet Commonly known as:  REMERON TAKE (1) TABLET BY MOUTH AT BEDTIME. What changed:  See the new instructions.   TUMS ULTRA 1000 400 MG chewable tablet Generic drug:  calcium elemental as carbonate Chew 1,000 mg by mouth daily.      Follow-up Information     Earnie Larsson, MD Follow up.   Specialty:  Neurosurgery Contact information: [address] [place] [address] [place] [address] [phone]           Signed: Traci Sermon 01/01/2017, 8:41 AM

## 2017-01-01 NOTE — Progress Notes (Signed)
Patient alert and oriented, mae's well, voiding adequate amount of urine, swallowing without difficulty,  c/o mild pain at time of discharge. Patient discharged home with family. Script and discharged instructions given to patient. Patient and family stated understanding of instructions given. Patient has an appointment with Dr. Annette Stable.

## 2017-01-01 NOTE — Progress Notes (Signed)
Neck sore Moving arms and legs well D/c

## 2017-01-01 NOTE — Care Management Note (Signed)
Case Management Note  Patient Details  Name: Diana [name] MRN: [id number] Date of Birth: 1955-11-05  Subjective/Objective:  From home, s/p C4-5-6 posterior cervical  Fusion with lateral mass.                      Action/Plan: NCM will follow for dc needs.   Expected Discharge Date:  01/01/17               Expected Discharge Plan:  Home/Self Care  In-House Referral:     Discharge planning Services  CM Consult  Post Acute Care Choice:    Choice offered to:     DME Arranged:    DME Agency:     HH Arranged:    HH Agency:     Status of Service:  Completed, signed off  If discussed at [employer] of Stay Meetings, dates discussed:    Additional Comments:  Zenon Mayo, RN 01/01/2017, 4:10 PM

## 2017-01-09 ENCOUNTER — Ambulatory Visit: Payer: Self-pay | Admitting: Family Medicine

## 2017-01-09 ENCOUNTER — Ambulatory Visit: Payer: Medicare HMO | Admitting: Clinical

## 2017-01-11 ENCOUNTER — Other Ambulatory Visit: Payer: Self-pay | Admitting: Family Medicine

## 2017-01-13 ENCOUNTER — Encounter: Payer: Self-pay | Admitting: Family Medicine

## 2017-01-22 ENCOUNTER — Other Ambulatory Visit: Payer: Self-pay | Admitting: Family Medicine

## 2017-01-30 DIAGNOSIS — S129XXA Fracture of neck, unspecified, initial encounter: Secondary | ICD-10-CM | POA: Diagnosis not present

## 2017-02-17 DIAGNOSIS — H40033 Anatomical narrow angle, bilateral: Secondary | ICD-10-CM | POA: Diagnosis not present

## 2017-02-17 DIAGNOSIS — H2513 Age-related nuclear cataract, bilateral: Secondary | ICD-10-CM | POA: Diagnosis not present

## 2017-03-04 DIAGNOSIS — S129XXA Fracture of neck, unspecified, initial encounter: Secondary | ICD-10-CM | POA: Diagnosis not present

## 2017-03-17 ENCOUNTER — Other Ambulatory Visit: Payer: Self-pay | Admitting: Family Medicine

## 2017-04-03 DIAGNOSIS — S129XXA Fracture of neck, unspecified, initial encounter: Secondary | ICD-10-CM | POA: Diagnosis not present

## 2017-04-14 ENCOUNTER — Other Ambulatory Visit: Payer: Self-pay | Admitting: Family Medicine

## 2017-04-14 NOTE — Telephone Encounter (Signed)
[place]  RF request for mirtazapine LOV: 05/11/16 Next ov: None Last written: 01/13/17 #90 w/ 0RF  RF request for lorazepam Last written: 10/11/16 #90 w/ 2RF  Please advise. Thanks.

## 2017-04-14 NOTE — Telephone Encounter (Signed)
Rx faxed

## 2017-04-14 NOTE — Telephone Encounter (Signed)
Rx's for lorazepam and mirtazapine done.  Pt needs f/u diabetes and anxiety in 1 month.-thx

## 2017-05-19 ENCOUNTER — Other Ambulatory Visit: Payer: Self-pay

## 2017-05-19 ENCOUNTER — Inpatient Hospital Stay (HOSPITAL_COMMUNITY)
Admission: EM | Admit: 2017-05-19 | Discharge: 2017-05-22 | DRG: 392 | Disposition: A | Discharge status: Home / Self Care | Payer: Medicare HMO | Attending: Internal Medicine | Admitting: Internal Medicine

## 2017-05-19 ENCOUNTER — Encounter (HOSPITAL_COMMUNITY): Payer: Self-pay | Admitting: Emergency Medicine

## 2017-05-19 ENCOUNTER — Emergency Department (HOSPITAL_COMMUNITY): Payer: Medicare HMO

## 2017-05-19 DIAGNOSIS — N390 Urinary tract infection, site not specified: Secondary | ICD-10-CM

## 2017-05-19 DIAGNOSIS — E782 Mixed hyperlipidemia: Secondary | ICD-10-CM | POA: Diagnosis not present

## 2017-05-19 DIAGNOSIS — N182 Chronic kidney disease, stage 2 (mild): Secondary | ICD-10-CM | POA: Diagnosis present

## 2017-05-19 DIAGNOSIS — F3181 Bipolar II disorder: Secondary | ICD-10-CM | POA: Diagnosis present

## 2017-05-19 DIAGNOSIS — R932 Abnormal findings on diagnostic imaging of liver and biliary tract: Secondary | ICD-10-CM | POA: Diagnosis present

## 2017-05-19 DIAGNOSIS — Z880 Allergy status to penicillin: Secondary | ICD-10-CM | POA: Diagnosis not present

## 2017-05-19 DIAGNOSIS — Z91040 Latex allergy status: Secondary | ICD-10-CM

## 2017-05-19 DIAGNOSIS — E876 Hypokalemia: Secondary | ICD-10-CM | POA: Diagnosis present

## 2017-05-19 DIAGNOSIS — Z794 Long term (current) use of insulin: Secondary | ICD-10-CM

## 2017-05-19 DIAGNOSIS — Z87442 Personal history of urinary calculi: Secondary | ICD-10-CM | POA: Diagnosis not present

## 2017-05-19 DIAGNOSIS — F17200 Nicotine dependence, unspecified, uncomplicated: Secondary | ICD-10-CM | POA: Diagnosis not present

## 2017-05-19 DIAGNOSIS — K909 Intestinal malabsorption, unspecified: Secondary | ICD-10-CM | POA: Diagnosis present

## 2017-05-19 DIAGNOSIS — Z87891 Personal history of nicotine dependence: Secondary | ICD-10-CM

## 2017-05-19 DIAGNOSIS — E1122 Type 2 diabetes mellitus with diabetic chronic kidney disease: Secondary | ICD-10-CM | POA: Diagnosis present

## 2017-05-19 DIAGNOSIS — R197 Diarrhea, unspecified: Secondary | ICD-10-CM | POA: Diagnosis not present

## 2017-05-19 DIAGNOSIS — Z886 Allergy status to analgesic agent status: Secondary | ICD-10-CM | POA: Diagnosis not present

## 2017-05-19 DIAGNOSIS — Z79899 Other long term (current) drug therapy: Secondary | ICD-10-CM | POA: Diagnosis not present

## 2017-05-19 DIAGNOSIS — R112 Nausea with vomiting, unspecified: Secondary | ICD-10-CM

## 2017-05-19 DIAGNOSIS — Z9884 Bariatric surgery status: Secondary | ICD-10-CM | POA: Diagnosis not present

## 2017-05-19 DIAGNOSIS — E669 Obesity, unspecified: Secondary | ICD-10-CM | POA: Diagnosis not present

## 2017-05-19 DIAGNOSIS — E1165 Type 2 diabetes mellitus with hyperglycemia: Secondary | ICD-10-CM | POA: Diagnosis present

## 2017-05-19 DIAGNOSIS — M1990 Unspecified osteoarthritis, unspecified site: Secondary | ICD-10-CM | POA: Diagnosis present

## 2017-05-19 DIAGNOSIS — Z6830 Body mass index (BMI) 30.0-30.9, adult: Secondary | ICD-10-CM | POA: Diagnosis not present

## 2017-05-19 DIAGNOSIS — IMO0002 Reserved for concepts with insufficient information to code with codable children: Secondary | ICD-10-CM | POA: Diagnosis present

## 2017-05-19 DIAGNOSIS — K7460 Unspecified cirrhosis of liver: Secondary | ICD-10-CM | POA: Diagnosis present

## 2017-05-19 DIAGNOSIS — A084 Viral intestinal infection, unspecified: Principal | ICD-10-CM | POA: Diagnosis present

## 2017-05-19 DIAGNOSIS — R1110 Vomiting, unspecified: Secondary | ICD-10-CM | POA: Diagnosis not present

## 2017-05-19 DIAGNOSIS — R109 Unspecified abdominal pain: Secondary | ICD-10-CM | POA: Diagnosis not present

## 2017-05-19 HISTORY — DX: Abnormal findings on diagnostic imaging of liver and biliary tract: R93.2

## 2017-05-19 HISTORY — DX: Bariatric surgery status: Z98.84

## 2017-05-19 LAB — CBC
HCT: 39.8 % (ref 36.0–46.0)
Hemoglobin: 13.1 g/dL (ref 12.0–15.0)
MCH: 30.1 pg (ref 26.0–34.0)
MCHC: 32.9 g/dL (ref 30.0–36.0)
MCV: 91.5 fL (ref 78.0–100.0)
PLATELETS: 339 10*3/uL (ref 150–400)
RBC: 4.35 MIL/uL (ref 3.87–5.11)
RDW: 13.9 % (ref 11.5–15.5)
WBC: 10.9 10*3/uL — ABNORMAL HIGH (ref 4.0–10.5)

## 2017-05-19 LAB — URINALYSIS, MICROSCOPIC (REFLEX): RBC / HPF: NONE SEEN RBC/hpf (ref 0–5)

## 2017-05-19 LAB — COMPREHENSIVE METABOLIC PANEL
ALBUMIN: 3.8 g/dL (ref 3.5–5.0)
ALK PHOS: 101 U/L (ref 38–126)
ALT: 16 U/L (ref 14–54)
AST: 17 U/L (ref 15–41)
Anion gap: 14 (ref 5–15)
BUN: 15 mg/dL (ref 6–20)
CALCIUM: 9.4 mg/dL (ref 8.9–10.3)
CHLORIDE: 98 mmol/L — AB (ref 101–111)
CO2: 22 mmol/L (ref 22–32)
CREATININE: 0.95 mg/dL (ref 0.44–1.00)
GFR calc Af Amer: >60 mL/min (ref >60)
GFR calc non Af Amer: >60 mL/min (ref >60)
GLUCOSE: 296 mg/dL — AB (ref 65–99)
Potassium: 3.2 mmol/L — ABNORMAL LOW (ref 3.5–5.1)
SODIUM: 134 mmol/L — AB (ref 135–145)
Total Bilirubin: 0.7 mg/dL (ref 0.3–1.2)
Total Protein: 8.1 g/dL (ref 6.5–8.1)

## 2017-05-19 LAB — URINALYSIS, ROUTINE W REFLEX MICROSCOPIC
BILIRUBIN URINE: NEGATIVE
Glucose, UA: 100 mg/dL — AB
HGB URINE DIPSTICK: NEGATIVE
Ketones, ur: NEGATIVE mg/dL
Leukocytes, UA: NEGATIVE
Nitrite: POSITIVE — AB
Specific Gravity, Urine: 1.025 (ref 1.005–1.030)
pH: 6 (ref 5.0–8.0)

## 2017-05-19 LAB — CBG MONITORING, ED
GLUCOSE-CAPILLARY: 254 mg/dL — AB (ref 65–99)
Glucose-Capillary: 292 mg/dL — ABNORMAL HIGH (ref 65–99)
Glucose-Capillary: 231 mg/dL — ABNORMAL HIGH (ref 65–99)

## 2017-05-19 LAB — LIPASE, BLOOD: LIPASE: 22 U/L (ref 11–51)

## 2017-05-19 MED ORDER — ONDANSETRON HCL 4 MG/2ML IJ SOLN
4.0000 mg | Freq: Once | INTRAMUSCULAR | Status: AC
  Administered 2017-05-19: 4 mg via INTRAVENOUS
  Filled 2017-05-19: qty 2

## 2017-05-19 MED ORDER — MORPHINE SULFATE (PF) 4 MG/ML IV SOLN
4.0000 mg | Freq: Once | INTRAVENOUS | Status: AC
  Administered 2017-05-19: 4 mg via INTRAVENOUS
  Filled 2017-05-19: qty 1

## 2017-05-19 MED ORDER — SODIUM CHLORIDE 0.9 % IV BOLUS
1000.0000 mL | Freq: Once | INTRAVENOUS | Status: AC
  Administered 2017-05-19: 1000 mL via INTRAVENOUS

## 2017-05-19 MED ORDER — POTASSIUM CHLORIDE CRYS ER 20 MEQ PO TBCR
20.0000 meq | EXTENDED_RELEASE_TABLET | Freq: Once | ORAL | Status: AC
  Administered 2017-05-19: 20 meq via ORAL
  Filled 2017-05-19: qty 1

## 2017-05-19 MED ORDER — IOPAMIDOL (ISOVUE-300) INJECTION 61%
100.0000 mL | Freq: Once | INTRAVENOUS | Status: AC | PRN
  Administered 2017-05-19: 100 mL via INTRAVENOUS

## 2017-05-19 NOTE — ED Notes (Signed)
Pt cursing at RN upset with wait time. Informed pt about delay, rechecked sugar and vitals. Security standby.

## 2017-05-19 NOTE — ED Triage Notes (Signed)
Pt states diarrhea for 8 days.

## 2017-05-19 NOTE — ED Provider Notes (Signed)
[facility] EMERGENCY DEPARTMENT Provider Note   CSN: [id number] Arrival date & time: 05/19/17  1405     History   Chief Complaint Chief Complaint  Patient presents with  . Diarrhea    HPI Diana [name] is a 62 y.o. female.  She is complaining of nausea vomiting diarrhea and abdominal pain for 8 days.  She kept thinking it was getting get better but eventually today she decided to come to the ER.  She has not seen her PCP for this.  She states she has had fever and chills.  She has had too numerous to count episodes of diarrhea that are associated with a little bit of pink tinge sometimes.  There is been diffuse abdominal pain.  There is been multiple episodes of vomiting but no signs of blood in the vomit.  No rectal pain, no chest pain no shortness of breath.  She is tried Pepto-Bismol and then Imodium without any relief.  She feels weak all over.  She has not been able to take her medications much due to her vomiting.  The history is provided by the patient.  Diarrhea   This is a new problem. The current episode started more than 1 week ago. The problem occurs more than 10 times per day. The problem has not changed since onset.The stool consistency is described as blood tinged and watery. Maximum temperature: subjective. Associated symptoms include abdominal pain, vomiting, chills and sweats. Pertinent negatives include no headaches, no URI and no cough. She has tried analgesics and anti-motility drugs for the symptoms. The treatment provided no relief. Past medical history comments: gastric bypass, roux-en-Y.    Past Medical History:  Diagnosis Date  . Abnormal mammogram 12/2014   F/u diagnostic mammo and u/s NORMAL--resume annual screening mammography  . Arthritis   . Bipolar disorder ([place])    questionable  . Cervical radiculopathy    cervical spondylosis on 2013 MRI.  Dr. Annette Stable to get CT myelogram as of 11/28/16 consult--confirmed pseudoarthrosis C5-6 + progressive disc bulge &  early stenosis at C4-5: Dr. Trenton Gammon then did posterior cervical fusion.  . Chronic fatigue   . Chronic nausea 2012/13   Normal upper GI  12/2011 (mild GERD noted +small hiatal hernia)+  . Chronic pain syndrome    Chronic C spine pain with radiculopathy.  Dr. Francesco Runner at [facility] was her pain mgmt MD and he discharged her from [facility] mgmt center b/c she failed a UDS (+marijuana) 04/27/15  . Chronic renal insufficiency, stage II (mild) 02/2015   GFR 70s  . Colon cancer screening 03/06/2011   iFob 11/2010 through her insurer was negative. iFOB 01/2015 NEG.  . Depression   . Diabetes mellitus    urine protein-Cr ratio nl 09/2010, D.R. Screen neg 10/2009  . Fibromyalgia   . Fracture of rib of left side 08/14/15   5th and 6th  . Hepatic steatosis 2005   ultrasound  . History of kidney stones   . Hyperlipidemia, mixed 2017   Waiting to start statin (trying to take one thing at a time with her, working on glucose control first)  . Insomnia   . Iron deficiency anemia 09/2009   malabsorbtion s/p bariatric surgery  . Nephrolithiasis 11/2012  . Neurosis, anxiety, panic type   . Obesity    s/p bariatric surgery  . Osteopenia 12/2010   Hip T score -2.0.  Spine T score -0.5 (plan to repeat in 2 yrs)  . Other B-complex deficiencies   . Panic  attacks   . PONV (postoperative nausea and vomiting)   . Restless legs syndrome   . Syncopal episodes    7/73/17 in the setting of dehydration and sedative medication  . Tobacco dependence   . Vitamin D deficiency 09/2009  . Wears dentures    uppers and lowers  . Wears glasses     Patient Active Problem List   Diagnosis Date Noted  . 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 Fracture of multiple ribs 08/29/2015  . Diabetes mellitus without complication ([place]) 08/11/1599  . BPPV (benign paroxysmal positional vertigo) 08/26/2013  . Polypharmacy 08/26/2013  . Tachycardia 06/16/2013  . Recurrent nephrolithiasis 11/26/2012  . Hydronephrosis 11/21/2012  .  Renal stone 11/20/2012  . Chronic nausea 01/30/2012  . Dysphagia 01/30/2012  . Foot pain, left 01/06/2012  . Tobacco dependence 12/02/2011  . Constipation, chronic 03/05/2011  . Grief reaction 11/02/2010  . Cervical pain 10/03/2010  . Malabsorption syndrome 08/02/2010  . ABDOMINAL PAIN, RECURRENT 04/05/2010  . B12 DEFICIENCY 04/04/2010  . HYPERLIPIDEMIA 02/27/2010  . DEPRESSION, RECURRENT 02/27/2010  . Diabetes type 2, uncontrolled ([place]) 01/23/2010  . UNSPECIFIED VITAMIN D DEFICIENCY 01/23/2010  . UNSPECIFIED IRON DEFICIENCY ANEMIA 01/23/2010  . Bipolar II disorder ([place]) 01/23/2010  . GAD (generalized anxiety disorder) 01/23/2010  . RESTLESS LEG SYNDROME 01/23/2010  . Chronic fatigue 01/23/2010    Past Surgical History:  Procedure Laterality Date  . ABDOMINAL HYSTERECTOMY     fibroids, ovaries remain  . APPENDECTOMY    . BACK SURGERY    . BLADDER SURGERY     bladder tack  . CERVICAL SPINE SURGERY  09/2009; 2014   2014-ant cerv decomp (Dr. Carloyn Manner)  . CHOLECYSTECTOMY  04/2010   w/lysis of adhesions (open procedure)--path showed chronic cholecystitis and cholesterol polyp.  . CYSTOSCOPY/RETROGRADE/URETEROSCOPY/STONE EXTRACTION WITH BASKET  11/2012   Left UVJ stone  . CYSTOSCOPY/RETROGRADE/URETEROSCOPY/STONE EXTRACTION WITH BASKET Left 11/21/2012   Procedure: CYSTOSCOPY/RETROGRADE/URETEROSCOPY/STONE EXTRACTION WITH BASKET insertion double j stent;  Surgeon: Ailene Rud, MD;  Location: [facility];  Service: Urology;  Laterality: Left;  . EPIDURAL BLOCK INJECTION  03/2011   Cervical  . HERNIA REPAIR  2005   mesenteric hernia repair, with lysis of adhesions (presented with SBO)  . POSTERIOR CERVICAL FUSION/FORAMINOTOMY N/A 12/31/2016   Procedure: Posterior Cervical Fusion with lateral mass fixation - Cervical four-five, Cervical five-six;  Surgeon: Earnie Larsson, MD;  Location: [place];  Service: Neurosurgery;  Laterality: N/A;  . ROUX-EN-Y GASTRIC BYPASS    . TONSILLECTOMY    .  TONSILLECTOMY       OB History   None      Home Medications    Prior to Admission medications   Medication Sig Start Date End Date Taking? Authorizing Provider  amphetamine-dextroamphetamine (ADDERALL) 10 MG tablet 1 tab po bid Patient taking differently: Take 10 mg 2 (two) times daily with a meal by mouth.  10/11/16   McGowen, Adrian Blackwater, MD  atorvastatin (LIPITOR) 40 MG tablet TAKE (1) TABLET BY MOUTH AT BEDTIME. 03/17/17   McGowen, Adrian Blackwater, MD  B-D UF III MINI PEN NEEDLES 31G X 5 MM MISC USE AS DIRECTED UP TO 4 TIMES DAILY. Patient not taking: Reported on 12/24/2016 08/15/16   Tammi Sou, MD  Biotin 5000 MCG TABS Take 5,000 mcg every other day by mouth.     [provider]  blood glucose meter kit and supplies Use up to four times daily as directed. Patient not taking: Reported on 12/24/2016 08/08/15   McGowen,  Adrian Blackwater, MD  calcium elemental as carbonate (TUMS ULTRA 1000) 400 MG chewable tablet Chew 1,000 mg by mouth daily.      [provider]  Cholecalciferol 2000 UNITS CAPS Take 1 capsule (2,000 Units total) by mouth daily. 12/21/14   McGowen, Adrian Blackwater, MD  conjugated estrogens (PREMARIN) vaginal cream Place 1 Applicatorful vaginally daily. Patient not taking: Reported on 12/03/2016 03/22/16   Tammi Sou, MD  cyclobenzaprine (FLEXERIL) 10 MG tablet Take 1 tablet (10 mg total) by mouth 3 (three) times daily as needed for muscle spasms. 01/01/17   Costella, Vista Mink, PA-C  DULoxetine (CYMBALTA) 30 MG capsule TAKE (1) CAPSULE BY MOUTH TWICE DAILY. Patient taking differently: TAKE 30 MG BY MOUTH TWICE DAILY. 11/13/16   McGowen, Adrian Blackwater, MD  gabapentin (NEURONTIN) 300 MG capsule TAKE 4 CAPSULES BY MOUTH AT BEDTIME. Patient taking differently: TAKE 1200 MG BY MOUTH AT BEDTIME. 06/19/16   McGowen, Adrian Blackwater, MD  glucose blood (ACCU-CHEK AVIVA PLUS) test strip Use to check glucose 4 times per day Patient not taking: Reported on 12/24/2016 12/22/15   McGowen, Adrian Blackwater, MD  HYDROcodone-acetaminophen (NORCO) 10-325 MG tablet Take 2 tablets by mouth every 4 (four) hours as needed for severe pain ((score 7 to 10)). 01/01/17   Costella, Vista Mink, PA-C  insulin aspart (NOVOLOG) 100 UNIT/ML injection 18 U SQ qAC--pt will be titrating med up gradually Patient taking differently: Inject 18 Units 3 (three) times daily with meals into the skin.  10/11/16   McGowen, Adrian Blackwater, MD  insulin detemir (LEVEMIR) 100 UNIT/ML injection 70 U SQ qhs--pt will be titrating dose up gradually Patient taking differently: Inject 78 Units at bedtime into the skin.  10/11/16   McGowen, Adrian Blackwater, MD  Insulin Pen Needle 31G X 6 MM MISC Use as directed Patient not taking: Reported on 12/24/2016 08/26/13   Tammi Sou, MD  lamoTRIgine (LAMICTAL) 100 MG tablet TAKE 1 TABLET BY MOUTH TWICE DAILY. Patient taking differently: TAKE 100 MG BY MOUTH TWICE DAILY. 11/13/16   McGowen, Adrian Blackwater, MD  LORazepam (ATIVAN) 2 MG tablet TAKE (1) TABLET BY MOUTH EVERY EIGHT HOURS AS NEEDED. Patient taking differently: Take 2 mg every 8 (eight) hours as needed by mouth for anxiety.  10/11/16   McGowen, Adrian Blackwater, MD  LORazepam (ATIVAN) 2 MG tablet TAKE (1) TABLET BY MOUTH EVERY EIGHT HOURS AS NEEDED. 04/14/17   McGowen, Adrian Blackwater, MD  mirtazapine (REMERON) 30 MG tablet TAKE (1) TABLET BY MOUTH AT BEDTIME. 04/14/17   McGowen, Adrian Blackwater, MD    Family History Family History  Problem Relation Age of Onset  . Heart disease Mother   . Cancer Mother   . Heart disease Father   . Cancer Sister   . Heart disease Sister   . Alzheimer's disease Brother   . Kidney disease Brother     Social History Social History   Tobacco Use  . Smoking status: Former Smoker    Packs/day: 1.00    Years: 1.00    Pack years: 1.00    Types: Cigarettes    Last attempt to quit: 04/11/2016    Years since quitting: 1.1  . Smokeless tobacco: Never Used  Substance Use Topics  . Alcohol use: No  . Drug use: No     Allergies     Aspirin; Latex; Penicillins; and Codeine   Review of Systems Review of Systems  Constitutional: Positive for chills, fatigue and fever.  HENT: Negative for sinus  pain and sore throat.   Eyes: Negative for pain and visual disturbance.  Respiratory: Negative for cough and shortness of breath.   Cardiovascular: Negative for chest pain and palpitations.  Gastrointestinal: Positive for abdominal pain, blood in stool, diarrhea, nausea and vomiting.  Genitourinary: Negative for dysuria, frequency, vaginal bleeding and vaginal discharge.  Musculoskeletal: Positive for back pain and neck pain.  Skin: Negative for rash.  Neurological: Negative for seizures and headaches.     Physical Exam Updated Vital Signs BP 130/70 (BP Location: Right Arm)   Pulse (!) 105   Temp 98.8 F (37.1 C) (Oral)   Resp 18   Ht _0  (1.626 m)   Wt 79.4 kg (175 lb)   SpO2 96%   BMI 30.04 kg/m   Physical Exam  Constitutional: She appears well-developed and well-nourished. No distress.  HENT:  Head: Normocephalic and atraumatic.  Eyes: Conjunctivae are normal.  Neck: Neck supple.  Cardiovascular: Regular rhythm. Tachycardia present.  No murmur heard. Pulmonary/Chest: Effort normal and breath sounds normal. No respiratory distress.  Abdominal: Soft. Normal appearance and bowel sounds are normal. There is generalized tenderness. There is no rigidity, no rebound and no guarding.  Musculoskeletal: Normal range of motion. She exhibits no edema or deformity.  Neurological: She is alert.  Skin: Skin is warm and dry. Capillary refill takes less than 2 seconds.  Psychiatric: She has a normal mood and affect.  Nursing note and vitals reviewed.    ED Treatments / Results  Labs (all labs ordered are listed, but only abnormal results are displayed) Labs Reviewed  COMPREHENSIVE METABOLIC PANEL - Abnormal; Notable for the following components:      Result Value   Sodium 134 (*)    Potassium 3.2 (*)     Chloride 98 (*)    Glucose, Bld 296 (*)    All other components within normal limits  CBC - Abnormal; Notable for the following components:   WBC 10.9 (*)    All other components within normal limits  URINALYSIS, ROUTINE W REFLEX MICROSCOPIC - Abnormal; Notable for the following components:   Glucose, UA 100 (*)    Protein, ur TRACE (*)    Nitrite POSITIVE (*)    All other components within normal limits  URINALYSIS, MICROSCOPIC (REFLEX) - Abnormal; Notable for the following components:   Bacteria, UA RARE (*)    Squamous Epithelial / LPF 0-5 (*)    All other components within normal limits  CBG MONITORING, ED - Abnormal; Notable for the following components:   Glucose-Capillary 292 (*)    All other components within normal limits  CBG MONITORING, ED - Abnormal; Notable for the following components:   Glucose-Capillary 231 (*)    All other components within normal limits  LIPASE, BLOOD    EKG None  Radiology Ct Abdomen Pelvis W Contrast  Result Date: 05/19/2017 CLINICAL DATA:  Complaining of nausea, vomiting, diarrhea and abdominal pain x8 days. Fever chills. Pain is diffuse. EXAM: CT ABDOMEN AND PELVIS WITH CONTRAST TECHNIQUE: Multidetector CT imaging of the abdomen and pelvis was performed using the standard protocol following bolus administration of intravenous contrast. CONTRAST:  161m ISOVUE-300 IOPAMIDOL (ISOVUE-300) INJECTION 61% COMPARISON:  08/14/2015 CT FINDINGS: Lower chest: Top-normal size heart without pericardial effusion. Minimal subsegmental atelectasis in the lingula. No effusion or pneumothorax. Hepatobiliary: Hepatomegaly with morphologic changes of cirrhosis. Subcapsular areas of hypodensity along the right and left hepatic lobes may reflect old remote subcapsular areas of trauma/hemorrhage or potentially infarcts. Subtle tubular areas  of hypodensity in the right hepatic lobe may reflect chronic posttraumatic change//aerations versus biliary dilatation. No enhancing  masses. Scattered subcentimeter rounded hypodensities are also noted statistically consistent with cysts or hemangiomata. Small foci of infection/abscess believed less likely. Status post cholecystectomy. Pancreas: Normal without ductal dilatation or mass. Spleen: Calcified granuloma in the spleen.  No splenomegaly Adrenals/Urinary Tract: Normal bilateral adrenal glands. Symmetric cortical enhancement of both kidneys without nephrolithiasis nor obstructive uropathy. Bladder is unremarkable for degree of distention. Stomach/Bowel: Status post Roux-en-Y gastric bypass. No dehiscence across the staple line. Stenotic appearing jejunojejunostomy site with more proximal dilatation of jejunum and fecalized material within. Status post appendectomy. Unremarkable colon. Vascular/Lymphatic: Mild aortoiliac atherosclerosis. No aneurysm. No adenopathy. Reproductive: Hysterectomy.  No adnexal mass. Other: No free air nor free fluid Musculoskeletal: No acute nor suspicious osseous abnormality. IMPRESSION: 1. 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 2. Morphologic appearance of cirrhosis involving the liver. New subcapsular areas of hypodensity involving the right and left hepatic lobes may represent stigmata of old remote subcapsular hematoma and trauma. Subtle linear lucencies also noted in the right hepatic lobe that may also reflect old posttraumatic change. The possibility of mild intrahepatic biliary dilatation is not entirely excluded. Scattered small hypodensities too small to further characterize are also seen scattered within the liver. Statistically these may represent small cysts or hemangiomata. Small foci of infection not entirely excluded but believed less likely. Nonemergent MRI may help for further correlation. Electronically Signed   By: Ashley Royalty M.D.   On: 05/19/2017 21:49     Procedures Procedures (including critical care time)  Medications Ordered in ED Medications - No data to display   Initial Impression / Assessment and Plan / ED Course  I have reviewed the triage vital signs and the nursing notes.  Pertinent labs & imaging results that were available during my care of the patient were reviewed by me and considered in my medical decision making (see chart for details).  Clinical Course as of May 22 1031  Mon May 19, 2017  2219 Discussed with Dr. Arnoldo Morale.  He feels the patient should be admitted to the hospitalist service and he will follow along.  He wants to review the images with the radiologist and see how she does.  He is saying she can have clear liquids but if it is causing her to vomit then she should back off.  He does not think she needs an NG at this time.  I am paging the hospitalist for admission.   [MB]  2242 Discussed with Dr. Darrick Meigs hospital medicine who will admit the patient to his service.   [MB]    Clinical Course User Index [MB] Hayden Rasmussen, MD     Final Clinical Impressions(s) / ED Diagnoses   Final diagnoses:  Nausea vomiting and diarrhea    ED Discharge Orders    None       Hayden Rasmussen, MD 05/21/17 [phone]

## 2017-05-19 NOTE — ED Notes (Signed)
Patient was given sandwich, chips and diet drink for consumption.

## 2017-05-19 NOTE — H&P (Signed)
TRH H&P    Patient Demographics:    Diana [name], is a 62 y.o. female  MRN: [id number]  DOB - [birth year]  Admit Date - 05/19/2017  Referring MD/NP/PA: Dr. Melina Copa  Outpatient Primary MD for the patient is McGowen, Adrian Blackwater, MD  Patient coming from: Home  Chief complaint-vomiting and diarrhea   HPI:    Diana [name]  is a 62 y.o. female, with history of Roux-en-Y gastric bypass, cervical spine surgery, diabetes mellitus, restless leg syndrome came to hospital with complaints of vomiting, retching and diarrhea for past 1 week.  This was associated with abdominal pain.  She also had fever and chills for 2 days.  She also had vomiting but complains of more itching than vomiting.  Patient took Pepto-Bismol and Imodium without any relief. In the ED, CT scan of the abdomen and pelvis showed stenosis of stricture suspected at the jejunojejunostomy site of the Roux-en-Y gastric bypass as there is a fecal lysed material within the jejunal limb leading to the staple line. General surgery was consulted by ED physician, Dr. Arnoldo Morale will see the patient in a.m. She denies chest pain or shortness of breath Denies dysuria. No previous history of stroke or seizures Denies blood in the stool    Review of systems:    In addition to the HPI above,   All other systems reviewed and are negative.   With Past History of the following :    Past Medical History:  Diagnosis Date  . Abnormal mammogram 12/2014   F/u diagnostic mammo and u/s NORMAL--resume annual screening mammography  . Arthritis   . Bipolar disorder ([place])    questionable  . Cervical radiculopathy    cervical spondylosis on 2013 MRI.  Dr. 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 Trenton Gammon then did posterior cervical fusion.  . Chronic fatigue   . Chronic nausea  2012/13   Normal upper GI  12/2011 (mild GERD noted +small hiatal hernia)+  . Chronic pain syndrome    Chronic C spine pain with radiculopathy.  Dr. Francesco Runner at [facility] was her pain mgmt MD and he discharged her from [facility] pain mgmt center b/c she failed a UDS (+marijuana) 04/27/15  . Chronic renal insufficiency, stage II (mild) 02/2015   GFR 70s  . Colon cancer screening 03/06/2011   iFob 11/2010 through her insurer was negative. iFOB 01/2015 NEG.  . Depression   . Diabetes mellitus    urine protein-Cr ratio nl 09/2010, D.R. Screen neg 10/2009  . Fibromyalgia   . Fracture of rib of left side 08/14/15   5th and 6th  . Hepatic steatosis 2005   ultrasound  . History of kidney stones   . Hyperlipidemia, mixed 2017   Waiting to start statin (trying to take one thing at a time with her, working on glucose control first)  . Insomnia   . Iron deficiency anemia 09/2009   malabsorbtion s/p bariatric surgery  . Nephrolithiasis 11/2012  . Neurosis, anxiety, panic  type   . Obesity    s/p bariatric surgery  . Osteopenia 12/2010   Hip T score -2.0.  Spine T score -0.5 (plan to repeat in 2 yrs)  . Other B-complex deficiencies   . Panic attacks   . PONV (postoperative nausea and vomiting)   . Restless legs syndrome   . Syncopal episodes    7/73/17 in the setting of dehydration and sedative medication  . Tobacco dependence   . Vitamin D deficiency 09/2009  . Wears dentures    uppers and lowers  . Wears glasses       Past Surgical History:  Procedure Laterality Date  . ABDOMINAL HYSTERECTOMY     fibroids, ovaries remain  . APPENDECTOMY    . BACK SURGERY    . BLADDER SURGERY     bladder tack  . CERVICAL SPINE SURGERY  09/2009; 2014   2014-ant cerv decomp (Dr. Carloyn Manner)  . CHOLECYSTECTOMY  04/2010   w/lysis of adhesions (open procedure)--path showed chronic cholecystitis and cholesterol polyp.  . CYSTOSCOPY/RETROGRADE/URETEROSCOPY/STONE EXTRACTION WITH BASKET  11/2012   Left UVJ stone  .  CYSTOSCOPY/RETROGRADE/URETEROSCOPY/STONE EXTRACTION WITH BASKET Left 11/21/2012   Procedure: CYSTOSCOPY/RETROGRADE/URETEROSCOPY/STONE EXTRACTION WITH BASKET insertion double j stent;  Surgeon: Ailene Rud, MD;  Location: [facility];  Service: Urology;  Laterality: Left;  . EPIDURAL BLOCK INJECTION  03/2011   Cervical  . HERNIA REPAIR  2005   mesenteric hernia repair, with lysis of adhesions (presented with SBO)  . POSTERIOR CERVICAL FUSION/FORAMINOTOMY N/A 12/31/2016   Procedure: Posterior Cervical Fusion with lateral mass fixation - Cervical four-five, Cervical five-six;  Surgeon: Earnie Larsson, MD;  Location: [place];  Service: Neurosurgery;  Laterality: N/A;  . ROUX-EN-Y GASTRIC BYPASS    . TONSILLECTOMY    . TONSILLECTOMY        Social History:      Social History   Tobacco Use  . Smoking status: Former Smoker    Packs/day: 1.00    Years: 1.00    Pack years: 1.00    Types: Cigarettes    Last attempt to quit: 04/11/2016    Years since quitting: 1.1  . Smokeless tobacco: Never Used  Substance Use Topics  . Alcohol use: No       Family History :     Family History  Problem Relation Age of Onset  . Heart disease Mother   . Cancer Mother   . Heart disease Father   . Cancer Sister   . Heart disease Sister   . Alzheimer's disease Brother   . Kidney disease Brother       Home Medications:   Prior to Admission medications   Medication Sig Start Date End Date Taking? Authorizing Provider  amphetamine-dextroamphetamine (ADDERALL) 10 MG tablet 1 tab po bid Patient taking differently: Take 10 mg 2 (two) times daily with a meal by mouth.  10/11/16  Yes McGowen, Adrian Blackwater, MD  atorvastatin (LIPITOR) 40 MG tablet TAKE (1) TABLET BY MOUTH AT BEDTIME. 03/17/17  Yes McGowen, Adrian Blackwater, MD  calcium elemental as carbonate (TUMS ULTRA 1000) 400 MG chewable tablet Chew 1,000 mg by mouth daily.     Yes [provider]  Cholecalciferol 2000 UNITS CAPS Take 1 capsule (2,000 Units  total) by mouth daily. 12/21/14  Yes McGowen, Adrian Blackwater, MD  DULoxetine (CYMBALTA) 30 MG capsule TAKE (1) CAPSULE BY MOUTH TWICE DAILY. Patient taking differently: TAKE 30 MG BY MOUTH TWICE DAILY. 11/13/16  Yes McGowen, Adrian Blackwater, MD  gabapentin (  NEURONTIN) 300 MG capsule TAKE 4 CAPSULES BY MOUTH AT BEDTIME. Patient taking differently: TAKE 1200 MG BY MOUTH AT BEDTIME. 06/19/16  Yes McGowen, Adrian Blackwater, MD  HYDROcodone-acetaminophen (NORCO) 10-325 MG tablet Take 1-1.5 tablets by mouth every 4 (four) hours as needed for moderate pain or severe pain.   Yes [provider]  insulin aspart (NOVOLOG) 100 UNIT/ML injection 18 U SQ qAC--pt will be titrating med up gradually Patient taking differently: Inject 18-26 Units into the skin 3 (three) times daily with meals. Sliding scale 10/11/16  Yes McGowen, Adrian Blackwater, MD  insulin detemir (LEVEMIR) 100 UNIT/ML injection 70 U SQ qhs--pt will be titrating dose up gradually Patient taking differently: Inject 85 Units into the skin at bedtime.  10/11/16  Yes McGowen, Adrian Blackwater, MD  lamoTRIgine (LAMICTAL) 100 MG tablet TAKE 1 TABLET BY MOUTH TWICE DAILY. Patient taking differently: TAKE 100 MG BY MOUTH TWICE DAILY. 11/13/16  Yes McGowen, Adrian Blackwater, MD  LORazepam (ATIVAN) 2 MG tablet TAKE (1) TABLET BY MOUTH EVERY EIGHT HOURS AS NEEDED. 04/14/17  Yes McGowen, Adrian Blackwater, MD  mirtazapine (REMERON) 30 MG tablet TAKE (1) TABLET BY MOUTH AT BEDTIME. 04/14/17  Yes McGowen, Adrian Blackwater, MD  tiZANidine (ZANAFLEX) 4 MG tablet Take 4 mg by mouth 2 (two) times daily. 04/14/17  Yes [provider]  LORazepam (ATIVAN) 2 MG tablet TAKE (1) TABLET BY MOUTH EVERY EIGHT HOURS AS NEEDED. Patient taking differently: Take 2 mg every 8 (eight) hours as needed by mouth for anxiety.  10/11/16   McGowen, Adrian Blackwater, MD     Allergies:     Allergies  Allergen Reactions  . Aspirin Other (See Comments)    Gastric Bypass  . Latex Other (See Comments)    blisters  . Penicillins     UNSPECIFIED  REACTION >"Took large quantities as a child & told to never take again Has patient had a PCN reaction causing immediate rash, facial/tongue/throat swelling, SOB or lightheadedness with hypotension: No Has patient had a PCN reaction causing severe rash involving mucus membranes or skin necrosis: No Has patient had a PCN reaction that required hospitalization: No Has patient had a PCN reaction occurring within the last 10 years: No If all of the above answers are "NO", then may proceed wi  . Codeine Itching     Physical Exam:   Vitals  Blood pressure (!) 106/59, pulse (!) 101, temperature 98.8 F (37.1 C), temperature source Oral, resp. rate 18, height 5' 4"  (1.626 m), weight 79.4 kg (175 lb), SpO2 97 %.  1.  General: Appears in mild distress due to the pain  2. Psychiatric:  Intact judgement and  insight, awake alert, oriented x 3.  3. Neurologic: No focal neurological deficits, all cranial nerves intact.Strength 5/5 all 4 extremities, sensation intact all 4 extremities, plantars down going.  4. Eyes :  anicteric sclerae, moist conjunctivae with no lid lag. PERRLA.  5. ENMT:  Oropharynx clear with moist mucous membranes and good dentition  6. Neck:  supple, no cervical lymphadenopathy appriciated, No thyromegaly  7. Respiratory : Normal respiratory effort, good air movement bilaterally,clear to  auscultation bilaterally  8. Cardiovascular : RRR, no gallops, rubs or murmurs, no leg edema  9. Gastrointestinal:  Positive bowel sounds, abdomen soft, tender to palpation in the epigastric region,no hepatosplenomegaly, no rigidity or guarding       10. Skin:  No cyanosis, normal texture and turgor, no rash, lesions or ulcers  11.Musculoskeletal:  Good muscle tone,  joints appear normal , no effusions,  normal range of motion    Data Review:    CBC Recent Labs  Lab 05/19/17 1458  WBC 10.9*  HGB 13.1  HCT 39.8  PLT 339  MCV 91.5  MCH 30.1  MCHC 32.9  RDW 13.9    ------------------------------------------------------------------------------------------------------------------  Chemistries  Recent Labs  Lab 05/19/17 1458  NA 134*  K 3.2*  CL 98*  CO2 22  GLUCOSE 296*  BUN 15  CREATININE 0.95  CALCIUM 9.4  AST 17  ALT 16  ALKPHOS 101  BILITOT 0.7   ------------------------------------------------------------------------------------------------------------------  ------------------------------------------------------------------------------------------------------------------ GFR: Estimated Creatinine Clearance: 64.2 mL/min (by C-G formula based on SCr of 0.95 mg/dL). Liver Function Tests: Recent Labs  Lab 05/19/17 1458  AST 17  ALT 16  ALKPHOS 101  BILITOT 0.7  PROT 8.1  ALBUMIN 3.8   Recent Labs  Lab 05/19/17 1458  LIPASE 22   CBG: Recent Labs  Lab 05/19/17 1445 05/19/17 1820 05/19/17 2047  GLUCAP 292* 231* 254*    --------------------------------------------------------------------------------------------------------------- Urine analysis:    Component Value Date/Time   COLORURINE YELLOW 05/19/2017 1700   APPEARANCEUR CLEAR 05/19/2017 1700   LABSPEC 1.025 05/19/2017 1700   PHURINE 6.0 05/19/2017 1700   GLUCOSEU 100 (A) 05/19/2017 1700   HGBUR NEGATIVE 05/19/2017 1700   BILIRUBINUR NEGATIVE 05/19/2017 1700   BILIRUBINUR negative 05/20/2014 1513   KETONESUR NEGATIVE 05/19/2017 1700   PROTEINUR TRACE (A) 05/19/2017 1700   UROBILINOGEN 0.2 05/20/2014 1513   UROBILINOGEN 1.0 11/20/2012 2001   NITRITE POSITIVE (A) 05/19/2017 1700   LEUKOCYTESUR NEGATIVE 05/19/2017 1700      Imaging Results:    Ct Abdomen Pelvis W Contrast  Result Date: 05/19/2017 CLINICAL DATA:  Complaining of nausea, vomiting, diarrhea and abdominal pain x8 days. Fever chills. Pain is diffuse. EXAM: CT ABDOMEN AND PELVIS WITH CONTRAST TECHNIQUE: Multidetector CT imaging of the abdomen and pelvis was performed using the standard protocol  following bolus administration of intravenous contrast. CONTRAST:  175m ISOVUE-300 IOPAMIDOL (ISOVUE-300) INJECTION 61% COMPARISON:  08/14/2015 CT FINDINGS: Lower chest: Top-normal size heart without pericardial effusion. Minimal subsegmental atelectasis in the lingula. No effusion or pneumothorax. Hepatobiliary: Hepatomegaly with morphologic changes of cirrhosis. Subcapsular areas of hypodensity along the right and left hepatic lobes may reflect old remote subcapsular areas of trauma/hemorrhage or potentially infarcts. Subtle tubular areas of hypodensity in the right hepatic lobe may reflect chronic posttraumatic change//aerations versus biliary dilatation. No enhancing masses. Scattered subcentimeter rounded hypodensities are also noted statistically consistent with cysts or hemangiomata. Small foci of infection/abscess believed less likely. Status post cholecystectomy. Pancreas: Normal without ductal dilatation or mass. Spleen: Calcified granuloma in the spleen.  No splenomegaly Adrenals/Urinary Tract: Normal bilateral adrenal glands. Symmetric cortical enhancement of both kidneys without nephrolithiasis nor obstructive uropathy. Bladder is unremarkable for degree of distention. Stomach/Bowel: Status post Roux-en-Y gastric bypass. No dehiscence across the staple line. Stenotic appearing jejunojejunostomy site with more proximal dilatation of jejunum and fecalized material within. Status post appendectomy. Unremarkable colon. Vascular/Lymphatic: Mild aortoiliac atherosclerosis. No aneurysm. No adenopathy. Reproductive: Hysterectomy.  No adnexal mass. Other: No free air nor free fluid Musculoskeletal: No acute nor suspicious osseous abnormality. IMPRESSION: 1. 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 2. Morphologic appearance of cirrhosis involving the liver. New subcapsular areas  of hypodensity involving the right and left hepatic lobes may represent stigmata of old remote subcapsular  hematoma and trauma. Subtle linear lucencies also noted in the right hepatic lobe that may also reflect old posttraumatic change. The possibility of mild intrahepatic biliary dilatation is not entirely excluded. Scattered small hypodensities too small to further characterize are also seen scattered within the liver. Statistically these may represent small cysts or hemangiomata. Small foci of infection not entirely excluded but believed less likely. Nonemergent MRI may help for further correlation. Electronically Signed   By: Ashley Royalty M.D.   On: 05/19/2017 21:49       Assessment & Plan:    Active Problems:   Hypokalemia Nausea vomiting and diarrhea UTI  1. 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  We will keep patient n.p.o.  Zofran as needed for nausea and vomiting.  Morphine as needed for pain.  Dr. Arnoldo Morale will see the patient in a.m. 2. Hypokalemia-potassium is 3.2, likely from ongoing vomiting and diarrhea.  Will replace potassium and check BMP in a.m. 3. Diabetes mellitus-hold Levemir, start sliding scale insulin with NovoLog.  Check CBG every 6 hours. 4. UTI-patient has abnormal UA with positive nitrite, start ceftriaxone per pharmacy.  Will obtain urine culture 5. Diarrhea-we will obtain GI pathogen panel.   DVT Prophylaxis-   Lovenox   AM Labs Ordered, also please review Full Orders  Family Communication: Admission, patients condition and plan of care including tests being ordered have been discussed with the patient  who indicate understanding and agree with the plan and Code Status.  Code Status: Full code  Admission status: Inpatient  Time spent in minutes : 60 minutes   Oswald Hillock M.D on 05/19/2017 at 11:13 PM  Between 7am to 7pm - Pager - [phone].  After 7pm go to [email or web address] - password [facility]  [facility] - Office  [phone]

## 2017-05-20 ENCOUNTER — Encounter (HOSPITAL_COMMUNITY): Payer: Self-pay

## 2017-05-20 DIAGNOSIS — R932 Abnormal findings on diagnostic imaging of liver and biliary tract: Secondary | ICD-10-CM

## 2017-05-20 DIAGNOSIS — F17200 Nicotine dependence, unspecified, uncomplicated: Secondary | ICD-10-CM

## 2017-05-20 DIAGNOSIS — F3181 Bipolar II disorder: Secondary | ICD-10-CM

## 2017-05-20 DIAGNOSIS — R197 Diarrhea, unspecified: Secondary | ICD-10-CM

## 2017-05-20 DIAGNOSIS — K909 Intestinal malabsorption, unspecified: Secondary | ICD-10-CM

## 2017-05-20 DIAGNOSIS — R112 Nausea with vomiting, unspecified: Secondary | ICD-10-CM | POA: Diagnosis present

## 2017-05-20 DIAGNOSIS — Z9884 Bariatric surgery status: Secondary | ICD-10-CM

## 2017-05-20 DIAGNOSIS — E1165 Type 2 diabetes mellitus with hyperglycemia: Secondary | ICD-10-CM

## 2017-05-20 HISTORY — DX: Abnormal findings on diagnostic imaging of liver and biliary tract: R93.2

## 2017-05-20 HISTORY — DX: Bariatric surgery status: Z98.84

## 2017-05-20 LAB — COMPREHENSIVE METABOLIC PANEL
ALT: 15 U/L (ref 14–54)
AST: 16 U/L (ref 15–41)
Albumin: 3.4 g/dL — ABNORMAL LOW (ref 3.5–5.0)
Alkaline Phosphatase: 90 U/L (ref 38–126)
Anion gap: 11 (ref 5–15)
BILIRUBIN TOTAL: 0.4 mg/dL (ref 0.3–1.2)
BUN: 13 mg/dL (ref 6–20)
CHLORIDE: 101 mmol/L (ref 101–111)
CO2: 25 mmol/L (ref 22–32)
CREATININE: 0.87 mg/dL (ref 0.44–1.00)
Calcium: 8.9 mg/dL (ref 8.9–10.3)
Glucose, Bld: 298 mg/dL — ABNORMAL HIGH (ref 65–99)
POTASSIUM: 3.3 mmol/L — AB (ref 3.5–5.1)
Sodium: 137 mmol/L (ref 135–145)
TOTAL PROTEIN: 7.3 g/dL (ref 6.5–8.1)

## 2017-05-20 LAB — CBC
HEMATOCRIT: 37.7 % (ref 36.0–46.0)
Hemoglobin: 12.3 g/dL (ref 12.0–15.0)
MCH: 30.2 pg (ref 26.0–34.0)
MCHC: 32.6 g/dL (ref 30.0–36.0)
MCV: 92.6 fL (ref 78.0–100.0)
PLATELETS: 330 10*3/uL (ref 150–400)
RBC: 4.07 MIL/uL (ref 3.87–5.11)
RDW: 13.9 % (ref 11.5–15.5)
WBC: 8.6 10*3/uL (ref 4.0–10.5)

## 2017-05-20 LAB — GASTROINTESTINAL PANEL BY PCR, STOOL (REPLACES STOOL CULTURE)
ADENOVIRUS F40/41: NOT DETECTED
ASTROVIRUS: NOT DETECTED
Campylobacter species: NOT DETECTED
Cryptosporidium: NOT DETECTED
Cyclospora cayetanensis: NOT DETECTED
ENTAMOEBA HISTOLYTICA: NOT DETECTED
ENTEROAGGREGATIVE E COLI (EAEC): NOT DETECTED
ENTEROPATHOGENIC E COLI (EPEC): NOT DETECTED
ENTEROTOXIGENIC E COLI (ETEC): NOT DETECTED
GIARDIA LAMBLIA: NOT DETECTED
NOROVIRUS GI/GII: NOT DETECTED
Plesimonas shigelloides: NOT DETECTED
Rotavirus A: NOT DETECTED
SHIGA LIKE TOXIN PRODUCING E COLI (STEC): NOT DETECTED
Salmonella species: NOT DETECTED
Sapovirus (I, II, IV, and V): NOT DETECTED
Shigella/Enteroinvasive E coli (EIEC): NOT DETECTED
Vibrio cholerae: NOT DETECTED
Vibrio species: NOT DETECTED
Yersinia enterocolitica: NOT DETECTED

## 2017-05-20 LAB — GLUCOSE, CAPILLARY
GLUCOSE-CAPILLARY: 301 mg/dL — AB (ref 65–99)
GLUCOSE-CAPILLARY: 277 mg/dL — AB (ref 65–99)
GLUCOSE-CAPILLARY: 79 mg/dL (ref 65–99)
GLUCOSE-CAPILLARY: 151 mg/dL — AB (ref 65–99)
Glucose-Capillary: 225 mg/dL — ABNORMAL HIGH (ref 65–99)

## 2017-05-20 LAB — HEMOGLOBIN A1C
HEMOGLOBIN A1C: 11 % — AB (ref 4.8–5.6)
MEAN PLASMA GLUCOSE: 269 mg/dL

## 2017-05-20 MED ORDER — INSULIN DETEMIR 100 UNIT/ML ~~LOC~~ SOLN
25.0000 [IU] | Freq: Two times a day (BID) | SUBCUTANEOUS | Status: DC
  Administered 2017-05-20 – 2017-05-21 (×2): 25 [IU] via SUBCUTANEOUS
  Filled 2017-05-20 (×5): qty 0.25

## 2017-05-20 MED ORDER — SODIUM CHLORIDE 0.9 % IV SOLN
1.0000 g | INTRAVENOUS | Status: DC
  Administered 2017-05-21: 1 g via INTRAVENOUS
  Filled 2017-05-20: qty 1
  Filled 2017-05-20: qty 10

## 2017-05-20 MED ORDER — MORPHINE SULFATE (PF) 2 MG/ML IV SOLN
2.0000 mg | INTRAVENOUS | Status: DC | PRN
  Administered 2017-05-20 – 2017-05-21 (×10): 2 mg via INTRAVENOUS
  Filled 2017-05-20 (×11): qty 1

## 2017-05-20 MED ORDER — DIPHENHYDRAMINE HCL 25 MG PO CAPS: 25.0000 mg | ORAL_CAPSULE | ORAL | Status: DC | PRN

## 2017-05-20 MED ORDER — ONDANSETRON HCL 4 MG PO TABS
4.0000 mg | ORAL_TABLET | Freq: Four times a day (QID) | ORAL | Status: DC | PRN
  Administered 2017-05-20 – 2017-05-21 (×2): 4 mg via ORAL
  Filled 2017-05-20 (×2): qty 1

## 2017-05-20 MED ORDER — POTASSIUM CHLORIDE 10 MEQ/100ML IV SOLN
10.0000 meq | INTRAVENOUS | Status: AC
  Administered 2017-05-20 (×2): 10 meq via INTRAVENOUS
  Filled 2017-05-20 (×3): qty 100

## 2017-05-20 MED ORDER — SODIUM CHLORIDE 0.9 % IV SOLN
INTRAVENOUS | Status: DC
  Administered 2017-05-20: 04:00:00 via INTRAVENOUS

## 2017-05-20 MED ORDER — SODIUM CHLORIDE 0.9 % IV SOLN
2.0000 g | Freq: Once | INTRAVENOUS | Status: AC
  Administered 2017-05-20: 2 g via INTRAVENOUS
  Filled 2017-05-20 (×2): qty 20

## 2017-05-20 MED ORDER — INSULIN ASPART 100 UNIT/ML ~~LOC~~ SOLN
0.0000 [IU] | SUBCUTANEOUS | Status: DC
  Administered 2017-05-20: 7 [IU] via SUBCUTANEOUS
  Administered 2017-05-20: 11 [IU] via SUBCUTANEOUS
  Administered 2017-05-21: 3 [IU] via SUBCUTANEOUS
  Administered 2017-05-21: 7 [IU] via SUBCUTANEOUS

## 2017-05-20 MED ORDER — DIPHENHYDRAMINE HCL 50 MG/ML IJ SOLN
25.0000 mg | Freq: Once | INTRAMUSCULAR | Status: AC
  Administered 2017-05-20: 25 mg via INTRAVENOUS
  Filled 2017-05-20: qty 1

## 2017-05-20 MED ORDER — POTASSIUM CHLORIDE IN NACL 40-0.9 MEQ/L-% IV SOLN
INTRAVENOUS | Status: DC
  Administered 2017-05-20 – 2017-05-21 (×2): 100 mL/h via INTRAVENOUS

## 2017-05-20 MED ORDER — ENOXAPARIN SODIUM 40 MG/0.4ML ~~LOC~~ SOLN
40.0000 mg | SUBCUTANEOUS | Status: DC
  Administered 2017-05-20 – 2017-05-22 (×3): 40 mg via SUBCUTANEOUS
  Filled 2017-05-20 (×3): qty 0.4

## 2017-05-20 MED ORDER — INSULIN ASPART 100 UNIT/ML ~~LOC~~ SOLN: 0.0000 [IU] | Freq: Three times a day (TID) | SUBCUTANEOUS | Status: DC

## 2017-05-20 MED ORDER — DIPHENHYDRAMINE HCL 12.5 MG/5ML PO ELIX
25.0000 mg | ORAL_SOLUTION | Freq: Four times a day (QID) | ORAL | Status: DC | PRN
  Administered 2017-05-20 – 2017-05-21 (×7): 25 mg via ORAL
  Filled 2017-05-20 (×7): qty 10

## 2017-05-20 MED ORDER — ONDANSETRON HCL 4 MG/2ML IJ SOLN
4.0000 mg | Freq: Four times a day (QID) | INTRAMUSCULAR | Status: DC | PRN
  Administered 2017-05-20 – 2017-05-21 (×5): 4 mg via INTRAVENOUS
  Filled 2017-05-20 (×5): qty 2

## 2017-05-20 NOTE — Progress Notes (Signed)
[place] for ceftraixone Indication: UTI  Allergies  Allergen Reactions  . Aspirin Other (See Comments)    Gastric Bypass  . Latex Other (See Comments)    blisters  . Penicillins     UNSPECIFIED REACTION >"Took large quantities as a child & told to never take again Has patient had a PCN reaction causing immediate rash, facial/tongue/throat swelling, SOB or lightheadedness with hypotension: No Has patient had a PCN reaction causing severe rash involving mucus membranes or skin necrosis: No Has patient had a PCN reaction that required hospitalization: No Has patient had a PCN reaction occurring within the last 10 years: No If all of the above answers are "NO", then may proceed wi  . Codeine Itching   Patient Measurements: Height: 5' 4"  (162.6 cm) Weight: 175 lb (79.4 kg) IBW/kg (Calculated) : 54.7  Vital Signs: Temp: 98.6 F (37 C) (04/09 0614) Temp Source: Oral (04/09 0614) BP: 107/54 (04/09 0017) Pulse Rate: 93 (04/09 0614)  Labs: Recent Labs    05/19/17 1458 05/20/17 0546  WBC 10.9* 8.6  HGB 13.1 12.3  PLT 339 330  CREATININE 0.95 0.87   Estimated Creatinine Clearance: 70.1 mL/min (by C-G formula based on SCr of 0.87 mg/dL).  No results for input(s): VANCOTROUGH, VANCOPEAK, VANCORANDOM, GENTTROUGH, GENTPEAK, GENTRANDOM, TOBRATROUGH, TOBRAPEAK, TOBRARND, AMIKACINPEAK, AMIKACINTROU, AMIKACIN in the last 72 hours.   Microbiology: No results found for this or any previous visit (from the past 720 hour(s)).  Medical History: Past Medical History:  Diagnosis Date  . Abnormal liver diagnostic imaging 05/20/2017  . Abnormal mammogram 12/2014   F/u diagnostic mammo and u/s NORMAL--resume annual screening mammography  . Arthritis   . Bipolar disorder ([place])    questionable  . Cervical radiculopathy    cervical spondylosis on 2013 MRI.  Dr. 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 Trenton Gammon then did posterior cervical fusion.  . Chronic fatigue   . Chronic nausea 2012/13   Normal upper GI  12/2011 (mild GERD noted +small hiatal hernia)+  . Chronic pain syndrome    Chronic C spine pain with radiculopathy.  [facility] Francesco [facility]ner at [facility] was her pain mgmt MD and he discharged her from [facility] mgmt center b/c she failed a UDS (+marijuana) 04/27/15  . Chronic renal insufficiency, stage II (mild) 02/2015   GFR 70s  . Colon cancer screening 03/06/2011   iFob 11/2010 through her insurer was negative. iFOB 01/2015 NEG.  . Depression   . Diabetes mellitus    urine protein-Cr ratio nl 09/2010, D.R. Screen neg 10/2009  . Fibromyalgia   . Fracture of rib of left side 08/14/15   5th and 6th  . Hepatic steatosis 2005   ultrasound  . History of kidney stones   . 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 Hyperlipidemia, mixed 2017   Waiting to start statin (trying to take one thing at a time with her, working on glucose control first)  . Insomnia   . Iron deficiency anemia 09/2009   malabsorbtion s/p bariatric surgery  . Nephrolithiasis 11/2012  . Neurosis, anxiety, panic type   . Obesity    s/p bariatric surgery  . Osteopenia 12/2010   Hip T score -2.0.  Spine T score -0.5 (plan to repeat in 2 yrs)  . Other B-complex deficiencies   . Panic attacks   . PONV (postoperative nausea and vomiting)   . Restless  legs syndrome   . Syncopal episodes    7/73/17 in the setting of dehydration and sedative medication  . Tobacco dependence   . Vitamin D deficiency 09/2009  . Wears dentures    uppers and lowers  . Wears glasses    Medications:  Scheduled:  . enoxaparin (LOVENOX) injection  40 mg Subcutaneous Q24H  . insulin aspart  0-20 Units Subcutaneous Q4H  . insulin detemir  25 Units Subcutaneous BID   Infusions:  . 0.9 % NaCl with KCl 40 mEq / L    . [START ON 05/21/2017] cefTRIAXone (ROCEPHIN)  IV     PRN: diphenhydrAMINE, morphine injection,  ondansetron **OR** ondansetron (ZOFRAN) IV Anti-infectives (From admission, onward)   Start     Dose/Rate Route Frequency Ordered Stop   05/21/17 0600  cefTRIAXone (ROCEPHIN) 1 g in sodium chloride 0.9 % 100 mL IVPB     1 g 200 mL/hr over 30 Minutes Intravenous Every 24 hours 05/20/17 0915     05/20/17 0345  cefTRIAXone (ROCEPHIN) 2 g in sodium chloride 0.9 % 100 mL IVPB     2 g 200 mL/hr over 30 Minutes Intravenous  Once 05/20/17 0342 05/20/17 7510     Assessment: 62 yo female with abnormal UA with positive nitrite, starting ceftriaxone.  PCN allergy noted, remote hx and pt tolerated initial dose of Rocephin.   Plan:  Rocephin 1gm IV q24hrs Monitor labs, progress, c/s  Hart Robinsons A, RPH 05/20/2017,9:17 AM

## 2017-05-20 NOTE — Progress Notes (Addendum)
Initial Nutrition Assessment  DOCUMENTATION CODES:  Obesity unspecified  INTERVENTION:  Recommend checking  Vit D (not taking rec amount) and B12-patient says she stopped taking her injections ~ 1 year ago due to cost- not taking any po supplements  Reviewed dietary supplement guidelines for RYGB -Once able to take medications by mouth, recommend 2x mvi with min, 350-500 mcg sublingual b12 and 1500 mg calcium w. 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  GOAL:  Patient will meet greater than or equal to 90% of their needs  MONITOR:  PO intake, Supplement acceptance, Diet advancement, Labs, Weight trends, I & O's, Plan of care  REASON FOR ASSESSMENT:  Malnutrition Screening Tool    ASSESSMENT:  62 y/o female PMhx Roux-en-y Gastric Bypass, Cervical spine surgery, Depression, DM2, CKD2, Chronic pain syndrome. Presents w/ vomiting, retching, diarrhea x9 days w/ associated w/ abdominal pain. CT scan shows stenosis at jejuno-jejunostomy anastomosis w/ fecal lysed material within jejunal limb leading to staple line. Admitted for management and general surgery consult.   Spent considerable time talking with patient and spouse. Pt quickly voices how she is hungry. sHE REMAINS NPO.They are currently still waiting to see surgeon regarding plan of care.   Pt states her symptoms began 9 days ago .Since that time, spouse/pt report that every time the patient eats, "it goes right through" her. Also would have nausea/retching. The last time the patient ate was 2 pieces of toast, 2 days PTA, which caused diarrhea 30 min later..    Regarding her baseline and history of RYGB, patient reports having surgery in 1991. She says she was not obese, but had "diabetes and high cholesterol that were competing with each other". She says that because the surgery was not intended for weight loss per se, the surgeon modified the typical RYGB surgery and "made my stomach larger than normal" so that she would not be as likely to lose large amounts of weight.   Vitamin wise, she currently takes calcium w/ Vit D, but says she only takes 1x/day. Unsure of dosage. She had taken 2 mvis/day at one point, but notes they started 'not going through" and she couldn't tolerate the large pills. She also says the iron in them made her severely constipated. It does not sound like these were the chewable versions. Finally, for b12, she had been receving injections, but says she stopped ~ 1 year ago due to the high cost. RD discussed sublingual option. They were unaware this was possible and stated they would look into it.   Regarding changes in eating behavior, she says since surgery she has noted she gets full much quicker. She also reports intermittent, varied intolerances. Oddly enough, she says she can tolerate vegetables, such as lettuce and salads, but starches "get stuck". She also has a very hard time tolerating meats. THey must be very finely cut. She notes food gets "stuck in the hole" at 50% of her meals. When this occurs, she will feel pain/nausea. Once remedy to this is to drink meat tenderizer w/ water. She says her surgeon told her to do this and reports effectiveness with this. RD asked  about diarrhea or other s/s dumping syndrome. Patient reports chronic constipation and Spouse notes that "most of the time, the food comes back up, rather than cause any loose bowels".   Wt wise, the patients reports being 191 lbs in February at her bariatric follow up. Now, the pt says she weighed herself 2 days ago and was 175 lbs. She asked to get a weight in the  hospital, but has not yet been done. Wt today on bed was ~82 kg, but patient doesn't believe this is accurate. She may have bene dehydrated when weighed at home and has since been rehydrated  Performed micronutrient exam. Nails and eyes WDL. Subjectively, she has fairly significant pallor, but Hgb in normal limits-may just be how presents (spouse notes she is chronically ill and rarely leaves house). May be due to minimal sun exposure-She notes her fair does fall out fairly easily. Nails appear WDL, negative for koilonychia   During our discussion, RD gave recommendations regarding protein intake. Recommended supplement since she has trouble tolerating meats.   At this time, she says her diarrhea is imrpoved, her last stool was "loose with brown/sandy crystals in it"?Marland Kitchen She wants to eat. She was agreeable to supplements on diet advancement.   Physical Exam: Mild orbital fat wasting. Moderate muscle wasting of temporalis. Spouse notes atrophy from not being as mobile due to chronic pain syndrome.    Meds: PRN  morphine, benadryl, Zofran, insulin Labs: A1C 4/8:11.0, BG since admit: 225-300, Albumin: 3.4,   Recent Labs  Lab 05/19/17 1458 05/20/17 0546  NA 134* 137  K 3.2* 3.3*  CL 98* 101  CO2 22 25  BUN 15 13  CREATININE 0.95 0.87  CALCIUM 9.4 8.9  GLUCOSE 296* 298*    NUTRITION - FOCUSED PHYSICAL EXAM:   Most Recent Value  Orbital Region  Mild depletion  Upper Arm Region  No depletion  Thoracic and Lumbar Region  No depletion  Buccal Region  No depletion  Temple Region  Moderate depletion  Clavicle Bone Region  No depletion  Clavicle and Acromion Bone Region  No depletion  Scapular Bone Region  No depletion  Patellar Region  No depletion  Anterior Thigh Region  No depletion  Posterior Calf Region  No depletion  Hair  Reviewed  Eyes  Reviewed  Mouth  Reviewed  Skin  Reviewed  Nails  Reviewed     Diet Order:  Diet NPO time specified  EDUCATION NEEDS:  Education needs  have been addressed  Skin:  Skin Assessment: Reviewed RN Assessment  Last BM:  4/9  Height:  Ht Readings from Last 1 Encounters:  05/19/17 5' 4"  (1.626 m)   Weight:  Wt Readings from Last 1 Encounters:  05/19/17 175 lb (79.4 kg)   Wt Readings from Last 10 Encounters:  05/19/17 175 lb (79.4 kg)  12/31/16 184 lb (83.5 kg)  12/26/16 184 lb 12.8 oz (83.8 kg)  10/11/16 183 lb 1.9 oz (83.1 kg)  03/22/16 178 lb 4 oz (80.9 kg)  02/02/16 181 lb 12 oz (82.4 kg)  12/22/15 180 lb 12.8 oz (82 kg)  11/10/15 175 lb 12.8 oz (79.7 kg)  09/29/15 171 lb 8 oz (77.8 kg)  08/29/15 170 lb 4 oz (77.2 kg)   Ideal Body Weight:  54.54 kg  BMI:  Body mass index is 30.04 kg/m.  Estimated Nutritional Needs:  Kcal:  1650-1850 kcals (21-23 kcal/kg bw) Protein:  80-95 g Pro (1.5-1.7g/kg ibw) Fluid:  1.7-1.9  L per day (1  ml/kcal)  Burtis Junes RD, LDN, CNSC Clinical Nutrition Available Tues-Sat via Pager: [phone] 05/20/2017 3:01 PM

## 2017-05-20 NOTE — Progress Notes (Signed)
ANTIBIOTIC CONSULT NOTE-Preliminary  Pharmacy Consult for ceftraixone Indication: UTI  Allergies  Allergen Reactions  . Aspirin Other (See Comments)    Gastric Bypass  . Latex Other (See Comments)    blisters  . Penicillins     UNSPECIFIED REACTION >"Took large quantities as a child & told to never take again Has patient had a PCN reaction causing immediate rash, facial/tongue/throat swelling, SOB or lightheadedness with hypotension: No Has patient had a PCN reaction causing severe rash involving mucus membranes or skin necrosis: No Has patient had a PCN reaction that required hospitalization: No Has patient had a PCN reaction occurring within the last 10 years: No If all of the above answers are "NO", then may proceed wi  . Codeine Itching    Patient Measurements: Height: 5' 4"  (162.6 cm) Weight: 175 lb (79.4 kg) IBW/kg (Calculated) : 54.7 Adjusted Body Weight:   Vital Signs: Temp: 98.8 F (37.1 C) (04/08 1821) Temp Source: Oral (04/08 1821) BP: 113/63 (04/09 0300) Pulse Rate: 97 (04/09 0300)  Labs: Recent Labs    05/19/17 1458  WBC 10.9*  HGB 13.1  PLT 339  CREATININE 0.95    Estimated Creatinine Clearance: 64.2 mL/min (by C-G formula based on SCr of 0.95 mg/dL).  No results for input(s): VANCOTROUGH, VANCOPEAK, VANCORANDOM, GENTTROUGH, GENTPEAK, GENTRANDOM, TOBRATROUGH, TOBRAPEAK, TOBRARND, AMIKACINPEAK, AMIKACINTROU, AMIKACIN in the last 72 hours.   Microbiology: No results found for this or any previous visit (from the past 720 hour(s)).  Medical History: Past Medical History:  Diagnosis Date  . Abnormal mammogram 12/2014   F/u diagnostic mammo and u/s NORMAL--resume annual screening mammography  . Arthritis   . Bipolar disorder ([place])    questionable  . Cervical radiculopathy    cervical spondylosis on 2013 MRI.  Dr. 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  Trenton Gammon then did posterior cervical fusion.  . Chronic fatigue   . Chronic nausea 2012/13   Normal upper GI  12/2011 (mild GERD noted +small hiatal hernia)+  . Chronic pain syndrome    Chronic C spine pain with radiculopathy.  Dr. Francesco Runner at [facility] was her pain mgmt MD and he discharged her from [facility] mgmt [facility] b/c she failed a UDS (+marijuana) 04/27/15  . Chronic renal insufficiency, stage II (mild) 02/2015   GFR 70s  . Colon cancer screening 03/06/2011   iFob 11/2010 through her insurer was negative. iFOB 01/2015 NEG.  . Depression   . Diabetes mellitus    urine protein-Cr ratio nl 09/2010, D.R. Screen neg 10/2009  . Fibromyalgia   . Fracture of rib of left side 08/14/15   5th and 6th  . Hepatic steatosis 2005   ultrasound  . History of kidney stones   . Hyperlipidemia, mixed 2017   Waiting to start statin (trying to take one thing at a time with her, working on glucose control first)  . Insomnia   . Iron deficiency anemia 09/2009   malabsorbtion s/p bariatric surgery  . Nephrolithiasis 11/2012  . Neurosis, anxiety, panic type   . Obesity    s/p bariatric surgery  . Osteopenia 12/2010   Hip T score -2.0.  Spine T score -0.5 (plan to repeat in 2 yrs)  . Other B-complex deficiencies   . Panic attacks   . PONV (postoperative nausea and vomiting)   . Restless legs syndrome   . Syncopal episodes    7/73/17 in the setting of  dehydration and sedative medication  . Tobacco dependence   . Vitamin D deficiency 09/2009  . Wears dentures    uppers and lowers  . Wears glasses     Medications:  Scheduled:  . enoxaparin (LOVENOX) injection  40 mg Subcutaneous Q24H  . insulin aspart  0-9 Units Subcutaneous TID WC   Infusions:  . sodium chloride 100 mL/hr at 05/20/17 0344  . cefTRIAXone (ROCEPHIN)  IV    . potassium chloride 10 mEq (05/20/17 0412)   PRN: morphine injection, ondansetron **OR** ondansetron (ZOFRAN) IV Anti-infectives (From admission, onward)   Start     Dose/Rate  Route Frequency Ordered Stop   05/20/17 0345  cefTRIAXone (ROCEPHIN) 2 g in sodium chloride 0.9 % 100 mL IVPB     2 g 200 mL/hr over 30 Minutes Intravenous  Once 05/20/17 0342        Assessment: 62 yo female with abnormal UA with positive nitrite, starting ceftriaxone.    Plan:  Preliminary review of pertinent patient information completed.  Protocol will be initiated with dose(s) of ceftriaxone 2 grams x 1.  Forestine Na clinical pharmacist will complete review during morning rounds to assess patient and finalize treatment regimen if needed.  Nyra Capes, [place] 05/20/2017,4:16 AM

## 2017-05-20 NOTE — Progress Notes (Signed)
Progress Note    Diana [name]  BSW:[id number] DOB: 1955/11/06  DOA: 05/19/2017 PCP: Tammi Sou, MD    Brief Narrative:   Chief complaint: Follow-up nausea, vomiting, diarrhea.  Medical records reviewed and are as summarized below:  Diana [name] is an 62 y.o. female with a PMH of Roux-en-Y gastric bypass, cervical spine surgery, diabetes, and restless leg syndrome who was admitted 05/19/17 for evaluation of a one-week history of nausea, vomiting and diarrhea.  Upon initial evaluation in the ED, CT scan of the abdomen and pelvis showed stenosis/stricture suspected at the jejunal jejunostomy site of the Roux-en-Y with fecallysed material within the jejunal limb.  Surgery subsequently consulted.  Assessment/Plan:   Principal Problem:   Nausea vomiting and diarrhea in a patient with a history of Roux-en-Y gastric bypass CT scan personally reviewed. The patient appears to have a stricture or stenosis at the jejunojejunostomy site as well as abnormal imaging of the liver.  Surgical consultation pending.  Continue bowel rest and IV fluids.  Active Problems:   Cirrhosis/abnormal imaging of the liver Patient will need an outpatient nonemergent MRI for further evaluation.    Diabetes type 2, uncontrolled ([place]) Managed with Levemir 70 units at at bedtime and 18 units of NovoLog before meals at home.  Was only placed on SSI, sensitive scale, 3 times daily on admission.  CBG is 231-301.  Will place on Levemir 25 units twice daily and change SSI to insulin resistant scale every 4 hours while n.p.o.    Bipolar II disorder ([place]) Appears to be managed with Cymbalta, Lamictal, Remeron, Ativan.  Oral medications currently on hold.    Malabsorption syndrome Monitor and replace electrolytes as needed.    Tobacco dependence Tobacco cessation counseling.    Hypokalemia We will add potassium to her IV fluids.    Polypharmacy It appears that the patient is on Adderall, Neurontin,  Cymbalta, hydrocodone, Lamictal, Ativan, Remeron, Zanaflex at home.    Obesity Body mass index is 30.04 kg/m.   Family Communication/Anticipated D/C date and plan/Code Status   DVT prophylaxis: Lovenox ordered. Code Status: Full Code.  Family Communication: Husband updated at the bedside. Disposition Plan: Home when surgical issue resolved.   Medical Consultants:    Surgery   Anti-Infectives:    None  Subjective:   [name] continues to report LUQ pain. Has nausea but no vomiting over night. Still with diarrhea.   Objective:    Vitals:   05/19/17 2330 05/20/17 0000 05/20/17 0300 05/20/17 0614  BP: (!) 116/58 (!) 116/55 113/63 (!) 107/54  Pulse: 100 100 97 93  Resp: 16 16 16    Temp:    98.6 F (37 C)  TempSrc:    Oral  SpO2: 92% 92% 93% 95%  Weight:      Height:        Intake/Output Summary (Last 24 hours) at 05/20/2017 0757 Last data filed at 05/19/2017 2148 Gross per 24 hour  Intake 1000 ml  Output -  Net 1000 ml   Filed Weights   05/19/17 1440  Weight: 79.4 kg (175 lb)    Exam: General: Obese female who is in no acute distress. Cardiovascular: Heart sounds show a regular rate, and rhythm. No gallops or rubs. No murmurs. No JVD. Lungs: Clear to auscultation bilaterally with good air movement. No rales, rhonchi or wheezes. Abdomen: Tenderness LUQ and mildly distended abdomen. No masses. No hepatosplenomegaly. Neurological: Alert and oriented 3. Moves all extremities 4 with equal strength.  Cranial nerves II through XII grossly intact. Skin: Warm and dry. No rashes or lesions. Extremities: No clubbing or cyanosis. No edema. Pedal pulses 2+. Psychiatric: Mood and affect are normal. Insight and judgment are fair.   Data Reviewed:   I have personally reviewed following labs and imaging studies:  Labs: Labs show the following:   Basic Metabolic Panel: Recent Labs  Lab 05/19/17 1458 05/20/17 0546  NA 134* 137  K 3.2* 3.3*  CL 98* 101  CO2  22 25  GLUCOSE 296* 298*  BUN 15 13  CREATININE 0.95 0.87  CALCIUM 9.4 8.9   GFR Estimated Creatinine Clearance: 70.1 mL/min (by C-G formula based on SCr of 0.87 mg/dL). Liver Function Tests: Recent Labs  Lab 05/19/17 1458 05/20/17 0546  AST 17 16  ALT 16 15  ALKPHOS 101 90  BILITOT 0.7 0.4  PROT 8.1 7.3  ALBUMIN 3.8 3.4*   Recent Labs  Lab 05/19/17 1458  LIPASE 22   CBC: Recent Labs  Lab 05/19/17 1458 05/20/17 0546  WBC 10.9* 8.6  HGB 13.1 12.3  HCT 39.8 37.7  MCV 91.5 92.6  PLT 339 330   CBG: Recent Labs  Lab 05/19/17 1445 05/19/17 1820 05/19/17 2047 05/20/17 0615  GLUCAP 292* 231* 254* 301*    Microbiology No results found for this or any previous visit (from the past 240 hour(s)).  Procedures and diagnostic studies:  Ct Abdomen Pelvis W Contrast  Result Date: 05/19/2017 CLINICAL DATA:  Complaining of nausea, vomiting, diarrhea and abdominal pain x8 days. Fever chills. Pain is diffuse. EXAM: CT ABDOMEN AND PELVIS WITH CONTRAST TECHNIQUE: Multidetector CT imaging of the abdomen and pelvis was performed using the standard protocol following bolus administration of intravenous contrast. CONTRAST:  13m ISOVUE-300 IOPAMIDOL (ISOVUE-300) INJECTION 61% COMPARISON:  08/14/2015 CT FINDINGS: Lower chest: Top-normal size heart without pericardial effusion. Minimal subsegmental atelectasis in the lingula. No effusion or pneumothorax. Hepatobiliary: Hepatomegaly with morphologic changes of cirrhosis. Subcapsular areas of hypodensity along the right and left hepatic lobes may reflect old remote subcapsular areas of trauma/hemorrhage or potentially infarcts. Subtle tubular areas of hypodensity in the right hepatic lobe may reflect chronic posttraumatic change//aerations versus biliary dilatation. No enhancing masses. Scattered subcentimeter rounded hypodensities are also noted statistically consistent with cysts or hemangiomata. Small foci of infection/abscess believed less  likely. Status post cholecystectomy. Pancreas: Normal without ductal dilatation or mass. Spleen: Calcified granuloma in the spleen.  No splenomegaly Adrenals/Urinary Tract: Normal bilateral adrenal glands. Symmetric cortical enhancement of both kidneys without nephrolithiasis nor obstructive uropathy. Bladder is unremarkable for degree of distention. Stomach/Bowel: Status post Roux-en-Y gastric bypass. No dehiscence across the staple line. Stenotic appearing jejunojejunostomy site with more proximal dilatation of jejunum and fecalized material within. Status post appendectomy. Unremarkable colon. Vascular/Lymphatic: Mild aortoiliac atherosclerosis. No aneurysm. No adenopathy. Reproductive: Hysterectomy.  No adnexal mass. Other: No free air nor free fluid Musculoskeletal: No acute nor suspicious osseous abnormality. IMPRESSION: 1. 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 2. Morphologic appearance of cirrhosis involving the liver. New subcapsular areas of hypodensity involving the right and left hepatic lobes may represent stigmata of old remote subcapsular hematoma and trauma. Subtle linear lucencies also noted in the right hepatic lobe that may also reflect old posttraumatic change. The possibility of mild intrahepatic biliary dilatation is not entirely excluded. Scattered small hypodensities too small to further characterize are also seen scattered  within the liver. Statistically these may represent small cysts or hemangiomata. Small foci of infection not entirely excluded but believed less likely. Nonemergent MRI may help for further correlation. Electronically Signed   By: Ashley Royalty M.D.   On: 05/19/2017 21:49    Medications:   . enoxaparin (LOVENOX) injection  40 mg Subcutaneous Q24H  . insulin aspart  0-9 Units Subcutaneous TID WC   Continuous Infusions: . sodium  chloride 100 mL/hr at 05/20/17 0344     LOS: 1 day   Jacquelynn Cree  [facility] Pager [phone]. If unable to reach me by pager, please call my cell phone at [phone].  *Please refer to [email or web address], password [email or web address] to get updated schedule on who will round on this patient, as hospitalists switch teams weekly. If 7PM-7AM, please contact night-coverage at [email or web address], password [email or web address] for any overnight needs.  05/20/2017, 7:57 AM

## 2017-05-21 DIAGNOSIS — R197 Diarrhea, unspecified: Secondary | ICD-10-CM

## 2017-05-21 DIAGNOSIS — R112 Nausea with vomiting, unspecified: Secondary | ICD-10-CM

## 2017-05-21 LAB — GLUCOSE, CAPILLARY
GLUCOSE-CAPILLARY: 217 mg/dL — AB (ref 65–99)
GLUCOSE-CAPILLARY: 93 mg/dL (ref 65–99)
Glucose-Capillary: 115 mg/dL — ABNORMAL HIGH (ref 65–99)
Glucose-Capillary: 121 mg/dL — ABNORMAL HIGH (ref 65–99)
Glucose-Capillary: 134 mg/dL — ABNORMAL HIGH (ref 65–99)
Glucose-Capillary: 165 mg/dL — ABNORMAL HIGH (ref 65–99)
Glucose-Capillary: 167 mg/dL — ABNORMAL HIGH (ref 65–99)

## 2017-05-21 MED ORDER — DULOXETINE HCL 30 MG PO CPEP
30.0000 mg | ORAL_CAPSULE | Freq: Two times a day (BID) | ORAL | Status: DC
  Administered 2017-05-21 – 2017-05-22 (×3): 30 mg via ORAL
  Filled 2017-05-21 (×3): qty 1

## 2017-05-21 MED ORDER — LACTATED RINGERS IV SOLN
INTRAVENOUS | Status: AC
  Administered 2017-05-21: 14:00:00 via INTRAVENOUS

## 2017-05-21 MED ORDER — INSULIN ASPART 100 UNIT/ML ~~LOC~~ SOLN
0.0000 [IU] | Freq: Three times a day (TID) | SUBCUTANEOUS | Status: DC
  Administered 2017-05-22: 3 [IU] via SUBCUTANEOUS

## 2017-05-21 MED ORDER — GABAPENTIN 300 MG PO CAPS
600.0000 mg | ORAL_CAPSULE | Freq: Every day | ORAL | Status: DC
  Administered 2017-05-21: 600 mg via ORAL
  Filled 2017-05-21: qty 2

## 2017-05-21 MED ORDER — INSULIN DETEMIR 100 UNIT/ML ~~LOC~~ SOLN
12.0000 [IU] | Freq: Two times a day (BID) | SUBCUTANEOUS | Status: DC
  Administered 2017-05-22: 12 [IU] via SUBCUTANEOUS
  Filled 2017-05-21 (×5): qty 0.12

## 2017-05-21 MED ORDER — MIRTAZAPINE 30 MG PO TABS
30.0000 mg | ORAL_TABLET | Freq: Every day | ORAL | Status: DC
  Administered 2017-05-21: 30 mg via ORAL
  Filled 2017-05-21: qty 1

## 2017-05-21 MED ORDER — INSULIN ASPART 100 UNIT/ML ~~LOC~~ SOLN: 0.0000 [IU] | Freq: Every day | SUBCUTANEOUS | Status: DC

## 2017-05-21 MED ORDER — LAMOTRIGINE 100 MG PO TABS
100.0000 mg | ORAL_TABLET | Freq: Two times a day (BID) | ORAL | Status: DC
  Administered 2017-05-21 – 2017-05-22 (×3): 100 mg via ORAL
  Filled 2017-05-21 (×3): qty 1

## 2017-05-21 NOTE — Consult Note (Signed)
Reason for Consult: Nausea, intestinal stenosis Referring Physician: Dr. Elonda Husky is an 62 y.o. female.  HPI: Patient is a 62 year old white female with multiple medical problems, status post gastric bypass surgery in the remote past who also underwent jejunal jejunostomy anastomotic revision by myself in 2005 who presents with a one-week history of nausea, diarrhea, and generalized malaise.  She states that she was eating fine until that time.  She denies any fever or chills.  A CT scan was performed at the time of admission which revealed possible stenosis at the jejunojejunal anastomosis.  She also was admitted for hypokalemia and urinary tract infection.  Since her admission, she was advanced to clear liquid diet and has not had any further episodes of nausea.  She denies any abdominal pain.  Past Medical History:  Diagnosis Date  . Abnormal liver diagnostic imaging 05/20/2017  . Abnormal mammogram 12/2014   F/u diagnostic mammo and u/s NORMAL--resume annual screening mammography  . Arthritis   . Bipolar disorder ([place])    questionable  . Cervical radiculopathy    cervical spondylosis on 2013 MRI.  Dr. 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 Trenton Gammon then did posterior cervical fusion.  . Chronic fatigue   . Chronic nausea 2012/13   Normal upper GI  12/2011 (mild GERD noted +small hiatal hernia)+  . Chronic pain syndrome    Chronic C spine pain with radiculopathy.  Dr. Francesco Runner at [facility] was her pain mgmt MD and he discharged her from [facility] mgmt center b/c she failed a UDS (+marijuana) 04/27/15  . Chronic renal insufficiency, stage II (mild) 02/2015   GFR 70s  . Colon cancer screening 03/06/2011   iFob 11/2010 through her insurer was negative. iFOB 01/2015 NEG.  . Depression   . Diabetes mellitus    urine protein-Cr ratio nl 09/2010, D.R. Screen neg 10/2009  . Fibromyalgia   . Fracture  of rib of left side 08/14/15   5th and 6th  . Hepatic steatosis 2005   ultrasound  . History of kidney stones   . 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 Hyperlipidemia, mixed 2017   Waiting to start statin (trying to take one thing at a time with her, working on glucose control first)  . Insomnia   . Iron deficiency anemia 09/2009   malabsorbtion s/p bariatric surgery  . Nephrolithiasis 11/2012  . Neurosis, anxiety, panic type   . Obesity    s/p bariatric surgery  . Osteopenia 12/2010   Hip T score -2.0.  Spine T score -0.5 (plan to repeat in 2 yrs)  . Other B-complex deficiencies   . Panic attacks   . PONV (postoperative nausea and vomiting)   . Restless legs syndrome   . Syncopal episodes    7/73/17 in the setting of dehydration and sedative medication  . Tobacco dependence   . Vitamin D deficiency 09/2009  . Wears dentures    uppers and lowers  . Wears glasses     Past Surgical History:  Procedure Laterality Date  . ABDOMINAL HYSTERECTOMY     fibroids, ovaries remain  . APPENDECTOMY    . BACK SURGERY    . BLADDER SURGERY     bladder tack  . CERVICAL SPINE SURGERY  09/2009; 2014   2014-ant cerv decomp (Dr. Carloyn Manner)  . CHOLECYSTECTOMY  04/2010   w/lysis of adhesions (open procedure)--path showed chronic cholecystitis and  cholesterol polyp.  . CYSTOSCOPY/RETROGRADE/URETEROSCOPY/STONE EXTRACTION WITH BASKET  11/2012   Left UVJ stone  . CYSTOSCOPY/RETROGRADE/URETEROSCOPY/STONE EXTRACTION WITH BASKET Left 11/21/2012   Procedure: CYSTOSCOPY/RETROGRADE/URETEROSCOPY/STONE EXTRACTION WITH BASKET insertion double j stent;  Surgeon: Ailene Rud, MD;  Location: [facility];  Service: Urology;  Laterality: Left;  . EPIDURAL BLOCK INJECTION  03/2011   Cervical  . HERNIA REPAIR  2005   mesenteric hernia repair, with lysis of adhesions (presented with SBO)  . POSTERIOR CERVICAL FUSION/FORAMINOTOMY N/A 12/31/2016   Procedure: Posterior Cervical Fusion with lateral mass fixation  - Cervical four-five, Cervical five-six;  Surgeon: Earnie Larsson, MD;  Location: [facility];  Service: Neurosurgery;  Laterality: N/A;  . ROUX-EN-Y GASTRIC BYPASS    . TONSILLECTOMY    . TONSILLECTOMY      Family History  Problem Relation Age of Onset  . Heart disease Mother   . Cancer Mother   . Heart disease Father   . Cancer Sister   . Heart disease Sister   . Alzheimer's disease Brother   . Kidney disease Brother     Social History:  reports that she quit smoking about 13 months ago. Her smoking use included cigarettes. She has a 1.00 pack-year smoking history. She has never used smokeless tobacco. She reports that she does not drink alcohol or use drugs.  Allergies:  Allergies  Allergen Reactions  . Aspirin Other (See Comments)    Gastric Bypass  . Latex Other (See Comments)    blisters  . Penicillins     UNSPECIFIED REACTION >"Took large quantities as a child & told to never take again Has patient had a PCN reaction causing immediate rash, facial/tongue/throat swelling, SOB or lightheadedness with hypotension: No Has patient had a PCN reaction causing severe rash involving mucus membranes or skin necrosis: No Has patient had a PCN reaction that required hospitalization: No Has patient had a PCN reaction occurring within the last 10 years: No If all of the above answers are "NO", then may proceed wi  . Codeine Itching    Medications: I have reviewed the patient's current medications.  Results for orders placed or performed during the hospital encounter of 05/19/17 (from the past 48 hour(s))  CBG monitoring, ED     Status: Abnormal   Collection Time: 05/19/17  2:45 PM  Result Value Ref Range   Glucose-Capillary 292 (H) 65 - 99 mg/dL  Lipase, blood     Status: None   Collection Time: 05/19/17  2:58 PM  Result Value Ref Range   Lipase 22 11 - 51 U/L    Comment: Performed at [facility], [address], [place], [place] [address]  Comprehensive metabolic panel     Status:  Abnormal   Collection Time: 05/19/17  2:58 PM  Result Value Ref Range   Sodium 134 (L) 135 - 145 mmol/L   Potassium 3.2 (L) 3.5 - 5.1 mmol/L   Chloride 98 (L) 101 - 111 mmol/L   CO2 22 22 - 32 mmol/L   Glucose, Bld 296 (H) 65 - 99 mg/dL   BUN 15 6 - 20 mg/dL   Creatinine, Ser 0.95 0.44 - 1.00 mg/dL   Calcium 9.4 8.9 - 10.3 mg/dL   Total Protein 8.1 6.5 - 8.1 g/dL   Albumin 3.8 3.5 - 5.0 g/dL   AST 17 15 - 41 U/L   ALT 16 14 - 54 U/L   Alkaline Phosphatase 101 38 - 126 U/L   Total Bilirubin 0.7 0.3 - 1.2 mg/dL  GFR calc non Af Amer >60 >60 mL/min   GFR calc Af Amer >60 >60 mL/min    Comment: (NOTE) The eGFR has been calculated using the CKD EPI equation. This calculation has not been validated in all clinical situations. eGFR's persistently <60 mL/min signify possible Chronic Kidney Disease.    Anion gap 14 5 - 15    Comment: Performed at [facility], [address] [place], [place] [address]  CBC     Status: Abnormal   Collection Time: 05/19/17  2:58 PM  Result Value Ref Range   WBC 10.9 (H) 4.0 - 10.5 K/uL   RBC 4.35 3.87 - 5.11 MIL/uL   Hemoglobin 13.1 12.0 - 15.0 g/dL   HCT 39.8 36.0 - 46.0 %   MCV 91.5 78.0 - 100.0 fL   MCH 30.1 26.0 - 34.0 pg   MCHC 32.9 30.0 - 36.0 g/dL   RDW 13.9 11.5 - 15.5 %   Platelets 339 150 - 400 K/uL    Comment: Performed at [facility], [address] [place], [place] [address]  Hemoglobin A1c     Status: Abnormal   Collection Time: 05/19/17  2:58 PM  Result Value Ref Range   Hgb A1c MFr Bld 11.0 (H) 4.8 - 5.6 %    Comment: (NOTE) Pre diabetes:          5.7%-6.4% Diabetes:              >6.4% Glycemic control for   <7.0% adults with diabetes    Mean Plasma Glucose 269 mg/dL    Comment: Performed at [facility], [place] [address] [place], [place] [address]  Urinalysis, Routine w reflex microscopic     Status: Abnormal   Collection Time: 05/19/17  5:00 PM  Result Value Ref Range   Color, Urine YELLOW YELLOW   APPearance  CLEAR CLEAR   Specific Gravity, Urine 1.025 1.005 - 1.030   pH 6.0 5.0 - 8.0   Glucose, UA 100 (A) NEGATIVE mg/dL   Hgb urine dipstick NEGATIVE NEGATIVE   Bilirubin Urine NEGATIVE NEGATIVE   Ketones, ur NEGATIVE NEGATIVE mg/dL   Protein, ur TRACE (A) NEGATIVE mg/dL   Nitrite POSITIVE (A) NEGATIVE   Leukocytes, UA NEGATIVE NEGATIVE    Comment: Performed at [facility], [address], [place], [place] [address]  Urinalysis, Microscopic (reflex)     Status: Abnormal   Collection Time: 05/19/17  5:00 PM  Result Value Ref Range   RBC / HPF NONE SEEN 0 - 5 RBC/hpf   WBC, UA 0-5 0 - 5 WBC/hpf   Bacteria, UA RARE (A) NONE SEEN   Squamous Epithelial / LPF 0-5 (A) NONE SEEN    Comment: Performed at [facility], [address] [place], [place] [address]  CBG monitoring, ED     Status: Abnormal   Collection Time: 05/19/17  6:20 PM  Result Value Ref Range   Glucose-Capillary 231 (H) 65 - 99 mg/dL  CBG monitoring, ED     Status: Abnormal   Collection Time: 05/19/17  8:47 PM  Result Value Ref Range   Glucose-Capillary 254 (H) 65 - 99 mg/dL  Gastrointestinal Panel by PCR , Stool     Status: None   Collection Time: 05/20/17 12:30 AM  Result Value Ref Range   Campylobacter species NOT DETECTED NOT DETECTED   Plesimonas shigelloides NOT DETECTED NOT DETECTED   Salmonella species NOT DETECTED NOT DETECTED   Yersinia enterocolitica NOT DETECTED NOT DETECTED   Vibrio  species NOT DETECTED NOT DETECTED   Vibrio cholerae NOT DETECTED NOT DETECTED   Enteroaggregative E coli (EAEC) NOT DETECTED NOT DETECTED   Enteropathogenic E coli (EPEC) NOT DETECTED NOT DETECTED   Enterotoxigenic E coli (ETEC) NOT DETECTED NOT DETECTED   Shiga like toxin producing E coli (STEC) NOT DETECTED NOT DETECTED   Shigella/Enteroinvasive E coli (EIEC) NOT DETECTED NOT DETECTED   Cryptosporidium NOT DETECTED NOT DETECTED   Cyclospora cayetanensis NOT DETECTED NOT DETECTED   Entamoeba histolytica NOT DETECTED NOT DETECTED    Giardia lamblia NOT DETECTED NOT DETECTED   Adenovirus F40/41 NOT DETECTED NOT DETECTED   Astrovirus NOT DETECTED NOT DETECTED   Norovirus GI/GII NOT DETECTED NOT DETECTED   Rotavirus A NOT DETECTED NOT DETECTED   Sapovirus (I, II, IV, and V) NOT DETECTED NOT DETECTED    Comment: Performed at [facility], [place] [place], [place] [address]  CBC     Status: None   Collection Time: 05/20/17  5:46 AM  Result Value Ref Range   WBC 8.6 4.0 - 10.5 K/uL   RBC 4.07 3.87 - 5.11 MIL/uL   Hemoglobin 12.3 12.0 - 15.0 g/dL   HCT 37.7 36.0 - 46.0 %   MCV 92.6 78.0 - 100.0 fL   MCH 30.2 26.0 - 34.0 pg   MCHC 32.6 30.0 - 36.0 g/dL   RDW 13.9 11.5 - 15.5 %   Platelets 330 150 - 400 K/uL    Comment: Performed at [facility], [address] [place], [place] [address]  Comprehensive metabolic panel     Status: Abnormal   Collection Time: 05/20/17  5:46 AM  Result Value Ref Range   Sodium 137 135 - 145 mmol/L   Potassium 3.3 (L) 3.5 - 5.1 mmol/L   Chloride 101 101 - 111 mmol/L   CO2 25 22 - 32 mmol/L   Glucose, Bld 298 (H) 65 - 99 mg/dL   BUN 13 6 - 20 mg/dL   Creatinine, Ser 0.87 0.44 - 1.00 mg/dL   Calcium 8.9 8.9 - 10.3 mg/dL   Total Protein 7.3 6.5 - 8.1 g/dL   Albumin 3.4 (L) 3.5 - 5.0 g/dL   AST 16 15 - 41 U/L   ALT 15 14 - 54 U/L   Alkaline Phosphatase 90 38 - 126 U/L   Total Bilirubin 0.4 0.3 - 1.2 mg/dL   GFR calc non Af Amer >60 >60 mL/min   GFR calc Af Amer >60 >60 mL/min    Comment: (NOTE) The eGFR has been calculated using the CKD EPI equation. This calculation has not been validated in all clinical situations. eGFR's persistently <60 mL/min signify possible Chronic Kidney Disease.    Anion gap 11 5 - 15    Comment: Performed at [facility], [address] [place], [place] [address]  Glucose, capillary     Status: Abnormal   Collection Time: 05/20/17  6:15 AM  Result Value Ref Range   Glucose-Capillary 301 (H) 65 - 99 mg/dL   Comment 1 Notify RN     Comment 2 Document in Chart   Glucose, capillary     Status: Abnormal   Collection Time: 05/20/17  8:26 AM  Result Value Ref Range   Glucose-Capillary 277 (H) 65 - 99 mg/dL   Comment 1 Document in Chart   Glucose, capillary     Status: Abnormal   Collection Time: 05/20/17 11:47 AM  Result Value Ref Range   Glucose-Capillary 225 (H) 65 - 99  mg/dL   Comment 1 Document in Chart   Glucose, capillary     Status: None   Collection Time: 05/20/17  5:19 PM  Result Value Ref Range   Glucose-Capillary 79 65 - 99 mg/dL   Comment 1 Notify RN    Comment 2 Document in Chart   Glucose, capillary     Status: Abnormal   Collection Time: 05/20/17  9:45 PM  Result Value Ref Range   Glucose-Capillary 151 (H) 65 - 99 mg/dL   Comment 1 Notify RN    Comment 2 Document in Chart   Glucose, capillary     Status: Abnormal   Collection Time: 05/21/17 12:03 AM  Result Value Ref Range   Glucose-Capillary 115 (H) 65 - 99 mg/dL  Glucose, capillary     Status: Abnormal   Collection Time: 05/21/17  7:51 AM  Result Value Ref Range   Glucose-Capillary 134 (H) 65 - 99 mg/dL   Comment 1 Notify RN    Comment 2 Document in Chart     Ct Abdomen Pelvis W Contrast  Result Date: 05/19/2017 CLINICAL DATA:  Complaining of nausea, vomiting, diarrhea and abdominal pain x8 days. Fever chills. Pain is diffuse. EXAM: CT ABDOMEN AND PELVIS WITH CONTRAST TECHNIQUE: Multidetector CT imaging of the abdomen and pelvis was performed using the standard protocol following bolus administration of intravenous contrast. CONTRAST:  141m ISOVUE-300 IOPAMIDOL (ISOVUE-300) INJECTION 61% COMPARISON:  08/14/2015 CT FINDINGS: Lower chest: Top-normal size heart without pericardial effusion. Minimal subsegmental atelectasis in the lingula. No effusion or pneumothorax. Hepatobiliary: Hepatomegaly with morphologic changes of cirrhosis. Subcapsular areas of hypodensity along the right and left hepatic lobes may reflect old remote subcapsular areas of  trauma/hemorrhage or potentially infarcts. Subtle tubular areas of hypodensity in the right hepatic lobe may reflect chronic posttraumatic change//aerations versus biliary dilatation. No enhancing masses. Scattered subcentimeter rounded hypodensities are also noted statistically consistent with cysts or hemangiomata. Small foci of infection/abscess believed less likely. Status post cholecystectomy. Pancreas: Normal without ductal dilatation or mass. Spleen: Calcified granuloma in the spleen.  No splenomegaly Adrenals/Urinary Tract: Normal bilateral adrenal glands. Symmetric cortical enhancement of both kidneys without nephrolithiasis nor obstructive uropathy. Bladder is unremarkable for degree of distention. Stomach/Bowel: Status post Roux-en-Y gastric bypass. No dehiscence across the staple line. Stenotic appearing jejunojejunostomy site with more proximal dilatation of jejunum and fecalized material within. Status post appendectomy. Unremarkable colon. Vascular/Lymphatic: Mild aortoiliac atherosclerosis. No aneurysm. No adenopathy. Reproductive: Hysterectomy.  No adnexal mass. Other: No free air nor free fluid Musculoskeletal: No acute nor suspicious osseous abnormality. IMPRESSION: 1. 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 2. Morphologic appearance of cirrhosis involving the liver. New subcapsular areas of hypodensity involving the right and left hepatic lobes may represent stigmata of old remote subcapsular hematoma and trauma. Subtle linear lucencies also noted in the right hepatic lobe that may also reflect old posttraumatic change. The possibility of mild intrahepatic biliary dilatation is not entirely excluded. Scattered small hypodensities too small to further characterize are also seen scattered within the liver. Statistically these may represent small cysts or  hemangiomata. Small foci of infection not entirely excluded but believed less likely. Nonemergent MRI may help for further correlation. Electronically Signed   By: DAshley RoyaltyM.D.   On: 05/19/2017 21:49    ROS:  Pertinent items are noted in HPI.  Blood pressure (!) 106/55, pulse 90, temperature  98.3 F (36.8 C), temperature source Oral, resp. rate 17, height 5' 4"  (1.626 m), weight 175 lb (79.4 kg), SpO2 94 %. Physical Exam: Pleasant white female in no acute distress Head is normocephalic, atraumatic Lungs clear to auscultation with equal breath sounds bilaterally Heart examination reveals regular rate and rhythm without S3, S4, murmurs Abdomen is soft, nontender, nondistended.  No hepatosplenomegaly or masses noted. CT scan images personally reviewed  Assessment/Plan: Suspect most of her symptoms are secondary to urinary tract infection.  I do not have an etiology for her diarrhea.  These are inconsistent with the CT findings of a jejunal jejunal anastomotic stricture.  Patient reports having an open cholecystectomy in the recent past which was prolonged secondary to adhesive disease in her abdomen.  Unless her clinical findings are more consistent with a significant obstructive disease, I would be very hesitant on exploring this patient.  As she is tolerating a clear liquid diet, will advance to a bariatric, modified diet.  Patient is fine with that.  Will follow her expectantly with you.  Aviva Signs 05/21/2017, 8:42 AM

## 2017-05-21 NOTE — Progress Notes (Signed)
Inpatient Diabetes Program Recommendations  AACE/ADA: New Consensus Statement on Inpatient Glycemic Control (2015)  Target Ranges:  Prepandial:   less than 140 mg/dL      Peak postprandial:   less than 180 mg/dL (1-2 hours)      Critically ill patients:  140 - 180 mg/dL   Lab Results  Component Value Date   GLUCAP 134 (H) 05/21/2017   HGBA1C 11.0 (H) 05/19/2017    Review of Glycemic Control Results for Diana [name], Diana [name] (MRN [id number]) as of 05/21/2017 09:44  Ref. Range 05/20/2017 11:47 05/20/2017 17:19 05/20/2017 21:45 05/21/2017 00:03 05/21/2017 07:51  Glucose-Capillary Latest Ref Range: 65 - 99 mg/dL 225 (H) 79 151 (H) 115 (H) 134 (H)   Diabetes history: DM2 Outpatient Diabetes medications: Levemir 85 units q hs + Novolog 18-26 units tid [facility] Current orders for Inpatient glycemic control: Levemir 25 units bid + Novolog correction resistant q 4 hrs.  Inpatient Diabetes Program Recommendations: Received consult regarding recommendations for discharge with elevated A1c. -Divide Levemir dose into bid -Continue meal coverage as ordered -Referral to endocrinologist after discharge  Spoke with patient by phone (DM Coordinator @ [facility]).  Patient states she is taking her insulin as prescribed. Patient does not drink sugary drinks, limits carbohydrates, and limits juices. Patient has been limited walking and exercising due to previous surgery. Spoke with pt about A1C results 11.0 with them and explained what an A1C is, basic pathophysiology of DM Type 2, basic home care, basic diabetes diet nutrition principles, importance of checking CBGs and maintaining good CBG control to prevent long-term and short-term complications. Reviewed signs and symptoms of hyperglycemia and hypoglycemia and how to treat hypoglycemia at home. Also reviewed blood sugar goals at home.  RNs to provide ongoing basic DM education at bedside with this patient.   Thank you, Nani Gasser. Onell Mcmath, RN, MSN, CDE  Diabetes  Coordinator Inpatient Glycemic Control Team Team Pager [phone] (8am-5pm) 05/21/2017 10:09 AM

## 2017-05-21 NOTE — Progress Notes (Signed)
Patient refused Levemir 25 Units tonight.  Patient states that we gave her too much insulin this morning, by giving Levemir and sliding scale and her blood sugar dropped low today during the day to 75.  Patient states that this is not her regimen at home.

## 2017-05-21 NOTE — Progress Notes (Signed)
PROGRESS NOTE    Diana [name]  AJG:[id number] DOB: July 15, 1955 DOA: 05/19/2017 PCP: Tammi Sou, MD   Brief Narrative:   Diana [name] is an 62 y.o. female with a PMH of Roux-en-Y gastric bypass, cervical spine surgery, diabetes, and restless leg syndrome who was admitted 05/19/17 for evaluation of a one-week history of nausea, vomiting and diarrhea.  Upon initial evaluation in the ED, CT scan of the abdomen and pelvis showed stenosis/stricture suspected at the jejunal jejunostomy site of the Roux-en-Y with fecallysed material within the jejunal limb.  Surgery subsequently consulted with no plans for ex-lap at this time. Diet has been advanced to follow clinical response.   Assessment & Plan:   Principal Problem:   Nausea vomiting and diarrhea Active Problems:   Diabetes type 2, uncontrolled (HCC)   Bipolar II disorder (HCC)   Malabsorption syndrome   Tobacco dependence   Hypokalemia   History of Roux-en-Y gastric bypass   Abnormal liver diagnostic imaging   1. Ongoing nausea, vomiting, and diarrhea in setting of Roux-en-Y gastric bypass.  Unclear at this point in time if symptoms are related to a potential stricture or stenosis at the jejunojejunostomy site.  Appreciate general surgery consultation with no plans for intervention at this time.  Diet has been advanced to a bariatric soft diet which patient appears to be tolerating.  She is quite concerned along with her husband, however that she continues to have ongoing symptoms.  We will continue to monitor for at least an additional day to see if symptoms resolve.  She does not actually appear to have UTI on urinalysis, and therefore will discontinue Rocephin. 2. Abnormal liver imaging with potential cirrhosis.  Will follow-up outpatient with MRI for further evaluation. 3. Uncontrolled type 2 diabetes.  Continue Levemir twice daily with SSI resistance scale as ordered.  Continue to monitor closely.  Appreciate diabetes  coordinator input. 4. Bipolar 2 disorder.  Resume management with Cymbalta, Lamictal, Remeron, and Ativan. 5. Hypokalemia.  She has undergone repletion with IV fluid with no labs this morning.  Will recheck in a.m. and maintain on lactated Ringer's for now. 6. Polypharmacy.  Continue to monitor closely.   DVT prophylaxis:Lovenox Code Status: Full Family Communication: Husband at bedside Disposition Plan: Home when symptoms improved and diet tolerated. Appreciate ongoing GS input.   Consultants:   GS Dr. Arnoldo Morale  Procedures:   None  Antimicrobials:   None   Subjective: Patient seen and evaluated today with Dr. Arnoldo Morale.  She expresses ongoing concern for her symptoms and is quite anxious.  She is willing to try a diet to see how she will respond, but patient and husband are quite upset that they do not have a clear diagnosis of her problem.  Objective: Vitals:   05/20/17 1447 05/20/17 2102 05/20/17 2149 05/21/17 0532  BP: 117/66  98/85 (!) 106/55  Pulse: 89  91 90  Resp: 14  (!) 24 17  Temp: 98.5 F (36.9 C)  98.4 F (36.9 C) 98.3 F (36.8 C)  TempSrc: Oral  Oral Oral  SpO2: 94% 93% 97% 94%  Weight:      Height:        Intake/Output Summary (Last 24 hours) at 05/21/2017 1321 Last data filed at 05/21/2017 0900 Gross per 24 hour  Intake 741.67 ml  Output -  Net 741.67 ml   Filed Weights   05/19/17 1440  Weight: 79.4 kg (175 lb)    Examination:  General exam: Appears calm and comfortable  Respiratory system: Clear to auscultation. Respiratory effort normal. Cardiovascular system: S1 & S2 heard, RRR. No JVD, murmurs, rubs, gallops or clicks. No pedal edema. Gastrointestinal system: Abdomen is nondistended, soft and nontender. No organomegaly or masses felt. Normal bowel sounds heard. Central nervous system: Alert and oriented. No focal neurological deficits. Extremities: Symmetric 5 x 5 power. Skin: No rashes, lesions or ulcers Psychiatry: Judgement and  insight appear normal. Mood & affect appropriate.     Data Reviewed: I have personally reviewed following labs and imaging studies  CBC: Recent Labs  Lab 05/19/17 1458 05/20/17 0546  WBC 10.9* 8.6  HGB 13.1 12.3  HCT 39.8 37.7  MCV 91.5 92.6  PLT 339 195   Basic Metabolic Panel: Recent Labs  Lab 05/19/17 1458 05/20/17 0546  NA 134* 137  K 3.2* 3.3*  CL 98* 101  CO2 22 25  GLUCOSE 296* 298*  BUN 15 13  CREATININE 0.95 0.87  CALCIUM 9.4 8.9   GFR: Estimated Creatinine Clearance: 70.1 mL/min (by C-G formula based on SCr of 0.87 mg/dL). Liver Function Tests: Recent Labs  Lab 05/19/17 1458 05/20/17 0546  AST 17 16  ALT 16 15  ALKPHOS 101 90  BILITOT 0.7 0.4  PROT 8.1 7.3  ALBUMIN 3.8 3.4*   Recent Labs  Lab 05/19/17 1458  LIPASE 22   No results for input(s): AMMONIA in the last 168 hours. Coagulation Profile: No results for input(s): INR, PROTIME in the last 168 hours. Cardiac Enzymes: No results for input(s): CKTOTAL, CKMB, CKMBINDEX, TROPONINI in the last 168 hours. BNP (last 3 results) No results for input(s): PROBNP in the last 8760 hours. HbA1C: Recent Labs    05/19/17 1458  HGBA1C 11.0*   CBG: Recent Labs  Lab 05/20/17 2145 05/21/17 0003 05/21/17 0751 05/21/17 1040 05/21/17 1150  GLUCAP 151* 115* 134* 165* 217*   Lipid Profile: No results for input(s): CHOL, HDL, LDLCALC, TRIG, CHOLHDL, LDLDIRECT in the last 72 hours. Thyroid Function Tests: No results for input(s): TSH, T4TOTAL, FREET4, T3FREE, THYROIDAB in the last 72 hours. Anemia Panel: No results for input(s): VITAMINB12, FOLATE, FERRITIN, TIBC, IRON, RETICCTPCT in the last 72 hours. Sepsis Labs: No results for input(s): PROCALCITON, LATICACIDVEN in the last 168 hours.  Recent Results (from the past 240 hour(s))  Gastrointestinal Panel by PCR , Stool     Status: None   Collection Time: 05/20/17 12:30 AM  Result Value Ref Range Status   Campylobacter species NOT DETECTED NOT  DETECTED Final   Plesimonas shigelloides NOT DETECTED NOT DETECTED Final   Salmonella species NOT DETECTED NOT DETECTED Final   Yersinia enterocolitica NOT DETECTED NOT DETECTED Final   Vibrio species NOT DETECTED NOT DETECTED Final   Vibrio cholerae NOT DETECTED NOT DETECTED Final   Enteroaggregative E coli (EAEC) NOT DETECTED NOT DETECTED Final   Enteropathogenic E coli (EPEC) NOT DETECTED NOT DETECTED Final   Enterotoxigenic E coli (ETEC) NOT DETECTED NOT DETECTED Final   Shiga like toxin producing E coli (STEC) NOT DETECTED NOT DETECTED Final   Shigella/Enteroinvasive E coli (EIEC) NOT DETECTED NOT DETECTED Final   Cryptosporidium NOT DETECTED NOT DETECTED Final   Cyclospora cayetanensis NOT DETECTED NOT DETECTED Final   Entamoeba histolytica NOT DETECTED NOT DETECTED Final   Giardia lamblia NOT DETECTED NOT DETECTED Final   Adenovirus F40/41 NOT DETECTED NOT DETECTED Final   Astrovirus NOT DETECTED NOT DETECTED Final   Norovirus GI/GII NOT DETECTED NOT DETECTED Final   Rotavirus A NOT DETECTED NOT DETECTED Final  Sapovirus (I, II, IV, and V) NOT DETECTED NOT DETECTED Final    Comment: Performed at [facility], [address] [place], [place] [address]      Radiology Studies: Ct Abdomen Pelvis W Contrast  Result Date: 05/19/2017 CLINICAL DATA:  Complaining of nausea, vomiting, diarrhea and abdominal pain x8 days. Fever chills. Pain is diffuse. EXAM: CT ABDOMEN AND PELVIS WITH CONTRAST TECHNIQUE: Multidetector CT imaging of the abdomen and pelvis was performed using the standard protocol following bolus administration of intravenous contrast. CONTRAST:  182m ISOVUE-300 IOPAMIDOL (ISOVUE-300) INJECTION 61% COMPARISON:  08/14/2015 CT FINDINGS: Lower chest: Top-normal size heart without pericardial effusion. Minimal subsegmental atelectasis in the lingula. No effusion or pneumothorax. Hepatobiliary: Hepatomegaly with morphologic changes of cirrhosis. Subcapsular areas of  hypodensity along the right and left hepatic lobes may reflect old remote subcapsular areas of trauma/hemorrhage or potentially infarcts. Subtle tubular areas of hypodensity in the right hepatic lobe may reflect chronic posttraumatic change//aerations versus biliary dilatation. No enhancing masses. Scattered subcentimeter rounded hypodensities are also noted statistically consistent with cysts or hemangiomata. Small foci of infection/abscess believed less likely. Status post cholecystectomy. Pancreas: Normal without ductal dilatation or mass. Spleen: Calcified granuloma in the spleen.  No splenomegaly Adrenals/Urinary Tract: Normal bilateral adrenal glands. Symmetric cortical enhancement of both kidneys without nephrolithiasis nor obstructive uropathy. Bladder is unremarkable for degree of distention. Stomach/Bowel: Status post Roux-en-Y gastric bypass. No dehiscence across the staple line. Stenotic appearing jejunojejunostomy site with more proximal dilatation of jejunum and fecalized material within. Status post appendectomy. Unremarkable colon. Vascular/Lymphatic: Mild aortoiliac atherosclerosis. No aneurysm. No adenopathy. Reproductive: Hysterectomy.  No adnexal mass. Other: No free air nor free fluid Musculoskeletal: No acute nor suspicious osseous abnormality. IMPRESSION: 1. 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 2. Morphologic appearance of cirrhosis involving the liver. New subcapsular areas of hypodensity involving the right and left hepatic lobes may represent stigmata of old remote subcapsular hematoma and trauma. Subtle linear lucencies also noted in the right hepatic lobe that may also reflect old posttraumatic change. The possibility of mild intrahepatic biliary dilatation is not entirely excluded. Scattered small hypodensities too small to further characterize are  also seen scattered within the liver. Statistically these may represent small cysts or hemangiomata. Small foci of infection not entirely excluded but believed less likely. Nonemergent MRI may help for further correlation. Electronically Signed   By: DAshley RoyaltyM.D.   On: 05/19/2017 21:49      Scheduled Meds: . enoxaparin (LOVENOX) injection  40 mg Subcutaneous Q24H  . insulin aspart  0-20 Units Subcutaneous Q4H  . insulin detemir  25 Units Subcutaneous BID   Continuous Infusions: . cefTRIAXone (ROCEPHIN)  IV Stopped (05/21/17 0520)  . lactated ringers       LOS: 2 days    Time spent: 30 minutes    Altair Stanko DDarleen Crocker DO [facility] Pager [phone] If 7PM-7AM, please contact night-coverage [email or web address] Password [email or web address] 05/21/2017, 1:21 PM

## 2017-05-21 NOTE — Care Management Important Message (Signed)
Important Message  Patient Details  Name: Diana [name] MRN: [id number] Date of Birth: 1955-09-18   Medicare Important Message Given:  Yes    Shelda Altes 05/21/2017, 11:59 AM

## 2017-05-22 LAB — COMPREHENSIVE METABOLIC PANEL
ALBUMIN: 2.9 g/dL — AB (ref 3.5–5.0)
ALT: 15 U/L (ref 14–54)
AST: 18 U/L (ref 15–41)
Alkaline Phosphatase: 68 U/L (ref 38–126)
Anion gap: 9 (ref 5–15)
BILIRUBIN TOTAL: 0.3 mg/dL (ref 0.3–1.2)
CHLORIDE: 109 mmol/L (ref 101–111)
CO2: 25 mmol/L (ref 22–32)
CREATININE: 0.83 mg/dL (ref 0.44–1.00)
Calcium: 8.6 mg/dL — ABNORMAL LOW (ref 8.9–10.3)
GFR calc Af Amer: >60 mL/min (ref >60)
GLUCOSE: 104 mg/dL — AB (ref 65–99)
POTASSIUM: 3.9 mmol/L (ref 3.5–5.1)
Sodium: 143 mmol/L (ref 135–145)
Total Protein: 6 g/dL — ABNORMAL LOW (ref 6.5–8.1)

## 2017-05-22 LAB — CBC
HEMATOCRIT: 33.4 % — AB (ref 36.0–46.0)
Hemoglobin: 11.1 g/dL — ABNORMAL LOW (ref 12.0–15.0)
MCH: 31 pg (ref 26.0–34.0)
MCHC: 33.2 g/dL (ref 30.0–36.0)
MCV: 93.3 fL (ref 78.0–100.0)
PLATELETS: 303 10*3/uL (ref 150–400)
RBC: 3.58 MIL/uL — ABNORMAL LOW (ref 3.87–5.11)
RDW: 14.2 % (ref 11.5–15.5)
WBC: 6.2 10*3/uL (ref 4.0–10.5)

## 2017-05-22 LAB — GLUCOSE, CAPILLARY
GLUCOSE-CAPILLARY: 199 mg/dL — AB (ref 65–99)
GLUCOSE-CAPILLARY: 196 mg/dL — AB (ref 65–99)
Glucose-Capillary: 119 mg/dL — ABNORMAL HIGH (ref 65–99)

## 2017-05-22 MED ORDER — INSULIN DETEMIR 100 UNIT/ML ~~LOC~~ SOLN: 12.0000 [IU] | Freq: Two times a day (BID) | SUBCUTANEOUS | 11 refills | Status: DC

## 2017-05-22 MED ORDER — ONDANSETRON HCL 4 MG PO TABS: 4.0000 mg | ORAL_TABLET | Freq: Four times a day (QID) | ORAL | 0 refills | Status: DC | PRN

## 2017-05-22 NOTE — Discharge Summary (Signed)
Physician Discharge Summary  Diana [name] OFB:[id number] DOB: 01-16-1956 DOA: 05/19/2017  PCP: Diana Sou, MD  Admit date: 05/19/2017  Discharge date: 05/22/2017  Admitted From:Home  Disposition:  Home  Recommendations for Outpatient Follow-up:  1. Follow up with PCP in 1-2 weeks  Home Health:N/A  Equipment/Devices:None  Discharge Condition:Stable  CODE STATUS: Full  Diet recommendation: Heart Healthy/Bariatric  Brief/Interim Summary:  Diana Pickney Lemonsis an 62 y.o.femalewith a PMH of Roux-en-Y gastric bypass, cervical spine surgery, diabetes, and restless leg syndrome who was admitted 05/19/17 for evaluation of a one-week history of nausea, vomiting and diarrhea. 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 Surgery subsequently consulted with no plans for ex-lap at this time. Diet has been advanced and she has made a great clinical response.  She states that she has no further nausea, vomiting, or diarrhea and overall is feeling much better this morning.  She has been tolerating her diet and has been seen by general surgery with no further plans at this time.  She would like to go home if possible and appears stable enough to do so.  Her Rocephin had been discontinued as she does not appear to have UTI.  She will require follow-up outpatient MRI for abnormal liver imaging with noted potential cirrhosis.  Her diabetes has been very well controlled and her Levemir has been adjusted to 12 units twice daily at this time on account of this fact.   Discharge Diagnoses:  Principal Problem:   Nausea vomiting and diarrhea Active Problems:   Diabetes type 2, uncontrolled (HCC)   Bipolar II disorder (HCC)   Malabsorption syndrome   Tobacco dependence   Hypokalemia   History of Roux-en-Y gastric bypass   Abnormal liver  diagnostic imaging  1. Ongoing nausea, vomiting, and diarrhea in setting of Roux-en-Y gastric bypass.    This has now resolved and appears to have been related to a viral gastroenteritis.  She has improved with symptomatic treatment and is now tolerating a diet. 2. Abnormal liver imaging with potential cirrhosis.  Will follow-up outpatient with MRI for further evaluation.  This will need to be set up by her PCP. 3. Uncontrolled type 2 diabetes-improved.  Patient will be discharged on Levemir 12 units twice daily at this time with recommendations to remain off mealtime insulin as her intake is uncertain.  This needs to be followed carefully and patient knows how to make adjustments at home. 4. Bipolar 2 disorder.  Resume management with Cymbalta, Lamictal, Remeron, and Ativan. 5. Hypokalemia-resolved. 6. Polypharmacy.  Continue to monitor closely.  Discharge Instructions  Discharge Instructions    Diet - low sodium heart healthy   Complete by:  As directed    Increase activity slowly   Complete by:  As directed      Allergies as of 05/22/2017      Reactions   Aspirin Other (See Comments)   Gastric Bypass   Latex Other (See Comments)   blisters   Penicillins    UNSPECIFIED REACTION >"Took large quantities as a child & told to never take again Has patient had a PCN reaction causing immediate rash, facial/tongue/throat swelling, SOB or lightheadedness with hypotension: No Has patient had a PCN reaction causing severe rash involving mucus membranes or skin necrosis: No Has patient had a PCN reaction that required hospitalization: No Has patient had a PCN reaction  occurring within the last 10 years: No If all of the above answers are "NO", then may proceed wi   Codeine Itching      Medication List    STOP taking these medications   insulin aspart 100 UNIT/ML injection Commonly known as:  NOVOLOG     TAKE these medications   amphetamine-dextroamphetamine 10 MG tablet Commonly known  as:  ADDERALL 1 tab po bid What changed:    how much to take  how to take this  when to take this  additional instructions   atorvastatin 40 MG tablet Commonly known as:  LIPITOR TAKE (1) TABLET BY MOUTH AT BEDTIME.   Cholecalciferol 2000 units Caps Take 1 capsule (2,000 Units total) by mouth daily.   DULoxetine 30 MG capsule Commonly known as:  CYMBALTA TAKE (1) CAPSULE BY MOUTH TWICE DAILY. What changed:  See the new instructions.   gabapentin 300 MG capsule Commonly known as:  NEURONTIN TAKE 4 CAPSULES BY MOUTH AT BEDTIME. What changed:  See the new instructions.   HYDROcodone-acetaminophen 10-325 MG tablet Commonly known as:  NORCO Take 1-1.5 tablets by mouth every 4 (four) hours as needed for moderate pain or severe pain.   insulin detemir 100 UNIT/ML injection Commonly known as:  LEVEMIR Inject 0.12 mLs (12 Units total) into the skin 2 (two) times daily. What changed:    how much to take  how to take this  when to take this  additional instructions   lamoTRIgine 100 MG tablet Commonly known as:  LAMICTAL TAKE 1 TABLET BY MOUTH TWICE DAILY. What changed:    how much to take  how to take this  when to take this   LORazepam 2 MG tablet Commonly known as:  ATIVAN TAKE (1) TABLET BY MOUTH EVERY EIGHT HOURS AS NEEDED. What changed:    how much to take  how to take this  when to take this  reasons to take this  additional instructions   LORazepam 2 MG tablet Commonly known as:  ATIVAN TAKE (1) TABLET BY MOUTH EVERY EIGHT HOURS AS NEEDED. What changed:  Another medication with the same name was changed. Make sure you understand how and when to take each.   mirtazapine 30 MG tablet Commonly known as:  REMERON TAKE (1) TABLET BY MOUTH AT BEDTIME.   ondansetron 4 MG tablet Commonly known as:  ZOFRAN Take 1 tablet (4 mg total) by mouth every 6 (six) hours as needed for nausea.   tiZANidine 4 MG tablet Commonly known as:  ZANAFLEX Take 4  mg by mouth 2 (two) times daily.   TUMS ULTRA 1000 400 MG chewable tablet Generic drug:  calcium elemental as carbonate Chew 1,000 mg by mouth daily.       Allergies  Allergen Reactions  . Aspirin Other (See Comments)    Gastric Bypass  . Latex Other (See Comments)    blisters  . Penicillins     UNSPECIFIED REACTION >"Took large quantities as a child & told to never take again Has patient had a PCN reaction causing immediate rash, facial/tongue/throat swelling, SOB or lightheadedness with hypotension: No Has patient had a PCN reaction causing severe rash involving mucus membranes or skin necrosis: No Has patient had a PCN reaction that required hospitalization: No Has patient had a PCN reaction occurring within the last 10 years: No If all of the above answers are "NO", then may proceed wi  . Codeine Itching    Consultations:  General Surgery-Dr. Arnoldo Morale  Procedures/Studies: Ct Abdomen Pelvis W Contrast  Result Date: 05/19/2017 CLINICAL DATA:  Complaining of nausea, vomiting, diarrhea and abdominal pain x8 days. Fever chills. Pain is diffuse. EXAM: CT ABDOMEN AND PELVIS WITH CONTRAST TECHNIQUE: Multidetector CT imaging of the abdomen and pelvis was performed using the standard protocol following bolus administration of intravenous contrast. CONTRAST:  140m ISOVUE-300 IOPAMIDOL (ISOVUE-300) INJECTION 61% COMPARISON:  08/14/2015 CT FINDINGS: Lower chest: Top-normal size heart without pericardial effusion. Minimal subsegmental atelectasis in the lingula. No effusion or pneumothorax. Hepatobiliary: Hepatomegaly with morphologic changes of cirrhosis. Subcapsular areas of hypodensity along the right and left hepatic lobes may reflect old remote subcapsular areas of trauma/hemorrhage or potentially infarcts. Subtle tubular areas of hypodensity in the right hepatic lobe may reflect chronic posttraumatic change//aerations versus biliary dilatation. No enhancing masses. Scattered  subcentimeter rounded hypodensities are also noted statistically consistent with cysts or hemangiomata. Small foci of infection/abscess believed less likely. Status post cholecystectomy. Pancreas: Normal without ductal dilatation or mass. Spleen: Calcified granuloma in the spleen.  No splenomegaly Adrenals/Urinary Tract: Normal bilateral adrenal glands. Symmetric cortical enhancement of both kidneys without nephrolithiasis nor obstructive uropathy. Bladder is unremarkable for degree of distention. Stomach/Bowel: Status post Roux-en-Y gastric bypass. No dehiscence across the staple line. Stenotic appearing jejunojejunostomy site with more proximal dilatation of jejunum and fecalized material within. Status post appendectomy. Unremarkable colon. Vascular/Lymphatic: Mild aortoiliac atherosclerosis. No aneurysm. No adenopathy. Reproductive: Hysterectomy.  No adnexal mass. Other: No free air nor free fluid Musculoskeletal: No acute nor suspicious osseous abnormality. IMPRESSION: 1. 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 2. Morphologic appearance of cirrhosis involving the liver. New subcapsular areas of hypodensity involving the right and left hepatic lobes may represent stigmata of old remote subcapsular hematoma and trauma. Subtle linear lucencies also noted in the right hepatic lobe that may also reflect old posttraumatic change. The possibility of mild intrahepatic biliary dilatation is not entirely excluded. Scattered small hypodensities too small to further characterize are also seen scattered within the liver. Statistically these may represent small cysts or hemangiomata. Small foci of infection not entirely excluded but believed less likely. Nonemergent MRI may help for further correlation. Electronically Signed   By: DAshley RoyaltyM.D.   On: 05/19/2017 21:49     Discharge  Exam: Vitals:   05/21/17 2051 05/22/17 0431  BP: 129/73 120/61  Pulse: 83 86  Resp: 16   Temp: 98.4 F (36.9 C) 98.3 F (36.8 C)  SpO2: 96% 92%   Vitals:   05/21/17 0532 05/21/17 2021 05/21/17 2051 05/22/17 0431  BP: (!) 106/55  129/73 120/61  Pulse: 90  83 86  Resp: 17  16   Temp: 98.3 F (36.8 C)  98.4 F (36.9 C) 98.3 F (36.8 C)  TempSrc: Oral  Oral Oral  SpO2: 94% 91% 96% 92%  Weight:      Height:        General: Pt is alert, awake, not in acute distress Cardiovascular: RRR, S1/S2 +, no rubs, no gallops Respiratory: CTA bilaterally, no wheezing, no rhonchi Abdominal: Soft, NT, ND, bowel sounds + Extremities: no edema, no cyanosis    The results of significant diagnostics from this hospitalization (including imaging, microbiology, ancillary and laboratory) are listed below for reference.     Microbiology: Recent Results (from the past 240 hour(s))  Gastrointestinal Panel by PCR , Stool     Status: None  Collection Time: 05/20/17 12:30 AM  Result Value Ref Range Status   Campylobacter species NOT DETECTED NOT DETECTED Final   Plesimonas shigelloides NOT DETECTED NOT DETECTED Final   Salmonella species NOT DETECTED NOT DETECTED Final   Yersinia enterocolitica NOT DETECTED NOT DETECTED Final   Vibrio species NOT DETECTED NOT DETECTED Final   Vibrio cholerae NOT DETECTED NOT DETECTED Final   Enteroaggregative E coli (EAEC) NOT DETECTED NOT DETECTED Final   Enteropathogenic E coli (EPEC) NOT DETECTED NOT DETECTED Final   Enterotoxigenic E coli (ETEC) NOT DETECTED NOT DETECTED Final   Shiga like toxin producing E coli (STEC) NOT DETECTED NOT DETECTED Final   Shigella/Enteroinvasive E coli (EIEC) NOT DETECTED NOT DETECTED Final   Cryptosporidium NOT DETECTED NOT DETECTED Final   Cyclospora cayetanensis NOT DETECTED NOT DETECTED Final   Entamoeba histolytica NOT DETECTED NOT DETECTED Final   Giardia lamblia NOT DETECTED NOT DETECTED Final   Adenovirus F40/41 NOT  DETECTED NOT DETECTED Final   Astrovirus NOT DETECTED NOT DETECTED Final   Norovirus GI/GII NOT DETECTED NOT DETECTED Final   Rotavirus A NOT DETECTED NOT DETECTED Final   Sapovirus (I, II, IV, and V) NOT DETECTED NOT DETECTED Final    Comment: Performed at [facility], [address] [place], [place] [address]     Labs: BNP (last 3 results) No results for input(s): BNP in the last 8760 hours. Basic Metabolic Panel: Recent Labs  Lab 05/19/17 1458 05/20/17 0546 05/22/17 0506  NA 134* 137 143  K 3.2* 3.3* 3.9  CL 98* 101 109  CO2 22 25 25   GLUCOSE 296* 298* 104*  BUN 15 13 <5*  CREATININE 0.95 0.87 0.83  CALCIUM 9.4 8.9 8.6*   Liver Function Tests: Recent Labs  Lab 05/19/17 1458 05/20/17 0546 05/22/17 0506  AST 17 16 18   ALT 16 15 15   ALKPHOS 101 90 68  BILITOT 0.7 0.4 0.3  PROT 8.1 7.3 6.0*  ALBUMIN 3.8 3.4* 2.9*   Recent Labs  Lab 05/19/17 1458  LIPASE 22   No results for input(s): AMMONIA in the last 168 hours. CBC: Recent Labs  Lab 05/19/17 1458 05/20/17 0546 05/22/17 0506  WBC 10.9* 8.6 6.2  HGB 13.1 12.3 11.1*  HCT 39.8 37.7 33.4*  MCV 91.5 92.6 93.3  PLT 339 330 303   Cardiac Enzymes: No results for input(s): CKTOTAL, CKMB, CKMBINDEX, TROPONINI in the last 168 hours. BNP: Invalid input(s): POCBNP CBG: Recent Labs  Lab 05/21/17 1150 05/21/17 1549 05/21/17 1655 05/21/17 2047 05/22/17 0756  GLUCAP 217* 93 121* 167* 119*   D-Dimer No results for input(s): DDIMER in the last 72 hours. Hgb A1c Recent Labs    05/19/17 1458  HGBA1C 11.0*   Lipid Profile No results for input(s): CHOL, HDL, LDLCALC, TRIG, CHOLHDL, LDLDIRECT in the last 72 hours. Thyroid function studies No results for input(s): TSH, T4TOTAL, T3FREE, THYROIDAB in the last 72 hours.  Invalid input(s): FREET3 Anemia work up No results for input(s): VITAMINB12, FOLATE, FERRITIN, TIBC, IRON, RETICCTPCT in the last 72 hours. Urinalysis    Component Value  Date/Time   COLORURINE YELLOW 05/19/2017 1700   APPEARANCEUR CLEAR 05/19/2017 1700   LABSPEC 1.025 05/19/2017 1700   PHURINE 6.0 05/19/2017 1700   GLUCOSEU 100 (A) 05/19/2017 1700   HGBUR NEGATIVE 05/19/2017 1700   BILIRUBINUR NEGATIVE 05/19/2017 1700   BILIRUBINUR negative 05/20/2014 1513   KETONESUR NEGATIVE 05/19/2017 1700   PROTEINUR TRACE (A) 05/19/2017 1700   UROBILINOGEN 0.2 05/20/2014 1513  UROBILINOGEN 1.0 11/20/2012 2001   NITRITE POSITIVE (A) 05/19/2017 1700   LEUKOCYTESUR NEGATIVE 05/19/2017 1700   Sepsis Labs Invalid input(s): PROCALCITONIN,  WBC,  LACTICIDVEN Microbiology Recent Results (from the past 240 hour(s))  Gastrointestinal Panel by PCR , Stool     Status: None   Collection Time: 05/20/17 12:30 AM  Result Value Ref Range Status   Campylobacter species NOT DETECTED NOT DETECTED Final   Plesimonas shigelloides NOT DETECTED NOT DETECTED Final   Salmonella species NOT DETECTED NOT DETECTED Final   Yersinia enterocolitica NOT DETECTED NOT DETECTED Final   Vibrio species NOT DETECTED NOT DETECTED Final   Vibrio cholerae NOT DETECTED NOT DETECTED Final   Enteroaggregative E coli (EAEC) NOT DETECTED NOT DETECTED Final   Enteropathogenic E coli (EPEC) NOT DETECTED NOT DETECTED Final   Enterotoxigenic E coli (ETEC) NOT DETECTED NOT DETECTED Final   Shiga like toxin producing E coli (STEC) NOT DETECTED NOT DETECTED Final   Shigella/Enteroinvasive E coli (EIEC) NOT DETECTED NOT DETECTED Final   Cryptosporidium NOT DETECTED NOT DETECTED Final   Cyclospora cayetanensis NOT DETECTED NOT DETECTED Final   Entamoeba histolytica NOT DETECTED NOT DETECTED Final   Giardia lamblia NOT DETECTED NOT DETECTED Final   Adenovirus F40/41 NOT DETECTED NOT DETECTED Final   Astrovirus NOT DETECTED NOT DETECTED Final   Norovirus GI/GII NOT DETECTED NOT DETECTED Final   Rotavirus A NOT DETECTED NOT DETECTED Final   Sapovirus (I, II, IV, and V) NOT DETECTED NOT DETECTED Final     Comment: Performed at [facility], [address] [place], [place] [address]     Time coordinating discharge: 35 minutes  SIGNED:   Rodena Goldmann, DO [facility] 05/22/2017, 8:59 AM Pager [phone]  If 7PM-7AM, please contact night-coverage [email or web address] Password [email or web address]

## 2017-05-22 NOTE — Progress Notes (Signed)
Pt given discharge instructions.  Pt verbalized understanding of instructions.  Pt discharged home with family in stable condition.

## 2017-05-22 NOTE — Progress Notes (Signed)
Pharmacy Antibiotic Note  Diana [name] is a 62 y.o. female admitted on 05/19/2017 with UTI.  Pharmacy has been consulted for Rocephin dosing.  Plan: Continue Rocephin 1gm IV every 24 hours. Monitor labs, micro and vitals.  Dose stable for age, weight, renal function and indication. No pharmacokinetic monitoring needed. Sign off.   Height: 5' 4"  (162.6 cm) Weight: 175 lb (79.4 kg) IBW/kg (Calculated) : 54.7  Temp (24hrs), Avg:98.4 F (36.9 C), Min:98.3 F (36.8 C), Max:98.4 F (36.9 C)  Recent Labs  Lab 05/19/17 1458 05/20/17 0546 05/22/17 0506  WBC 10.9* 8.6 6.2  CREATININE 0.95 0.87 0.83    Estimated Creatinine Clearance: 73.5 mL/min (by C-G formula based on SCr of 0.83 mg/dL).    Allergies  Allergen Reactions  . Aspirin Other (See Comments)    Gastric Bypass  . Latex Other (See Comments)    blisters  . Penicillins     UNSPECIFIED REACTION >"Took large quantities as a child & told to never take again Has patient had a PCN reaction causing immediate rash, facial/tongue/throat swelling, SOB or lightheadedness with hypotension: No Has patient had a PCN reaction causing severe rash involving mucus membranes or skin necrosis: No Has patient had a PCN reaction that required hospitalization: No Has patient had a PCN reaction occurring within the last 10 years: No If all of the above answers are "NO", then may proceed wi  . Codeine Itching   Antimicrobials this admission: Rocephin 4/9 >>    >>   Dose adjustments this admission: n/a   Microbiology results:  BCx:  4/8 UCx: cancelled   Sputum:   4/9 GI PCR: (-)  Thank you for allowing pharmacy to be a part of this patient's care.  Pricilla Larsson 05/22/2017 11:48 AM

## 2017-05-22 NOTE — Progress Notes (Signed)
  Subjective: Patient had a good night.  She is tolerating bariatric diet well.  Her diarrhea seems to have slowed down.  Abdominal pain.  Objective: Vital signs in last 24 hours: Temp:  [98.3 F (36.8 C)-98.4 F (36.9 C)] 98.3 F (36.8 C) (04/11 0431) Pulse Rate:  [83-86] 86 (04/11 0431) Resp:  [16] 16 (04/10 2051) BP: (120-129)/(61-73) 120/61 (04/11 0431) SpO2:  [91 %-96 %] 92 % (04/11 0431) Last BM Date: 05/21/17  Intake/Output from previous day: 04/10 0701 - 04/11 0700 In: 433.8 [P.O.:360; I.V.:73.8] Out: 300 [Urine:300] Intake/Output this shift: No intake/output data recorded.  General appearance: alert, cooperative and no distress GI: soft, non-tender; bowel sounds normal; no masses,  no organomegaly  Lab Results:  Recent Labs    05/20/17 0546 05/22/17 0506  WBC 8.6 6.2  HGB 12.3 11.1*  HCT 37.7 33.4*  PLT 330 303   BMET Recent Labs    05/20/17 0546 05/22/17 0506  NA 137 143  K 3.3* 3.9  CL 101 109  CO2 25 25  GLUCOSE 298* 104*  BUN 13 <5*  CREATININE 0.87 0.83  CALCIUM 8.9 8.6*   PT/INR No results for input(s): LABPROT, INR in the last 72 hours.  Studies/Results: No results found.  Anti-infectives: Anti-infectives (From admission, onward)   Start     Dose/Rate Route Frequency Ordered Stop   05/21/17 0600  cefTRIAXone (ROCEPHIN) 1 g in sodium chloride 0.9 % 100 mL IVPB  Status:  Discontinued     1 g 200 mL/hr over 30 Minutes Intravenous Every 24 hours 05/20/17 0915 05/21/17 1327   05/20/17 0345  cefTRIAXone (ROCEPHIN) 2 g in sodium chloride 0.9 % 100 mL IVPB     2 g 200 mL/hr over 30 Minutes Intravenous  Once 05/20/17 0342 05/20/17 3710      Assessment/Plan: Impression: Nausea and vomiting resolved.  Tolerating diet well.  Okay for discharge from surgery standpoint.  LOS: 3 days    Aviva Signs 05/22/2017

## 2017-05-23 ENCOUNTER — Telehealth: Payer: Self-pay

## 2017-05-23 NOTE — Telephone Encounter (Signed)
Noted. Nurse contact with pt --TCM.

## 2017-05-23 NOTE — Telephone Encounter (Signed)
Transition Care Management Follow-up Telephone Call  Admit date: 05/19/2017  Discharge date: 05/22/2017  Principal Problem:   Nausea vomiting and diarrhea    How have you been since you were released from the hospital? "not so great". Pt reports nausea and diarrhea have improved.    Do you understand why you were in the hospital? yes, pt states diagnosis not determined.    Do you understand the discharge instructions? yes   Where were you discharged to? Home. Lives with husband.    Items Reviewed:  Medications reviewed: yes  Allergies reviewed: yes  Dietary changes reviewed: yes  Referrals reviewed: yes   Functional Questionnaire:   Activities of Daily Living (ADLs):   She states they are independent in the following: ambulation, bathing and hygiene, feeding, continence, grooming, toileting and dressing States they require assistance with the following: None.    Any transportation issues/concerns?: no   Any patient concerns? no   Confirmed importance and date/time of follow-up visits scheduled yes  Patient to call back to schedule hospital f/u.   Confirmed with patient if condition begins to worsen call PCP or go to the ER.  Patient was given the office number and encouraged to call back with question or concerns.  : yes

## 2017-06-04 DIAGNOSIS — S129XXA Fracture of neck, unspecified, initial encounter: Secondary | ICD-10-CM | POA: Diagnosis not present

## 2017-06-20 ENCOUNTER — Inpatient Hospital Stay: Payer: Self-pay | Admitting: Family Medicine

## 2017-06-26 ENCOUNTER — Inpatient Hospital Stay: Payer: Self-pay | Admitting: Family Medicine

## 2017-07-04 ENCOUNTER — Other Ambulatory Visit: Payer: Self-pay | Admitting: Family Medicine

## 2017-07-09 ENCOUNTER — Encounter: Payer: Self-pay | Admitting: *Deleted

## 2017-07-09 ENCOUNTER — Ambulatory Visit: Payer: Medicare HMO | Admitting: Family Medicine

## 2017-07-09 ENCOUNTER — Encounter: Payer: Self-pay | Admitting: Family Medicine

## 2017-07-09 VITALS — BP 101/66 | HR 95 | Temp 98.3°F | Resp 16 | Ht 62.0 in | Wt 178.2 lb

## 2017-07-09 DIAGNOSIS — Z79899 Other long term (current) drug therapy: Secondary | ICD-10-CM | POA: Diagnosis not present

## 2017-07-09 DIAGNOSIS — D649 Anemia, unspecified: Secondary | ICD-10-CM | POA: Diagnosis not present

## 2017-07-09 DIAGNOSIS — K9089 Other intestinal malabsorption: Secondary | ICD-10-CM

## 2017-07-09 DIAGNOSIS — E118 Type 2 diabetes mellitus with unspecified complications: Secondary | ICD-10-CM

## 2017-07-09 DIAGNOSIS — Z794 Long term (current) use of insulin: Secondary | ICD-10-CM

## 2017-07-09 DIAGNOSIS — K529 Noninfective gastroenteritis and colitis, unspecified: Secondary | ICD-10-CM

## 2017-07-09 DIAGNOSIS — F4000 Agoraphobia, unspecified: Secondary | ICD-10-CM | POA: Diagnosis not present

## 2017-07-09 DIAGNOSIS — F411 Generalized anxiety disorder: Secondary | ICD-10-CM | POA: Diagnosis not present

## 2017-07-09 DIAGNOSIS — K7460 Unspecified cirrhosis of liver: Secondary | ICD-10-CM

## 2017-07-09 DIAGNOSIS — F909 Attention-deficit hyperactivity disorder, unspecified type: Secondary | ICD-10-CM | POA: Diagnosis not present

## 2017-07-09 DIAGNOSIS — K760 Fatty (change of) liver, not elsewhere classified: Secondary | ICD-10-CM

## 2017-07-09 MED ORDER — LORAZEPAM 2 MG PO TABS: ORAL_TABLET | ORAL | 5 refills | Status: DC

## 2017-07-09 MED ORDER — INSULIN DETEMIR 100 UNIT/ML ~~LOC~~ SOLN: SUBCUTANEOUS | 6 refills | Status: DC

## 2017-07-09 MED ORDER — MIRTAZAPINE 30 MG PO TABS: ORAL_TABLET | ORAL | 6 refills | Status: DC

## 2017-07-09 MED ORDER — GABAPENTIN 300 MG PO CAPS: ORAL_CAPSULE | ORAL | 6 refills | Status: DC

## 2017-07-09 MED ORDER — AMPHETAMINE-DEXTROAMPHETAMINE 10 MG PO TABS
ORAL_TABLET | ORAL | 0 refills | Status: DC
Start: 2017-07-09 — End: 2018-02-27

## 2017-07-09 MED ORDER — ONDANSETRON HCL 4 MG PO TABS: 4.0000 mg | ORAL_TABLET | Freq: Four times a day (QID) | ORAL | 6 refills | Status: DC | PRN

## 2017-07-09 MED ORDER — DULOXETINE HCL 30 MG PO CPEP: ORAL_CAPSULE | ORAL | 6 refills | Status: DC

## 2017-07-09 MED ORDER — TIZANIDINE HCL 4 MG PO TABS: 4.0000 mg | ORAL_TABLET | Freq: Two times a day (BID) | ORAL | 6 refills | Status: DC

## 2017-07-09 MED ORDER — LAMOTRIGINE 100 MG PO TABS: 100.0000 mg | ORAL_TABLET | Freq: Two times a day (BID) | ORAL | 6 refills | Status: DC

## 2017-07-09 MED ORDER — FLUCONAZOLE 150 MG PO TABS: 150.0000 mg | ORAL_TABLET | Freq: Once | ORAL | 0 refills | Status: AC

## 2017-07-09 NOTE — Patient Instructions (Signed)
Increase your evening levemir dose to 35 U and increase this dose by 1 unit every evening until your fasting glucose in the morning is in the range of 100-110. For now, continue 32 U levemir every morning.  Stop your atorvastatin.

## 2017-07-09 NOTE — Progress Notes (Signed)
07/10/2017  CC:  Chief Complaint  Patient presents with  . Hospitalization Follow-up    Patient is a 62 y.o. Caucasian female who presents for  hospital follow up. Dates hospitalized: 4/8-4/11, 2019. Days since d/c from hospital: 18 Patient was discharged from hospital to home. Reason for admission to hospital: 1 wk of n/v/d.  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 Surgery subsequently consultedand conservative mgmt was recommended.  Pt began to tolerate bariatric diet and her diarrhea slowed down and she was d/c'd home.  Upon d/c, she was told not to take her mealtime insulin anymore b/c of very erratic eating patterns, and her lantus was dosed at 12 U bid.  A1c at that time was 11%. However, today she states she is taking lantus 32 U bid. Home glucoses: "they are coming down".  Highest has been 345 in last couple weeks.  Lowest fasting has been 189. Not eating consistently yet--eating lunch and supper daily now.  She cannot give an account of her glucoses that makes enough sense for me to recommend any sensible changes. She c/o having itchy, thick/globular, white vaginal d/c.  Minimal improvement with multiple attempts at otc treatment in last couple weeks.  I have reviewed patient's discharge summary plus pertinent specific notes, labs, and imaging from the hospitalization.    Diarrhea much improved, just comes and goes. Assoc with some L sided abd cramping.  Nausea some, but no vomiting. She lost a lot of wt before and during hospitalization due to fluid loss.  Medication reconciliation was done today and patient is taking meds as recommended by discharging hospitalist/specialist except for her lantus as noted above.  PMH:  Past Medical History:  Diagnosis Date  . Abnormal liver diagnostic imaging 05/20/2017  . Abnormal  mammogram 12/2014   F/u diagnostic mammo and u/s NORMAL--resume annual screening mammography  . Arthritis   . Bipolar disorder ([place])    questionable  . Cervical radiculopathy    cervical spondylosis on 2013 MRI.  Dr. 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 Trenton Gammon then did posterior cervical fusion.  . Chronic fatigue   . Chronic nausea 2012/13   Normal upper GI  12/2011 (mild GERD noted +small hiatal hernia)+  . Chronic pain syndrome    Chronic C spine pain with radiculopathy.  [facility] Francesco Runner at [facility] was her pain mgmt MD and he discharged her from [facility] pain mgmt center b/c she failed a UDS (+marijuana) 04/27/15  . Chronic renal insufficiency, stage II (mild) 02/2015   GFR 70s  . Colon cancer screening 03/06/2011   iFob 11/2010 through her insurer was negative. iFOB 01/2015 NEG.  . Depression   . Diabetes mellitus    urine protein-Cr ratio nl 09/2010, D.R. Screen neg 10/2009  . Fibromyalgia   . Fracture of rib of left side 08/14/15   5th and 6th  . Hepatic steatosis 2005   ultrasound  . History of kidney stones   . 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 Hyperlipidemia, mixed 2017   Waiting to start statin (trying to take one thing at a time with her, working on glucose control first)  . Insomnia   . Iron deficiency anemia 09/2009   malabsorbtion s/p bariatric surgery  . Nephrolithiasis 11/2012  . Neurosis, anxiety, panic  type   . Obesity    s/p bariatric surgery  . Osteopenia 12/2010   Hip T score -2.0.  Spine T score -0.5 (plan to repeat in 2 yrs)  . Other B-complex deficiencies   . Panic attacks   . PONV (postoperative nausea and vomiting)   . Restless legs syndrome   . Syncopal episodes    7/73/17 in the setting of dehydration and sedative medication  . Tobacco dependence   . Vitamin D deficiency 09/2009  . Wears dentures    uppers and lowers  . Wears glasses     PSH:   Past Surgical History:  Procedure Laterality Date  . ABDOMINAL HYSTERECTOMY     fibroids, ovaries remain  . APPENDECTOMY    . BACK SURGERY    . BLADDER SURGERY     bladder tack  . CERVICAL SPINE SURGERY  09/2009; 2014   2014-ant cerv decomp (Dr. Carloyn Manner)  . CHOLECYSTECTOMY  04/2010   w/lysis of adhesions (open procedure)--path showed chronic cholecystitis and cholesterol polyp.  . CYSTOSCOPY/RETROGRADE/URETEROSCOPY/STONE EXTRACTION WITH BASKET  11/2012   Left UVJ stone  . CYSTOSCOPY/RETROGRADE/URETEROSCOPY/STONE EXTRACTION WITH BASKET Left 11/21/2012   Procedure: CYSTOSCOPY/RETROGRADE/URETEROSCOPY/STONE EXTRACTION WITH BASKET insertion double j stent;  Surgeon: Ailene Rud, MD;  Location: [facility];  Service: Urology;  Laterality: Left;  . EPIDURAL BLOCK INJECTION  03/2011   Cervical  . HERNIA REPAIR  2005   mesenteric hernia repair, with lysis of adhesions (presented with SBO)  . POSTERIOR CERVICAL FUSION/FORAMINOTOMY N/A 12/31/2016   Procedure: Posterior Cervical Fusion with lateral mass fixation - Cervical four-five, Cervical five-six;  Surgeon: Earnie Larsson, MD;  Location: [facility];  Service: Neurosurgery;  Laterality: N/A;  . ROUX-EN-Y GASTRIC BYPASS    . TONSILLECTOMY    . TONSILLECTOMY      MEDS:  Outpatient Medications Prior to Visit  Medication Sig Dispense Refill  . calcium elemental as carbonate (TUMS ULTRA 1000) 400 MG chewable tablet Chew 1,000 mg by mouth daily.      . Cholecalciferol 2000 UNITS CAPS Take 1 capsule (2,000 Units total) by mouth daily. 30 each 11  . HYDROcodone-acetaminophen (NORCO) 10-325 MG tablet Take 1-1.5 tablets by mouth every 4 (four) hours as needed for moderate pain or severe pain.    Marland Kitchen amphetamine-dextroamphetamine (ADDERALL) 10 MG tablet 1 tab po bid 60 tablet 0  . atorvastatin (LIPITOR) 40 MG tablet TAKE (1) TABLET BY MOUTH AT BEDTIME. 90 tablet 0  . DULoxetine (CYMBALTA) 30 MG capsule TAKE (1) CAPSULE BY MOUTH TWICE DAILY. (Patient taking  differently: TAKE 30 MG BY MOUTH TWICE DAILY.) 60 capsule 5  . gabapentin (NEURONTIN) 300 MG capsule TAKE 4 CAPSULES BY MOUTH AT BEDTIME. (Patient taking differently: TAKE 1200 MG BY MOUTH AT BEDTIME.) 360 capsule 3  . insulin detemir (LEVEMIR) 100 UNIT/ML injection Inject 0.12 mLs (12 Units total) into the skin 2 (two) times daily. 10 mL 11  . lamoTRIgine (LAMICTAL) 100 MG tablet TAKE 1 TABLET BY MOUTH TWICE DAILY. (Patient taking differently: TAKE 100 MG BY MOUTH TWICE DAILY.) 60 tablet 6  . mirtazapine (REMERON) 30 MG tablet TAKE (1) TABLET BY MOUTH AT BEDTIME. 90 tablet 1  . ondansetron (ZOFRAN) 4 MG tablet Take 1 tablet (4 mg total) by mouth every 6 (six) hours as needed for nausea. 20 tablet 0  . tiZANidine (ZANAFLEX) 4 MG tablet Take 4 mg by mouth 2 (two) times daily.    Marland Kitchen LORazepam (ATIVAN) 2 MG tablet TAKE (1) TABLET BY MOUTH EVERY  EIGHT HOURS AS NEEDED. (Patient not taking: Reported on 07/09/2017) 90 tablet 2   No facility-administered medications prior to visit.    Review of Systems  Constitutional: Positive for fatigue. Negative for fever.  HENT: Negative for congestion, mouth sores and sore throat.   Eyes: Negative for visual disturbance.  Respiratory: Negative for cough, shortness of breath and wheezing.   Cardiovascular: Negative for chest pain and leg swelling.  Gastrointestinal: Positive for abdominal pain (intermittent, mild, brief) and nausea. Negative for abdominal distention and vomiting.  Genitourinary: Negative for dysuria.  Musculoskeletal: Positive for arthralgias (chron neck pain). Negative for back pain and joint swelling.  Skin: Negative for rash.  Neurological: Negative for tremors, syncope, weakness and headaches.  Hematological: Negative for adenopathy.  Psychiatric/Behavioral: The patient is nervous/anxious.     EXAM: BP 101/66 (BP Location: Right Arm, Patient Position: Sitting, Cuff Size: Normal)   Pulse 95   Temp 98.3 F (36.8 C) (Oral)   Resp 16   Ht  5' 2"  (1.575 m)   Wt 178 lb 4 oz (80.9 kg)   SpO2 95%   BMI 32.60 kg/m  Gen: Alert, well appearing.  Patient is oriented to person, place, time, and situation. Pale, as per her usual.  No jaundice. IPJ:ASNK: no injection, icteris, swelling, or exudate.  EOMI, PERRLA. Mouth: lips without lesion/swelling.  Oral mucosa pink and moist. Oropharynx without erythema, exudate, or swelling.  CV: RRR, no m/r/g.   LUNGS: CTA bilat, nonlabored resps, good aeration in all lung fields. ABD: soft, nondistended, with mild/mod TTP in LUQ>L mid abd>LLQ.  No guarding or rebound. No HSM, mass, or bruit.  BS normal. EXT: no clubbing, cyanosis, or edema.    Pertinent labs/imaging  CT abd/pelv w/contrast on 05/19/17:  EXAM: CT ABDOMEN AND PELVIS WITH CONTRAST  TECHNIQUE: Multidetector CT imaging of the abdomen and pelvis was performed using the standard protocol following bolus administration of intravenous contrast.  CONTRAST:  14m ISOVUE-300 IOPAMIDOL (ISOVUE-300) INJECTION 61%  COMPARISON:  08/14/2015 CT  FINDINGS: Lower chest: Top-normal size heart without pericardial effusion. Minimal subsegmental atelectasis in the lingula. No effusion or pneumothorax.  Hepatobiliary: Hepatomegaly with morphologic changes of cirrhosis. Subcapsular areas of hypodensity along the right and left hepatic lobes may reflect old remote subcapsular areas of trauma/hemorrhage or potentially infarcts. Subtle tubular areas of hypodensity in the right hepatic lobe may reflect chronic posttraumatic change//aerations versus biliary dilatation. No enhancing masses. Scattered subcentimeter rounded hypodensities are also noted statistically consistent with cysts or hemangiomata. Small foci of infection/abscess believed less likely. Status post cholecystectomy.  Pancreas: Normal without ductal dilatation or mass.  Spleen: Calcified granuloma in the spleen.  No splenomegaly  Adrenals/Urinary Tract: Normal  bilateral adrenal glands. Symmetric cortical enhancement of both kidneys without nephrolithiasis nor obstructive uropathy. Bladder is unremarkable for degree of distention.  Stomach/Bowel: Status post Roux-en-Y gastric bypass. No dehiscence across the staple line. Stenotic appearing jejunojejunostomy site with more proximal dilatation of jejunum and fecalized material within. Status post appendectomy. Unremarkable colon.  Vascular/Lymphatic: Mild aortoiliac atherosclerosis. No aneurysm. No adenopathy.  Reproductive: Hysterectomy.  No adnexal mass.  Other: No free air nor free fluid  Musculoskeletal: No acute nor suspicious osseous abnormality.  IMPRESSION: 1. 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 2. Morphologic appearance of cirrhosis involving the liver. New subcapsular areas of hypodensity involving the right and left hepatic lobes may represent stigmata of  old remote subcapsular hematoma and trauma. Subtle linear lucencies also noted in the right hepatic lobe that may also reflect old posttraumatic change. The possibility of mild intrahepatic biliary dilatation is not entirely excluded. Scattered small hypodensities too small to further characterize are also seen scattered within the liver. Statistically these may represent small cysts or hemangiomata. Small foci of infection not entirely excluded but believed less likely. Nonemergent MRI may help for further correlation.  Lab Results  Component Value Date   LIPASE 22 05/19/2017    Lab Results  Component Value Date   TSH 1.85 10/11/2016   Lab Results  Component Value Date   WBC 6.2 05/22/2017   HGB 11.1 (L) 05/22/2017   HCT 33.4 (L) 05/22/2017   MCV 93.3 05/22/2017   PLT 303 05/22/2017   Lab Results  Component Value Date   IRON 37 07/09/2017   TIBC 403 07/09/2017    FERRITIN 14 08/08/2015   Lab Results  Component Value Date   VITAMINB12 638 11/13/2011    Lab Results  Component Value Date   CREATININE 0.83 05/22/2017   BUN <5 (L) 05/22/2017   NA 143 05/22/2017   K 3.9 05/22/2017   CL 109 05/22/2017   CO2 25 05/22/2017   Lab Results  Component Value Date   ALT 15 05/22/2017   AST 18 05/22/2017   ALKPHOS 68 05/22/2017   BILITOT 0.3 05/22/2017   Lab Results  Component Value Date   CHOL 160 10/11/2016   Lab Results  Component Value Date   HDL 41.50 10/11/2016   Lab Results  Component Value Date   LDLCALC 95 10/11/2016   Lab Results  Component Value Date   TRIG 120.0 10/11/2016   Lab Results  Component Value Date   CHOLHDL 4 10/11/2016   Lab Results  Component Value Date   HGBA1C 11.0 (H) 05/19/2017    ASSESSMENT/PLAN:  1) Viral GE, resolved.  Still with some mild postinfectious malabsorption diarrhea. MR abd w/out and with contrast to further eval cirrhotic changes and indeterminate areas of linear and rounded hypodensities. Check CBC, CMET, ferritin,iron,TIBC, vit B12, Hep B surface antigen, and Hep C antibody. GI referral after results of MRI come in. Stop atorvastatin until further rec's from GI. The tylenol in the norco that Dr. Trenton Gammon is rx'ing is fine at this time.  2) Malabsorption syndrome s/p roux-en-Y gastric bipass surgery: see labs listed above.  3) Diabetes: poor control chronically. Erratic eating habits, periods of noncompliance with insulin. Will keep her off of her mealtime insulin at this time---until she proves she can maintain consistent eating/snacking habits.  Will continue 32 U levemir qAM and increase hs levemir to 35 U, with instructions to increase hs dose by 1 unit per day until fasting glucose in range of 100-110. We discussed the likelihood of getting her to an endocrinologist for management of her DM in near future, but she wants to get through some of the issue with her liver right now. Plan  on repeat A1c after 08/18/17.  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  I did RF adderall x 1 mo supply and alprazolam for 30d supply with 5 RFs today.  5) Yeast vaginitis: diflucan 165m x 1 dose, RF x 1.  Spent 45 min with pt today, with >50% of this time spent in counseling and care coordination regarding the above problems.  An After Visit Summary was printed and given to the  patient.  FOLLOW UP:  1 mo  Signed:  Crissie Sickles, MD           07/10/2017

## 2017-07-10 LAB — IRON AND TIBC
IRON SATURATION: 9 % — AB (ref 15–55)
IRON: 37 ug/dL (ref 27–159)
Total Iron Binding Capacity: 403 ug/dL (ref 250–450)
UIBC: 366 ug/dL (ref 131–425)

## 2017-07-10 LAB — FERRITIN: Ferritin: 32.9 ng/mL (ref 10.0–291.0)

## 2017-07-10 LAB — COMPREHENSIVE METABOLIC PANEL
ALT: 36 U/L — AB (ref 0–35)
AST: 38 U/L — ABNORMAL HIGH (ref 0–37)
Albumin: 4.8 g/dL (ref 3.5–5.2)
Alkaline Phosphatase: 111 U/L (ref 39–117)
BILIRUBIN TOTAL: 0.5 mg/dL (ref 0.2–1.2)
BUN: 18 mg/dL (ref 6–23)
CALCIUM: 10 mg/dL (ref 8.4–10.5)
CO2: 28 meq/L (ref 19–32)
CREATININE: 0.91 mg/dL (ref 0.40–1.20)
Chloride: 100 mEq/L (ref 96–112)
GFR: 66.91 mL/min (ref >60.00)
GLUCOSE: 238 mg/dL — AB (ref 70–99)
Potassium: 4.3 mEq/L (ref 3.5–5.1)
Sodium: 138 mEq/L (ref 135–145)
TOTAL PROTEIN: 8.6 g/dL — AB (ref 6.0–8.3)

## 2017-07-10 LAB — VITAMIN B12: Vitamin B-12: 493 pg/mL (ref 211–911)

## 2017-07-10 LAB — HEPATITIS C ANTIBODY
HEP C AB: NONREACTIVE
SIGNAL TO CUT-OFF: 0.2 (ref <1.00)

## 2017-07-10 LAB — IRON: IRON: 33 ug/dL — AB (ref 42–145)

## 2017-07-10 LAB — CBC
HCT: 45 % (ref 36.0–46.0)
HEMOGLOBIN: 14.6 g/dL (ref 12.0–15.0)
MCHC: 32.5 g/dL (ref 30.0–36.0)
MCV: 95.5 fl (ref 78.0–100.0)
PLATELETS: 284 10*3/uL (ref 150.0–400.0)
RBC: 4.71 Mil/uL (ref 3.87–5.11)
RDW: 14.1 % (ref 11.5–15.5)
WBC: 8.2 10*3/uL (ref 4.0–10.5)

## 2017-07-10 LAB — HEPATITIS B SURFACE ANTIGEN: Hepatitis B Surface Ag: NONREACTIVE

## 2017-07-17 ENCOUNTER — Ambulatory Visit (HOSPITAL_COMMUNITY)
Admission: RE | Admit: 2017-07-17 | Discharge: 2017-07-17 | Disposition: A | Discharge status: Home / Self Care | Payer: Medicare HMO | Source: Ambulatory Visit | Attending: Family Medicine | Admitting: Family Medicine

## 2017-07-17 DIAGNOSIS — K760 Fatty (change of) liver, not elsewhere classified: Secondary | ICD-10-CM

## 2017-07-17 DIAGNOSIS — R109 Unspecified abdominal pain: Secondary | ICD-10-CM | POA: Diagnosis not present

## 2017-07-17 DIAGNOSIS — I700 Atherosclerosis of aorta: Secondary | ICD-10-CM | POA: Insufficient documentation

## 2017-07-17 MED ORDER — GADOBENATE DIMEGLUMINE 529 MG/ML IV SOLN
15.0000 mL | Freq: Once | INTRAVENOUS | Status: AC | PRN
  Administered 2017-07-17: 15 mL via INTRAVENOUS

## 2017-07-21 ENCOUNTER — Encounter: Payer: Self-pay | Admitting: Family Medicine

## 2017-07-28 ENCOUNTER — Other Ambulatory Visit: Payer: Self-pay | Admitting: Family Medicine

## 2017-07-28 NOTE — Telephone Encounter (Signed)
Not on medication list.  RF request for vitamin b12 LOV: 07/09/17 Next ov: 08/06/17 Last written: 10/20/14 #40m w/ 6RF  Please advise. Thanks.

## 2017-08-06 ENCOUNTER — Ambulatory Visit: Payer: Medicare HMO | Admitting: Family Medicine

## 2017-08-07 ENCOUNTER — Other Ambulatory Visit: Payer: Self-pay | Admitting: Family Medicine

## 2017-08-23 ENCOUNTER — Other Ambulatory Visit: Payer: Self-pay | Admitting: Family Medicine

## 2017-08-26 ENCOUNTER — Other Ambulatory Visit: Payer: Self-pay | Admitting: Family Medicine

## 2017-08-27 ENCOUNTER — Ambulatory Visit: Payer: Medicare HMO | Admitting: Family Medicine

## 2017-09-24 ENCOUNTER — Ambulatory Visit: Payer: Medicare HMO | Admitting: Family Medicine

## 2017-09-24 ENCOUNTER — Encounter: Payer: Self-pay | Admitting: Family Medicine

## 2017-09-24 VITALS — BP 132/85 | HR 98 | Temp 98.2°F | Resp 16 | Ht 62.0 in | Wt 184.1 lb

## 2017-09-24 DIAGNOSIS — F411 Generalized anxiety disorder: Secondary | ICD-10-CM

## 2017-09-24 DIAGNOSIS — F988 Other specified behavioral and emotional disorders with onset usually occurring in childhood and adolescence: Secondary | ICD-10-CM

## 2017-09-24 DIAGNOSIS — E118 Type 2 diabetes mellitus with unspecified complications: Secondary | ICD-10-CM | POA: Diagnosis not present

## 2017-09-24 DIAGNOSIS — K7581 Nonalcoholic steatohepatitis (NASH): Secondary | ICD-10-CM

## 2017-09-24 DIAGNOSIS — Z794 Long term (current) use of insulin: Secondary | ICD-10-CM | POA: Diagnosis not present

## 2017-09-24 DIAGNOSIS — S129XXA Fracture of neck, unspecified, initial encounter: Secondary | ICD-10-CM | POA: Diagnosis not present

## 2017-09-24 DIAGNOSIS — E611 Iron deficiency: Secondary | ICD-10-CM

## 2017-09-24 DIAGNOSIS — K9089 Other intestinal malabsorption: Secondary | ICD-10-CM | POA: Diagnosis not present

## 2017-09-24 DIAGNOSIS — E669 Obesity, unspecified: Secondary | ICD-10-CM | POA: Diagnosis not present

## 2017-09-24 DIAGNOSIS — Z6832 Body mass index (BMI) 32.0-32.9, adult: Secondary | ICD-10-CM | POA: Diagnosis not present

## 2017-09-24 DIAGNOSIS — L658 Other specified nonscarring hair loss: Secondary | ICD-10-CM

## 2017-09-24 DIAGNOSIS — M65841 Other synovitis and tenosynovitis, right hand: Secondary | ICD-10-CM | POA: Diagnosis not present

## 2017-09-24 LAB — COMPREHENSIVE METABOLIC PANEL
ALT: 27 U/L (ref 0–35)
AST: 30 U/L (ref 0–37)
Albumin: 4.3 g/dL (ref 3.5–5.2)
Alkaline Phosphatase: 104 U/L (ref 39–117)
BUN: 17 mg/dL (ref 6–23)
CHLORIDE: 102 meq/L (ref 96–112)
CO2: 30 meq/L (ref 19–32)
Calcium: 10.3 mg/dL (ref 8.4–10.5)
Creatinine, Ser: 0.84 mg/dL (ref 0.40–1.20)
GFR: 73.33 mL/min (ref >60.00)
Glucose, Bld: 242 mg/dL — ABNORMAL HIGH (ref 70–99)
POTASSIUM: 4.4 meq/L (ref 3.5–5.1)
Sodium: 138 mEq/L (ref 135–145)
Total Bilirubin: 0.4 mg/dL (ref 0.2–1.2)
Total Protein: 7.7 g/dL (ref 6.0–8.3)

## 2017-09-24 LAB — CBC WITH DIFFERENTIAL/PLATELET
Basophils Absolute: 0 10*3/uL (ref 0.0–0.1)
Basophils Relative: 0.5 % (ref 0.0–3.0)
EOS PCT: 2.5 % (ref 0.0–5.0)
Eosinophils Absolute: 0.2 10*3/uL (ref 0.0–0.7)
HCT: 39.5 % (ref 36.0–46.0)
Hemoglobin: 13.4 g/dL (ref 12.0–15.0)
Lymphocytes Relative: 30.2 % (ref 12.0–46.0)
Lymphs Abs: 2.1 10*3/uL (ref 0.7–4.0)
MCHC: 33.9 g/dL (ref 30.0–36.0)
MCV: 95.2 fl (ref 78.0–100.0)
Monocytes Absolute: 0.4 10*3/uL (ref 0.1–1.0)
Monocytes Relative: 5.3 % (ref 3.0–12.0)
NEUTROS ABS: 4.3 10*3/uL (ref 1.4–7.7)
Neutrophils Relative %: 61.5 % (ref 43.0–77.0)
PLATELETS: 301 10*3/uL (ref 150.0–400.0)
RBC: 4.15 Mil/uL (ref 3.87–5.11)
RDW: 14.1 % (ref 11.5–15.5)
WBC: 7 10*3/uL (ref 4.0–10.5)

## 2017-09-24 LAB — FERRITIN: Ferritin: 41.3 ng/mL (ref 10.0–291.0)

## 2017-09-24 LAB — IRON: Iron: 74 ug/dL (ref 42–145)

## 2017-09-24 LAB — HEMOGLOBIN A1C: Hgb A1c MFr Bld: 9.5 % — ABNORMAL HIGH (ref 4.6–6.5)

## 2017-09-24 MED ORDER — INSULIN DETEMIR 100 UNIT/ML ~~LOC~~ SOLN: SUBCUTANEOUS | 6 refills | Status: DC

## 2017-09-24 MED ORDER — PEN NEEDLES 31G X 8 MM MISC: 11 refills | Status: DC

## 2017-09-24 NOTE — Progress Notes (Signed)
OFFICE VISIT  09/24/2017   CC:  Chief Complaint  Patient presents with  . Follow-up    RCI, pt is not fasting.   HPI:    Patient is a 62 y.o. 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 Also has adult ADD, on 37m adderall bid, but she only takes 1/2 tab bid.  This is due to inability to pay for this med until Sept this year.  DM: levemir 35 U qAM and 45 U qPM. Eats 2-3 meals per day. Fasting glucs around 200.  Later in the day she is 100-150. Has made a couple of small changes in diet to avoid simple carbs. Describes voracious appetite at night.  Goes on things like ice cream binges! Not able to exercise due to chronic pain.  Anxiety: no signif change.  Still taking loraz 1 mg tid regularly.  Iron def: has been taking FeSO4 3254mqd the last 3 mo or so.  About 1 yr hx of painful motion of R middle finger, feels decreased ability to smoothly flex/extend it, occ locks briefly in flexed position.  ROS: no CP, no SOB, no wheezing, no cough, no dizziness, no HAs, no rashes, no melena/hematochezia.  No polyuria or polydipsia.  No myalgias or arthralgias.  She is having 3 loose BMs qd since having a GI illness this spring. Has chronic neck pain managed by Dr. PoAnnette StableHair falling out--chronic, but worse lately.  Past Medical History:  Diagnosis Date  . Abnormal liver diagnostic imaging 05/20/2017  . Abnormal mammogram 12/2014   F/u diagnostic mammo and u/s NORMAL--resume annual screening mammography  . Arthritis   . Bipolar disorder ([place]   questionable  . Cervical radiculopathy    cervical spondylosis on 2013 MRI.  Dr. 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 PoTrenton Gammonhen did posterior cervical fusion.  . Chronic fatigue   . Chronic nausea 2012/13   Normal upper GI  12/2011  (mild GERD noted +small hiatal hernia)+  . Chronic pain syndrome    Chronic C spine pain with radiculopathy.  [facility]/c she failed a UDS (+marijuana) 04/27/15  . Chronic renal insufficiency, stage II (mild) 02/2015   GFR 70s  . Colon cancer screening 03/06/2011   iFob 11/2010 through her insurer was negative. iFOB 01/2015 NEG.  . Depression   . Diabetes mellitus    urine protein-Cr ratio nl 09/2010, D.R. Screen neg 10/2009  . Fibromyalgia   . Fracture of rib of left side 08/14/15   5th and 6th  . Hepatic steatosis 2005   ultrasound.  Also, 07/2017 MRI abd--> plus a few focal areas of more severe steatosis.  . Marland Kitchenistory of kidney stones   . 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 Hyperlipidemia, mixed 2017   Waiting to start statin (trying to take one thing at a time with her, working on glucose control first)  . Insomnia   . Iron deficiency anemia 09/2009   malabsorbtion s/p bariatric surgery  . Nephrolithiasis 11/2012  . Neurosis, anxiety, panic type   . Obesity    s/p bariatric surgery  . Osteopenia 12/2010   Hip T score -2.0.  Spine T score -0.5 (plan to repeat in 2  yrs)  . Other B-complex deficiencies   . Panic attacks   . PONV (postoperative nausea and vomiting)   . Restless legs syndrome   . Syncopal episodes    7/73/17 in the setting of dehydration and sedative medication  . Tobacco dependence   . Vitamin D deficiency 09/2009  . Wears dentures    uppers and lowers  . Wears glasses     Past Surgical History:  Procedure Laterality Date  . ABDOMINAL HYSTERECTOMY     fibroids, ovaries remain  . APPENDECTOMY    . BACK SURGERY    . BLADDER SURGERY     bladder tack  . CERVICAL SPINE SURGERY  09/2009; 2014   2014-ant cerv decomp (Dr. Carloyn Manner)  . CHOLECYSTECTOMY  04/2010   w/lysis of adhesions (open procedure)--path showed chronic cholecystitis and cholesterol polyp.  .  CYSTOSCOPY/RETROGRADE/URETEROSCOPY/STONE EXTRACTION WITH BASKET  11/2012   Left UVJ stone  . CYSTOSCOPY/RETROGRADE/URETEROSCOPY/STONE EXTRACTION WITH BASKET Left 11/21/2012   Procedure: CYSTOSCOPY/RETROGRADE/URETEROSCOPY/STONE EXTRACTION WITH BASKET insertion double j stent;  Surgeon: Ailene Rud, MD;  Location: [facility];  Service: Urology;  Laterality: Left;  . EPIDURAL BLOCK INJECTION  03/2011   Cervical  . HERNIA REPAIR  2005   mesenteric hernia repair, with lysis of adhesions (presented with SBO)  . POSTERIOR CERVICAL FUSION/FORAMINOTOMY N/A 12/31/2016   Procedure: Posterior Cervical Fusion with lateral mass fixation - Cervical four-five, Cervical five-six;  Surgeon: Earnie Larsson, MD;  Location: [facility];  Service: Neurosurgery;  Laterality: N/A;  . ROUX-EN-Y GASTRIC BYPASS    . TONSILLECTOMY    . TONSILLECTOMY      Outpatient Medications Prior to Visit  Medication Sig Dispense Refill  . amphetamine-dextroamphetamine (ADDERALL) 10 MG tablet 1 tab po bid (Patient taking differently: 5 mg 2 (two) times daily with a meal. 1 tab po bid) 60 tablet 0  . calcium elemental as carbonate (TUMS ULTRA 1000) 400 MG chewable tablet Chew 1,000 mg by mouth daily.      . Cholecalciferol 2000 UNITS CAPS Take 1 capsule (2,000 Units total) by mouth daily. 30 each 11  . cyanocobalamin (,VITAMIN B-12,) 1000 MCG/ML injection INJECT 1ML INTO THE MUSCLE EVERY 14 DAYS. 2 mL 11  . DULoxetine (CYMBALTA) 30 MG capsule TAKE (1) CAPSULE BY MOUTH TWICE DAILY. 60 capsule 6  . gabapentin (NEURONTIN) 300 MG capsule TAKE 4 CAPSULES BY MOUTH AT BEDTIME. 120 capsule 6  . HYDROcodone-acetaminophen (NORCO) 10-325 MG tablet Take 1-1.5 tablets by mouth every 4 (four) hours as needed for moderate pain or severe pain.    Marland Kitchen lamoTRIgine (LAMICTAL) 100 MG tablet Take 1 tablet (100 mg total) by mouth 2 (two) times daily. 60 tablet 6  . LORazepam (ATIVAN) 2 MG tablet TAKE (1) TABLET BY MOUTH EVERY EIGHT HOURS AS NEEDED. 90 tablet 5   . mirtazapine (REMERON) 30 MG tablet TAKE (1) TABLET BY MOUTH AT BEDTIME. 30 tablet 6  . ondansetron (ZOFRAN) 4 MG tablet Take 1 tablet (4 mg total) by mouth every 6 (six) hours as needed for nausea. 20 tablet 6  . tiZANidine (ZANAFLEX) 4 MG tablet Take 1 tablet (4 mg total) by mouth 2 (two) times daily. 60 tablet 6  . insulin detemir (LEVEMIR) 100 UNIT/ML injection Inject 32 units in the AM and 35 units in the PM (Patient taking differently: Inject 35units in the AM and 45units in the PM) 20 mL 6  . Insulin Pen Needle (PEN NEEDLES) 31G X 8 MM MISC by Does not apply route. Use to  inject insulin BID    . B-D UF III MINI PEN NEEDLES 31G X 5 MM MISC USE AS DIRECTED UP TO 4 TIMES DAILY. (Patient not taking: Reported on 09/24/2017) 100 each 11   No facility-administered medications prior to visit.     Allergies  Allergen Reactions  . Aspirin Other (See Comments)    Gastric Bypass  . Latex Other (See Comments)    blisters  . Penicillins     UNSPECIFIED REACTION >"Took large quantities as a child & told to never take again Has patient had a PCN reaction causing immediate rash, facial/tongue/throat swelling, SOB or lightheadedness with hypotension: No Has patient had a PCN reaction causing severe rash involving mucus membranes or skin necrosis: No Has patient had a PCN reaction that required hospitalization: No Has patient had a PCN reaction occurring within the last 10 years: No If all of the above answers are "NO", then may proceed wi  . Codeine Itching    ROS As per HPI  PE: Blood pressure 132/85, pulse 98, temperature 98.2 F (36.8 C), temperature source Oral, resp. rate 16, height 5' 2"  (1.575 m), weight 184 lb 2 oz (83.5 kg), SpO2 96 %. Body mass index is 33.68 kg/m.  Gen: Alert, well appearing.  Patient is oriented to person, place, time, and situation. AFFECT: pleasant, lucid thought and speech. She looks significantly pale, as per her usual. CV: RRR, no m/r/g.   LUNGS: CTA  bilat, nonlabored resps, good aeration in all lung fields. EXT: no clubbing, cyanosis, or edema.  R hand 3rd finger ROM intact but painful with flexion and extension.  Some mild hesitancy with extension but no "catch".  Tender focal point on palmar surface of 3rd MCP joint.  No sign of tendon contracture.  LABS:  Lab Results  Component Value Date   TSH 1.85 10/11/2016   Lab Results  Component Value Date   WBC 7.0 09/24/2017   HGB 13.4 09/24/2017   HCT 39.5 09/24/2017   MCV 95.2 09/24/2017   PLT 301.0 09/24/2017   Lab Results  Component Value Date   CREATININE 0.84 09/24/2017   BUN 17 09/24/2017   NA 138 09/24/2017   K 4.4 09/24/2017   CL 102 09/24/2017   CO2 30 09/24/2017   Lab Results  Component Value Date   ALT 27 09/24/2017   AST 30 09/24/2017   ALKPHOS 104 09/24/2017   BILITOT 0.4 09/24/2017   Lab Results  Component Value Date   CHOL 160 10/11/2016   Lab Results  Component Value Date   HDL 41.50 10/11/2016   Lab Results  Component Value Date   LDLCALC 95 10/11/2016   Lab Results  Component Value Date   TRIG 120.0 10/11/2016   Lab Results  Component Value Date   CHOLHDL 4 10/11/2016   Lab Results  Component Value Date   HGBA1C 9.5 (H) 09/24/2017    IMPRESSION AND PLAN:  1) DM 2: control sounds like it is improving. HbA1c today.  2) Chronic anxiety: stable on ativan 33m tid. No new rx needed for this today.  3) Hepatic steatosis/NASH: recheck LFTs today. If stable, will restart her statin.  4) Adult ADD: she is taking 1/2 her usual dosing of adderall in order to stretch it out--for financial reasons. No new rx for this given today.  5) Iron deficiency, w/out anemia: due to malabsorption from her roux-en-Y gastric bipass. Has been consistently taking iron 3240mqd x 3 mo. Recheck cbc with iron labs today.  6) R 3rd finger stenosing tenosynovitis of flexor tendon, mild trigger finger. Pt declines ortho referral at this time.  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 Iron def could contribute: rechecking iron levels today. Stop biotin-->check thyroid labs next o/v.  8) Hx of vit D def: needs to take vit D 2000 U DAILY instead of qod-->check vit D level next o/v.  An After Visit Summary was printed and given to the patient.  FOLLOW UP: Return in about 3 months (around 12/25/2017) for routine chronic illness f/u.  Signed:  Crissie Sickles, MD           09/24/2017

## 2017-09-25 ENCOUNTER — Other Ambulatory Visit: Payer: Self-pay | Admitting: *Deleted

## 2017-09-25 LAB — IRON AND TIBC
Iron Saturation: 18 % (ref 15–55)
Iron: 62 ug/dL (ref 27–159)
Total Iron Binding Capacity: 342 ug/dL (ref 250–450)
UIBC: 280 ug/dL (ref 131–425)

## 2017-09-25 MED ORDER — INSULIN DETEMIR 100 UNIT/ML ~~LOC~~ SOLN: SUBCUTANEOUS | 3 refills | Status: DC

## 2017-09-29 ENCOUNTER — Encounter: Payer: Self-pay | Admitting: Family Medicine

## 2017-10-20 ENCOUNTER — Other Ambulatory Visit: Payer: Self-pay | Admitting: Family Medicine

## 2017-10-22 ENCOUNTER — Other Ambulatory Visit: Payer: Self-pay | Admitting: Family Medicine

## 2017-10-23 ENCOUNTER — Telehealth: Payer: Self-pay | Admitting: Family Medicine

## 2017-10-23 MED ORDER — INSULIN ASPART 100 UNIT/ML FLEXPEN: PEN_INJECTOR | SUBCUTANEOUS | 5 refills | Status: DC

## 2017-10-23 NOTE — Telephone Encounter (Signed)
Novolog eRx'd. I did not intend for her to stop it. I guess I accidentally took it off her med list (??). -thx

## 2017-10-23 NOTE — Telephone Encounter (Signed)
SW pt and advised her that we had stopped the Novolog at her visit in May. She stated that she was not told this and has been taking 18units w/ every meal if her BS is >100.   Pt is wanting to know if she should continue taking Novolog. If so she needs a refill. Please advise. Thanks.

## 2017-10-23 NOTE — Telephone Encounter (Signed)
Copied from [place] [id number]. Topic: General - Other >> Oct 23, 2017  1:15 PM Cecelia Byars, NT wrote: Reason for CRM: Patient called and is wanting to know why her insulin is not being filled please call her at [phone]

## 2017-10-24 NOTE — Telephone Encounter (Signed)
Pt advised and voiced understanding.   

## 2017-12-22 ENCOUNTER — Ambulatory Visit: Payer: Self-pay | Admitting: Family Medicine

## 2017-12-25 DIAGNOSIS — M542 Cervicalgia: Secondary | ICD-10-CM | POA: Diagnosis not present

## 2017-12-25 DIAGNOSIS — Z6832 Body mass index (BMI) 32.0-32.9, adult: Secondary | ICD-10-CM | POA: Diagnosis not present

## 2017-12-25 DIAGNOSIS — S129XXA Fracture of neck, unspecified, initial encounter: Secondary | ICD-10-CM | POA: Diagnosis not present

## 2017-12-29 ENCOUNTER — Encounter: Payer: Self-pay | Admitting: Family Medicine

## 2018-01-16 ENCOUNTER — Other Ambulatory Visit: Payer: Self-pay | Admitting: Family Medicine

## 2018-01-30 ENCOUNTER — Other Ambulatory Visit: Payer: Self-pay | Admitting: Family Medicine

## 2018-01-30 NOTE — Telephone Encounter (Signed)
RF request for lorazepam LOV: 09/24/17  Next ov: None Last written: 07/09/17 #90 w/ 5RF  Please advise. Thanks.

## 2018-02-02 NOTE — Telephone Encounter (Signed)
I'll do #90 with 2 RF's. Needs f/u in late Feb or early March 2020 or I can't rx this med for her anymore.-thx

## 2018-02-27 ENCOUNTER — Other Ambulatory Visit: Payer: Self-pay | Admitting: Family Medicine

## 2018-02-27 DIAGNOSIS — Z79899 Other long term (current) drug therapy: Secondary | ICD-10-CM

## 2018-02-27 DIAGNOSIS — F909 Attention-deficit hyperactivity disorder, unspecified type: Secondary | ICD-10-CM

## 2018-02-27 NOTE — Telephone Encounter (Signed)
Pt is over due for f/u [facility]. Will send in 30 day supply for levemir. Needs office visit for f/u for more refills.   Pt advised and voiced understanding.  Apt made for 04/01/18 at 1:00pm.  Pt stated that she is using SS for her levemir and couldn't tell us what she has been giving herself. Per pt this is what she was told to do by Dr. Anitra Lauth. Per last lab result note pt was to giver 36units in AM and 59units in PM. Please advise on dosing/sig. Thanks.   Pt also requesting Rx for adderall.  Adderall was last written: 07/09/17 #60 w/ 0RF  Pt stated that she has only been taking half tab BID due to cost of medication.  Please advise. Thanks.

## 2018-03-02 MED ORDER — AMPHETAMINE-DEXTROAMPHETAMINE 10 MG PO TABS: ORAL_TABLET | ORAL | 0 refills | Status: DC

## 2018-03-02 NOTE — Telephone Encounter (Signed)
Called and advised pt of dosage for Levemir insulin. Pt verbalized understanding that this IS NOT a sliding scaled and must be taken as directed.

## 2018-03-02 NOTE — Telephone Encounter (Signed)
She should be taking her levemir 36 U qAM and 59 U qhs (NOT sliding scale). Will do 1 Adderall rx but must be seen for f/u prior to any further rx's for this med.

## 2018-04-01 ENCOUNTER — Ambulatory Visit (INDEPENDENT_AMBULATORY_CARE_PROVIDER_SITE_OTHER): Payer: Medicare HMO | Admitting: Family Medicine

## 2018-04-01 ENCOUNTER — Encounter: Payer: Self-pay | Admitting: Family Medicine

## 2018-04-01 VITALS — BP 142/83 | HR 98 | Temp 98.2°F | Resp 16 | Ht 62.0 in | Wt 194.0 lb

## 2018-04-01 DIAGNOSIS — F988 Other specified behavioral and emotional disorders with onset usually occurring in childhood and adolescence: Secondary | ICD-10-CM | POA: Diagnosis not present

## 2018-04-01 DIAGNOSIS — F339 Major depressive disorder, recurrent, unspecified: Secondary | ICD-10-CM | POA: Diagnosis not present

## 2018-04-01 DIAGNOSIS — Z794 Long term (current) use of insulin: Secondary | ICD-10-CM | POA: Diagnosis not present

## 2018-04-01 DIAGNOSIS — S129XXA Fracture of neck, unspecified, initial encounter: Secondary | ICD-10-CM | POA: Diagnosis not present

## 2018-04-01 DIAGNOSIS — Z8639 Personal history of other endocrine, nutritional and metabolic disease: Secondary | ICD-10-CM | POA: Diagnosis not present

## 2018-04-01 DIAGNOSIS — Z79899 Other long term (current) drug therapy: Secondary | ICD-10-CM | POA: Diagnosis not present

## 2018-04-01 DIAGNOSIS — K760 Fatty (change of) liver, not elsewhere classified: Secondary | ICD-10-CM

## 2018-04-01 DIAGNOSIS — Z1239 Encounter for other screening for malignant neoplasm of breast: Secondary | ICD-10-CM

## 2018-04-01 DIAGNOSIS — E7800 Pure hypercholesterolemia, unspecified: Secondary | ICD-10-CM

## 2018-04-01 DIAGNOSIS — E559 Vitamin D deficiency, unspecified: Secondary | ICD-10-CM | POA: Diagnosis not present

## 2018-04-01 DIAGNOSIS — F909 Attention-deficit hyperactivity disorder, unspecified type: Secondary | ICD-10-CM

## 2018-04-01 DIAGNOSIS — E118 Type 2 diabetes mellitus with unspecified complications: Secondary | ICD-10-CM

## 2018-04-01 DIAGNOSIS — F411 Generalized anxiety disorder: Secondary | ICD-10-CM | POA: Diagnosis not present

## 2018-04-01 LAB — MICROALBUMIN / CREATININE URINE RATIO
Creatinine,U: 130.3 mg/dL
Microalb Creat Ratio: 1 mg/g (ref 0.0–30.0)
Microalb, Ur: 1.3 mg/dL (ref 0.0–1.9)

## 2018-04-01 LAB — COMPREHENSIVE METABOLIC PANEL
ALBUMIN: 4.4 g/dL (ref 3.5–5.2)
ALT: 34 U/L (ref 0–35)
AST: 41 U/L — ABNORMAL HIGH (ref 0–37)
Alkaline Phosphatase: 98 U/L (ref 39–117)
BILIRUBIN TOTAL: 0.5 mg/dL (ref 0.2–1.2)
BUN: 13 mg/dL (ref 6–23)
CALCIUM: 9.5 mg/dL (ref 8.4–10.5)
CO2: 30 meq/L (ref 19–32)
CREATININE: 0.88 mg/dL (ref 0.40–1.20)
Chloride: 100 mEq/L (ref 96–112)
GFR: 65.28 mL/min (ref >60.00)
Glucose, Bld: 188 mg/dL — ABNORMAL HIGH (ref 70–99)
Potassium: 4.3 mEq/L (ref 3.5–5.1)
Sodium: 140 mEq/L (ref 135–145)
Total Protein: 7.8 g/dL (ref 6.0–8.3)

## 2018-04-01 LAB — LIPID PANEL
CHOL/HDL RATIO: 6
Cholesterol: 234 mg/dL — ABNORMAL HIGH (ref 0–200)
HDL: 41.6 mg/dL (ref >39.00)
LDL CALC: 162 mg/dL — AB (ref 0–99)
NonHDL: 192.08
Triglycerides: 150 mg/dL — ABNORMAL HIGH (ref 0.0–149.0)
VLDL: 30 mg/dL (ref 0.0–40.0)

## 2018-04-01 LAB — CBC WITH DIFFERENTIAL/PLATELET
Basophils Absolute: 0 10*3/uL (ref 0.0–0.1)
Basophils Relative: 0.6 % (ref 0.0–3.0)
EOS PCT: 2.9 % (ref 0.0–5.0)
Eosinophils Absolute: 0.2 10*3/uL (ref 0.0–0.7)
HCT: 41.3 % (ref 36.0–46.0)
HEMOGLOBIN: 13.9 g/dL (ref 12.0–15.0)
LYMPHS PCT: 31.6 % (ref 12.0–46.0)
Lymphs Abs: 2.3 10*3/uL (ref 0.7–4.0)
MCHC: 33.5 g/dL (ref 30.0–36.0)
MCV: 95.5 fl (ref 78.0–100.0)
MONOS PCT: 4.1 % (ref 3.0–12.0)
Monocytes Absolute: 0.3 10*3/uL (ref 0.1–1.0)
Neutro Abs: 4.5 10*3/uL (ref 1.4–7.7)
Neutrophils Relative %: 60.8 % (ref 43.0–77.0)
Platelets: 282 10*3/uL (ref 150.0–400.0)
RBC: 4.33 Mil/uL (ref 3.87–5.11)
RDW: 13.4 % (ref 11.5–15.5)
WBC: 7.4 10*3/uL (ref 4.0–10.5)

## 2018-04-01 LAB — TSH: TSH: 2.43 u[IU]/mL (ref 0.35–4.50)

## 2018-04-01 LAB — HEMOGLOBIN A1C: Hgb A1c MFr Bld: 9 % — ABNORMAL HIGH (ref 4.6–6.5)

## 2018-04-01 LAB — IRON: Iron: 80 ug/dL (ref 42–145)

## 2018-04-01 LAB — FERRITIN: Ferritin: 67.3 ng/mL (ref 10.0–291.0)

## 2018-04-01 LAB — VITAMIN D 25 HYDROXY (VIT D DEFICIENCY, FRACTURES): VITD: 48.78 ng/mL (ref 30.00–100.00)

## 2018-04-01 MED ORDER — AMPHETAMINE-DEXTROAMPHETAMINE 10 MG PO TABS: ORAL_TABLET | ORAL | 0 refills | Status: DC

## 2018-04-01 MED ORDER — CLONAZEPAM 2 MG PO TABS: ORAL_TABLET | ORAL | 1 refills | Status: DC

## 2018-04-01 NOTE — Progress Notes (Signed)
OFFICE VISIT  04/01/2018   CC:  Chief Complaint  Patient presents with  . Follow-up    [facility], pt is fasting   HPI:    Patient is a 63 y.o. Caucasian female who presents for 6 mo f/u DM 2, GAD/depression, hepatic steatosis, and adult ADD. She has hx of deficiency of vit B12, vit D, and iron-->takes supplements of all 3 chronically--->these conditions at least in part due to hx of malabsorption from roux-en Y gastric bipass surgery.  Interim hx: doing ok other than still with signif chronic neck pain.  Most recent surgery was 12/2016. Dr.  Trenton Gammon still managing his pain.  Has f/u with him later today.   DM: glucoses "a whole lot better" per pt.  Taking 60 Lantus qhs and 38 qAM. Glucose around 90s in late afternoon.  Majority of glucoses under 200 now--the last 6 wks or so. Not having hypoglycemia.   Taking 18 U meal time insulin typically, sometimes adjusts this up/down depending on pre-prandial glucose.   Night time eating is still a problem. No burning, tingling, or numbness in feet.    GAD/Depression: taking lorazepam tid-->she insists that this makes her sleepy.  Still having several panic attacks tid or so.  Says clonazepam did not cause this excessive sedation in the past. Has a puppy now and she feels like this is helping her overall mood and anxiety some. Most recently took loraz around 7 pm yesterday.  Obesity/hepatic steatosis: her wt is up 10 lbs over the last 6 mo.  Still problematic night time eating. No exercise at all.  Adult ADD: hit the donut hole and stopped this med for many months in 2019, recently restarted 10 mg bid. Has been taking 1/2 tab qd lately, just started 1 tab bid TODAY.   Past Medical History:  Diagnosis Date  . Abnormal liver diagnostic imaging 05/20/2017  . Abnormal mammogram 12/2014   F/u diagnostic mammo and u/s NORMAL--resume annual screening mammography  . Arthritis   . Bipolar disorder ([place])    questionable  . Cervical radiculopathy     cervical spondylosis on 2013 MRI.  Dr. 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 Trenton Gammon then did posterior cervical fusion.  . Chronic fatigue   . Chronic nausea 2012/13   Normal upper GI  12/2011 (mild GERD noted +small hiatal hernia)+  . Chronic pain syndrome    Chronic C spine pain with radiculopathy.  [facility] Francesco [facility]ner [facility] her [facility] mg[facility] and he [facility] b/c she failed a UDS (+marijuana) 04/27/15  . Chronic renal insufficiency, stage II (mild) 02/2015   GFR 70s  . Colon cancer screening 03/06/2011   iFob 11/2010 through her insurer was negative. iFOB 01/2015 NEG.  . Depression   . Diabetes mellitus    urine protein-Cr ratio nl 09/2010, D.R. Screen neg 10/2009  . Fibromyalgia   . Fracture of rib of left side 08/14/15   5th and 6th  . Hepatic steatosis 2005   ultrasound.  Also, 07/2017 MRI abd--> plus a few focal areas of more severe steatosis.  Marland Kitchen History of kidney stones   . 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 Hyperlipidemia, mixed 2017   Waiting to start statin (trying to take one thing at a time with her, working on glucose control first)  . Insomnia   . Iron deficiency anemia 09/2009   malabsorbtion  s/p bariatric surgery  . Nephrolithiasis 11/2012  . Neurosis, anxiety, panic type   . Obesity    s/p bariatric surgery  . Osteopenia 12/2010   Hip T score -2.0.  Spine T score -0.5 (plan to repeat in 2 yrs)  . Other B-complex deficiencies   . Panic attacks   . Restless legs syndrome   . Syncopal episodes    7/73/17 in the setting of dehydration and sedative medication  . Tobacco dependence   . Vitamin D deficiency 09/2009  . Wears dentures    uppers and lowers    Past Surgical History:  Procedure Laterality Date  . ABDOMINAL HYSTERECTOMY     fibroids, ovaries remain  . APPENDECTOMY    . BACK SURGERY    . BLADDER SURGERY     bladder tack   . CERVICAL SPINE SURGERY  09/2009; 2014   2014-ant cerv decomp (Dr. Carloyn Manner)  . CHOLECYSTECTOMY  04/2010   w/lysis of adhesions (open procedure)--path showed chronic cholecystitis and cholesterol polyp.  . CYSTOSCOPY/RETROGRADE/URETEROSCOPY/STONE EXTRACTION WITH BASKET Left 11/21/2012   Left UVJ stone.  Procedure: CYSTOSCOPY/RETROGRADE/URETEROSCOPY/STONE EXTRACTION WITH BASKET insertion double j stent;  Surgeon: Ailene Rud, MD;  Location: [facility];  Service: Urology;  Laterality: Left;  . EPIDURAL BLOCK INJECTION  03/2011   Cervical  . HERNIA REPAIR  2005   mesenteric hernia repair, with lysis of adhesions (presented with SBO)  . POSTERIOR CERVICAL FUSION/FORAMINOTOMY N/A 12/31/2016   Procedure: Posterior Cervical Fusion with lateral mass fixation - Cervical four-five, Cervical five-six;  Surgeon: Earnie Larsson, MD;  Location: [facility];  Service: Neurosurgery;  Laterality: N/A;  . ROUX-EN-Y GASTRIC BYPASS    . TONSILLECTOMY      Outpatient Medications Prior to Visit  Medication Sig Dispense Refill  . amphetamine-dextroamphetamine (ADDERALL) 10 MG tablet 1 tab po bid 60 tablet 0  . B-D UF III MINI PEN NEEDLES 31G X 5 MM MISC USE AS DIRECTED UP TO 4 TIMES DAILY. 100 each 11  . calcium elemental as carbonate (TUMS ULTRA 1000) 400 MG chewable tablet Chew 1,000 mg by mouth daily.      . Cholecalciferol 2000 UNITS CAPS Take 1 capsule (2,000 Units total) by mouth daily. 30 each 11  . cyanocobalamin (,VITAMIN B-12,) 1000 MCG/ML injection INJECT 1ML INTO THE MUSCLE EVERY 14 DAYS. 2 mL 11  . DULoxetine (CYMBALTA) 30 MG capsule TAKE (1) CAPSULE BY MOUTH TWICE DAILY. 60 capsule 6  . fluconazole (DIFLUCAN) 150 MG tablet TAKE (1) TABLET BY MOUTH FOR 1 DOSE. 1 tablet 0  . gabapentin (NEURONTIN) 300 MG capsule TAKE 4 CAPSULES BY MOUTH AT BEDTIME. 360 capsule 1  . HYDROcodone-acetaminophen (NORCO) 10-325 MG tablet Take 1-1.5 tablets by mouth every 4 (four) hours as needed for moderate pain or severe pain.     Marland Kitchen insulin aspart (NOVOLOG FLEXPEN) 100 UNIT/ML FlexPen 18 U SQ qAC 15 mL 5  . insulin detemir (LEVEMIR) 100 UNIT/ML injection Inject 36units in the AM and 59units in the PM 30 mL 3  . Insulin Pen Needle (PEN NEEDLES) 31G X 8 MM MISC Use to inject insulin BID 100 each 11  . lamoTRIgine (LAMICTAL) 100 MG tablet Take 1 tablet (100 mg total) by mouth 2 (two) times daily. 60 tablet 6  . LORazepam (ATIVAN) 2 MG tablet TAKE (1) TABLET BY MOUTH EVERY EIGHT HOURS AS NEEDED. 90 tablet 2  . mirtazapine (REMERON) 30 MG tablet TAKE (1) TABLET BY MOUTH AT BEDTIME. 90 tablet 1  . ondansetron (ZOFRAN)  4 MG tablet Take 1 tablet (4 mg total) by mouth every 6 (six) hours as needed for nausea. 20 tablet 6  . tiZANidine (ZANAFLEX) 4 MG tablet Take 1 tablet (4 mg total) by mouth 2 (two) times daily. 60 tablet 6  . Insulin Detemir (LEVEMIR FLEXTOUCH) 100 UNIT/ML Pen INJECT 36 UNITS IN THE MORNING AND 59 UNITS IN THE EVENING. 30 mL 0   No facility-administered medications prior to visit.     Allergies  Allergen Reactions  . Aspirin Other (See Comments)    Gastric Bypass  . Latex Other (See Comments)    blisters  . Penicillins     UNSPECIFIED REACTION >"Took large quantities as a child & told to never take again Has patient had a PCN reaction causing immediate rash, facial/tongue/throat swelling, SOB or lightheadedness with hypotension: No Has patient had a PCN reaction causing severe rash involving mucus membranes or skin necrosis: No Has patient had a PCN reaction that required hospitalization: No Has patient had a PCN reaction occurring within the last 10 years: No If all of the above answers are "NO", then may proceed wi  . Codeine Itching    ROS As per HPI  PE: Blood pressure (!) 142/83, pulse 98, temperature 98.2 F (36.8 C), temperature source Oral, resp. rate 16, height 5' 2"  (1.575 m), weight 194 lb (88 kg), SpO2 93 %. Gen: Alert, well appearing.  Patient is oriented to person, place, time, and  situation. AFFECT: pleasant, lucid thought and speech. CV: RRR, no m/r/g.   LUNGS: CTA bilat, nonlabored resps, good aeration in all lung fields. Foot exam - bilateral normal; no swelling, tenderness or skin or vascular lesions. Color and temperature is normal. Sensation is intact. Peripheral pulses are palpable. Toenails are normal.   LABS:  Lab Results  Component Value Date   TSH 1.85 10/11/2016   Lab Results  Component Value Date   WBC 7.0 09/24/2017   HGB 13.4 09/24/2017   HCT 39.5 09/24/2017   MCV 95.2 09/24/2017   PLT 301.0 09/24/2017   Lab Results  Component Value Date   CREATININE 0.84 09/24/2017   BUN 17 09/24/2017   NA 138 09/24/2017   K 4.4 09/24/2017   CL 102 09/24/2017   CO2 30 09/24/2017   Lab Results  Component Value Date   ALT 27 09/24/2017   AST 30 09/24/2017   ALKPHOS 104 09/24/2017   BILITOT 0.4 09/24/2017   Lab Results  Component Value Date   CHOL 160 10/11/2016   Lab Results  Component Value Date   HDL 41.50 10/11/2016   Lab Results  Component Value Date   LDLCALC 95 10/11/2016   Lab Results  Component Value Date   TRIG 120.0 10/11/2016   Lab Results  Component Value Date   CHOLHDL 4 10/11/2016   Lab Results  Component Value Date   HGBA1C 9.5 (H) 09/24/2017   Lab Results  Component Value Date   VITAMINB12 493 07/09/2017     IMPRESSION AND PLAN:  1) DM 2: control not very good historically. Lots of noncompliance with diet, esp at night. She has a fair amount of habitual under-dosing of her insulin despite instructions (such as skipping insulin at mealtime if glucose is normal and skipping a dose of lantus if glucose high 100s--for fear of hypoglycemia. Feet exam normal today. HbA1c today. Urine microal/cr today. Lytes/cr today.  2) Adult ADHD, GAD, MDD: doing fine/stable.  She'll continue cymbalta 74m bid and remeron 30 mg hs. Will  change ativan to clonazepam trial (pt recollects that she responded to clonaz better than  loraz in the past), 62m, 1 tid, #90, RF x 1.  Adderall 120m 1 tid, #90. UDS today.  3) Preventative health care: mammogram ordered today for breast ca screening.  4) Obesity with hypercholesterolemia and hepatic steatosis: trying to control glucoses.  Pt needs to focus more/be more aggressive with lower fat/lower carb diet.  She does need to start statin but due to her fear of complicating her med regimen any further, pt declines this med for now.  I'll broach this topic again at each visit. Hepatic panel and FLP today.  5) Hx of iron def: check iron panel and cbc.  An After Visit Summary was printed and given to the patient.  FOLLOW UP: 6 mo (pt and I have agreed on this f/u interval in the past due to her significant agoraphobia.)  Signed:  PhCrissie SicklesMD           04/01/2018

## 2018-04-02 LAB — IRON AND TIBC
Iron Saturation: 24 % (ref 15–55)
Iron: 75 ug/dL (ref 27–139)
Total Iron Binding Capacity: 314 ug/dL (ref 250–450)
UIBC: 239 ug/dL (ref 118–369)

## 2018-04-04 LAB — PAIN MGMT, PROFILE 8 W/CONF, U
6 Acetylmorphine: NEGATIVE ng/mL (ref <10)
AMPHETAMINE: 1295 ng/mL — AB (ref <250)
Alcohol Metabolites: NEGATIVE ng/mL (ref <500)
Amphetamines: POSITIVE ng/mL — AB (ref <500)
Benzodiazepines: NEGATIVE ng/mL (ref <100)
Buprenorphine, Urine: NEGATIVE ng/mL (ref <5)
COCAINE METABOLITE: NEGATIVE ng/mL (ref <150)
Codeine: NEGATIVE ng/mL (ref <50)
Creatinine: 131 mg/dL
Hydrocodone: 792 ng/mL — ABNORMAL HIGH (ref <50)
Hydromorphone: 400 ng/mL — ABNORMAL HIGH (ref <50)
MDMA: NEGATIVE ng/mL (ref <500)
Marijuana Metabolite: NEGATIVE ng/mL (ref <20)
Methamphetamine: NEGATIVE ng/mL (ref <250)
Morphine: NEGATIVE ng/mL (ref <50)
Norhydrocodone: 1904 ng/mL — ABNORMAL HIGH (ref <50)
Opiates: POSITIVE ng/mL — AB (ref <100)
Oxidant: NEGATIVE ug/mL (ref <200)
Oxycodone: NEGATIVE ng/mL (ref <100)
pH: 7.12 (ref 4.5–9.0)

## 2018-04-05 ENCOUNTER — Encounter: Payer: Self-pay | Admitting: Family Medicine

## 2018-04-06 ENCOUNTER — Other Ambulatory Visit: Payer: Self-pay

## 2018-04-06 DIAGNOSIS — E7800 Pure hypercholesterolemia, unspecified: Secondary | ICD-10-CM

## 2018-04-06 MED ORDER — ATORVASTATIN CALCIUM 20 MG PO TABS: 20.0000 mg | ORAL_TABLET | Freq: Every day | ORAL | 2 refills | Status: DC

## 2018-04-08 ENCOUNTER — Other Ambulatory Visit: Payer: Self-pay | Admitting: Family Medicine

## 2018-05-18 ENCOUNTER — Other Ambulatory Visit: Payer: Self-pay | Admitting: Family Medicine

## 2018-05-18 NOTE — Telephone Encounter (Signed)
RF request for lamictal and cymbalta.  I can't tell if patient is just taking these incorrectly or if they were stopped and restarted?   Last RF for both Lamictal and cymbalta was 07/09/2017 # 60 w/ 5 rfs.   Patient is supposed to be taking BID per instructions.   Please advise?

## 2018-05-19 NOTE — Telephone Encounter (Signed)
Pt notified of med refills and takes both BID as directed.

## 2018-06-15 ENCOUNTER — Other Ambulatory Visit: Payer: Self-pay | Admitting: Family Medicine

## 2018-06-16 NOTE — Telephone Encounter (Signed)
RF request for Tizanidine LOV: 04/01/18, [facility] Next ov: advised to f/u in August Last written: 07/09/17 (60,0)  Medication is pending. Please advise, thanks.

## 2018-07-13 ENCOUNTER — Other Ambulatory Visit: Payer: Self-pay | Admitting: Family Medicine

## 2018-07-13 DIAGNOSIS — Z79899 Other long term (current) drug therapy: Secondary | ICD-10-CM

## 2018-07-13 DIAGNOSIS — F909 Attention-deficit hyperactivity disorder, unspecified type: Secondary | ICD-10-CM

## 2018-07-14 NOTE — Telephone Encounter (Signed)
RF request for Adderall LOV: 04/01/18 Next ov: f/u 6 months Last written: 04/01/18 (180,0) BID Last CSC: 07/09/17 w/ UDS:04/01/18  PMP aware printed.  RF request for Gabapentin LOV: 04/01/18 Next ov: f/u 6 months Last written: 01/16/18(360,1) QID  Both medications pending, please advise thanks.

## 2018-08-05 ENCOUNTER — Other Ambulatory Visit: Payer: Self-pay | Admitting: Family Medicine

## 2018-08-12 ENCOUNTER — Other Ambulatory Visit: Payer: Self-pay | Admitting: Family Medicine

## 2018-08-12 ENCOUNTER — Encounter: Payer: Self-pay | Admitting: Family Medicine

## 2018-08-12 NOTE — Telephone Encounter (Signed)
RF request for Atorvastatin LOV: 04/01/18 Next ov: advised to f/u 6 months but needed lab visit for FLP Last written: 06/16/18 (30,0)   RF request for Vit B12 LOV: 04/01/18 Next ov: advised to f/u 6 months for RCI Last written: (31m, 11) 07/29/17  RF request for BD Ultra short pen LOV: 04/01/18 Next ov: advised to f/u 6 months Last written: 08/07/17  Please advise, thanks. Medications pending

## 2018-09-11 ENCOUNTER — Other Ambulatory Visit: Payer: Self-pay

## 2018-09-11 ENCOUNTER — Other Ambulatory Visit: Payer: Self-pay | Admitting: Family Medicine

## 2018-09-11 MED ORDER — CYANOCOBALAMIN 1000 MCG/ML IJ SOLN: INTRAMUSCULAR | 0 refills | Status: DC

## 2018-09-11 MED ORDER — BD PEN NEEDLE SHORT U/F 31G X 8 MM MISC: 1 refills | Status: DC

## 2018-09-11 NOTE — Telephone Encounter (Signed)
Pt just needs refill on Tizanidine.  LOV: 04/01/18 Next ov: advised to f/u 6 months Last written: 06/16/18 (60,0)  Please advise, thanks. Medication pending

## 2018-10-13 ENCOUNTER — Telehealth: Payer: Self-pay | Admitting: Family Medicine

## 2018-10-13 ENCOUNTER — Other Ambulatory Visit: Payer: Self-pay | Admitting: Family Medicine

## 2018-10-13 DIAGNOSIS — Z79899 Other long term (current) drug therapy: Secondary | ICD-10-CM

## 2018-10-13 DIAGNOSIS — F909 Attention-deficit hyperactivity disorder, unspecified type: Secondary | ICD-10-CM

## 2018-10-14 ENCOUNTER — Other Ambulatory Visit: Payer: Self-pay | Admitting: Family Medicine

## 2018-10-15 ENCOUNTER — Other Ambulatory Visit: Payer: Self-pay | Admitting: Family Medicine

## 2018-10-15 NOTE — Telephone Encounter (Signed)
Patient called in and scheduled her CPE. Patient runs out of clonazePAM (KLONOPIN) 2 MG tablet today. Can she get enough medication to last until her appointment?

## 2018-10-15 NOTE — Telephone Encounter (Signed)
RF request for  B12 and Klonopin LOV: 04/01/2018 Next ov: 11/25/2018 Last written: B12 for injection 09/11/2018                      Klonopin 10m 04/01/2018  Please advise, thank you

## 2018-10-15 NOTE — Telephone Encounter (Signed)
Pt was notified that clonazepam and vit b12 sent to local pharmacy.

## 2018-11-11 ENCOUNTER — Other Ambulatory Visit: Payer: Self-pay | Admitting: Family Medicine

## 2018-11-12 ENCOUNTER — Other Ambulatory Visit: Payer: Self-pay

## 2018-11-12 MED ORDER — BD PEN NEEDLE SHORT U/F 31G X 8 MM MISC: 3 refills | Status: DC

## 2018-11-12 MED ORDER — LEVEMIR FLEXTOUCH 100 UNIT/ML ~~LOC~~ SOPN: PEN_INJECTOR | SUBCUTANEOUS | 0 refills | Status: DC

## 2018-11-12 MED ORDER — NOVOLOG FLEXPEN 100 UNIT/ML ~~LOC~~ SOPN: PEN_INJECTOR | SUBCUTANEOUS | 5 refills | Status: DC

## 2018-11-12 NOTE — Telephone Encounter (Signed)
RF request for Tizanidine LOV: 04/01/18 Next ov: 11/25/18 Last written: 09/11/18 (60,1)  Please Advise. Medication pending. Other medications have been filled.

## 2018-11-25 ENCOUNTER — Ambulatory Visit (INDEPENDENT_AMBULATORY_CARE_PROVIDER_SITE_OTHER): Payer: Medicare HMO | Admitting: Family Medicine

## 2018-11-25 ENCOUNTER — Encounter: Payer: Self-pay | Admitting: Family Medicine

## 2018-11-25 ENCOUNTER — Other Ambulatory Visit: Payer: Self-pay

## 2018-11-25 DIAGNOSIS — F909 Attention-deficit hyperactivity disorder, unspecified type: Secondary | ICD-10-CM | POA: Diagnosis not present

## 2018-11-25 DIAGNOSIS — F411 Generalized anxiety disorder: Secondary | ICD-10-CM

## 2018-11-25 DIAGNOSIS — Z79899 Other long term (current) drug therapy: Secondary | ICD-10-CM | POA: Diagnosis not present

## 2018-11-25 DIAGNOSIS — E785 Hyperlipidemia, unspecified: Secondary | ICD-10-CM

## 2018-11-25 DIAGNOSIS — E119 Type 2 diabetes mellitus without complications: Secondary | ICD-10-CM | POA: Diagnosis not present

## 2018-11-25 MED ORDER — AMPHETAMINE-DEXTROAMPHETAMINE 10 MG PO TABS: ORAL_TABLET | ORAL | 0 refills | Status: DC

## 2018-11-25 MED ORDER — INSULIN DETEMIR 100 UNIT/ML ~~LOC~~ SOLN: SUBCUTANEOUS | 3 refills | Status: DC

## 2018-11-25 MED ORDER — CLONAZEPAM 2 MG PO TABS: ORAL_TABLET | ORAL | 1 refills | Status: DC

## 2018-11-25 NOTE — Progress Notes (Signed)
Virtual Visit via Video Note  I connected with pt on 11/25/18 at  2:30 PM EDT by telephone b/c video enabled telemedicine application was not an option for the patient.  I verified that I am speaking with the correct person using two identifiers.  Location patient: home Location provider:work or home office Persons participating in the virtual visit: patient, provider  I discussed the limitations of evaluation and management by telemedicine and the availability of in person appointments. The patient expressed understanding and agreed to proceed.  Telemedicine visit is a necessity given the COVID-19 restrictions in place at the current time.  HPI: 63 y/o WF being seen today for f/u DM, HLD, chronic anxiety, adult ADD, MDD recurrent. I last saw her 03/2018, at which time I increased her levemir (Increase lantus to 64 units at bedtime and keep morning dose at 38 U) and started her on atorvastatin.  I ordered a screening mammogram but she did not get this done.  She has agoraphobia, and with covid 19 crisis she is even more afraid to get out of the house: has been out of home 5 times since March this year.   DM: control not very good historically. Lots of noncompliance with diet, esp at night. She has a fair amount of habitual under-dosing of her insulin despite instructions (such as skipping insulin at mealtime if glucose is  normal and skipping a dose of lantus if glucose high 100s--for fear of hypoglycemia. A1c was 9.0% at last f/u visit 03/2018. INTERIM HX:->taking levemir 40 U qAM and 64 U qPM.  Novolog 18 U each meal. 14d avg 167. 30d  avg 168 Mornings still 220 avg--eats a lot in evenings , as usual. Afternoon mostly 70s. Postprandial supper 80s.   Highest 373.  HLD: started atorvastatin last visit-> tolerating this at 90m qd dosing. Felt presyncopal at 465mdosing.  Anxiety/MDD/GAD:  A/P for these problems at the time of last f/u visit 03/2018 was "She'll continue cymbalta 3019mid  and remeron 30 mg hs. Will change ativan to clonazepam trial (pt recollects that she responded to clonaz better than loraz in the past), 2mg81m tid, #90, RF x 1.  Adderall 10mg26mtid, #90. UDS today".  UDS showed appropriate results. Interim HX: Got a new dog recently and this has made her happy, helps her some with decreasing panic attacks.  The change to clonaz was favorable, as expected. Most recent clonaz Rx filled 11/12/18, #90, rx by me. Has 1 RF remaining. Most recent adderall rx filled was 07/14/18, #180.  PMP aware reviewed and no red flags.  Past Medical History:  Diagnosis Date  . Abnormal liver diagnostic imaging 05/20/2017  . Abnormal mammogram 12/2014   F/u diagnostic mammo and u/s NORMAL--resume annual screening mammography  . Arthritis   . Bipolar disorder (HCC) [place]  . Cervical radiculopathy    cervical spondylosis on 2013 MRI.  Dr. 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 PooleTrenton Gammon did posterior cervical fusion.  . Chronic fatigue   . Chronic nausea 2012/13   Normal upper GI  12/2011 (mild GERD noted +small hiatal hernia)+  . Chronic pain syndrome    Chronic C spine pain with radiculopathy.  [facility] b/c she failed a UDS (+marijuana) 04/27/15  . Chronic renal insufficiency, stage II (mild) 02/2015  GFR 70s  . Colon cancer screening 03/06/2011   iFob 11/2010 through her insurer was negative. iFOB 01/2015 NEG.  . Depression   . Diabetes mellitus    urine protein-Cr ratio nl 09/2010, D.R. Screen neg 10/2009  . Fibromyalgia   . Fracture of rib of left side 08/14/15   5th and 6th  . Hepatic steatosis 2005   ultrasound.  Also, 07/2017 MRI abd--> plus a few focal areas of more severe steatosis.  Marland Kitchen History of kidney stones   . 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 Hyperlipidemia,  mixed 2017; 03/2018   Recommended statin 03/2018  . Insomnia   . Iron deficiency anemia 09/2009   malabsorbtion s/p bariatric surgery  . Nephrolithiasis 11/2012  . Neurosis, anxiety, panic type   . Obesity    s/p bariatric surgery  . Osteopenia 12/2010   Hip T score -2.0.  Spine T score -0.5 (plan to repeat in 2 yrs)  . Other B-complex deficiencies   . Panic attacks   . Restless legs syndrome   . Syncopal episodes    7/73/17 in the setting of dehydration and sedative medication  . Tobacco dependence   . Vitamin B12 deficiency   . Vitamin D deficiency 09/2009  . Wears dentures    uppers and lowers    Past Surgical History:  Procedure Laterality Date  . ABDOMINAL HYSTERECTOMY     fibroids, ovaries remain  . APPENDECTOMY    . BACK SURGERY    . BLADDER SURGERY     bladder tack  . CERVICAL SPINE SURGERY  09/2009; 2014   2014-ant cerv decomp (Dr. Carloyn Manner)  . CHOLECYSTECTOMY  04/2010   w/lysis of adhesions (open procedure)--path showed chronic cholecystitis and cholesterol polyp.  . CYSTOSCOPY/RETROGRADE/URETEROSCOPY/STONE EXTRACTION WITH BASKET Left 11/21/2012   Left UVJ stone.  Procedure: CYSTOSCOPY/RETROGRADE/URETEROSCOPY/STONE EXTRACTION WITH BASKET insertion double j stent;  Surgeon: Ailene Rud, MD;  Location: [facility];  Service: Urology;  Laterality: Left;  . EPIDURAL BLOCK INJECTION  03/2011   Cervical  . HERNIA REPAIR  2005   mesenteric hernia repair, with lysis of adhesions (presented with SBO)  . POSTERIOR CERVICAL FUSION/FORAMINOTOMY N/A 12/31/2016   Procedure: Posterior Cervical Fusion with lateral mass fixation - Cervical four-five, Cervical five-six;  Surgeon: Earnie Larsson, MD;  Location: [place];  Service: Neurosurgery;  Laterality: N/A;  . ROUX-EN-Y GASTRIC BYPASS    . TONSILLECTOMY      Family History  Problem Relation Age of Onset  . Heart disease Mother   . Cancer Mother   . Heart disease Father   . Cancer Sister   . Heart disease Sister   . Alzheimer's  disease Brother   . Kidney disease Brother      Current Outpatient Medications:  .  amphetamine-dextroamphetamine (ADDERALL) 10 MG tablet, TAKE (1) TABLET BY MOUTH TWICE DAILY., Disp: 180 tablet, Rfl: 0 .  atorvastatin (LIPITOR) 20 MG tablet, TAKE (1) TABLET BY MOUTH ONCE DAILY., Disp: 30 tablet, Rfl: 3 .  calcium elemental as carbonate (TUMS ULTRA 1000) 400 MG chewable tablet, Chew 1,000 mg by mouth daily.  , Disp: , Rfl:  .  Cholecalciferol 2000 UNITS CAPS, Take 1 capsule (2,000 Units total) by mouth daily. (Patient taking differently: Take 1 capsule by mouth daily. VIT D), Disp: 30 each, Rfl: 11 .  clonazePAM (KLONOPIN) 2 MG tablet, TAKE (1) TABLET BY MOUTH (3) TIMES DAILY., Disp: 90 tablet, Rfl: 1 .  cyanocobalamin (,VITAMIN B-12,) 1000 MCG/ML injection, INJECT 1ML INTO  THE MUSCLE EVERY 14 DAYS., Disp: 2 mL, Rfl: 0 .  cyanocobalamin (,VITAMIN B-12,) 1000 MCG/ML injection, INJECT 1ML INTO THE MUSCLE EVERY 14 DAYS., Disp: 12 mL, Rfl: 1 .  DULoxetine (CYMBALTA) 30 MG capsule, TAKE (1) CAPSULE BY MOUTH TWICE DAILY., Disp: 180 capsule, Rfl: 1 .  FERROUS SULFATE PO, Take by mouth., Disp: , Rfl:  .  gabapentin (NEURONTIN) 300 MG capsule, TAKE 4 CAPSULES BY MOUTH AT BEDTIME., Disp: 360 capsule, Rfl: 3 .  HYDROcodone-acetaminophen (NORCO) 10-325 MG tablet, Take 1-1.5 tablets by mouth every 4 (four) hours as needed for moderate pain or severe pain., Disp: , Rfl:  .  insulin aspart (NOVOLOG FLEXPEN) 100 UNIT/ML FlexPen, INJECT 18 UNITS UNDER THE SKIN BEFORE MEALS., Disp: 15 mL, Rfl: 5 .  Insulin Detemir (LEVEMIR FLEXTOUCH) 100 UNIT/ML Pen, INJECT 36 UNITS IN THE MORNING AND 59 UNITS IN THE EVENING. (Patient taking differently: INJECT 40 UNITS IN THE MORNING AND 64 UNITS IN THE EVENING.), Disp: 30 mL, Rfl: 0 .  insulin detemir (LEVEMIR) 100 UNIT/ML injection, Inject 36units in the AM and 59units in the PM (Patient taking differently: Inject 40units in the AM and 64units in the PM), Disp: 30 mL, Rfl: 3 .   Insulin Pen Needle (B-D ULTRAFINE III SHORT PEN) 31G X 8 MM MISC, Use as directed to check sugar up to 5 times daily., Disp: 100 each, Rfl: 1 .  Insulin Pen Needle (B-D ULTRAFINE III SHORT PEN) 31G X 8 MM MISC, USE AS DIRECTED UP TO 5 TIMES DAILY., Disp: 100 each, Rfl: 3 .  lamoTRIgine (LAMICTAL) 100 MG tablet, TAKE 1 TABLET BY MOUTH TWICE DAILY., Disp: 180 tablet, Rfl: 1 .  mirtazapine (REMERON) 30 MG tablet, TAKE (1) TABLET BY MOUTH AT BEDTIME., Disp: 90 tablet, Rfl: 1 .  ondansetron (ZOFRAN) 4 MG tablet, Take 1 tablet (4 mg total) by mouth every 6 (six) hours as needed for nausea., Disp: 20 tablet, Rfl: 6 .  tiZANidine (ZANAFLEX) 4 MG tablet, TAKE (1) TABLET BY MOUTH TWICE DAILY., Disp: 60 tablet, Rfl: 0  EXAM:  VITALS per patient if applicable: There were no vitals taken for this visit.   GENERAL: alert, oriented, sounds well and in no acute distress. She does get emotional/crying when I insist she come in to get labs and UDS. She is very anxious about getting out of her home. No further exam b/c audio visit only.  LABS: none today  Lab Results  Component Value Date   VITAMINB12 493 07/09/2017    Lab Results  Component Value Date   TSH 2.43 04/01/2018   Lab Results  Component Value Date   WBC 7.4 04/01/2018   HGB 13.9 04/01/2018   HCT 41.3 04/01/2018   MCV 95.5 04/01/2018   PLT 282.0 04/01/2018   Lab Results  Component Value Date   CREATININE 0.88 04/01/2018   BUN 13 04/01/2018   NA 140 04/01/2018   K 4.3 04/01/2018   CL 100 04/01/2018   CO2 30 04/01/2018   Lab Results  Component Value Date   ALT 34 04/01/2018   AST 41 (H) 04/01/2018   ALKPHOS 98 04/01/2018   BILITOT 0.5 04/01/2018   Lab Results  Component Value Date   CHOL 234 (H) 04/01/2018   Lab Results  Component Value Date   HDL 41.60 04/01/2018   Lab Results  Component Value Date   LDLCALC 162 (H) 04/01/2018   Lab Results  Component Value Date   TRIG 150.0 (H) 04/01/2018  Lab Results   Component Value Date   CHOLHDL 6 04/01/2018   Lab Results  Component Value Date   HGBA1C 9.0 (H) 04/01/2018    ASSESSMENT AND PLAN:  Discussed the following assessment and plan:  1) DM 2, control sounds improved. Diet in evenings/late at night is still horrible and this is affecting her overall diabetic control. No changes in insulins today: continue levemir 40 U qAM and 64 U qPM, continue novolog with meals via sliding scale (typically 15-18 U) per her report. Needs A1c sometime in the next 2 mo or I'll have to decline any further treatment due to lack of compliance.  2) HDL: tolerating 30m atorva dose but did not tolerate 421mqd dosing. FLP at future labs.  3) GAD/MDD/ADD: her anxiety is worse than usual b/c of covid 19 crisis. She was encouraged to get out of her house today. I made sure she knows that she should not be taking remeron-->just needs to be on cymbalta 3037mid and lamictal 100 mg bid.  Her clonazepam is helping some.  I agreed to rx 1 mo supply with 1 RF today. I also did 90d supply of her adderall 30m38m bid, #180. Reviewed PMP aware today: no red flags. UDS when comes in for future labs->must be in the next 2 months or no further rx's.  Spent 25 min with pt today, with >50% of this time spent in counseling and care coordination regarding the above problems.   I discussed the assessment and treatment plan with the patient. The patient was provided an opportunity to ask questions and all were answered. The patient agreed with the plan and demonstrated an understanding of the instructions.   The patient was advised to call back or seek an in-person evaluation if the symptoms worsen or if the condition fails to improve as anticipated.  F/u: 6 mo. Needs lab visit in the next 2 mo or no further rx's.  Signed:  PhilCrissie Sickles           11/25/2018

## 2018-12-03 ENCOUNTER — Encounter: Payer: Self-pay | Admitting: Family Medicine

## 2018-12-03 ENCOUNTER — Ambulatory Visit (INDEPENDENT_AMBULATORY_CARE_PROVIDER_SITE_OTHER): Payer: Medicare HMO

## 2018-12-03 ENCOUNTER — Other Ambulatory Visit: Payer: Self-pay

## 2018-12-03 DIAGNOSIS — S129XXA Fracture of neck, unspecified, initial encounter: Secondary | ICD-10-CM | POA: Diagnosis not present

## 2018-12-03 DIAGNOSIS — E119 Type 2 diabetes mellitus without complications: Secondary | ICD-10-CM

## 2018-12-03 DIAGNOSIS — Z6833 Body mass index (BMI) 33.0-33.9, adult: Secondary | ICD-10-CM | POA: Diagnosis not present

## 2018-12-03 DIAGNOSIS — R030 Elevated blood-pressure reading, without diagnosis of hypertension: Secondary | ICD-10-CM | POA: Diagnosis not present

## 2018-12-03 DIAGNOSIS — F411 Generalized anxiety disorder: Secondary | ICD-10-CM | POA: Diagnosis not present

## 2018-12-03 DIAGNOSIS — E785 Hyperlipidemia, unspecified: Secondary | ICD-10-CM

## 2018-12-03 DIAGNOSIS — E7800 Pure hypercholesterolemia, unspecified: Secondary | ICD-10-CM

## 2018-12-03 DIAGNOSIS — F909 Attention-deficit hyperactivity disorder, unspecified type: Secondary | ICD-10-CM | POA: Diagnosis not present

## 2018-12-03 DIAGNOSIS — Z79899 Other long term (current) drug therapy: Secondary | ICD-10-CM

## 2018-12-03 LAB — BASIC METABOLIC PANEL
BUN: 13 mg/dL (ref 6–23)
CO2: 34 mEq/L — ABNORMAL HIGH (ref 19–32)
Calcium: 10.4 mg/dL (ref 8.4–10.5)
Chloride: 100 mEq/L (ref 96–112)
Creatinine, Ser: 0.98 mg/dL (ref 0.40–1.20)
GFR: 57.52 mL/min — ABNORMAL LOW (ref >60.00)
Glucose, Bld: 226 mg/dL — ABNORMAL HIGH (ref 70–99)
Potassium: 5.7 mEq/L — ABNORMAL HIGH (ref 3.5–5.1)
Sodium: 141 mEq/L (ref 135–145)

## 2018-12-03 LAB — LIPID PANEL
Cholesterol: 164 mg/dL (ref 0–200)
HDL: 41.8 mg/dL (ref >39.00)
LDL Cholesterol: 99 mg/dL (ref 0–99)
NonHDL: 121.72
Total CHOL/HDL Ratio: 4
Triglycerides: 115 mg/dL (ref 0.0–149.0)
VLDL: 23 mg/dL (ref 0.0–40.0)

## 2018-12-03 LAB — HEMOGLOBIN A1C: Hgb A1c MFr Bld: 8.3 % — ABNORMAL HIGH (ref 4.6–6.5)

## 2018-12-04 ENCOUNTER — Other Ambulatory Visit: Payer: Self-pay | Admitting: Family Medicine

## 2018-12-04 DIAGNOSIS — E875 Hyperkalemia: Secondary | ICD-10-CM

## 2018-12-04 NOTE — Progress Notes (Signed)
Order for BMP due to hykalemia will be faxed to [facility] in [place] per patient request.

## 2018-12-05 LAB — PAIN MGMT, PROFILE 8 W/CONF, U
6 Acetylmorphine: NEGATIVE ng/mL
Alcohol Metabolites: NEGATIVE ng/mL (ref <500)
Alphahydroxyalprazolam: NEGATIVE ng/mL
Alphahydroxymidazolam: NEGATIVE ng/mL
Alphahydroxytriazolam: NEGATIVE ng/mL
Aminoclonazepam: 1429 ng/mL
Benzodiazepines: POSITIVE ng/mL
Buprenorphine, Urine: NEGATIVE ng/mL
Cocaine Metabolite: NEGATIVE ng/mL
Codeine: NEGATIVE ng/mL
Creatinine: 179.6 mg/dL
Hydrocodone: 2304 ng/mL
Hydromorphone: 261 ng/mL
Hydroxyethylflurazepam: NEGATIVE ng/mL
Lorazepam: NEGATIVE ng/mL
MDMA: NEGATIVE ng/mL
Marijuana Metabolite: NEGATIVE ng/mL
Morphine: NEGATIVE ng/mL
Nordiazepam: NEGATIVE ng/mL
Norhydrocodone: 2957 ng/mL
Opiates: POSITIVE ng/mL
Oxazepam: NEGATIVE ng/mL
Oxidant: NEGATIVE ug/mL
Oxycodone: NEGATIVE ng/mL
Temazepam: NEGATIVE ng/mL
pH: 6 (ref 4.5–9.0)

## 2018-12-14 ENCOUNTER — Telehealth: Payer: Self-pay | Admitting: Family Medicine

## 2018-12-14 ENCOUNTER — Other Ambulatory Visit: Payer: Self-pay | Admitting: Family Medicine

## 2018-12-14 ENCOUNTER — Telehealth: Payer: Self-pay

## 2018-12-14 NOTE — Telephone Encounter (Signed)
A urinalysis would be good, dx is history of kidney stones.

## 2018-12-14 NOTE — Telephone Encounter (Signed)
Lab requisition printed and faxed to [facility] in [place].

## 2018-12-14 NOTE — Telephone Encounter (Signed)
Please send lab orders to [facility], [address] , [place]. Patient is going on Friday 12/18/18.

## 2018-12-14 NOTE — Telephone Encounter (Signed)
Patient left voicemail stating she is having pulsation below abdomen, believes it is near her urethra. It feels like a kidney stone is trying to pass. Her last one was 1cm in size. She is still urinating after going to the bathroom.   She wanted to know if you had any recommendations or order anything else while she has labs on 12/18/18.

## 2018-12-15 ENCOUNTER — Encounter: Payer: Self-pay | Admitting: Family Medicine

## 2018-12-15 ENCOUNTER — Other Ambulatory Visit: Payer: Self-pay

## 2018-12-15 DIAGNOSIS — Z87442 Personal history of urinary calculi: Secondary | ICD-10-CM

## 2018-12-15 NOTE — Telephone Encounter (Signed)
Lab order entered for urinalysis and faxed to [facility] in [place]. Patient was made aware.

## 2018-12-18 DIAGNOSIS — E875 Hyperkalemia: Secondary | ICD-10-CM | POA: Diagnosis not present

## 2018-12-19 LAB — BASIC METABOLIC PANEL
BUN: 16 mg/dL (ref 7–25)
CO2: 30 mmol/L (ref 20–32)
Calcium: 9.7 mg/dL (ref 8.6–10.4)
Chloride: 98 mmol/L (ref 98–110)
Creat: 0.96 mg/dL (ref 0.50–0.99)
Glucose, Bld: 217 mg/dL — ABNORMAL HIGH (ref 65–139)
Potassium: 5 mmol/L (ref 3.5–5.3)
Sodium: 135 mmol/L (ref 135–146)

## 2019-01-13 ENCOUNTER — Other Ambulatory Visit: Payer: Self-pay | Admitting: Family Medicine

## 2019-02-19 ENCOUNTER — Other Ambulatory Visit: Payer: Self-pay | Admitting: Family Medicine

## 2019-02-19 NOTE — Telephone Encounter (Signed)
Requesting: clonazepam Contract:07/09/17 UDS:12/03/18 Last Visit:11/25/18 Next Visit:advised to f/u 6 mo. Last Refill: 11/25/18 (90,1)  Please Advise. Medication pending. Last pick up was 01/13/19.

## 2019-03-15 ENCOUNTER — Other Ambulatory Visit: Payer: Self-pay | Admitting: Family Medicine

## 2019-04-02 ENCOUNTER — Other Ambulatory Visit: Payer: Self-pay | Admitting: Family Medicine

## 2019-04-02 DIAGNOSIS — Z79899 Other long term (current) drug therapy: Secondary | ICD-10-CM

## 2019-04-02 DIAGNOSIS — F909 Attention-deficit hyperactivity disorder, unspecified type: Secondary | ICD-10-CM

## 2019-04-02 NOTE — Telephone Encounter (Signed)
RF request for b-d short pen 31gx45m LOV:11/25/18 Next ov: advised to f/u 6 mo. Last written:09/11/18(100,1)   Requesting: adderall 181mContract:07/09/17 UDS:12/03/18 Last Visit:11/25/18 Next Visit: advised to f/u 6 mo. Last Refill:11/25/18(180,0)  Please Advise. Medications pending

## 2019-04-14 ENCOUNTER — Other Ambulatory Visit: Payer: Self-pay | Admitting: Family Medicine

## 2019-04-14 NOTE — Telephone Encounter (Signed)
RF request for atorvastatin LOV:11/25/18 Next ov: advised to f/u 569moLast written:01/13/19(90,1)   RF request for mirtazapine LOV:11/25/18 Next ov: advised to f/u 650moast written:01/13/19 (90,1)  RF request for tizanidine LOV:11/25/18 Next ov: advised to f/u 69m469most written:01/13/19(60,0)   Medications pending, Please advise.

## 2019-05-24 ENCOUNTER — Other Ambulatory Visit: Payer: Self-pay | Admitting: Family Medicine

## 2019-05-25 NOTE — Telephone Encounter (Signed)
Pt contacted and will call back to schedule f/u appt. Will send in short term supply.

## 2019-05-26 DIAGNOSIS — Z981 Arthrodesis status: Secondary | ICD-10-CM | POA: Diagnosis not present

## 2019-05-28 ENCOUNTER — Other Ambulatory Visit: Payer: Self-pay | Admitting: Neurosurgery

## 2019-05-28 ENCOUNTER — Other Ambulatory Visit: Payer: Self-pay | Admitting: Family Medicine

## 2019-05-28 DIAGNOSIS — Z981 Arthrodesis status: Secondary | ICD-10-CM

## 2019-05-28 DIAGNOSIS — Z1231 Encounter for screening mammogram for malignant neoplasm of breast: Secondary | ICD-10-CM

## 2019-06-03 ENCOUNTER — Other Ambulatory Visit: Payer: Medicare HMO

## 2019-06-09 ENCOUNTER — Ambulatory Visit: Payer: Medicare HMO | Admitting: Family Medicine

## 2019-06-12 HISTORY — PX: CT CERVICAL SPINE W CONTRAST  (ARMC HX): HXRAD1344

## 2019-06-14 ENCOUNTER — Other Ambulatory Visit: Payer: Self-pay

## 2019-06-14 ENCOUNTER — Ambulatory Visit
Admission: RE | Admit: 2019-06-14 | Discharge: 2019-06-14 | Disposition: A | Discharge status: Home / Self Care | Payer: Medicare HMO | Source: Ambulatory Visit | Attending: Neurosurgery | Admitting: Neurosurgery

## 2019-06-14 ENCOUNTER — Ambulatory Visit
Admission: RE | Admit: 2019-06-14 | Discharge: 2019-06-14 | Disposition: A | Discharge status: Home / Self Care | Payer: Medicare HMO | Source: Ambulatory Visit | Attending: Family Medicine | Admitting: Family Medicine

## 2019-06-14 DIAGNOSIS — Z1231 Encounter for screening mammogram for malignant neoplasm of breast: Secondary | ICD-10-CM

## 2019-06-14 DIAGNOSIS — M5011 Cervical disc disorder with radiculopathy,  high cervical region: Secondary | ICD-10-CM | POA: Diagnosis not present

## 2019-06-14 DIAGNOSIS — Z981 Arthrodesis status: Secondary | ICD-10-CM

## 2019-06-15 ENCOUNTER — Ambulatory Visit (INDEPENDENT_AMBULATORY_CARE_PROVIDER_SITE_OTHER): Payer: Medicare HMO | Admitting: Family Medicine

## 2019-06-15 ENCOUNTER — Encounter: Payer: Self-pay | Admitting: Family Medicine

## 2019-06-15 ENCOUNTER — Other Ambulatory Visit: Payer: Self-pay

## 2019-06-15 VITALS — BP 140/80 | HR 108 | Temp 98.5°F | Resp 16 | Ht 62.0 in | Wt 193.8 lb

## 2019-06-15 DIAGNOSIS — E7800 Pure hypercholesterolemia, unspecified: Secondary | ICD-10-CM | POA: Diagnosis not present

## 2019-06-15 DIAGNOSIS — F988 Other specified behavioral and emotional disorders with onset usually occurring in childhood and adolescence: Secondary | ICD-10-CM | POA: Diagnosis not present

## 2019-06-15 DIAGNOSIS — Z23 Encounter for immunization: Secondary | ICD-10-CM

## 2019-06-15 DIAGNOSIS — F909 Attention-deficit hyperactivity disorder, unspecified type: Secondary | ICD-10-CM | POA: Diagnosis not present

## 2019-06-15 DIAGNOSIS — E119 Type 2 diabetes mellitus without complications: Secondary | ICD-10-CM | POA: Diagnosis not present

## 2019-06-15 DIAGNOSIS — Z8639 Personal history of other endocrine, nutritional and metabolic disease: Secondary | ICD-10-CM | POA: Diagnosis not present

## 2019-06-15 DIAGNOSIS — F411 Generalized anxiety disorder: Secondary | ICD-10-CM

## 2019-06-15 DIAGNOSIS — K7460 Unspecified cirrhosis of liver: Secondary | ICD-10-CM | POA: Diagnosis not present

## 2019-06-15 DIAGNOSIS — F3341 Major depressive disorder, recurrent, in partial remission: Secondary | ICD-10-CM

## 2019-06-15 DIAGNOSIS — Z79899 Other long term (current) drug therapy: Secondary | ICD-10-CM

## 2019-06-15 DIAGNOSIS — F4000 Agoraphobia, unspecified: Secondary | ICD-10-CM | POA: Diagnosis not present

## 2019-06-15 LAB — MICROALBUMIN / CREATININE URINE RATIO
Creatinine,U: 148.3 mg/dL
Microalb Creat Ratio: 1.4 mg/g (ref 0.0–30.0)
Microalb, Ur: 2.1 mg/dL — ABNORMAL HIGH (ref 0.0–1.9)

## 2019-06-15 LAB — COMPREHENSIVE METABOLIC PANEL
ALT: 32 U/L (ref 0–35)
AST: 35 U/L (ref 0–37)
Albumin: 4.2 g/dL (ref 3.5–5.2)
Alkaline Phosphatase: 105 U/L (ref 39–117)
BUN: 15 mg/dL (ref 6–23)
CO2: 31 mEq/L (ref 19–32)
Calcium: 9.6 mg/dL (ref 8.4–10.5)
Chloride: 99 mEq/L (ref 96–112)
Creatinine, Ser: 0.88 mg/dL (ref 0.40–1.20)
GFR: 65.02 mL/min (ref >60.00)
Glucose, Bld: 297 mg/dL — ABNORMAL HIGH (ref 70–99)
Potassium: 5.2 mEq/L — ABNORMAL HIGH (ref 3.5–5.1)
Sodium: 134 mEq/L — ABNORMAL LOW (ref 135–145)
Total Bilirubin: 0.4 mg/dL (ref 0.2–1.2)
Total Protein: 7.5 g/dL (ref 6.0–8.3)

## 2019-06-15 LAB — LIPID PANEL
Cholesterol: 148 mg/dL (ref 0–200)
HDL: 40.5 mg/dL (ref >39.00)
LDL Cholesterol: 90 mg/dL (ref 0–99)
NonHDL: 107.62
Total CHOL/HDL Ratio: 4
Triglycerides: 89 mg/dL (ref 0.0–149.0)
VLDL: 17.8 mg/dL (ref 0.0–40.0)

## 2019-06-15 LAB — HEMOGLOBIN A1C: Hgb A1c MFr Bld: 9.2 % — ABNORMAL HIGH (ref 4.6–6.5)

## 2019-06-15 MED ORDER — DULOXETINE HCL 30 MG PO CPEP: ORAL_CAPSULE | ORAL | 3 refills | Status: DC

## 2019-06-15 MED ORDER — GABAPENTIN 300 MG PO CAPS: ORAL_CAPSULE | ORAL | 3 refills | Status: DC

## 2019-06-15 MED ORDER — LAMOTRIGINE 100 MG PO TABS: ORAL_TABLET | ORAL | 3 refills | Status: DC

## 2019-06-15 MED ORDER — AMPHETAMINE-DEXTROAMPHETAMINE 10 MG PO TABS: ORAL_TABLET | ORAL | 0 refills | Status: DC

## 2019-06-15 NOTE — Addendum Note (Signed)
Addended by: Deveron Furlong D on: 06/15/2019 12:15 PM   Modules accepted: Orders

## 2019-06-15 NOTE — Progress Notes (Signed)
Office Note 06/15/2019  CC:  Chief Complaint  Patient presents with  . Follow-up    RCI, pt is fasting    HPI:  Diana [name] is a 64 y.o. White female who is here for 6 mo f/u DM, HLD, chronic anxiety, adult ADD, MDD- recurrent. She has agoraphobia, and with covid 19 crisis she is even more afraid to get out of the house. DM: control not very good historically. Lots of noncompliance with diet, esp at night. She has a fair amount of habitual under-dosing of her insulin despite instructions (such as skipping insulin at mealtime if glucose is  normal and skipping a dose of lantus if glucose high 100s--for fear of hypoglycemia.  A/P as of last visit: "1) DM 2, control sounds improved. Diet in evenings/late at night is still horrible and this is affecting her overall diabetic control. No changes in insulins today: continue levemir 40 U qAM and 64 U qPM, continue novolog with meals via sliding scale (typically 15-18 U) per her report. Needs A1c sometime in the next 2 mo or I'll have to decline any further treatment due to lack of compliance.  2) HDL: tolerating 44m atorva dose but did not tolerate 476mqd dosing. FLP at future labs.  3) GAD/MDD/ADD: her anxiety is worse than usual b/c of covid 19 crisis. She was encouraged to get out of her house today. I made sure she knows that she should not be taking remeron-->just needs to be on cymbalta 3050mid and lamictal 100 mg bid.  Her clonazepam is helping some.  I agreed to rx 1 mo supply with 1 RF today. I also did 90d supply of her adderall 30m3m bid, #180. Reviewed PMP aware today: no red flags. UDS when comes in for future labs->must be in the next 2 months or no further rx's."  INTERIM HX: Severe chronic anxiety unchanged, has only been out of home 13 times now since covid pandemic  Mood is stable. She continues to take remeron 30mg74m b/c this has helped her with insomnia as well as mood/anxiety. She continues to  also take lamictal but only 100mg 32mnstead of bid as rx'd and cymbalta 30mg q56md instead of bid.  Clonazepam helps moderately well. She still has severe agoraphobia and recurrent panic.  Her dog has helped this quite a bit though, asks for letter stating she needs this dog with her for emotional support. Pt states all is going well with the med at current adderall dosing: improved focus, concentration, task completion.  Less frustration, better multitasking, less impulsivity and restlessness.   No side effects from the medication.  PMP AWARE reviewed today: most recent rx for clonaz 2mg was41mlled 05/26/19, # 90, rx b48me. Most recent rx for adderall 30mg was89m9/21, #180, rx by me. No red flags.   Glucoses fluctuate quite a bit as usual, not good diet habits in evening as per her usual.  She has no glucose readings and is not able to give me firm gluc ranges today.  No glucoses <80 but pt admits she eats erratically sometims and still takes insulin and feels like sugar dropping-->eats a lot in response. Taking novolog 18 qAC. Levemir 42 qAM, 64 qPM.  Checking glucose avg of 2-3 times a day. She is interested in seeing endo, Dr. Nida FEETDorris Fetch burning, tingling, or numbness. Pain in R great toe due to chronic ingrowing toenail.  HLD: tolerating statin.  Diet is fair at best.  ROS: no fevers, no CP, no SOB, no wheezing, no cough, no dizziness, no HAs, no rashes, no melena/hematochezia.  No polyuria or polydipsia.  Chronic neck pain.  No focal weakness, paresthesias, or tremors.  No acute vision or hearing abnormalities. No n/v/d or abd pain.  No palpitations.     Past Medical History:  Diagnosis Date  . Abnormal liver diagnostic imaging 05/20/2017   CT abd->cirrhosis with atypical lesions that MRI f/u determined to be focal regions of severe steatosis.  . Abnormal mammogram 12/2014   F/u diagnostic mammo and u/s NORMAL--resume annual screening mammography  . Arthritis   . Bipolar  disorder ([place])    questionable  . Cervical radiculopathy    cervical spondylosis on 2013 MRI.  Dr. 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 Trenton Gammon then did posterior cervical fusion.  . Chronic fatigue   . Chronic nausea 2012/13   Normal upper GI  12/2011 (mild GERD noted +small hiatal hernia)+  . Chronic pain syndrome    Chronic C spine pain with radiculopathy.  [facility] Francesco Runner [facility] was her pain mgmt MD and he discharged her from [facility] pain mgmt center b/c she failed a UDS (+marijuana) 04/27/15  . Chronic renal insufficiency, stage II (mild) 02/2015   GFR 70s  . Cirrhosis, nonalcoholic ([place])    NAFLD->CT 2019 showed cirrhotic changes and f/u MRI showed benign areas of signif increased/severe focal steatosis.  Spleen normal 2019.  . Colon cancer screening 03/06/2011   iFob 11/2010 through her insurer was negative. iFOB 01/2015 NEG.  . Depression   . Diabetes mellitus    urine protein-Cr ratio nl 09/2010, D.R. Screen neg 10/2009  . Fibromyalgia   . Fracture of rib of left side 08/14/15   5th and 6th  . Hepatic steatosis 2005   ultrasound.  Also, 07/2017 MRI abd--> plus a few focal areas of more severe steatosis.  Marland Kitchen History of kidney stones   . 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 Hyperlipidemia, mixed 2017; 03/2018   Recommended statin 03/2018  . Insomnia   . Iron deficiency anemia 09/2009   malabsorbtion s/p bariatric surgery  . Nephrolithiasis 11/2012  . Neurosis, anxiety, panic type   . Obesity    s/p bariatric surgery  . Osteopenia 12/2010   Hip T score -2.0.  Spine T score -0.5 (plan to repeat in 2 yrs)  . Other B-complex deficiencies   . Panic attacks   . Restless legs syndrome   . Syncopal episodes    7/73/17 in the setting of dehydration and sedative medication  . Tobacco dependence   . Vitamin B12 deficiency   . Vitamin D deficiency 09/2009  . Wears dentures    uppers and  lowers    Past Surgical History:  Procedure Laterality Date  . ABDOMINAL HYSTERECTOMY     fibroids, ovaries remain  . APPENDECTOMY    . BACK SURGERY    . BLADDER SURGERY     bladder tack  . CERVICAL SPINE SURGERY  09/2009; 8546;2703   2014-ant cerv decomp (Dr. Carloyn Manner). 2018 fusion (Dr. Annette Stable)  . CHOLECYSTECTOMY  04/2010   w/lysis of adhesions (open procedure)--path showed chronic cholecystitis and cholesterol polyp.  . CYSTOSCOPY/RETROGRADE/URETEROSCOPY/STONE EXTRACTION WITH BASKET Left 11/21/2012   Left UVJ stone.  Procedure: CYSTOSCOPY/RETROGRADE/URETEROSCOPY/STONE EXTRACTION WITH BASKET insertion double j stent;  Surgeon: Ailene Rud, MD;  Location: [facility];  Service: Urology;  Laterality: Left;  .  EPIDURAL BLOCK INJECTION  03/2011   Cervical  . HERNIA REPAIR  2005   mesenteric hernia repair, with lysis of adhesions (presented with SBO)  . POSTERIOR CERVICAL FUSION/FORAMINOTOMY N/A 12/31/2016   Procedure: Posterior Cervical Fusion with lateral mass fixation - Cervical four-five, Cervical five-six;  Surgeon: Earnie Larsson, MD;  Location: [facility];  Service: Neurosurgery;  Laterality: N/A;  . ROUX-EN-Y GASTRIC BYPASS    . TONSILLECTOMY      Family History  Problem Relation Age of Onset  . Heart disease Mother   . Cancer Mother   . Heart disease Father   . Cancer Sister   . Heart disease Sister   . Alzheimer's disease Brother   . Kidney disease Brother     Social History   Socioeconomic History  . Marital status: Divorced    Spouse name: Not on file  . Number of children: Not on file  . Years of education: Not on file  . Highest education level: Not on file  Occupational History  . Not on file  Tobacco Use  . Smoking status: Former Smoker    Packs/day: 1.00    Years: 1.00    Pack years: 1.00    Types: Cigarettes    Quit date: 04/11/2016    Years since quitting: 3.1  . Smokeless tobacco: Never Used  Substance and Sexual Activity  . Alcohol use: No  . Drug use: No   . Sexual activity: Not on file  Other Topics Concern  . Not on file  Social History Narrative   Lives with longtime boyfriend in [place], [place].   Had a stepson but he committed suicide 2012/13.   Disabled due to anxiety.   Longtime smoker, quits frequently but restarts.   No alcohol or illegal drugs.   No exercise.   Social Determinants of Health   Financial Resource Strain:   . Difficulty of Paying Living Expenses:   Food Insecurity:   . Worried About Charity fundraiser in the Last Year:   . Arboriculturist in the Last Year:   Transportation Needs:   . Film/video editor (Medical):   Marland Kitchen Lack of Transportation (Non-Medical):   Physical Activity:   . Days of Exercise per Week:   . Minutes of Exercise per Session:   Stress:   . Feeling of Stress :   Social Connections:   . Frequency of Communication with Friends and Family:   . Frequency of Social Gatherings with Friends and Family:   . Attends Religious Services:   . Active Member of Clubs or Organizations:   . Attends Archivist Meetings:   Marland Kitchen Marital Status:   Intimate Partner Violence:   . Fear of Current or Ex-Partner:   . Emotionally Abused:   Marland Kitchen Physically Abused:   . Sexually Abused:     Outpatient Medications Prior to Visit  Medication Sig Dispense Refill  . atorvastatin (LIPITOR) 20 MG tablet TAKE (1) TABLET BY MOUTH ONCE DAILY. 90 tablet 3  . B-D ULTRAFINE III SHORT PEN 31G X 8 MM MISC USE AS DIRECTED UP TO 5 TIMES DAILY. 100 each 3  . calcium elemental as carbonate (TUMS ULTRA 1000) 400 MG chewable tablet Chew 1,000 mg by mouth daily.      . Cholecalciferol 2000 UNITS CAPS Take 1 capsule (2,000 Units total) by mouth daily. (Patient taking differently: Take 1 capsule by mouth daily. VIT D) 30 each 11  . clonazePAM (KLONOPIN) 2 MG tablet TAKE (1)  TABLET BY MOUTH (3) TIMES DAILY. 90 tablet 3  . cyanocobalamin (,VITAMIN B-12,) 1000 MCG/ML injection INJECT 1ML INTO THE MUSCLE EVERY 14 DAYS. 12 mL 1   . FERROUS SULFATE PO Take by mouth.    Marland Kitchen HYDROcodone-acetaminophen (NORCO) 10-325 MG tablet Take 1-1.5 tablets by mouth every 4 (four) hours as needed for moderate pain or severe pain.    . Insulin Pen Needle (B-D ULTRAFINE III SHORT PEN) 31G X 8 MM MISC USE AS DIRECTED UP TO 5 TIMES DAILY. 100 each 3  . LEVEMIR FLEXTOUCH 100 UNIT/ML FlexPen INJECT 36 UNITS IN THE MORNING AND 59 UNITS IN THE EVENING. (Patient taking differently: Inject 42 units in the morning and 64 units in the evening.) 30 mL 0  . mirtazapine (REMERON) 30 MG tablet TAKE (1) TABLET BY MOUTH AT BEDTIME. 90 tablet 3  . NOVOLOG FLEXPEN 100 UNIT/ML FlexPen INJECT 18 UNITS UNDER THE SKIN BEFORE MEALS. 15 mL 0  . tiZANidine (ZANAFLEX) 4 MG tablet TAKE (1) TABLET BY MOUTH TWICE DAILY. 180 tablet 3  . amphetamine-dextroamphetamine (ADDERALL) 10 MG tablet TAKE (1) TABLET BY MOUTH TWICE DAILY. 180 tablet 0  . cyanocobalamin (,VITAMIN B-12,) 1000 MCG/ML injection INJECT 1ML INTO THE MUSCLE EVERY 14 DAYS. 2 mL 0  . DULoxetine (CYMBALTA) 30 MG capsule TAKE (1) CAPSULE BY MOUTH TWICE DAILY. (Patient taking differently: daily. TAKE (1) CAPSULE BY MOUTH TWICE DAILY.) 180 capsule 1  . gabapentin (NEURONTIN) 300 MG capsule TAKE 4 CAPSULES BY MOUTH AT BEDTIME. 360 capsule 3  . lamoTRIgine (LAMICTAL) 100 MG tablet TAKE 1 TABLET BY MOUTH TWICE DAILY. (Patient taking differently: daily. ) 180 tablet 1  . ondansetron (ZOFRAN) 4 MG tablet Take 1 tablet (4 mg total) by mouth every 6 (six) hours as needed for nausea. (Patient not taking: Reported on 06/15/2019) 20 tablet 6  . insulin detemir (LEVEMIR) 100 UNIT/ML injection Inject 40units in the AM and 64units in the PM (Patient not taking: Reported on 06/15/2019) 30 mL 3  . Insulin Pen Needle (B-D ULTRAFINE III SHORT PEN) 31G X 8 MM MISC Use as directed to check sugar up to 5 times daily. 100 each 1   No facility-administered medications prior to visit.    Allergies  Allergen Reactions  . Aspirin Other  (See Comments)    Gastric Bypass  . Latex Other (See Comments)    blisters  . Penicillins     UNSPECIFIED REACTION >"Took large quantities as a child & told to never take again Has patient had a PCN reaction causing immediate rash, facial/tongue/throat swelling, SOB or lightheadedness with hypotension: No Has patient had a PCN reaction causing severe rash involving mucus membranes or skin necrosis: No Has patient had a PCN reaction that required hospitalization: No Has patient had a PCN reaction occurring within the last 10 years: No If all of the above answers are "NO", then may proceed wi  . Codeine Itching   PE; Vitals with BMI 06/15/2019 04/01/2018 09/24/2017  Height 5' 2"  5' 2"  5' 2"   Weight 193 lbs 13 oz 194 lbs 184 lbs 2 oz  BMI 35.44 59.16 38.46  Systolic 659 935 701  Diastolic 80 83 85  Pulse 779 98 98   Gen: Alert, well appearing.  Patient is oriented to person, place, time, and situation. AFFECT: pleasant, lucid thought and speech. CV: RRR, no m/r/g.   LUNGS: CTA bilat, nonlabored resps, good aeration in all lung fields. EXT: no clubbing or cyanosis.  no edema.  Foot exam --  R great toe with mild erythema over distal 1/3 of distal phalanx/periphery of nail plate.  No active. exudate. +signif TTP and definite ingrowing noted.  No fluctuance.  Some dried exudate along medial nail border. No significant toe.  Otherwise foot exam is normal bilat-->no swelling, tenderness or skin or vascular lesions. Color and temperature is normal. Sensation is intact. Peripheral pulses are palpable.   Pertinent labs:  Lab Results  Component Value Date   TSH 2.43 04/01/2018   Lab Results  Component Value Date   WBC 7.4 04/01/2018   HGB 13.9 04/01/2018   HCT 41.3 04/01/2018   MCV 95.5 04/01/2018   PLT 282.0 04/01/2018   Lab Results  Component Value Date   CREATININE 0.96 12/18/2018   BUN 16 12/18/2018   NA 135 12/18/2018   K 5.0 12/18/2018   CL 98 12/18/2018   CO2 30 12/18/2018    Lab Results  Component Value Date   ALT 34 04/01/2018   AST 41 (H) 04/01/2018   ALKPHOS 98 04/01/2018   BILITOT 0.5 04/01/2018   Lab Results  Component Value Date   CHOL 164 12/03/2018   Lab Results  Component Value Date   HDL 41.80 12/03/2018   Lab Results  Component Value Date   LDLCALC 99 12/03/2018   Lab Results  Component Value Date   TRIG 115.0 12/03/2018   Lab Results  Component Value Date   CHOLHDL 4 12/03/2018   Lab Results  Component Value Date   HGBA1C 8.3 (H) 12/03/2018   ASSESSMENT AND PLAN:   1) DM 2, poor control, dietary noncompliance. Chronic R great toe ingrowing toenail, failing conservative mgmt--recommended removal of this nail. Feet exam normal otherwise normal. Ref to Dr. Dorris Fetch as per pt request. HbA1c, urine microalb/cr, lytes/cr today.  2) HLD: tolerating statin. LDL goal <70. FLP and hepatic panel today.  3) Chronic severe GAD, panic, agoraphobia, hx of recurrent MDD: has not done well at all with her GAD/agoraphobia/panic since covid pandemic.   Has gotten out of her home a total of 13 times since pandemic hit. Continue all current meds--I think she is at maximal functional level right now. Gave letter today stating she benefits from her dog for emotional support.  4) Adult ADD. The current medical regimen is effective;  continue present plan and medications. I did electronic rx adderall 1 bid, 180 (90 d supply) today.   CSC renewed today. UDS with approp results 11/2018. Plan rpt UDS at next f/u in 6 mo.  5) Preventative health.  She has had covid 19 vaccine x 2. Shingrix #1 today.  An After Visit Summary was printed and given to the patient.  FOLLOW UP:  Return for CPE 6 mo, also office visit at her convenience for toenail removal.  Signed:  Crissie Sickles, MD           06/15/2019

## 2019-06-16 ENCOUNTER — Other Ambulatory Visit: Payer: Self-pay

## 2019-06-16 MED ORDER — LEVEMIR FLEXTOUCH 100 UNIT/ML ~~LOC~~ SOPN: PEN_INJECTOR | SUBCUTANEOUS | 0 refills | Status: DC

## 2019-06-23 DIAGNOSIS — Z981 Arthrodesis status: Secondary | ICD-10-CM | POA: Diagnosis not present

## 2019-06-25 ENCOUNTER — Other Ambulatory Visit: Payer: Self-pay | Admitting: Family Medicine

## 2019-06-28 NOTE — Telephone Encounter (Signed)
Requesting: Clonazepam Contract:06/15/19 UDS:12/03/18 Last Visit:06/15/19 Next Visit: 08/18/19 Last Refill:02/22/19(90,3) taking tid  Other two medications currently pending as is.

## 2019-07-22 ENCOUNTER — Other Ambulatory Visit: Payer: Self-pay | Admitting: Family Medicine

## 2019-07-23 ENCOUNTER — Other Ambulatory Visit: Payer: Self-pay | Admitting: Family Medicine

## 2019-08-07 ENCOUNTER — Encounter: Payer: Self-pay | Admitting: Family Medicine

## 2019-08-18 ENCOUNTER — Ambulatory Visit: Payer: Medicare HMO | Admitting: Family Medicine

## 2019-09-08 ENCOUNTER — Ambulatory Visit: Payer: Medicare HMO | Admitting: "Endocrinology

## 2019-10-01 ENCOUNTER — Other Ambulatory Visit: Payer: Self-pay | Admitting: Family Medicine

## 2019-10-19 ENCOUNTER — Other Ambulatory Visit: Payer: Self-pay | Admitting: Family Medicine

## 2019-11-02 ENCOUNTER — Other Ambulatory Visit: Payer: Self-pay | Admitting: Family Medicine

## 2019-11-03 DIAGNOSIS — M5412 Radiculopathy, cervical region: Secondary | ICD-10-CM | POA: Diagnosis not present

## 2019-11-17 ENCOUNTER — Encounter: Payer: Self-pay | Admitting: Family Medicine

## 2019-11-17 ENCOUNTER — Other Ambulatory Visit: Payer: Self-pay | Admitting: Family Medicine

## 2019-11-24 ENCOUNTER — Ambulatory Visit: Payer: Medicare HMO

## 2019-12-03 ENCOUNTER — Telehealth: Payer: Self-pay

## 2019-12-03 ENCOUNTER — Other Ambulatory Visit: Payer: Self-pay | Admitting: Family Medicine

## 2019-12-03 NOTE — Telephone Encounter (Signed)
Patient needs new referral to Dr. Dorris Fetch. They will not schedule from old referral.  Dept code is [facility].

## 2019-12-03 NOTE — Telephone Encounter (Signed)
Please advise, see Diane's message

## 2019-12-20 ENCOUNTER — Encounter: Payer: Medicare HMO | Admitting: Family Medicine

## 2019-12-22 ENCOUNTER — Other Ambulatory Visit: Payer: Self-pay | Admitting: Family Medicine

## 2019-12-23 MED ORDER — LEVEMIR FLEXTOUCH 100 UNIT/ML ~~LOC~~ SOPN: PEN_INJECTOR | SUBCUTANEOUS | 0 refills | Status: DC

## 2019-12-23 MED ORDER — BD PEN NEEDLE NANO U/F 32G X 4 MM MISC: 0 refills | Status: DC

## 2019-12-23 MED ORDER — CYANOCOBALAMIN 1000 MCG/ML IJ SOLN: INTRAMUSCULAR | 0 refills | Status: DC

## 2019-12-29 ENCOUNTER — Encounter: Payer: Self-pay | Admitting: Family Medicine

## 2019-12-29 DIAGNOSIS — Z6833 Body mass index (BMI) 33.0-33.9, adult: Secondary | ICD-10-CM | POA: Diagnosis not present

## 2019-12-29 DIAGNOSIS — M5412 Radiculopathy, cervical region: Secondary | ICD-10-CM | POA: Diagnosis not present

## 2019-12-29 DIAGNOSIS — R030 Elevated blood-pressure reading, without diagnosis of hypertension: Secondary | ICD-10-CM | POA: Diagnosis not present

## 2019-12-29 DIAGNOSIS — Z9889 Other specified postprocedural states: Secondary | ICD-10-CM | POA: Diagnosis not present

## 2019-12-31 ENCOUNTER — Encounter: Payer: Medicare HMO | Admitting: Family Medicine

## 2019-12-31 NOTE — Progress Notes (Deleted)
Office Note 12/31/2019  CC: No chief complaint on file.   HPI:  Diana [name] is a 64 y.o. White female who is here for annual health maintenance exam and f/u bipolar d/o, severe chronic anxiety, adult ADD, DM and has many other chronic medical conditions. Her anxiety/agoraphobia has historically precluded adequate medical f/u.    PMP AWARE reviewed today: most recent rx for *** was filled ***, # ***, rx by ***. No red flags.  Past Medical History:  Diagnosis Date  . Abnormal liver diagnostic imaging 05/20/2017   CT abd->cirrhosis with atypical lesions that MRI f/u determined to be focal regions of severe steatosis.  . Abnormal mammogram 12/2014   F/u diagnostic mammo and u/s NORMAL--resume annual screening mammography  . Arthritis   . Bipolar disorder ([place])    questionable  . Cervical radiculopathy    Post cerv fusion C4-5, C5-6--stable as of 06/2019 NS f/u.  Marland Kitchen Chronic fatigue   . Chronic nausea 2012/13   Normal upper GI  12/2011 (mild GERD noted +small hiatal hernia)+  . Chronic pain syndrome    Chronic C spine pain with radiculopathy.  Dr. Francesco Runner at [facility] was her pain mgmt MD and he discharged her from [facility] pain mgmt center b/c she failed a UDS (+marijuana) 04/27/15. Dr. Trenton Gammon referred her to pain mgmt 2021.  Marland Kitchen Chronic renal insufficiency, stage II (mild) 02/2015   GFR 70s  . Cirrhosis, nonalcoholic ([place])    NAFLD->CT 2019 showed cirrhotic changes and f/u MRI showed benign areas of signif increased/severe focal steatosis.  Spleen normal 2019.  . Colon cancer screening 03/06/2011   iFob 11/2010 through her insurer was negative. iFOB 01/2015 NEG.  . Depression   . Diabetes mellitus    urine protein-Cr ratio nl 09/2010, D.R. Screen neg 10/2009  . Fibromyalgia   . Fracture of rib of left side 08/14/15   5th and 6th  . Hepatic steatosis 2005   ultrasound.  Also, 07/2017 MRI abd--> plus a few focal areas of more severe steatosis.  Marland Kitchen History of kidney stones   . 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 Hyperlipidemia, mixed 2017; 03/2018   Recommended statin 03/2018  . Insomnia   . Iron deficiency anemia 09/2009   malabsorbtion s/p bariatric surgery  . Nephrolithiasis 11/2012  . Neurosis, anxiety, panic type   . Obesity    s/p bariatric surgery  . Osteopenia 12/2010   Hip T score -2.0.  Spine T score -0.5 (plan to repeat in 2 yrs)  . Other B-complex deficiencies   . Panic attacks   . Restless legs syndrome   . Syncopal episodes    7/73/17 in the setting of dehydration and sedative medication  . Tobacco dependence   . Vitamin B12 deficiency   . Vitamin D deficiency 09/2009  . Wears dentures    uppers and lowers    Past Surgical History:  Procedure Laterality Date  . ABDOMINAL HYSTERECTOMY     fibroids, ovaries remain  . APPENDECTOMY    . BACK SURGERY    . BLADDER SURGERY     bladder tack  . CERVICAL SPINE SURGERY  09/2009; 8413;2440   2014-ant cerv decomp (Dr. Carloyn Manner). 2018 fusion (Dr. Annette Stable)  . CHOLECYSTECTOMY  04/2010   w/lysis of adhesions (open procedure)--path showed chronic cholecystitis and cholesterol polyp.  . CT CERVICAL SPINE W CONTRAST  ([facility])  06/2019   No evidence of hardware complication or loosening of her instrumentation.  . CYSTOSCOPY/RETROGRADE/URETEROSCOPY/STONE EXTRACTION WITH BASKET  Left 11/21/2012   Left UVJ stone.  Procedure: CYSTOSCOPY/RETROGRADE/URETEROSCOPY/STONE EXTRACTION WITH BASKET insertion double j stent;  Surgeon: Ailene Rud, MD;  Location: [facility];  Service: Urology;  Laterality: Left;  . EPIDURAL BLOCK INJECTION  03/2011   Cervical  . HERNIA REPAIR  2005   mesenteric hernia repair, with lysis of adhesions (presented with SBO)  . POSTERIOR CERVICAL FUSION/FORAMINOTOMY N/A 12/31/2016   Procedure: Posterior Cervical Fusion with lateral mass fixation - Cervical four-five, Cervical five-six;  Surgeon: Earnie Larsson, MD;  Location: [place];  Service: Neurosurgery;  Laterality: N/A;  . ROUX-EN-Y GASTRIC  BYPASS    . TONSILLECTOMY      Family History  Problem Relation Age of Onset  . Heart disease Mother   . Cancer Mother   . Heart disease Father   . Cancer Sister   . Heart disease Sister   . Alzheimer's disease Brother   . Kidney disease Brother     Social History   Socioeconomic History  . Marital status: Divorced    Spouse name: Not on file  . Number of children: Not on file  . Years of education: Not on file  . Highest education level: Not on file  Occupational History  . Not on file  Tobacco Use  . Smoking status: Former Smoker    Packs/day: 1.00    Years: 1.00    Pack years: 1.00    Types: Cigarettes    Quit date: 04/11/2016    Years since quitting: 3.7  . Smokeless tobacco: Never Used  Vaping Use  . Vaping Use: Never used  Substance and Sexual Activity  . Alcohol use: No  . Drug use: No  . Sexual activity: Not on file  Other Topics Concern  . Not on file  Social History Narrative   Lives with longtime boyfriend in [place], [place].   Had a stepson but he committed suicide 2012/13.   Disabled due to anxiety.   Longtime smoker, quits frequently but restarts.   No alcohol or illegal drugs.   No exercise.   Social Determinants of Health   Financial Resource Strain:   . Difficulty of Paying Living Expenses: Not on file  Food Insecurity:   . Worried About Charity fundraiser in the Last Year: Not on file  . Ran Out of Food in the Last Year: Not on file  Transportation Needs:   . Lack of Transportation (Medical): Not on file  . Lack of Transportation (Non-Medical): Not on file  Physical Activity:   . Days of Exercise per Week: Not on file  . Minutes of Exercise per Session: Not on file  Stress:   . Feeling of Stress : Not on file  Social Connections:   . Frequency of Communication with Friends and Family: Not on file  . Frequency of Social Gatherings with Friends and Family: Not on file  . Attends Religious Services: Not on file  . Active Member of  Clubs or Organizations: Not on file  . Attends Archivist Meetings: Not on file  . Marital Status: Not on file  Intimate Partner Violence:   . Fear of Current or Ex-Partner: Not on file  . Emotionally Abused: Not on file  . Physically Abused: Not on file  . Sexually Abused: Not on file    Outpatient Medications Prior to Visit  Medication Sig Dispense Refill  . amphetamine-dextroamphetamine (ADDERALL) 10 MG tablet 1 tab po bid 180 tablet 0  . atorvastatin (LIPITOR) 20 MG  tablet TAKE (1) TABLET BY MOUTH ONCE DAILY. 90 tablet 3  . B-D ULTRAFINE III SHORT PEN 31G X 8 MM MISC USE AS DIRECTED UP TO 5 TIMES DAILY. 100 each 3  . calcium elemental as carbonate (TUMS ULTRA 1000) 400 MG chewable tablet Chew 1,000 mg by mouth daily.      . Cholecalciferol 2000 UNITS CAPS Take 1 capsule (2,000 Units total) by mouth daily. (Patient taking differently: Take 1 capsule by mouth daily. VIT D) 30 each 11  . clonazePAM (KLONOPIN) 2 MG tablet TAKE (1) TABLET BY MOUTH (3) TIMES DAILY. 90 tablet 5  . cyanocobalamin (,VITAMIN B-12,) 1000 MCG/ML injection INJECT 1ML INTO THE MUSCLE EVERY 14 DAYS. 2 mL 0  . DULoxetine (CYMBALTA) 30 MG capsule 1 cap po qd 90 capsule 3  . FERROUS SULFATE PO Take by mouth.    . gabapentin (NEURONTIN) 300 MG capsule 4 caps po qhs 360 capsule 3  . HYDROcodone-acetaminophen (NORCO) 10-325 MG tablet Take 1-1.5 tablets by mouth every 4 (four) hours as needed for moderate pain or severe pain.    Marland Kitchen insulin detemir (LEVEMIR FLEXTOUCH) 100 UNIT/ML FlexPen INJECT 44 UNITS INTO THE SKIN IN THE MORNING AND 66 UNITS IN THE EVENING. 30 mL 0  . Insulin Pen Needle (B-D ULTRAFINE III SHORT PEN) 31G X 8 MM MISC USE AS DIRECTED UP TO 5 TIMES DAILY. 100 each 3  . Insulin Pen Needle (BD PEN NEEDLE NANO U/F) 32G X 4 MM MISC USE AS DIRECTED UP TO 5 TIMES DAILY. 100 each 0  . lamoTRIgine (LAMICTAL) 100 MG tablet 1 tab po qd 90 tablet 3  . mirtazapine (REMERON) 30 MG tablet TAKE (1) TABLET BY MOUTH  AT BEDTIME. 90 tablet 3  . NOVOLOG FLEXPEN 100 UNIT/ML FlexPen INJECT 18 UNITS UNDER THE SKIN BEFORE MEALS. 15 mL 3  . ondansetron (ZOFRAN) 4 MG tablet TAKE 1 TABLET BY MOUTH EVERY 6 HOURS AS NEEDED FOR NAUSEA. 20 tablet 0  . tiZANidine (ZANAFLEX) 4 MG tablet TAKE (1) TABLET BY MOUTH TWICE DAILY. 180 tablet 3   No facility-administered medications prior to visit.    Allergies  Allergen Reactions  . Aspirin Other (See Comments)    Gastric Bypass  . Latex Other (See Comments)    blisters  . Penicillins     UNSPECIFIED REACTION >"Took large quantities as a child & told to never take again Has patient had a PCN reaction causing immediate rash, facial/tongue/throat swelling, SOB or lightheadedness with hypotension: No Has patient had a PCN reaction causing severe rash involving mucus membranes or skin necrosis: No Has patient had a PCN reaction that required hospitalization: No Has patient had a PCN reaction occurring within the last 10 years: No If all of the above answers are "NO", then may proceed wi  . Codeine Itching    ROS *** PE; Vitals with BMI 06/15/2019 04/01/2018 09/24/2017  Height 5' 2"  5' 2"  5' 2"   Weight 193 lbs 13 oz 194 lbs 184 lbs 2 oz  BMI 35.44 82.70 78.67  Systolic 544 920 100  Diastolic 80 83 85  Pulse 712 98 98     *** Pertinent labs:  Lab Results  Component Value Date   TSH 2.43 04/01/2018   Lab Results  Component Value Date   WBC 7.4 04/01/2018   HGB 13.9 04/01/2018   HCT 41.3 04/01/2018   MCV 95.5 04/01/2018   PLT 282.0 04/01/2018   Lab Results  Component Value Date   IRON 80  04/01/2018   IRON 75 04/01/2018   TIBC 314 04/01/2018   FERRITIN 67.3 04/01/2018   Lab Results  Component Value Date   VITAMINB12 493 07/09/2017   Lab Results  Component Value Date   CREATININE 0.88 06/15/2019   BUN 15 06/15/2019   NA 134 (L) 06/15/2019   K 5.2 No hemolysis seen (H) 06/15/2019   CL 99 06/15/2019   CO2 31 06/15/2019   Lab Results  Component  Value Date   ALT 32 06/15/2019   AST 35 06/15/2019   ALKPHOS 105 06/15/2019   BILITOT 0.4 06/15/2019   Lab Results  Component Value Date   CHOL 148 06/15/2019   Lab Results  Component Value Date   HDL 40.50 06/15/2019   Lab Results  Component Value Date   LDLCALC 90 06/15/2019   Lab Results  Component Value Date   TRIG 89.0 06/15/2019   Lab Results  Component Value Date   CHOLHDL 4 06/15/2019   Lab Results  Component Value Date   HGBA1C 9.2 (H) 06/15/2019   ASSESSMENT AND PLAN:   No problem-specific Assessment & Plan notes found for this encounter.   An After Visit Summary was printed and given to the patient.  FOLLOW UP:  No follow-ups on file.  Signed:  Crissie Sickles, MD           12/31/2019

## 2020-01-04 ENCOUNTER — Other Ambulatory Visit: Payer: Self-pay | Admitting: Family Medicine

## 2020-01-05 ENCOUNTER — Other Ambulatory Visit: Payer: Self-pay

## 2020-01-05 ENCOUNTER — Other Ambulatory Visit: Payer: Self-pay | Admitting: Family Medicine

## 2020-01-05 MED ORDER — BD PEN NEEDLE SHORT U/F 31G X 8 MM MISC: 0 refills | Status: DC

## 2020-01-05 MED ORDER — BD PEN NEEDLE NANO U/F 32G X 4 MM MISC: 0 refills | Status: DC

## 2020-01-05 MED ORDER — CYANOCOBALAMIN 1000 MCG/ML IJ SOLN: INTRAMUSCULAR | 1 refills | Status: DC

## 2020-01-05 MED ORDER — LEVEMIR FLEXTOUCH 100 UNIT/ML ~~LOC~~ SOPN: PEN_INJECTOR | SUBCUTANEOUS | 0 refills | Status: DC

## 2020-01-05 MED ORDER — NOVOLOG FLEXPEN 100 UNIT/ML ~~LOC~~ SOPN: PEN_INJECTOR | SUBCUTANEOUS | 1 refills | Status: DC

## 2020-01-05 NOTE — Telephone Encounter (Signed)
Rx sent 

## 2020-01-05 NOTE — Telephone Encounter (Signed)
Requesting: clonazepam Contract:06/15/19 UDS:12/03/18 Last Visit:06/15/19 Next Visit:03/13/20 Last Refill: 06/28/19 (90,5)  Please Advise

## 2020-01-05 NOTE — Telephone Encounter (Signed)
Patient has next appt scheduled 03/13/2020 for physical. She is requesting the following refills to get her through: Levimir - 3 months Novalog - 3 months Klonopin -3 months Flextouch - 3 months B12 - 3 months Please send to same [facility].

## 2020-01-10 ENCOUNTER — Telehealth: Payer: Self-pay

## 2020-01-10 DIAGNOSIS — E119 Type 2 diabetes mellitus without complications: Secondary | ICD-10-CM

## 2020-01-10 NOTE — Telephone Encounter (Signed)
I sent in 1 mo supply with 1 RF. If pt misses 03/14/19 appt I won't RF this med any further.

## 2020-01-10 NOTE — Telephone Encounter (Signed)
Patient had referral placed to Dr.Nida on 06/15/19. Referral is no longer good, status currently shows "unable to reschedule". She would like new referral placed and was told a new referral was needed.  Okay for referral?

## 2020-01-10 NOTE — Telephone Encounter (Signed)
Patient advised refill sent.

## 2020-01-11 ENCOUNTER — Encounter: Payer: Self-pay | Admitting: Family Medicine

## 2020-01-11 NOTE — Telephone Encounter (Signed)
Patient was advised new referral entered for Dr.Nida. She will call their office and see what can be done.

## 2020-01-11 NOTE — Telephone Encounter (Signed)
OK, referral ordered

## 2020-02-21 ENCOUNTER — Other Ambulatory Visit: Payer: Self-pay | Admitting: Family Medicine

## 2020-03-13 ENCOUNTER — Encounter: Payer: Medicare HMO | Admitting: Family Medicine

## 2020-03-20 ENCOUNTER — Ambulatory Visit: Payer: Medicare HMO | Admitting: "Endocrinology

## 2020-03-22 ENCOUNTER — Other Ambulatory Visit: Payer: Self-pay | Admitting: Family Medicine

## 2020-03-22 DIAGNOSIS — F909 Attention-deficit hyperactivity disorder, unspecified type: Secondary | ICD-10-CM

## 2020-03-22 DIAGNOSIS — Z79899 Other long term (current) drug therapy: Secondary | ICD-10-CM

## 2020-03-24 NOTE — Telephone Encounter (Signed)
Patient was last seen on 06/15/19, has upcoming appt on 03/29/20 which she was already told she needs to keep for further refills.   Please decline any early refills until patient has been seen. Medications pending as refused

## 2020-03-27 NOTE — Telephone Encounter (Signed)
Pt is scheduled on 2/16 for f/u appt

## 2020-03-27 NOTE — Telephone Encounter (Signed)
Rx's sent. Must have f/u prior to any FURTHER RFs.

## 2020-03-28 ENCOUNTER — Other Ambulatory Visit: Payer: Self-pay

## 2020-03-28 DIAGNOSIS — Z9889 Other specified postprocedural states: Secondary | ICD-10-CM | POA: Diagnosis not present

## 2020-03-28 DIAGNOSIS — G894 Chronic pain syndrome: Secondary | ICD-10-CM | POA: Diagnosis not present

## 2020-03-28 DIAGNOSIS — Z6833 Body mass index (BMI) 33.0-33.9, adult: Secondary | ICD-10-CM | POA: Diagnosis not present

## 2020-03-28 DIAGNOSIS — R030 Elevated blood-pressure reading, without diagnosis of hypertension: Secondary | ICD-10-CM | POA: Diagnosis not present

## 2020-03-28 NOTE — Progress Notes (Signed)
Subjective:   Diana [name] is a 66 y.o. female who presents for Medicare Annual (Subsequent) preventive examination.  Review of Systems     Cardiac Risk Factors include: diabetes mellitus;dyslipidemia;obesity (BMI >30kg/m2);sedentary lifestyle     Objective:    Today's Vitals   03/29/20 1245 03/29/20 1249  BP: (!) 142/70   Pulse: (!) 110   Resp: 16   Temp: 98.2 F (36.8 C)   TempSrc: Oral   SpO2: 95%   Weight: 191 lb 6.4 oz (86.8 kg)   Height: 5' 2"  (1.575 m)   PainSc:  4    Body mass index is 35.01 kg/m.  Advanced Directives 05/20/2017 05/19/2017 12/26/2016 10/11/2016 08/14/2015 11/21/2012 11/20/2012  Does Patient Have a Medical Advance Directive? No No No No No Patient does not have advance directive;Patient would not like information Patient does not have advance directive;Patient would not like information  Would patient like information on creating a medical advance directive? No - Patient declined No - Patient declined Yes (MAU/Ambulatory/Procedural Areas - Information given) Yes (MAU/Ambulatory/Procedural Areas - Information given) - - -  Pre-existing out of facility DNR order (yellow form or pink MOST form) - - - - - No No    Current Medications (verified) Outpatient Encounter Medications as of 03/29/2020  Medication Sig  . amphetamine-dextroamphetamine (ADDERALL) 10 MG tablet TAKE (1) TABLET BY MOUTH TWICE DAILY.  Marland Kitchen atorvastatin (LIPITOR) 20 MG tablet TAKE (1) TABLET BY MOUTH ONCE DAILY.  . BD PEN NEEDLE NANO U/F 32G X 4 MM MISC USE AS DIRECTED UP TO 5 TIMES DAILY.  Marland Kitchen Blood Glucose Monitoring Suppl (ACCU-CHEK GUIDE ME) w/Device KIT 1 Piece by Does not apply route as directed.  . calcium elemental as carbonate (TUMS ULTRA 1000) 400 MG chewable tablet Chew 1,000 mg by mouth daily.  . Cholecalciferol 2000 UNITS CAPS Take 1 capsule (2,000 Units total) by mouth daily. (Patient taking differently: Take 1 capsule by mouth daily. VIT D)  . clonazePAM (KLONOPIN) 2 MG  tablet TAKE (1) TABLET BY MOUTH (3) TIMES DAILY.  . cyanocobalamin (,VITAMIN B-12,) 1000 MCG/ML injection INJECT 1ML INTO THE MUSCLE EVERY 14 DAYS.  . DULoxetine (CYMBALTA) 30 MG capsule 1 cap po qd  . FERROUS SULFATE PO Take by mouth.  . gabapentin (NEURONTIN) 300 MG capsule TAKE 4 CAPSULES BY MOUTH AT BEDTIME.  Marland Kitchen glucose blood (ACCU-CHEK GUIDE) test strip Use as instructed  . HYDROcodone-acetaminophen (NORCO) 10-325 MG tablet Take 1-1.5 tablets by mouth 3 (three) times daily as needed for moderate pain or severe pain.  Marland Kitchen insulin aspart (NOVOLOG FLEXPEN) 100 UNIT/ML FlexPen Inject 10-16 Units into the skin 3 (three) times daily before meals.  . insulin detemir (LEVEMIR FLEXTOUCH) 100 UNIT/ML FlexPen Inject 60 Units into the skin at bedtime.  . lamoTRIgine (LAMICTAL) 100 MG tablet 1 tab po qd  . metFORMIN (GLUCOPHAGE XR) 500 MG 24 hr tablet Take 1 tablet (500 mg total) by mouth daily with breakfast.  . mirtazapine (REMERON) 30 MG tablet TAKE (1) TABLET BY MOUTH AT BEDTIME.  Marland Kitchen ondansetron (ZOFRAN) 4 MG tablet TAKE 1 TABLET BY MOUTH EVERY 6 HOURS AS NEEDED FOR NAUSEA.  Marland Kitchen tiZANidine (ZANAFLEX) 4 MG tablet TAKE (1) TABLET BY MOUTH TWICE DAILY. (Patient taking differently: 2 (two) times daily as needed.)  . [DISCONTINUED] B-D ULTRAFINE III SHORT PEN 31G X 8 MM MISC USE AS DIRECTED UP TO 5 TIMES DAILY.  . [DISCONTINUED] insulin aspart (NOVOLOG FLEXPEN) 100 UNIT/ML FlexPen INJECT 18 UNITS UNDER THE SKIN BEFORE  MEALS.  . [DISCONTINUED] Insulin Pen Needle (B-D ULTRAFINE III SHORT PEN) 31G X 8 MM MISC USE AS DIRECTED UP TO 5 TIMES DAILY.  . [DISCONTINUED] LEVEMIR FLEXTOUCH 100 UNIT/ML FlexPen INJECT 44 UNITS INTO THE SKIN IN THE MORNING AND 72 UNITS IN THE EVENING.   No facility-administered encounter medications on file as of 03/29/2020.    Allergies (verified) Aspirin, Latex, Penicillins, Nickel, and Codeine   History: Past Medical History:  Diagnosis Date  . Abnormal liver diagnostic imaging  05/20/2017   CT abd->cirrhosis with atypical lesions that MRI f/u determined to be focal regions of severe steatosis.  . Abnormal mammogram 12/2014   F/u diagnostic mammo and u/s NORMAL--resume annual screening mammography  . Arthritis   . Bipolar disorder ([place])    questionable  . Cervical radiculopathy    Post cerv fusion C4-5, C5-6--stable as of 06/2019 NS f/u.  Marland Kitchen Chronic fatigue   . Chronic nausea 2012/13   Normal upper GI  12/2011 (mild GERD noted +small hiatal hernia)+  . Chronic pain syndrome    Chronic C spine pain with radiculopathy.  Dr. Francesco Runner at [facility] was her pain mgmt MD and he discharged her from [facility] pain mgmt center b/c she failed a UDS (+marijuana) 04/27/15. Dr. Trenton Gammon referred her to pain mgmt 2021.  Marland Kitchen Chronic renal insufficiency, stage II (mild) 02/2015   GFR 70s  . Colon cancer screening 03/06/2011   iFob 11/2010 through her insurer was negative. iFOB 01/2015 NEG.  . Depression   . Diabetes mellitus    urine protein-Cr ratio nl 09/2010, D.R. Screen neg 10/2009  . Fibromyalgia   . Fracture of rib of left side 08/14/15   5th and 6th  . Hepatic steatosis 2005   ultrasound.  Also, 07/2017 MRI abd--> plus a few focal areas of more severe steatosis.  Marland Kitchen History of kidney stones   . 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 Hyperlipidemia, mixed 2017; 03/2018   Recommended statin 03/2018  . Insomnia   . Iron deficiency anemia 09/2009   malabsorbtion s/p bariatric surgery  . NAFLD (nonalcoholic fatty liver disease)    NAFLD->CT 2019 showed cirrhotic changes and f/u MRI showed benign areas of signif increased/severe focal steatosis.  Spleen normal 2019.  . Nephrolithiasis 11/2012  . Neurosis, anxiety, panic type   . Obesity    s/p bariatric surgery  . Osteopenia 12/2010   Hip T score -2.0.  Spine T score -0.5 (plan to repeat in 2 yrs)  . Other B-complex deficiencies   . Panic attacks   . Restless legs syndrome   . Syncopal episodes    7/73/17 in the setting of  dehydration and sedative medication  . Tobacco dependence   . Vitamin B12 deficiency   . Vitamin D deficiency 09/2009  . Wears dentures    uppers and lowers   Past Surgical History:  Procedure Laterality Date  . ABDOMINAL HYSTERECTOMY     fibroids, ovaries remain  . APPENDECTOMY    . BACK SURGERY    . BLADDER SURGERY     bladder tack  . CERVICAL SPINE SURGERY  09/2009; 0938;1829   2014-ant cerv decomp (Dr. Carloyn Manner). 2018 fusion (Dr. Annette Stable)  . CHOLECYSTECTOMY  04/2010   w/lysis of adhesions (open procedure)--path showed chronic cholecystitis and cholesterol polyp.  . CT CERVICAL SPINE W CONTRAST  ([facility])  06/2019   No evidence of hardware complication or loosening of her instrumentation.  . CYSTOSCOPY/RETROGRADE/URETEROSCOPY/STONE EXTRACTION WITH BASKET Left 11/21/2012   Left  UVJ stone.  Procedure: CYSTOSCOPY/RETROGRADE/URETEROSCOPY/STONE EXTRACTION WITH BASKET insertion double j stent;  Surgeon: Ailene Rud, MD;  Location: [facility];  Service: Urology;  Laterality: Left;  . EPIDURAL BLOCK INJECTION  03/2011   Cervical  . HERNIA REPAIR  2005   mesenteric hernia repair, with lysis of adhesions (presented with SBO)  . POSTERIOR CERVICAL FUSION/FORAMINOTOMY N/A 12/31/2016   Procedure: Posterior Cervical Fusion with lateral mass fixation - Cervical four-five, Cervical five-six;  Surgeon: Earnie Larsson, MD;  Location: [facility];  Service: Neurosurgery;  Laterality: N/A;  . ROUX-EN-Y GASTRIC BYPASS    . TONSILLECTOMY     Family History  Problem Relation Age of Onset  . Heart disease Mother   . Cancer Mother   . Hypertension Mother   . Hyperlipidemia Mother   . Stroke Mother   . Heart disease Father   . Hypertension Father   . Hyperlipidemia Father   . Cancer Sister   . Heart disease Sister   . Alzheimer's disease Brother   . Kidney disease Brother    Social History   Socioeconomic History  . Marital status: Significant Other    Spouse name: Not on file  . Number of children: Not  on file  . Years of education: Not on file  . Highest education level: Not on file  Occupational History  . Not on file  Tobacco Use  . Smoking status: Former Smoker    Packs/day: 1.00    Years: 1.00    Pack years: 1.00    Types: Cigarettes    Quit date: 04/11/2016    Years since quitting: 3.9  . Smokeless tobacco: Never Used  Vaping Use  . Vaping Use: Never used  Substance and Sexual Activity  . Alcohol use: No  . Drug use: No  . Sexual activity: Not on file  Other Topics Concern  . Not on file  Social History Narrative   Lives with longtime boyfriend in [place], [place].   Had a stepson but he committed suicide 2012/13.   Disabled due to anxiety.   Longtime smoker, quits frequently but restarts.   No alcohol or illegal drugs.   No exercise.   Social Determinants of Health   Financial Resource Strain: Low Risk   . Difficulty of Paying Living Expenses: Not hard at all  Food Insecurity: No Food Insecurity  . Worried About Charity fundraiser in the Last Year: Never true  . Ran Out of Food in the Last Year: Never true  Transportation Needs: No Transportation Needs  . Lack of Transportation (Medical): No  . Lack of Transportation (Non-Medical): No  Physical Activity: Inactive  . Days of Exercise per Week: 0 days  . Minutes of Exercise per Session: 0 min  Stress: Stress Concern Present  . Feeling of Stress : To some extent  Social Connections: Moderately Isolated  . Frequency of Communication with Friends and Family: More than three times a week  . Frequency of Social Gatherings with Friends and Family: Once a week  . Attends Religious Services: Never  . Active Member of Clubs or Organizations: No  . Attends Archivist Meetings: Never  . Marital Status: Living with partner    Tobacco Counseling Counseling given: Not Answered   Clinical Intake:  Pre-visit preparation completed: Yes  Pain : 0-10 Pain Score: 4  Pain Type: Chronic pain Pain Location:  Neck (arms, fingers, leg, lower back) Pain Onset: More than a month ago Pain Frequency: Constant  Nutritional Status: BMI > 30  Obese Nutritional Risks: None Diabetes: Yes CBG done?: No Did pt. bring in CBG monitor from home?: No  How often do you need to have someone help you when you read instructions, pamphlets, or other written materials from your doctor or pharmacy?: 1 - Never  Diabetes:  Is the patient diabetic?  Yes  If diabetic, was a CBG obtained today?  No  Did the patient bring in their glucometer from home?  No  How often do you monitor your CBG's? occasionally.   Financial Strains and Diabetes Management:  Are you having any financial strains with the device, your supplies or your medication? No .  Does the patient want to be seen by Chronic Care Management for management of their diabetes?  No  Would the patient like to be referred to a Nutritionist or for Diabetic Management?  No   Diabetic Exams:  Diabetic Eye Exam:  Overdue for diabetic eye exam. Pt has been advised about the importance in completing this exam. Patient to make an appt soon  Diabetic Foot Exam: Completed 06/15/2019 .     Interpreter Needed?: No  Information entered by :: Caroleen Hamman LPN   Activities of Daily Living In your present state of health, do you have any difficulty performing the following activities: 03/29/2020  Hearing? N  Vision? N  Difficulty concentrating or making decisions? Y  Comment occasionally forgets names  Walking or climbing stairs? N  Dressing or bathing? N  Doing errands, shopping? Y  Comment due to anxiety  Preparing Food and eating ? N  Using the Toilet? N  In the past six months, have you accidently leaked urine? Y  Comment occasionally  Do you have problems with loss of bowel control? N  Managing your Medications? N  Managing your Finances? N  Housekeeping or managing your Housekeeping? N  Some recent data might be hidden    Patient Care  Team: Tammi Sou, MD as PCP - Charlett Lango, MD as Consulting Physician (Neurosurgery)  Indicate any recent Medical Services you may have received from other than [facility] providers in the past year (date may be approximate).     Assessment:   This is a routine wellness examination for [place].  Hearing/Vision screen  Hearing Screening   125Hz  250Hz  500Hz  1000Hz  2000Hz  3000Hz  4000Hz  6000Hz  8000Hz   Right ear:           Left ear:           Comments: No issues  Vision Screening Comments: Wears glasses Last eye exam-several years ago   Dietary issues and exercise activities discussed: Current Exercise Habits: The patient does not participate in regular exercise at present, Exercise limited by: None identified  Goals    . Patient Stated     Try to get out of the house alone once a week    . Weight (lb) < 150 lb (68 kg)     Lose weight by decreasing carbs.       Depression Screen PHQ 2/9 Scores 03/29/2020 10/11/2016  PHQ - 2 Score 1 2  PHQ- 9 Score - 14    Fall Risk Fall Risk  03/29/2020 10/11/2016  Falls in the past year? 0 Yes  Comment - "passed out"  Number falls in past yr: 0 2 or more  Injury with Fall? 0 Yes  Follow up Falls prevention discussed Falls prevention discussed    FALL RISK PREVENTION PERTAINING TO THE HOME:  Any stairs  in or around the home? Yes  If so, are there any without handrails? No  Home free of loose throw rugs in walkways, pet beds, electrical cords, etc? Yes  Adequate lighting in your home to reduce risk of falls? Yes   ASSISTIVE DEVICES UTILIZED TO PREVENT FALLS:  Life alert? No  Use of a cane, walker or w/c? No  Grab bars in the bathroom? Yes  Shower chair or bench in shower? No  Elevated toilet seat or a handicapped toilet? No   TIMED UP AND GO:  Was the test performed? Yes .  Length of time to ambulate 10 feet: 9 sec.   Gait steady and fast without use of assistive device  Cognitive Function:Normal cognitive status  assessed by direct observation by this Nurse Health Advisor. No abnormalities found.          Immunizations Immunization History  Administered Date(s) Administered  . Influenza Split 11/02/2010  . Influenza Whole 02/11/2010  . Influenza,inj,Quad PF,6+ Mos 11/19/2012, 10/20/2014, 11/10/2015, 01/01/2017  . Influenza,inj,quad, With Preservative 01/16/2018  . Janssen (J&J) SARS-COV-2 Vaccination 05/05/2019, 12/29/2019  . Pneumococcal Polysaccharide-23 11/10/2015  . Td 02/12/2003  . Tdap 06/16/2013  . Zoster Recombinat (Shingrix) 06/15/2019, 09/11/2019    TDAP status: Up to date  Flu Vaccine status: Declined, Education has been provided regarding the importance of this vaccine but patient still declined. Advised may receive this vaccine at local pharmacy or Health Dept. Aware to provide a copy of the vaccination record if obtained from local pharmacy or Health Dept. Verbalized acceptance and understanding.  Pneumococcal vaccine status: Up to date  Covid-19 vaccine status: Completed vaccines  Qualifies for Shingles Vaccine? No   Zostavax completed No   Shingrix Completed?: Yes  Screening Tests Health Maintenance  Topic Date Due  . OPHTHALMOLOGY EXAM  12/19/2013  . INFLUENZA VACCINE  09/12/2019  . MAMMOGRAM  06/13/2020  . FOOT EXAM  06/14/2020  . URINE MICROALBUMIN  06/14/2020  . HEMOGLOBIN A1C  09/26/2020  . TETANUS/TDAP  06/17/2023  . COLONOSCOPY (Pts 45-69yr Insurance coverage will need to be confirmed)  02/12/2025  . PNEUMOCOCCAL POLYSACCHARIDE VACCINE AGE 63-64 HIGH RISK  Completed  . COVID-19 Vaccine  Completed  . Hepatitis C Screening  Completed  . HIV Screening  Completed    Health Maintenance  Health Maintenance Due  Topic Date Due  . OPHTHALMOLOGY EXAM  12/19/2013  . INFLUENZA VACCINE  09/12/2019    Colorectal cancer screening: Type of screening: Colonoscopy. Completed 02/13/2015. Repeat every 10 years  Mammogram status: Completed Bilateral-06/14/2019. Repeat  every year  Bone Density status: Not yet indicated   Lung Cancer Screening: (Low Dose CT Chest recommended if Age 64-80years, 30 pack-year currently smoking OR have quit w/in 15years.) does not qualify.     Additional Screening:  Hepatitis C Screening:Completed 07/09/2017  Vision Screening: Recommended annual ophthalmology exams for early detection of glaucoma and other disorders of the eye. Is the patient up to date with their annual eye exam?  No  Who is the provider or what is the name of the office in which the patient attends annual eye exams? [facility]  Dental Screening: Recommended annual dental exams for proper oral hygiene  Community Resource Referral / Chronic Care Management: CRR required this visit?  No   CCM required this visit?  No      Plan:     I have personally reviewed and noted the following in the patient's chart:   . Medical and social  history . Use of alcohol, tobacco or illicit drugs  . Current medications and supplements . Functional ability and status . Nutritional status . Physical activity . Advanced directives . List of other physicians . Hospitalizations, surgeries, and ER visits in previous 12 months . Vitals . Screenings to include cognitive, depression, and falls . Referrals and appointments  In addition, I have reviewed and discussed with patient certain preventive protocols, quality metrics, and best practice recommendations. A written personalized care plan for preventive services as well as general preventive health recommendations were provided to patient.   Patient to access avs on mychart.  Marta Antu, LPN   [id number]/89/[id number]  Nurse Health Advisor  Nurse Notes: None

## 2020-03-29 ENCOUNTER — Ambulatory Visit: Payer: Medicare HMO | Admitting: "Endocrinology

## 2020-03-29 ENCOUNTER — Ambulatory Visit (INDEPENDENT_AMBULATORY_CARE_PROVIDER_SITE_OTHER): Payer: Medicare HMO

## 2020-03-29 ENCOUNTER — Ambulatory Visit (INDEPENDENT_AMBULATORY_CARE_PROVIDER_SITE_OTHER): Payer: Medicare HMO | Admitting: Family Medicine

## 2020-03-29 ENCOUNTER — Encounter: Payer: Self-pay | Admitting: "Endocrinology

## 2020-03-29 ENCOUNTER — Encounter: Payer: Self-pay | Admitting: Family Medicine

## 2020-03-29 VITALS — BP 142/70 | HR 110 | Temp 98.2°F | Resp 16 | Ht 62.0 in | Wt 191.4 lb

## 2020-03-29 VITALS — BP 116/70 | HR 96 | Ht 62.0 in | Wt 192.6 lb

## 2020-03-29 VITALS — BP 127/73 | HR 105 | Temp 98.2°F | Resp 16 | Ht 62.0 in | Wt 191.4 lb

## 2020-03-29 DIAGNOSIS — E119 Type 2 diabetes mellitus without complications: Secondary | ICD-10-CM | POA: Diagnosis not present

## 2020-03-29 DIAGNOSIS — Z0000 Encounter for general adult medical examination without abnormal findings: Secondary | ICD-10-CM

## 2020-03-29 DIAGNOSIS — K7460 Unspecified cirrhosis of liver: Secondary | ICD-10-CM | POA: Diagnosis not present

## 2020-03-29 DIAGNOSIS — Z1211 Encounter for screening for malignant neoplasm of colon: Secondary | ICD-10-CM

## 2020-03-29 DIAGNOSIS — E118 Type 2 diabetes mellitus with unspecified complications: Secondary | ICD-10-CM | POA: Diagnosis not present

## 2020-03-29 DIAGNOSIS — Z79899 Other long term (current) drug therapy: Secondary | ICD-10-CM

## 2020-03-29 DIAGNOSIS — F339 Major depressive disorder, recurrent, unspecified: Secondary | ICD-10-CM | POA: Diagnosis not present

## 2020-03-29 DIAGNOSIS — M8580 Other specified disorders of bone density and structure, unspecified site: Secondary | ICD-10-CM

## 2020-03-29 DIAGNOSIS — E7800 Pure hypercholesterolemia, unspecified: Secondary | ICD-10-CM

## 2020-03-29 DIAGNOSIS — F411 Generalized anxiety disorder: Secondary | ICD-10-CM | POA: Diagnosis not present

## 2020-03-29 DIAGNOSIS — E2839 Other primary ovarian failure: Secondary | ICD-10-CM | POA: Diagnosis not present

## 2020-03-29 DIAGNOSIS — F988 Other specified behavioral and emotional disorders with onset usually occurring in childhood and adolescence: Secondary | ICD-10-CM

## 2020-03-29 LAB — TSH: TSH: 3.22 u[IU]/mL (ref 0.35–4.50)

## 2020-03-29 LAB — COMPREHENSIVE METABOLIC PANEL
ALT: 33 U/L (ref 0–35)
AST: 39 U/L — ABNORMAL HIGH (ref 0–37)
Albumin: 4.3 g/dL (ref 3.5–5.2)
Alkaline Phosphatase: 106 U/L (ref 39–117)
BUN: 14 mg/dL (ref 6–23)
CO2: 33 mEq/L — ABNORMAL HIGH (ref 19–32)
Calcium: 9.7 mg/dL (ref 8.4–10.5)
Chloride: 99 mEq/L (ref 96–112)
Creatinine, Ser: 0.87 mg/dL (ref 0.40–1.20)
GFR: 71 mL/min (ref >60.00)
Glucose, Bld: 100 mg/dL — ABNORMAL HIGH (ref 70–99)
Potassium: 4.3 mEq/L (ref 3.5–5.1)
Sodium: 139 mEq/L (ref 135–145)
Total Bilirubin: 0.4 mg/dL (ref 0.2–1.2)
Total Protein: 7.8 g/dL (ref 6.0–8.3)

## 2020-03-29 LAB — CBC WITH DIFFERENTIAL/PLATELET
Basophils Absolute: 0 10*3/uL (ref 0.0–0.1)
Basophils Relative: 0.6 % (ref 0.0–3.0)
Eosinophils Absolute: 0.2 10*3/uL (ref 0.0–0.7)
Eosinophils Relative: 2.7 % (ref 0.0–5.0)
HCT: 39.4 % (ref 36.0–46.0)
Hemoglobin: 13.2 g/dL (ref 12.0–15.0)
Lymphocytes Relative: 40.1 % (ref 12.0–46.0)
Lymphs Abs: 3 10*3/uL (ref 0.7–4.0)
MCHC: 33.5 g/dL (ref 30.0–36.0)
MCV: 94.9 fl (ref 78.0–100.0)
Monocytes Absolute: 0.5 10*3/uL (ref 0.1–1.0)
Monocytes Relative: 7 % (ref 3.0–12.0)
Neutro Abs: 3.7 10*3/uL (ref 1.4–7.7)
Neutrophils Relative %: 49.6 % (ref 43.0–77.0)
Platelets: 253 10*3/uL (ref 150.0–400.0)
RBC: 4.15 Mil/uL (ref 3.87–5.11)
RDW: 13.3 % (ref 11.5–15.5)
WBC: 7.4 10*3/uL (ref 4.0–10.5)

## 2020-03-29 LAB — POCT GLYCOSYLATED HEMOGLOBIN (HGB A1C): HbA1c, POC (controlled diabetic range): 9.3 % — AB (ref 0.0–7.0)

## 2020-03-29 MED ORDER — ACCU-CHEK GUIDE ME W/DEVICE KIT: 1.0000 | PACK | 0 refills | Status: DC

## 2020-03-29 MED ORDER — LEVEMIR FLEXTOUCH 100 UNIT/ML ~~LOC~~ SOPN: 60.0000 [IU] | PEN_INJECTOR | Freq: Every day | SUBCUTANEOUS | 1 refills | Status: DC

## 2020-03-29 MED ORDER — METFORMIN HCL ER 500 MG PO TB24: 500.0000 mg | ORAL_TABLET | Freq: Every day | ORAL | 1 refills | Status: DC

## 2020-03-29 MED ORDER — NOVOLOG FLEXPEN 100 UNIT/ML ~~LOC~~ SOPN: 10.0000 [IU] | PEN_INJECTOR | Freq: Three times a day (TID) | SUBCUTANEOUS | 2 refills | Status: DC

## 2020-03-29 MED ORDER — ACCU-CHEK GUIDE VI STRP: ORAL_STRIP | 2 refills | Status: DC

## 2020-03-29 NOTE — Progress Notes (Signed)
Office Note 03/29/2020  CC:  Chief Complaint  Patient presents with   Annual Exam    Pt is not fasting    HPI:  Diana [name] is a 65 y.o. white female who is here for annual health maintenance exam and f/u f/u HLD, chronic anxiety, adult ADD, MDD- recurrent. I last saw her 06/15/19. A/P as of that visit was: "1) DM 2, poor control, dietary noncompliance. Chronic R great toe ingrowing toenail, failing conservative mgmt--recommended removal of this nail. Feet exam normal otherwise normal. Ref to Dr. Dorris Fetch as per pt request. HbA1c, urine microalb/cr, lytes/cr today.  2) HLD: tolerating statin. LDL goal <70. FLP and hepatic panel today.  3) Chronic severe GAD, panic, agoraphobia, hx of recurrent MDD: has not done well at all with her GAD/agoraphobia/panic since covid pandemic.   Has gotten out of her home a total of 13 times since pandemic hit. Continue all current meds--I think she is at maximal functional level right now. Gave letter today stating she benefits from her dog for emotional support.  4) Adult ADD. The current medical regimen is effective;  continue present plan and medications. I did electronic rx adderall 1 bid, 180 (90 d supply) today.   CSC renewed today. UDS with approp results 11/2018. Plan rpt UDS at next f/u in 6 mo.  5) Preventative health.  She has had covid 19 vaccine x 2. Shingrix #1 today."  INTERIM HX: Feeling ok, just her usual anxiety at being out of the house. Overall, chronic anxiety levels pretty high but usual for her, stays home most of the time. Compliant with cymbalta, remeron, adderall, clonaz, lamictal. Appetite great, particularly evenings, wt is stable the last 9 mo.  No abd pain, no LE swelling.    She went to see endocrinologist, Dr. Dorris Fetch, today.  A1c was 9.3%. Novolog SSI qAC per pt, levemir changed to 60 qhs only, metformin rx'd, diet discussed.   Nutritionist in the plans.  Past Medical History:  Diagnosis Date    Abnormal liver diagnostic imaging 05/20/2017   CT abd->cirrhosis with atypical lesions that MRI f/u determined to be focal regions of severe steatosis.   Abnormal mammogram 12/2014   F/u diagnostic mammo and u/s NORMAL--resume annual screening mammography   Arthritis    Bipolar disorder ([place])    questionable   Cervical radiculopathy    Post cerv fusion C4-5, C5-6--stable as of 06/2019 NS f/u.   Chronic fatigue    Chronic nausea 2012/13   Normal upper GI  12/2011 (mild GERD noted +small hiatal hernia)+   Chronic pain syndrome    Chronic C spine pain with radiculopathy.  Dr. Francesco Runner at [facility] was her pain mgmt MD and he discharged her from [facility] pain mgmt center b/c she failed a UDS (+marijuana) 04/27/15. Dr. Trenton Gammon referred her to pain mgmt 2021.   Chronic renal insufficiency, stage II (mild) 02/2015   GFR 70s   Colon cancer screening 03/06/2011   iFob 11/2010 through her insurer was negative. iFOB 01/2015 NEG.   Depression    Diabetes mellitus    urine protein-Cr ratio nl 09/2010, D.R. Screen neg 10/2009   Fibromyalgia    Fracture of rib of left side 08/14/15   5th and 6th   Hepatic steatosis 2005   ultrasound.  Also, 07/2017 MRI abd--> plus a few focal areas of more severe steatosis.   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  Spleen normal 2019.   Nephrolithiasis 11/2012   Neurosis, anxiety, panic type    Obesity    s/p bariatric surgery   Osteopenia 12/2010   Hip T score -2.0.  Spine T score -0.5 (plan to repeat in 2 yrs)   Other B-complex deficiencies    Panic attacks    Restless legs syndrome    Syncopal episodes     7/73/17 in the setting of dehydration and sedative medication   Tobacco dependence    Vitamin B12 deficiency    Vitamin D deficiency 09/2009   Wears dentures    uppers and lowers    Past Surgical History:  Procedure Laterality Date   ABDOMINAL HYSTERECTOMY     fibroids, ovaries remain   APPENDECTOMY     BACK SURGERY     BLADDER SURGERY     bladder tack   CERVICAL SPINE SURGERY  09/2009; 3664;4034   2014-ant cerv decomp (Dr. Carloyn Manner). 2018 fusion (Dr. Annette Stable)   [place]  04/2010   w/lysis of adhesions (open procedure)--path showed chronic cholecystitis and cholesterol polyp.   CT CERVICAL SPINE W CONTRAST  ([place] [facility])  06/2019   No evidence of hardware complication or loosening of her instrumentation.   CYSTOSCOPY/RETROGRADE/URETEROSCOPY/STONE EXTRACTION WITH BASKET Left 11/21/2012   Left UVJ stone.  Procedure: CYSTOSCOPY/RETROGRADE/URETEROSCOPY/STONE EXTRACTION WITH BASKET insertion double j stent;  Surgeon: Ailene Rud, MD;  Location: [facility];  Service: Urology;  Laterality: Left;   EPIDURAL BLOCK INJECTION  03/2011   Cervical   HERNIA REPAIR  2005   mesenteric hernia repair, with lysis of adhesions (presented with SBO)   POSTERIOR CERVICAL FUSION/FORAMINOTOMY N/A 12/31/2016   Procedure: Posterior Cervical Fusion with lateral mass fixation - Cervical four-five, Cervical five-six;  Surgeon: Earnie Larsson, MD;  Location: [place];  Service: Neurosurgery;  Laterality: N/A;   ROUX-EN-Y GASTRIC BYPASS     TONSILLECTOMY      Family History  Problem Relation Age of Onset   Heart disease Mother    Cancer Mother    Hypertension Mother    Hyperlipidemia Mother    Stroke Mother    Heart disease Father    Hypertension Father    Hyperlipidemia Father    Cancer Sister    Heart disease Sister    Alzheimer's disease Brother    Kidney disease Brother     Social History   Socioeconomic History   Marital status: Significant Other    Spouse name: Not  on file   Number of children: Not on file   Years of education: Not on file   Highest education level: Not on file  Occupational History   Not on file  Tobacco Use   Smoking status: Former Smoker    Packs/day: 1.00    Years: 1.00    Pack years: 1.00    Types: Cigarettes    Quit date: 04/11/2016    Years since quitting: 3.9   Smokeless tobacco: Never Used  Vaping Use   Vaping Use: Never used  Substance and Sexual Activity   Alcohol use: No   Drug use: No   Sexual activity: Not on file  Other Topics Concern   Not on file  Social History Narrative   Lives with longtime  boyfriend in [place], [place].   Had a stepson but he committed suicide 2012/13.   Disabled due to anxiety.   Longtime smoker, quits frequently but restarts.   No alcohol or illegal drugs.   No exercise.   Social Determinants of Health   Financial Resource Strain: Low Risk    Difficulty of Paying Living Expenses: Not hard at all  Food Insecurity: No Food Insecurity   Worried About Charity fundraiser in the Last Year: Never true   [place] in the Last Year: Never true  Transportation Needs: No Transportation Needs   Lack of Transportation (Medical): No   Lack of Transportation (Non-Medical): No  Physical Activity: Inactive   Days of Exercise per Week: 0 days   Minutes of Exercise per Session: 0 min  Stress: Stress Concern Present   Feeling of Stress : To some extent  Social Connections: Moderately Isolated   Frequency of Communication with Friends and Family: More than three times a week   Frequency of Social Gatherings with Friends and Family: Once a week   Attends Religious Services: Never   Marine scientist or Organizations: No   Attends Music therapist: Never   Marital Status: Living with partner  Intimate Partner Violence: Not At Risk   Fear of Current or Ex-Partner: No   Emotionally Abused: No   Physically Abused: No   Sexually Abused: No     Outpatient Medications Prior to Visit  Medication Sig Dispense Refill   amphetamine-dextroamphetamine (ADDERALL) 10 MG tablet TAKE (1) TABLET BY MOUTH TWICE DAILY. 180 tablet 0   atorvastatin (LIPITOR) 20 MG tablet TAKE (1) TABLET BY MOUTH ONCE DAILY. 90 tablet 0   BD PEN NEEDLE NANO U/F 32G X 4 MM MISC USE AS DIRECTED UP TO 5 TIMES DAILY. 100 each 0   Blood Glucose Monitoring Suppl (ACCU-CHEK GUIDE ME) w/Device KIT 1 Piece by Does not apply route as directed. 1 kit 0   calcium elemental as carbonate (TUMS ULTRA 1000) 400 MG chewable tablet Chew 1,000 mg by mouth daily.     Cholecalciferol 2000 UNITS CAPS Take 1 capsule (2,000 Units total) by mouth daily. (Patient taking differently: Take 1 capsule by mouth daily. VIT D) 30 each 11   clonazePAM (KLONOPIN) 2 MG tablet TAKE (1) TABLET BY MOUTH (3) TIMES DAILY. 90 tablet 0   cyanocobalamin (,VITAMIN B-12,) 1000 MCG/ML injection INJECT 1ML INTO THE MUSCLE EVERY 14 DAYS. 2 mL 0   DULoxetine (CYMBALTA) 30 MG capsule 1 cap po qd 90 capsule 3   FERROUS SULFATE PO Take by mouth.     gabapentin (NEURONTIN) 300 MG capsule TAKE 4 CAPSULES BY MOUTH AT BEDTIME. 360 capsule 0   glucose blood (ACCU-CHEK GUIDE) test strip Use as instructed 150 each 2   HYDROcodone-acetaminophen (NORCO) 10-325 MG tablet Take 1-1.5 tablets by mouth 3 (three) times daily as needed for moderate pain or severe pain.     insulin aspart (NOVOLOG FLEXPEN) 100 UNIT/ML FlexPen Inject 10-16 Units into the skin 3 (three) times daily before meals. 15 mL 2   insulin detemir (LEVEMIR FLEXTOUCH) 100 UNIT/ML FlexPen Inject 60 Units into the skin at bedtime. 30 mL 1   lamoTRIgine (LAMICTAL) 100 MG tablet 1 tab po qd 90 tablet 3   metFORMIN (GLUCOPHAGE XR) 500 MG 24 hr tablet Take 1 tablet (500 mg total) by mouth daily with breakfast. 90 tablet 1   mirtazapine (REMERON) 30 MG tablet TAKE (1) TABLET BY  MOUTH AT BEDTIME. 90 tablet 0   ondansetron (ZOFRAN) 4 MG tablet TAKE 1  TABLET BY MOUTH EVERY 6 HOURS AS NEEDED FOR NAUSEA. 20 tablet 0   tiZANidine (ZANAFLEX) 4 MG tablet TAKE (1) TABLET BY MOUTH TWICE DAILY. (Patient taking differently: 2 (two) times daily as needed.) 180 tablet 3   No facility-administered medications prior to visit.    Allergies  Allergen Reactions   Aspirin Other (See Comments)    Gastric Bypass   Latex Other (See Comments)    blisters   Penicillins     UNSPECIFIED REACTION >"Took large quantities as a child & told to never take again Has patient had a PCN reaction causing immediate rash, facial/tongue/throat swelling, SOB or lightheadedness with hypotension: No Has patient had a PCN reaction causing severe rash involving mucus membranes or skin necrosis: No Has patient had a PCN reaction that required hospitalization: No Has patient had a PCN reaction occurring within the last 10 years: No If all of the above answers are "NO", then may proceed wi   Nickel    Codeine Itching    ROS Review of Systems  Constitutional: Negative for appetite change, chills, fatigue and fever.  HENT: Negative for congestion, dental problem, ear pain and sore throat.   Eyes: Negative for discharge, redness and visual disturbance.  Respiratory: Negative for cough, chest tightness, shortness of breath and wheezing.   Cardiovascular: Negative for chest pain, palpitations and leg swelling.  Gastrointestinal: Negative for abdominal pain, blood in stool, diarrhea, nausea and vomiting.  Genitourinary: Negative for difficulty urinating, dysuria, flank pain, frequency, hematuria and urgency.  Musculoskeletal: Positive for back pain (chronic neck and back pain). Negative for arthralgias, joint swelling, myalgias and neck stiffness.  Skin: Negative for pallor and rash.  Neurological: Negative for dizziness, speech difficulty, weakness and headaches.  Hematological: Negative for adenopathy. Does not bruise/bleed easily.  Psychiatric/Behavioral: Negative for  confusion and sleep disturbance. The patient is not nervous/anxious.     PE; Vitals with BMI 03/29/2020 03/29/2020 03/29/2020  Height _0  _1  _2   Weight 191 lbs 6 oz 191 lbs 6 oz 192 lbs 10 oz  BMI 35 35 00.17  Systolic 494 496 759  Diastolic 73 70 70  Pulse 163 110 96    Gen: Alert, well appearing.  Patient is oriented to person, place, time, and situation. AFFECT: pleasant, lucid thought and speech. ENT: Ears: EACs clear, normal epithelium.  TMs with good light reflex and landmarks bilaterally.  Eyes: no injection, icteris, swelling, or exudate.  EOMI, PERRLA. Nose: no drainage or turbinate edema/swelling.  No injection or focal lesion.  Mouth: lips without lesion/swelling.  Oral mucosa pink and moist.  Dentition intact and without obvious caries or gingival swelling.  Oropharynx without erythema, exudate, or swelling.  Neck: supple/nontender.  No LAD, mass, or TM.  Carotid pulses 2+ bilaterally, without bruits. CV: RRR, no m/r/g.   LUNGS: CTA bilat, nonlabored resps, good aeration in all lung fields. ABD: soft, NT, ND, BS normal.  No hepatospenomegaly or mass.  No bruits. EXT: no clubbing, cyanosis, or edema.  Musculoskeletal: no joint swelling, erythema, warmth, or tenderness.  ROM of all joints intact. Skin - no sores or suspicious lesions or rashes or color changes   Pertinent labs:  Lab Results  Component Value Date   TSH 2.43 04/01/2018   Lab Results  Component Value Date   WBC 7.4 04/01/2018   HGB 13.9 04/01/2018   HCT 41.3 04/01/2018   MCV  95.5 04/01/2018   PLT 282.0 04/01/2018   Lab Results  Component Value Date   CREATININE 0.88 06/15/2019   BUN 15 06/15/2019   NA 134 (L) 06/15/2019   K 5.2 No hemolysis seen (H) 06/15/2019   CL 99 06/15/2019   CO2 31 06/15/2019   Lab Results  Component Value Date   ALT 32 06/15/2019   AST 35 06/15/2019   ALKPHOS 105 06/15/2019   BILITOT 0.4 06/15/2019   Lab Results  Component Value Date   CHOL 148 06/15/2019    Lab Results  Component Value Date   HDL 40.50 06/15/2019   Lab Results  Component Value Date   LDLCALC 90 06/15/2019   Lab Results  Component Value Date   TRIG 89.0 06/15/2019   Lab Results  Component Value Date   CHOLHDL 4 06/15/2019   Lab Results  Component Value Date   HGBA1C 9.3 (A) 03/29/2020    ASSESSMENT AND PLAN:   1) Recurrent MDD, GAD/agoraphobia, adult ADD: all optimally medically treated on current regimen, no changes made today. CSC UTD. UDS obtained today: should show benzo, ampheta, hydrocodone.  2) NAFLD: cirrhotic changes on imaging in the past. Hepatic panel today, AFP today, order repeat liver u/s to monitor.  3) Health maintenance exam: Reviewed age and gender appropriate health maintenance issues (prudent diet, regular exercise, health risks of tobacco and excessive alcohol, use of seatbelts, fire alarms in home, use of sunscreen).  Also reviewed age and gender appropriate health screening as well as vaccine recommendations. Vaccines: Flu->pt declined.  Otherwise ALL UTD. Labs: cbc, cmet, tsh, afp. Cervical ca screening: n/a->pt is s/p hysterectomy for benign dx. Breast ca screening: mammogram neg 06/2019, rpt around 06/2020. Colon ca screening: has never had colon ca screening->cologuard ordered. Osteoporosis screening: DEXA 12/2010 osteopenic.  Due for rpt->ordered today.   An After Visit Summary was printed and given to the patient.  FOLLOW UP:  Return in about 3 months (around 06/26/2020) for routine chronic illness f/u.  Signed:  Crissie Sickles, MD           03/29/2020

## 2020-03-29 NOTE — Progress Notes (Signed)
Endocrinology Consult Note       03/29/2020, 1:04 PM   Subjective:    Patient ID: Diana [name], female    DOB: 03-13-1955.  [name] is being seen in consultation for management of currently uncontrolled symptomatic diabetes requested by  Tammi Sou, MD.   Past Medical History:  Diagnosis Date  . Abnormal liver diagnostic imaging 05/20/2017   CT abd->cirrhosis with atypical lesions that MRI f/u determined to be focal regions of severe steatosis.  . Abnormal mammogram 12/2014   F/u diagnostic mammo and u/s NORMAL--resume annual screening mammography  . Arthritis   . Bipolar disorder ([place])    questionable  . Cervical radiculopathy    Post cerv fusion C4-5, C5-6--stable as of 06/2019 NS f/u.  Marland Kitchen Chronic fatigue   . Chronic nausea 2012/13   Normal upper GI  12/2011 (mild GERD noted +small hiatal hernia)+  . Chronic pain syndrome    Chronic C spine pain with radiculopathy.  Dr. Francesco Runner at [facility] was her pain mgmt MD and he discharged her from [facility] pain mgmt center b/c she failed a UDS (+marijuana) 04/27/15. Dr. Trenton Gammon referred her to pain mgmt 2021.  Marland Kitchen Chronic renal insufficiency, stage II (mild) 02/2015   GFR 70s  . Colon cancer screening 03/06/2011   iFob 11/2010 through her insurer was negative. iFOB 01/2015 NEG.  . Depression   . Diabetes mellitus    urine protein-Cr ratio nl 09/2010, D.R. Screen neg 10/2009  . Fibromyalgia   . Fracture of rib of left side 08/14/15   5th and 6th  . Hepatic steatosis 2005   ultrasound.  Also, 07/2017 MRI abd--> plus a few focal areas of more severe steatosis.  Marland Kitchen History of kidney stones   . 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 Hyperlipidemia, mixed 2017; 03/2018   Recommended statin 03/2018  . Insomnia   . Iron deficiency anemia 09/2009   malabsorbtion s/p bariatric surgery  . NAFLD (nonalcoholic fatty liver disease)    NAFLD->CT 2019  showed cirrhotic changes and f/u MRI showed benign areas of signif increased/severe focal steatosis.  Spleen normal 2019.  . Nephrolithiasis 11/2012  . Neurosis, anxiety, panic type   . Obesity    s/p bariatric surgery  . Osteopenia 12/2010   Hip T score -2.0.  Spine T score -0.5 (plan to repeat in 2 yrs)  . Other B-complex deficiencies   . Panic attacks   . Restless legs syndrome   . Syncopal episodes    7/73/17 in the setting of dehydration and sedative medication  . Tobacco dependence   . Vitamin B12 deficiency   . Vitamin D deficiency 09/2009  . Wears dentures    uppers and lowers    Past Surgical History:  Procedure Laterality Date  . ABDOMINAL HYSTERECTOMY     fibroids, ovaries remain  . APPENDECTOMY    . BACK SURGERY    . BLADDER SURGERY     bladder tack  . CERVICAL SPINE SURGERY  09/2009; 3833;3832   2014-ant cerv decomp (Dr. Carloyn Manner). 2018 fusion (Dr. Annette Stable)  . CHOLECYSTECTOMY  04/2010   w/lysis of adhesions (  open procedure)--path showed chronic cholecystitis and cholesterol polyp.  . CT CERVICAL SPINE W CONTRAST  ([facility])  06/2019   No evidence of hardware complication or loosening of her instrumentation.  . CYSTOSCOPY/RETROGRADE/URETEROSCOPY/STONE EXTRACTION WITH BASKET Left 11/21/2012   Left UVJ stone.  Procedure: CYSTOSCOPY/RETROGRADE/URETEROSCOPY/STONE EXTRACTION WITH BASKET insertion double j stent;  Surgeon: Ailene Rud, MD;  Location: [facility];  Service: Urology;  Laterality: Left;  . EPIDURAL BLOCK INJECTION  03/2011   Cervical  . HERNIA REPAIR  2005   mesenteric hernia repair, with lysis of adhesions (presented with SBO)  . POSTERIOR CERVICAL FUSION/FORAMINOTOMY N/A 12/31/2016   Procedure: Posterior Cervical Fusion with lateral mass fixation - Cervical four-five, Cervical five-six;  Surgeon: Earnie Larsson, MD;  Location: [place];  Service: Neurosurgery;  Laterality: N/A;  . ROUX-EN-Y GASTRIC BYPASS    . TONSILLECTOMY      Social History   Socioeconomic  History  . Marital status: Significant Other    Spouse name: Not on file  . Number of children: Not on file  . Years of education: Not on file  . Highest education level: Not on file  Occupational History  . Not on file  Tobacco Use  . Smoking status: Former Smoker    Packs/day: 1.00    Years: 1.00    Pack years: 1.00    Types: Cigarettes    Quit date: 04/11/2016    Years since quitting: 3.9  . Smokeless tobacco: Never Used  Vaping Use  . Vaping Use: Never used  Substance and Sexual Activity  . Alcohol use: No  . Drug use: No  . Sexual activity: Not on file  Other Topics Concern  . Not on file  Social History Narrative   Lives with longtime boyfriend in [place], [place].   Had a stepson but he committed suicide 2012/13.   Disabled due to anxiety.   Longtime smoker, quits frequently but restarts.   No alcohol or illegal drugs.   No exercise.   Social Determinants of Health   Financial Resource Strain: Not on file  Food Insecurity: Not on file  Transportation Needs: Not on file  Physical Activity: Not on file  Stress: Not on file  Social Connections: Not on file    Family History  Problem Relation Age of Onset  . Heart disease Mother   . Cancer Mother   . Hypertension Mother   . Hyperlipidemia Mother   . Stroke Mother   . Heart disease Father   . Hypertension Father   . Hyperlipidemia Father   . Cancer Sister   . Heart disease Sister   . Alzheimer's disease Brother   . Kidney disease Brother     Outpatient Encounter Medications as of 03/29/2020  Medication Sig  . Blood Glucose Monitoring Suppl (ACCU-CHEK GUIDE ME) w/Device KIT 1 Piece by Does not apply route as directed.  Marland Kitchen glucose blood (ACCU-CHEK GUIDE) test strip Use as instructed  . metFORMIN (GLUCOPHAGE XR) 500 MG 24 hr tablet Take 1 tablet (500 mg total) by mouth daily with breakfast.  . amphetamine-dextroamphetamine (ADDERALL) 10 MG tablet TAKE (1) TABLET BY MOUTH TWICE DAILY.  Marland Kitchen atorvastatin (LIPITOR)  20 MG tablet TAKE (1) TABLET BY MOUTH ONCE DAILY.  . BD PEN NEEDLE NANO U/F 32G X 4 MM MISC USE AS DIRECTED UP TO 5 TIMES DAILY.  . calcium elemental as carbonate (TUMS ULTRA 1000) 400 MG chewable tablet Chew 1,000 mg by mouth daily.  . Cholecalciferol 2000 UNITS CAPS Take 1  capsule (2,000 Units total) by mouth daily. (Patient taking differently: Take 1 capsule by mouth daily. VIT D)  . clonazePAM (KLONOPIN) 2 MG tablet TAKE (1) TABLET BY MOUTH (3) TIMES DAILY.  . cyanocobalamin (,VITAMIN B-12,) 1000 MCG/ML injection INJECT 1ML INTO THE MUSCLE EVERY 14 DAYS.  . DULoxetine (CYMBALTA) 30 MG capsule 1 cap po qd  . FERROUS SULFATE PO Take by mouth.  . gabapentin (NEURONTIN) 300 MG capsule TAKE 4 CAPSULES BY MOUTH AT BEDTIME.  Marland Kitchen HYDROcodone-acetaminophen (NORCO) 10-325 MG tablet Take 1-1.5 tablets by mouth 3 (three) times daily as needed for moderate pain or severe pain.  Marland Kitchen insulin aspart (NOVOLOG FLEXPEN) 100 UNIT/ML FlexPen Inject 10-16 Units into the skin 3 (three) times daily before meals.  . insulin detemir (LEVEMIR FLEXTOUCH) 100 UNIT/ML FlexPen Inject 60 Units into the skin at bedtime.  . lamoTRIgine (LAMICTAL) 100 MG tablet 1 tab po qd  . mirtazapine (REMERON) 30 MG tablet TAKE (1) TABLET BY MOUTH AT BEDTIME.  Marland Kitchen ondansetron (ZOFRAN) 4 MG tablet TAKE 1 TABLET BY MOUTH EVERY 6 HOURS AS NEEDED FOR NAUSEA.  Marland Kitchen tiZANidine (ZANAFLEX) 4 MG tablet TAKE (1) TABLET BY MOUTH TWICE DAILY. (Patient taking differently: 2 (two) times daily as needed.)  . [DISCONTINUED] B-D ULTRAFINE III SHORT PEN 31G X 8 MM MISC USE AS DIRECTED UP TO 5 TIMES DAILY.  . [DISCONTINUED] insulin aspart (NOVOLOG FLEXPEN) 100 UNIT/ML FlexPen INJECT 18 UNITS UNDER THE SKIN BEFORE MEALS.  . [DISCONTINUED] Insulin Pen Needle (B-D ULTRAFINE III SHORT PEN) 31G X 8 MM MISC USE AS DIRECTED UP TO 5 TIMES DAILY.  . [DISCONTINUED] LEVEMIR FLEXTOUCH 100 UNIT/ML FlexPen INJECT 44 UNITS INTO THE SKIN IN THE MORNING AND 55 UNITS IN THE EVENING.    No facility-administered encounter medications on file as of 03/29/2020.    ALLERGIES: Allergies  Allergen Reactions  . Aspirin Other (See Comments)    Gastric Bypass  . Latex Other (See Comments)    blisters  . Penicillins     UNSPECIFIED REACTION >"Took large quantities as a child & told to never take again Has patient had a PCN reaction causing immediate rash, facial/tongue/throat swelling, SOB or lightheadedness with hypotension: No Has patient had a PCN reaction causing severe rash involving mucus membranes or skin necrosis: No Has patient had a PCN reaction that required hospitalization: No Has patient had a PCN reaction occurring within the last 10 years: No If all of the above answers are "NO", then may proceed wi  . Nickel   . Codeine Itching    VACCINATION STATUS: Immunization History  Administered Date(s) Administered  . Influenza Split 11/02/2010  . Influenza Whole 02/11/2010  . Influenza,inj,Quad PF,6+ Mos 11/19/2012, 10/20/2014, 11/10/2015, 01/01/2017  . Influenza,inj,quad, With Preservative 01/16/2018  . Janssen (J&J) SARS-COV-2 Vaccination 05/05/2019, 12/29/2019  . Pneumococcal Polysaccharide-23 11/10/2015  . Td 02/12/2003  . Tdap 06/16/2013  . Zoster Recombinat (Shingrix) 06/15/2019, 09/11/2019    Diabetes She presents for her initial diabetic visit. She has type 2 diabetes mellitus. Onset time: she was diagnosed at approx age of 36 years. Her disease course has been worsening. There are no hypoglycemic associated symptoms. Pertinent negatives for hypoglycemia include no confusion, headaches, pallor or seizures. Associated symptoms include blurred vision, fatigue, foot paresthesias, foot ulcerations, polydipsia and polyuria. Pertinent negatives for diabetes include no chest pain and no polyphagia. There are no hypoglycemic complications. Symptoms are worsening. Risk factors for coronary artery disease include dyslipidemia, diabetes mellitus, hypertension,  obesity, family history, post-menopausal, sedentary lifestyle  and tobacco exposure. Current diabetic treatments: she is on Levemir 44 units QAM, 60 units qhs, Novolog 18 units TID. Her weight is fluctuating minimally. She is following a generally unhealthy diet. When asked about meal planning, she reported none. Her home blood glucose trend is fluctuating minimally. (She admits that she does not monitor BG , may be a few times a week when she feels like it is low. Her POC a1c is 9.3%.) An ACE inhibitor/angiotensin II receptor blocker is not being taken. She does not see a podiatrist.Eye exam is not current.  Hyperlipidemia This is a chronic problem. The current episode started more than 1 year ago. The problem is uncontrolled. Exacerbating diseases include diabetes and obesity. Pertinent negatives include no chest pain, myalgias or shortness of breath. Current antihyperlipidemic treatment includes statins. Risk factors for coronary artery disease include dyslipidemia, diabetes mellitus, obesity, family history, hypertension, post-menopausal and a sedentary lifestyle.  Hypertension This is a chronic problem. The current episode started more than 1 year ago. The problem is controlled. Associated symptoms include blurred vision. Pertinent negatives include no chest pain, headaches, palpitations or shortness of breath. Risk factors for coronary artery disease include dyslipidemia, diabetes mellitus, family history, obesity, post-menopausal state, smoking/tobacco exposure and sedentary lifestyle.     Review of Systems  Constitutional: Positive for fatigue. Negative for chills, fever and unexpected weight change.  HENT: Negative for trouble swallowing and voice change.   Eyes: Positive for blurred vision. Negative for visual disturbance.  Respiratory: Negative for cough, shortness of breath and wheezing.   Cardiovascular: Negative for chest pain, palpitations and leg swelling.  Gastrointestinal: Negative for  diarrhea, nausea and vomiting.  Endocrine: Positive for polydipsia and polyuria. Negative for cold intolerance, heat intolerance and polyphagia.  Musculoskeletal: Negative for arthralgias and myalgias.  Skin: Negative for color change, pallor, rash and wound.  Neurological: Negative for seizures and headaches.  Psychiatric/Behavioral: Negative for confusion and suicidal ideas.    Objective:    Vitals with BMI 03/29/2020 03/29/2020 06/15/2019  Height 5' 2"  5' 2"  5' 2"   Weight 191 lbs 6 oz 192 lbs 10 oz 193 lbs 13 oz  BMI 35 61.95 09.32  Systolic - 671 245  Diastolic - 70 80  Pulse - 96 108    BP 116/70   Pulse 96   Ht 5' 2"  (1.575 m)   Wt 192 lb 9.6 oz (87.4 kg)   BMI 35.23 kg/m   Wt Readings from Last 3 Encounters:  03/29/20 191 lb 6.4 oz (86.8 kg)  03/29/20 192 lb 9.6 oz (87.4 kg)  06/15/19 193 lb 12.8 oz (87.9 kg)     Physical Exam Constitutional:      Appearance: She is well-developed.  HENT:     Head: Normocephalic and atraumatic.  Eyes:     Extraocular Movements: EOM normal.  Neck:     Thyroid: No thyromegaly.     Trachea: No tracheal deviation.  Cardiovascular:     Rate and Rhythm: Normal rate.  Pulmonary:     Effort: Pulmonary effort is normal.  Abdominal:     General: Bowel sounds are normal.     Palpations: Abdomen is soft.     Tenderness: There is no abdominal tenderness. There is no guarding.  Musculoskeletal:        General: No edema. Normal range of motion.     Cervical back: Normal range of motion and neck supple.  Skin:    General: Skin is warm and dry.  Coloration: Skin is not pale.     Findings: No erythema or rash.  Neurological:     Mental Status: She is alert and oriented to person, place, and time.     Cranial Nerves: No cranial nerve deficit.     Coordination: Coordination normal.     Deep Tendon Reflexes: Reflexes are normal and symmetric.     Comments: Diminished monofilament test on BLE.  Psychiatric:        Mood and Affect: Mood  and affect normal.        Judgment: Judgment normal.     CMP ( most recent) CMP     Component Value Date/Time   NA 134 (L) 06/15/2019 1159   K 5.2 No hemolysis seen (H) 06/15/2019 1159   CL 99 06/15/2019 1159   CO2 31 06/15/2019 1159   GLUCOSE 297 (H) 06/15/2019 1159   BUN 15 06/15/2019 1159   CREATININE 0.88 06/15/2019 1159   CREATININE 0.96 12/18/2018 1316   CALCIUM 9.6 06/15/2019 1159   PROT 7.5 06/15/2019 1159   ALBUMIN 4.2 06/15/2019 1159   AST 35 06/15/2019 1159   ALT 32 06/15/2019 1159   ALKPHOS 105 06/15/2019 1159   BILITOT 0.4 06/15/2019 1159   GFRNONAA >60 05/22/2017 0506   GFRAA >60 05/22/2017 0506     Diabetic Labs (most recent): Lab Results  Component Value Date   HGBA1C 9.3 (A) 03/29/2020   HGBA1C 9.2 (H) 06/15/2019   HGBA1C 8.3 (H) 12/03/2018     Lipid Panel ( most recent) Lipid Panel     Component Value Date/Time   CHOL 148 06/15/2019 1159   TRIG 89.0 06/15/2019 1159   HDL 40.50 06/15/2019 1159   CHOLHDL 4 06/15/2019 1159   VLDL 17.8 06/15/2019 1159   LDLCALC 90 06/15/2019 1159   LDLDIRECT 174.0 08/08/2015 1229      Lab Results  Component Value Date   TSH 2.43 04/01/2018   TSH 1.85 10/11/2016   TSH 4.83 (H) 11/10/2015   TSH 1.65 07/13/2014   TSH 2.46 06/16/2013   TSH 0.82 12/27/2011   TSH 1.954 07/04/2011   TSH 3.060 08/27/2010   TSH 3.409 01/31/2010   FREET4 0.96 10/11/2016   FREET4 0.89 11/14/2015       Assessment & Plan:   1. Type 2 diabetes mellitus without complication, without long-term current use of insulin ([place])   - Luis Dianna Sok has currently uncontrolled symptomatic type 2 DM since 65 years of age,  with most recent A1c of  9.3%.  Recent labs reviewed. - I had a long discussion with her about the progressive nature of diabetes and the pathology behind its complications. -her diabetes is complicated by obesity/ sedentary life and she remains at a high risk for more acute and chronic complications which  include CAD, CVA, CKD, retinopathy, and neuropathy. These are all discussed in detail with her.  - I have counseled her on diet  and weight management  by adopting a carbohydrate restricted/protein rich diet. Patient is encouraged to switch to  unprocessed or minimally processed     complex starch and increased protein intake (animal or plant source), fruits, and vegetables. -  she is advised to stick to a routine mealtimes to eat 3 meals  a day and avoid unnecessary snacks ( to snack only to correct hypoglycemia).   - she acknowledges that there is a room for improvement in her food and drink choices. - Suggestion is made for her to avoid simple carbohydrates  from her diet including Cakes, Sweet Desserts, Ice Cream, Soda (diet and regular), Sweet Tea, Candies, Chips, Cookies, Store Bought Juices, Alcohol in Excess of  1-2 drinks a day, Artificial Sweeteners,  Coffee Creamer, and "Sugar-free" Products. This will help patient to have more stable blood glucose profile and potentially avoid unintended weight gain.  - she will be scheduled with Jearld Fenton, RDN, CDE for diabetes education.  - I have approached her with the following individualized plan to manage  her diabetes and patient agrees:   - she will continue to need intensive treatment with basal/bolus insulin to achieve control of diabetes, but will benefit from de escalation in the doses of insulin. -she is advised to lower her Levemir to 60 units daily at bedtime , and prandial insulin Novolog lower to 10 units 3 times a day with meals  for pre-meal BG readings of 90-144m/dl, plus patient specific correction dose for unexpected hyperglycemia above 1547mdl, associated with strict monitoring of glucose 4 times a day-before meals and at bedtime. - she is warned not to take insulin without proper monitoring per orders. - Adjustment parameters are given to her for hypo and hyperglycemia in writing. - she is encouraged to call clinic for  blood glucose levels less than 70 or above 300 mg /dl. - she is willing to retry metformin, I discussed and started her on MTF 50052mR once daily after breakfast. She will be considered for incretin therapy as appropriate on subsequent visits. She will benefit from a CGM, will be considered for freestyle Libre next visit. - Specific targets for  A1c;  LDL, HDL,  and Triglycerides were discussed with the patient.  2) Blood Pressure /Hypertension:  her blood pressure is  controlled to target.   she is not on anti hypertensive meds.  3) Lipids/Hyperlipidemia:   Review of her recent lipid panel showed controlled  LDL at 91 .  she  is advised to continue Atorvastatin 20 mg daily at bedtime.  Side effects and precautions discussed with her.  4)  Weight/Diet:  Body mass index is 35.23 kg/m.  -   clearly complicating her diabetes care.   she is  a candidate for weight loss. I discussed with her the fact that loss of 5 - 10% of her  current body weight will have the most impact on her diabetes management.  Exercise, and detailed carbohydrates information provided  -  detailed on discharge instructions.  5) Chronic Care/Health Maintenance:  -she  is on  Statin medications and  is encouraged to initiate and continue to follow up with Ophthalmology, Dentist,  Podiatrist at least yearly or according to recommendations, and advised to   stay away from smoking. I have recommended yearly flu vaccine and pneumonia vaccine at least every 5 years; moderate intensity exercise for up to 150 minutes weekly; and  sleep for at least 7 hours a day.  - she is  advised to maintain close follow up with [facility] for primary care needs, as well as her other providers for optimal and coordinated care.   - Time spent in this patient care: 60 min, of which > 50% was spent in  counseling  her about her uncontrolled T2D, HPL, HTN, Obesity  and the rest reviewing her blood glucose logs , discussing her hypoglycemia  and hyperglycemia episodes, reviewing her current and  previous labs / studies  ( including abstraction from other facilities) and medications  doses and developing a  long term treatment  plan based on the latest standards of care/ guidelines; and documenting her care.    Please refer to Patient Instructions for Blood Glucose Monitoring and Insulin/Medications Dosing Guide"  in media tab for additional information. Please  also refer to " Patient Self Inventory" in the Media  tab for reviewed elements of pertinent patient history.  [name] participated in the discussions, expressed understanding, and voiced agreement with the above plans.  All questions were answered to her satisfaction. she is encouraged to contact clinic should she have any questions or concerns prior to her return visit.   Follow up plan: - Return in about 1 week (around 04/05/2020) for F/U with Meter and Logs Only - no Labs.  Glade Lloyd, MD [facility] [address] [place], [place] [address] Phone: [phone]  Fax: [phone]    03/29/2020, 1:04 PM  This note was partially dictated with voice recognition software. Similar sounding words can be transcribed inadequately or may not  be corrected upon review.

## 2020-03-29 NOTE — Addendum Note (Signed)
Addended by: Deveron Furlong D on: 03/29/2020 02:15 PM   Modules accepted: Orders

## 2020-03-29 NOTE — Patient Instructions (Signed)
                                     Advice for Weight Management  -For most of us the best way to lose weight is by diet management. Generally speaking, diet management means consuming less calories intentionally which over time brings about progressive weight loss.  This can be achieved more effectively by restricting carbohydrate consumption to the minimum possible.  So, it is critically important to know your numbers: how much calorie you are consuming and how much calorie you need. More importantly, our carbohydrates sources should be unprocessed or minimally processed complex starch food items.   Sometimes, it is important to balance nutrition by increasing protein intake (animal or plant source), fruits, and vegetables.  -Sticking to a routine mealtime to eat 3 meals a day and avoiding unnecessary snacks is shown to have a big role in weight control. Under normal circumstances, the only time we lose real weight is when we are hungry, so allow hunger to take place- hunger means no food between meal times, only water.  It is not advisable to starve.   -It is better to avoid simple carbohydrates including: Cakes, Sweet Desserts, Ice Cream, Soda (diet and regular), Sweet Tea, Candies, Chips, Cookies, Store Bought Juices, Alcohol in Excess of  1-2 drinks a day, Lemonade,  Artificial Sweeteners, Doughnuts, Coffee Creamers, "Sugar-free" Products, etc, etc.  This is not a complete list.....    -Consulting with certified diabetes educators is proven to provide you with the most accurate and current information on diet.  Also, you may be  interested in discussing diet options/exchanges , we can schedule a visit with Diana [name], RDN, CDE for individualized nutrition education.  -Exercise: If you are able: 30 -60 minutes a day ,4 days a week, or 150 minutes a week.  The longer the better.  Combine stretch, strength, and aerobic activities.  If you were told in the  past that you have high risk for cardiovascular diseases, you may seek evaluation by your heart doctor prior to initiating moderate to intense exercise programs.                                  Additional Care Considerations for Diabetes   -Diabetes  is a chronic disease.  The most important care consideration is regular follow-up with your diabetes care provider with the goal being avoiding or delaying its complications and to take advantage of advances in medications and technology.    -Type 2 diabetes is known to coexist with other important comorbidities such as high blood pressure and high cholesterol.  It is critical to control not only the diabetes but also the high blood pressure and high cholesterol to minimize and delay the risk of complications including coronary artery disease, stroke, amputations, blindness, etc.    - Studies showed that people with diabetes will benefit from a class of medications known as ACE inhibitors and statins.  Unless there are specific reasons not to be on these medications, the standard of care is to consider getting one from these groups of medications at an optimal doses.  These medications are generally considered safe and proven to help protect the heart and the kidneys.    - People with diabetes are encouraged to initiate and maintain regular follow-up with eye doctors, foot   doctors, dentists , and if necessary heart and kidney doctors.     - It is highly recommended that people with diabetes quit smoking or stay away from smoking, and get yearly  flu vaccine and pneumonia vaccine at least every 5 years.  One other important lifestyle recommendation is to ensure adequate sleep - at least 6-7 hours of uninterrupted sleep at night.  -Exercise: If you are able: 30 -60 minutes a day, 4 days a week, or 150 minutes a week.  The longer the better.  Combine stretch, strength, and aerobic activities.  If you were told in the past that you have high risk for  cardiovascular diseases, you may seek evaluation by your heart doctor prior to initiating moderate to intense exercise programs.          

## 2020-03-29 NOTE — Patient Instructions (Signed)
Diana [name] , Thank you for taking time to come for your Medicare Wellness Visit. I appreciate your ongoing commitment to your health goals. Please review the following plan we discussed and let me know if I can assist you in the future.   Screening recommendations/referrals: Colonoscopy: Per our conversation, unsure of last date & which test was actually done.. Discuss with PCP Mammogram: Completed 06/14/2019-Due 06/13/2020 Bone Density: Not yet indicated. Due at age 76 Recommended yearly ophthalmology/optometry visit for glaucoma screening and checkup Recommended yearly dental visit for hygiene and checkup  Vaccinations: Influenza vaccine: Declined today. Pneumococcal vaccine: Up to date Tdap vaccine: Up to date-Due 06/17/2023 Shingles vaccine: Completed vaccines Covid-19: Completed vaccines  Advanced directives: Information given today  Conditions/risks identified: See problem list  Next appointment: Follow up in one year for your annual wellness visit.   Preventive Care 40-64 Years, Female Preventive care refers to lifestyle choices and visits with your health care provider that can promote health and wellness. What does preventive care include?  A yearly physical exam. This is also called an annual well check.  Dental exams once or twice a year.  Routine eye exams. Ask your health care provider how often you should have your eyes checked.  Personal lifestyle choices, including:  Daily care of your teeth and gums.  Regular physical activity.  Eating a healthy diet.  Avoiding tobacco and drug use.  Limiting alcohol use.  Practicing safe sex.  Taking low-dose aspirin daily starting at age 40.  Taking vitamin and mineral supplements as recommended by your health care provider. What happens during an annual well check? The services and screenings done by your health care provider during your annual well check will depend on your age, overall health, lifestyle risk factors,  and family history of disease. Counseling  Your health care provider may ask you questions about your:  Alcohol use.  Tobacco use.  Drug use.  Emotional well-being.  Home and relationship well-being.  Sexual activity.  Eating habits.  Work and work Statistician.  Method of birth control.  Menstrual cycle.  Pregnancy history. Screening  You may have the following tests or measurements:  Height, weight, and BMI.  Blood pressure.  Lipid and cholesterol levels. These may be checked every 5 years, or more frequently if you are over 70 years old.  Skin check.  Lung cancer screening. You may have this screening every year starting at age 52 if you have a 30-pack-year history of smoking and currently smoke or have quit within the past 15 years.  Fecal occult blood test (FOBT) of the stool. You may have this test every year starting at age 50.  Flexible sigmoidoscopy or colonoscopy. You may have a sigmoidoscopy every 5 years or a colonoscopy every 10 years starting at age 73.  Hepatitis C blood test.  Hepatitis B blood test.  Sexually transmitted disease (STD) testing.  Diabetes screening. This is done by checking your blood sugar (glucose) after you have not eaten for a while (fasting). You may have this done every 1-3 years.  Mammogram. This may be done every 1-2 years. Talk to your health care provider about when you should start having regular mammograms. This may depend on whether you have a family history of breast cancer.  BRCA-related cancer screening. This may be done if you have a family history of breast, ovarian, tubal, or peritoneal cancers.  Pelvic exam and Pap test. This may be done every 3 years starting at age 79. Starting at  age 43, this may be done every 5 years if you have a Pap test in combination with an HPV test.  Bone density scan. This is done to screen for osteoporosis. You may have this scan if you are at high risk for osteoporosis. Discuss  your test results, treatment options, and if necessary, the need for more tests with your health care provider. Vaccines  Your health care provider may recommend certain vaccines, such as:  Influenza vaccine. This is recommended every year.  Tetanus, diphtheria, and acellular pertussis (Tdap, Td) vaccine. You may need a Td booster every 10 years.  Zoster vaccine. You may need this after age 66.  Pneumococcal 13-valent conjugate (PCV13) vaccine. You may need this if you have certain conditions and were not previously vaccinated.  Pneumococcal polysaccharide (PPSV23) vaccine. You may need one or two doses if you smoke cigarettes or if you have certain conditions. Talk to your health care provider about which screenings and vaccines you need and how often you need them. This information is not intended to replace advice given to you by your health care provider. Make sure you discuss any questions you have with your health care provider. Document Released: 02/24/2015 Document Revised: 10/18/2015 Document Reviewed: 11/29/2014 Elsevier Interactive Patient Education  2017 [place] Prevention in the Home Falls can cause injuries. They can happen to people of all ages. There are many things you can do to make your home safe and to help prevent falls. What can I do on the outside of my home?  Regularly fix the edges of walkways and driveways and fix any cracks.  Remove anything that might make you trip as you walk through a door, such as a raised step or threshold.  Trim any bushes or trees on the path to your home.  Use bright outdoor lighting.  Clear any walking paths of anything that might make someone trip, such as rocks or tools.  Regularly check to see if handrails are loose or broken. Make sure that both sides of any steps have handrails.  Any raised decks and porches should have guardrails on the edges.  Have any leaves, snow, or ice cleared regularly.  Use sand  or salt on walking paths during winter.  Clean up any spills in your garage right away. This includes oil or grease spills. What can I do in the bathroom?  Use night lights.  Install grab bars by the toilet and in the tub and shower. Do not use towel bars as grab bars.  Use non-skid mats or decals in the tub or shower.  If you need to sit down in the shower, use a plastic, non-slip stool.  Keep the floor dry. Clean up any water that spills on the floor as soon as it happens.  Remove soap buildup in the tub or shower regularly.  Attach bath mats securely with double-sided non-slip rug tape.  Do not have throw rugs and other things on the floor that can make you trip. What can I do in the bedroom?  Use night lights.  Make sure that you have a light by your bed that is easy to reach.  Do not use any sheets or blankets that are too big for your bed. They should not hang down onto the floor.  Have a firm chair that has side arms. You can use this for support while you get dressed.  Do not have throw rugs and other things on the floor that  can make you trip. What can I do in the kitchen?  Clean up any spills right away.  Avoid walking on wet floors.  Keep items that you use a lot in easy-to-reach places.  If you need to reach something above you, use a strong step stool that has a grab bar.  Keep electrical cords out of the way.  Do not use floor polish or wax that makes floors slippery. If you must use wax, use non-skid floor wax.  Do not have throw rugs and other things on the floor that can make you trip. What can I do with my stairs?  Do not leave any items on the stairs.  Make sure that there are handrails on both sides of the stairs and use them. Fix handrails that are broken or loose. Make sure that handrails are as long as the stairways.  Check any carpeting to make sure that it is firmly attached to the stairs. Fix any carpet that is loose or worn.  Avoid  having throw rugs at the top or bottom of the stairs. If you do have throw rugs, attach them to the floor with carpet tape.  Make sure that you have a light switch at the top of the stairs and the bottom of the stairs. If you do not have them, ask someone to add them for you. What else can I do to help prevent falls?  Wear shoes that:  Do not have high heels.  Have rubber bottoms.  Are comfortable and fit you well.  Are closed at the toe. Do not wear sandals.  If you use a stepladder:  Make sure that it is fully opened. Do not climb a closed stepladder.  Make sure that both sides of the stepladder are locked into place.  Ask someone to hold it for you, if possible.  Clearly mark and make sure that you can see:  Any grab bars or handrails.  First and last steps.  Where the edge of each step is.  Use tools that help you move around (mobility aids) if they are needed. These include:  Canes.  Walkers.  Scooters.  Crutches.  Turn on the lights when you go into a dark area. Replace any light bulbs as soon as they burn out.  Set up your furniture so you have a clear path. Avoid moving your furniture around.  If any of your floors are uneven, fix them.  If there are any pets around you, be aware of where they are.  Review your medicines with your doctor. Some medicines can make you feel dizzy. This can increase your chance of falling. Ask your doctor what other things that you can do to help prevent falls. This information is not intended to replace advice given to you by your health care provider. Make sure you discuss any questions you have with your health care provider. Document Released: 11/24/2008 Document Revised: 07/06/2015 Document Reviewed: 03/04/2014 Elsevier Interactive Patient Education  2017 [employer].

## 2020-03-30 DIAGNOSIS — G894 Chronic pain syndrome: Secondary | ICD-10-CM | POA: Diagnosis not present

## 2020-03-31 LAB — DRUG MONITORING, PANEL 8 WITH CONFIRMATION, URINE
6 Acetylmorphine: NEGATIVE ng/mL (ref <10)
Alcohol Metabolites: NEGATIVE ng/mL
Alphahydroxyalprazolam: NEGATIVE ng/mL (ref <25)
Alphahydroxymidazolam: NEGATIVE ng/mL (ref <50)
Alphahydroxytriazolam: NEGATIVE ng/mL (ref <50)
Aminoclonazepam: 535 ng/mL — ABNORMAL HIGH (ref <25)
Amphetamine: 8469 ng/mL — ABNORMAL HIGH (ref <250)
Amphetamines: POSITIVE ng/mL — AB (ref <500)
Benzodiazepines: POSITIVE ng/mL — AB (ref <100)
Buprenorphine, Urine: NEGATIVE ng/mL (ref <5)
Cocaine Metabolite: NEGATIVE ng/mL (ref <150)
Codeine: NEGATIVE ng/mL (ref <50)
Creatinine: 69.7 mg/dL
Hydrocodone: 2397 ng/mL — ABNORMAL HIGH (ref <50)
Hydromorphone: 367 ng/mL — ABNORMAL HIGH (ref <50)
Hydroxyethylflurazepam: NEGATIVE ng/mL (ref <50)
Lorazepam: NEGATIVE ng/mL (ref <50)
MDMA: NEGATIVE ng/mL (ref <500)
Marijuana Metabolite: NEGATIVE ng/mL (ref <20)
Methamphetamine: NEGATIVE ng/mL (ref <250)
Morphine: NEGATIVE ng/mL (ref <50)
Nordiazepam: NEGATIVE ng/mL (ref <50)
Norhydrocodone: 2610 ng/mL — ABNORMAL HIGH (ref <50)
Opiates: POSITIVE ng/mL — AB (ref <100)
Oxazepam: NEGATIVE ng/mL (ref <50)
Oxidant: NEGATIVE ug/mL
Oxycodone: NEGATIVE ng/mL (ref <100)
Temazepam: NEGATIVE ng/mL (ref <50)
pH: 5.7 (ref 4.5–9.0)

## 2020-03-31 LAB — DM TEMPLATE

## 2020-03-31 LAB — AFP TUMOR MARKER: AFP-Tumor Marker: 2.2 ng/mL

## 2020-04-05 ENCOUNTER — Ambulatory Visit: Payer: Medicare HMO | Admitting: "Endocrinology

## 2020-04-05 ENCOUNTER — Other Ambulatory Visit: Payer: Self-pay | Admitting: Family Medicine

## 2020-04-05 ENCOUNTER — Other Ambulatory Visit: Payer: Self-pay

## 2020-04-05 ENCOUNTER — Encounter: Payer: Self-pay | Admitting: "Endocrinology

## 2020-04-05 VITALS — BP 142/80 | HR 104 | Ht 62.0 in | Wt 186.2 lb

## 2020-04-05 DIAGNOSIS — E119 Type 2 diabetes mellitus without complications: Secondary | ICD-10-CM | POA: Diagnosis not present

## 2020-04-05 DIAGNOSIS — Z794 Long term (current) use of insulin: Secondary | ICD-10-CM | POA: Diagnosis not present

## 2020-04-05 MED ORDER — FREESTYLE LIBRE 2 SENSOR MISC: 1.0000 | 3 refills | Status: DC

## 2020-04-05 MED ORDER — LEVEMIR FLEXTOUCH 100 UNIT/ML ~~LOC~~ SOPN: 70.0000 [IU] | PEN_INJECTOR | Freq: Every day | SUBCUTANEOUS | 1 refills | Status: DC

## 2020-04-05 MED ORDER — FREESTYLE LIBRE 2 READER DEVI: 0 refills | Status: DC

## 2020-04-05 NOTE — Progress Notes (Signed)
04/05/2020, 4:55 PM  Endocrinology follow-up note   Subjective:    Patient ID: Diana [name], female    DOB: Apr 14, 1955.  [name] is being seen in follow-up after she was seen in consultation for management of currently uncontrolled symptomatic diabetes requested by  Tammi Sou, MD.   Past Medical History:  Diagnosis Date  . Abnormal liver diagnostic imaging 05/20/2017   CT abd->cirrhosis with atypical lesions that MRI f/u determined to be focal regions of severe steatosis.  . Abnormal mammogram 12/2014   F/u diagnostic mammo and u/s NORMAL--resume annual screening mammography  . Arthritis   . Bipolar disorder ([place])    questionable  . Cervical radiculopathy    Post cerv fusion C4-5, C5-6--stable as of 06/2019 NS f/u.  Marland Kitchen Chronic fatigue   . Chronic nausea 2012/13   Normal upper GI  12/2011 (mild GERD noted +small hiatal hernia)+  . Chronic pain syndrome    Chronic C spine pain with radiculopathy.  Dr. Francesco Runner at [facility] was her pain mgmt MD and he discharged her from [facility] pain mgmt center b/c she failed a UDS (+marijuana) 04/27/15. Dr. Trenton Gammon referred her to pain mgmt 2021.  Marland Kitchen Chronic renal insufficiency, stage II (mild) 02/2015   GFR 70s  . Colon cancer screening 03/06/2011   iFob 11/2010 through her insurer was negative. iFOB 01/2015 NEG.  . Depression   . Diabetes mellitus    urine protein-Cr ratio nl 09/2010, D.R. Screen neg 10/2009  . Fibromyalgia   . Fracture of rib of left side 08/14/15   5th and 6th  . Hepatic steatosis 2005   ultrasound.  Also, 07/2017 MRI abd--> plus a few focal areas of more severe steatosis.  Marland Kitchen History of kidney stones   . 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 Hyperlipidemia, mixed 2017; 03/2018   Recommended statin 03/2018  . Insomnia   . Iron deficiency anemia 09/2009   malabsorbtion s/p bariatric surgery  . NAFLD (nonalcoholic fatty liver  disease)    NAFLD->CT 2019 showed cirrhotic changes and f/u MRI showed benign areas of signif increased/severe focal steatosis.  Spleen normal 2019.  . Nephrolithiasis 11/2012  . Neurosis, anxiety, panic type   . Obesity    s/p bariatric surgery  . Osteopenia 12/2010   Hip T score -2.0.  Spine T score -0.5 (plan to repeat in 2 yrs)  . Other B-complex deficiencies   . Panic attacks   . Restless legs syndrome   . Syncopal episodes    7/73/17 in the setting of dehydration and sedative medication  . Tobacco dependence   . Vitamin B12 deficiency   . Vitamin D deficiency 09/2009  . Wears dentures    uppers and lowers    Past Surgical History:  Procedure Laterality Date  . ABDOMINAL HYSTERECTOMY     fibroids, ovaries remain  . APPENDECTOMY    . BACK SURGERY    . BLADDER SURGERY     bladder tack  . CERVICAL SPINE SURGERY  09/2009; 2229;7989   2014-ant cerv decomp (Dr. Carloyn Manner). 2018 fusion (Dr. Annette Stable)  . CHOLECYSTECTOMY  04/2010   w/lysis of adhesions (open procedure)--path showed chronic cholecystitis  and cholesterol polyp.  . CT CERVICAL SPINE W CONTRAST  ([facility])  06/2019   No evidence of hardware complication or loosening of her instrumentation.  . CYSTOSCOPY/RETROGRADE/URETEROSCOPY/STONE EXTRACTION WITH BASKET Left 11/21/2012   Left UVJ stone.  Procedure: CYSTOSCOPY/RETROGRADE/URETEROSCOPY/STONE EXTRACTION WITH BASKET insertion double j stent;  Surgeon: Ailene Rud, MD;  Location: [facility];  Service: Urology;  Laterality: Left;  . EPIDURAL BLOCK INJECTION  03/2011   Cervical  . HERNIA REPAIR  2005   mesenteric hernia repair, with lysis of adhesions (presented with SBO)  . POSTERIOR CERVICAL FUSION/FORAMINOTOMY N/A 12/31/2016   Procedure: Posterior Cervical Fusion with lateral mass fixation - Cervical four-five, Cervical five-six;  Surgeon: Earnie Larsson, MD;  Location: [facility];  Service: Neurosurgery;  Laterality: N/A;  . ROUX-EN-Y GASTRIC BYPASS    . TONSILLECTOMY      Social  History   Socioeconomic History  . Marital status: Significant Other    Spouse name: Not on file  . Number of children: Not on file  . Years of education: Not on file  . Highest education level: Not on file  Occupational History  . Not on file  Tobacco Use  . Smoking status: Former Smoker    Packs/day: 1.00    Years: 1.00    Pack years: 1.00    Types: Cigarettes    Quit date: 04/11/2016    Years since quitting: 3.9  . Smokeless tobacco: Never Used  Vaping Use  . Vaping Use: Never used  Substance and Sexual Activity  . Alcohol use: No  . Drug use: No  . Sexual activity: Not on file  Other Topics Concern  . Not on file  Social History Narrative   Lives with longtime boyfriend in [place], [place].   Had a stepson but he committed suicide 2012/13.   Disabled due to anxiety.   Longtime smoker, quits frequently but restarts.   No alcohol or illegal drugs.   No exercise.   Social Determinants of Health   Financial Resource Strain: Low Risk   . Difficulty of Paying Living Expenses: Not hard at all  Food Insecurity: No Food Insecurity  . Worried About Charity fundraiser in the Last Year: Never true  . Ran Out of Food in the Last Year: Never true  Transportation Needs: No Transportation Needs  . Lack of Transportation (Medical): No  . Lack of Transportation (Non-Medical): No  Physical Activity: Inactive  . Days of Exercise per Week: 0 days  . Minutes of Exercise per Session: 0 min  Stress: Stress Concern Present  . Feeling of Stress : To some extent  Social Connections: Moderately Isolated  . Frequency of Communication with Friends and Family: More than three times a week  . Frequency of Social Gatherings with Friends and Family: Once a week  . Attends Religious Services: Never  . Active Member of Clubs or Organizations: No  . Attends Archivist Meetings: Never  . Marital Status: Living with partner    Family History  Problem Relation Age of Onset  . Heart  disease Mother   . Cancer Mother   . Hypertension Mother   . Hyperlipidemia Mother   . Stroke Mother   . Heart disease Father   . Hypertension Father   . Hyperlipidemia Father   . Cancer Sister   . Heart disease Sister   . Alzheimer's disease Brother   . Kidney disease Brother     Outpatient Encounter Medications as of 04/05/2020  Medication  Sig  . Continuous Blood Gluc Receiver (FREESTYLE LIBRE 2 READER) DEVI As directed  . Continuous Blood Gluc Sensor (FREESTYLE LIBRE 2 SENSOR) MISC 1 Piece by Does not apply route every 14 (fourteen) days.  Marland Kitchen amphetamine-dextroamphetamine (ADDERALL) 10 MG tablet TAKE (1) TABLET BY MOUTH TWICE DAILY.  Marland Kitchen atorvastatin (LIPITOR) 20 MG tablet TAKE (1) TABLET BY MOUTH ONCE DAILY.  . BD PEN NEEDLE NANO U/F 32G X 4 MM MISC USE AS DIRECTED UP TO 5 TIMES DAILY.  Marland Kitchen Blood Glucose Monitoring Suppl (ACCU-CHEK GUIDE ME) w/Device KIT 1 Piece by Does not apply route as directed.  . calcium elemental as carbonate (TUMS ULTRA 1000) 400 MG chewable tablet Chew 1,000 mg by mouth daily.  . Cholecalciferol 2000 UNITS CAPS Take 1 capsule (2,000 Units total) by mouth daily. (Patient taking differently: Take 1 capsule by mouth daily. VIT D)  . clonazePAM (KLONOPIN) 2 MG tablet TAKE (1) TABLET BY MOUTH (3) TIMES DAILY.  . cyanocobalamin (,VITAMIN B-12,) 1000 MCG/ML injection INJECT 1ML INTO THE MUSCLE EVERY 14 DAYS.  . DULoxetine (CYMBALTA) 30 MG capsule 1 cap po qd  . FERROUS SULFATE PO Take by mouth.  . gabapentin (NEURONTIN) 300 MG capsule TAKE 4 CAPSULES BY MOUTH AT BEDTIME.  Marland Kitchen glucose blood (ACCU-CHEK GUIDE) test strip Use as instructed  . HYDROcodone-acetaminophen (NORCO) 10-325 MG tablet Take 1-1.5 tablets by mouth 3 (three) times daily as needed for moderate pain or severe pain.  Marland Kitchen insulin aspart (NOVOLOG FLEXPEN) 100 UNIT/ML FlexPen Inject 10-16 Units into the skin 3 (three) times daily before meals.  . insulin detemir (LEVEMIR FLEXTOUCH) 100 UNIT/ML FlexPen Inject 70  Units into the skin at bedtime.  . lamoTRIgine (LAMICTAL) 100 MG tablet 1 tab po qd  . metFORMIN (GLUCOPHAGE XR) 500 MG 24 hr tablet Take 1 tablet (500 mg total) by mouth daily with breakfast.  . mirtazapine (REMERON) 30 MG tablet TAKE (1) TABLET BY MOUTH AT BEDTIME.  Marland Kitchen ondansetron (ZOFRAN) 4 MG tablet TAKE 1 TABLET BY MOUTH EVERY 6 HOURS AS NEEDED FOR NAUSEA.  Marland Kitchen tiZANidine (ZANAFLEX) 4 MG tablet TAKE (1) TABLET BY MOUTH TWICE DAILY. (Patient taking differently: 2 (two) times daily as needed.)  . [DISCONTINUED] insulin detemir (LEVEMIR FLEXTOUCH) 100 UNIT/ML FlexPen Inject 60 Units into the skin at bedtime.   No facility-administered encounter medications on file as of 04/05/2020.    ALLERGIES: Allergies  Allergen Reactions  . Aspirin Other (See Comments)    Gastric Bypass  . Latex Other (See Comments)    blisters  . Penicillins     UNSPECIFIED REACTION >"Took large quantities as a child & told to never take again Has patient had a PCN reaction causing immediate rash, facial/tongue/throat swelling, SOB or lightheadedness with hypotension: No Has patient had a PCN reaction causing severe rash involving mucus membranes or skin necrosis: No Has patient had a PCN reaction that required hospitalization: No Has patient had a PCN reaction occurring within the last 10 years: No If all of the above answers are "NO", then may proceed wi  . Nickel   . Codeine Itching    VACCINATION STATUS: Immunization History  Administered Date(s) Administered  . Influenza Split 11/02/2010  . Influenza Whole 02/11/2010  . Influenza,inj,Quad PF,6+ Mos 11/19/2012, 10/20/2014, 11/10/2015, 01/01/2017  . Influenza,inj,quad, With Preservative 01/16/2018  . Janssen (J&J) SARS-COV-2 Vaccination 05/05/2019, 12/29/2019  . Pneumococcal Polysaccharide-23 11/10/2015  . Td 02/12/2003  . Tdap 06/16/2013  . Zoster Recombinat (Shingrix) 06/15/2019, 09/11/2019    Diabetes She presents  for her follow-up diabetic visit.  She has type 2 diabetes mellitus. Onset time: she was diagnosed at approx age of 61 years. Her disease course has been improving. There are no hypoglycemic associated symptoms. Pertinent negatives for hypoglycemia include no confusion, headaches, pallor or seizures. Associated symptoms include blurred vision, fatigue, foot paresthesias, foot ulcerations, polydipsia and polyuria. Pertinent negatives for diabetes include no chest pain and no polyphagia. There are no hypoglycemic complications. Symptoms are improving. Risk factors for coronary artery disease include dyslipidemia, diabetes mellitus, hypertension, obesity, family history, post-menopausal, sedentary lifestyle and tobacco exposure. Current diabetic treatments: she is on Levemir 44 units QAM, 60 units qhs, Novolog 18 units TID. Her weight is decreasing steadily. She is following a generally unhealthy diet. When asked about meal planning, she reported none. Her home blood glucose trend is increasing steadily. Her breakfast blood glucose range is generally 180-200 mg/dl. Her lunch blood glucose range is generally 180-200 mg/dl. Her dinner blood glucose range is generally 180-200 mg/dl. Her bedtime blood glucose range is generally 180-200 mg/dl. Her overall blood glucose range is 180-200 mg/dl. (She presents with better engagement, slightly improved glycemic profile.  Her recent point-of-care A1c was 9.3%.   She did not document hypoglycemia.  She tolerated Metformin.) An ACE inhibitor/angiotensin II receptor blocker is not being taken. She does not see a podiatrist.Eye exam is not current.  Hyperlipidemia This is a chronic problem. The current episode started more than 1 year ago. The problem is uncontrolled. Exacerbating diseases include diabetes and obesity. Pertinent negatives include no chest pain, myalgias or shortness of breath. Current antihyperlipidemic treatment includes statins. Risk factors for coronary artery disease include dyslipidemia,  diabetes mellitus, obesity, family history, hypertension, post-menopausal and a sedentary lifestyle.  Hypertension This is a chronic problem. The current episode started more than 1 year ago. The problem is controlled. Associated symptoms include blurred vision. Pertinent negatives include no chest pain, headaches, palpitations or shortness of breath. Risk factors for coronary artery disease include dyslipidemia, diabetes mellitus, family history, obesity, post-menopausal state, smoking/tobacco exposure and sedentary lifestyle.    Review of Systems  Constitutional: Positive for fatigue. Negative for chills, fever and unexpected weight change.  HENT: Negative for trouble swallowing and voice change.   Eyes: Positive for blurred vision. Negative for visual disturbance.  Respiratory: Negative for cough, shortness of breath and wheezing.   Cardiovascular: Negative for chest pain, palpitations and leg swelling.  Gastrointestinal: Negative for diarrhea, nausea and vomiting.  Endocrine: Positive for polydipsia and polyuria. Negative for cold intolerance, heat intolerance and polyphagia.  Musculoskeletal: Negative for arthralgias and myalgias.  Skin: Negative for color change, pallor, rash and wound.  Neurological: Negative for seizures and headaches.  Psychiatric/Behavioral: Negative for confusion and suicidal ideas.    Objective:    Vitals with BMI 04/05/2020 03/29/2020 03/29/2020  Height _0  _1  _2   Weight 186 lbs 3 oz 191 lbs 6 oz 191 lbs 6 oz  BMI 89.16 35 35  Systolic 945 038 882  Diastolic 80 73 70  Pulse 800 105 110    BP (!) 142/80   Pulse (!) 104   Ht _3  (1.575 m)   Wt 186 lb 3.2 oz (84.5 kg)   BMI 34.06 kg/m   Wt Readings from Last 3 Encounters:  04/05/20 186 lb 3.2 oz (84.5 kg)  03/29/20 191 lb 6.4 oz (86.8 kg)  03/29/20 191 lb 6.4 oz (86.8 kg)     Physical Exam Constitutional:      Appearance:  She is well-developed.  HENT:     Head: Normocephalic and  atraumatic.  Neck:     Thyroid: No thyromegaly.     Trachea: No tracheal deviation.  Cardiovascular:     Rate and Rhythm: Normal rate.  Pulmonary:     Effort: Pulmonary effort is normal.  Abdominal:     General: Bowel sounds are normal.     Palpations: Abdomen is soft.     Tenderness: There is no abdominal tenderness. There is no guarding.  Musculoskeletal:        General: Normal range of motion.     Cervical back: Normal range of motion and neck supple.  Skin:    General: Skin is warm and dry.     Coloration: Skin is not pale.     Findings: No erythema or rash.  Neurological:     Mental Status: She is alert and oriented to person, place, and time.     Cranial Nerves: No cranial nerve deficit.     Coordination: Coordination normal.     Deep Tendon Reflexes: Reflexes are normal and symmetric.     Comments: Diminished monofilament test on BLE.  Psychiatric:        Judgment: Judgment normal.     CMP ( most recent) CMP     Component Value Date/Time   NA 139 03/29/2020 1410   K 4.3 03/29/2020 1410   CL 99 03/29/2020 1410   CO2 33 (H) 03/29/2020 1410   GLUCOSE 100 (H) 03/29/2020 1410   BUN 14 03/29/2020 1410   CREATININE 0.87 03/29/2020 1410   CREATININE 0.96 12/18/2018 1316   CALCIUM 9.7 03/29/2020 1410   PROT 7.8 03/29/2020 1410   ALBUMIN 4.3 03/29/2020 1410   AST 39 (H) 03/29/2020 1410   ALT 33 03/29/2020 1410   ALKPHOS 106 03/29/2020 1410   BILITOT 0.4 03/29/2020 1410   GFRNONAA >60 05/22/2017 0506   GFRAA >60 05/22/2017 0506     Diabetic Labs (most recent): Lab Results  Component Value Date   HGBA1C 9.3 (A) 03/29/2020   HGBA1C 9.2 (H) 06/15/2019   HGBA1C 8.3 (H) 12/03/2018     Lipid Panel ( most recent) Lipid Panel     Component Value Date/Time   CHOL 148 06/15/2019 1159   TRIG 89.0 06/15/2019 1159   HDL 40.50 06/15/2019 1159   CHOLHDL 4 06/15/2019 1159   VLDL 17.8 06/15/2019 1159   LDLCALC 90 06/15/2019 1159   LDLDIRECT 174.0 08/08/2015 1229       Lab Results  Component Value Date   TSH 3.22 03/29/2020   TSH 2.43 04/01/2018   TSH 1.85 10/11/2016   TSH 4.83 (H) 11/10/2015   TSH 1.65 07/13/2014   TSH 2.46 06/16/2013   TSH 0.82 12/27/2011   TSH 1.954 07/04/2011   TSH 3.060 08/27/2010   TSH 3.409 01/31/2010   FREET4 0.96 10/11/2016   FREET4 0.89 11/14/2015       Assessment & Plan:   1. Type 2 diabetes mellitus without complication, without long-term current use of insulin ([place])   - Romonia Mayari Matus has currently uncontrolled symptomatic type 2 DM since 65 years of age.  She presents with better engagement, slightly improved glycemic profile.  Her recent point-of-care A1c was 9.3%.   She did not document hypoglycemia.  She tolerated Metformin.  Recent labs reviewed. - I had a long discussion with her about the progressive nature of diabetes and the pathology behind its complications. -her diabetes is complicated by obesity/ sedentary life  and she remains at a high risk for more acute and chronic complications which include CAD, CVA, CKD, retinopathy, and neuropathy. These are all discussed in detail with her.  - I have counseled her on diet  and weight management  by adopting a carbohydrate restricted/protein rich diet. Patient is encouraged to switch to  unprocessed or minimally processed     complex starch and increased protein intake (animal or plant source), fruits, and vegetables. -  she is advised to stick to a routine mealtimes to eat 3 meals  a day and avoid unnecessary snacks ( to snack only to correct hypoglycemia).   - she acknowledges that there is a room for improvement in her food and drink choices. - Suggestion is made for her to avoid simple carbohydrates  from her diet including Cakes, Sweet Desserts, Ice Cream, Soda (diet and regular), Sweet Tea, Candies, Chips, Cookies, Store Bought Juices, Alcohol in Excess of  1-2 drinks a day, Artificial Sweeteners,  Coffee Creamer, and "Sugar-free" Products,  Lemonade. This will help patient to have more stable blood glucose profile and potentially avoid unintended weight gain.   - she will be scheduled with Jearld Fenton, RDN, CDE for diabetes education.  - I have approached her with the following individualized plan to manage  her diabetes and patient agrees:   - she will continue to need intensive treatment with basal/bolus insulin to achieve control of diabetes, but will benefit from de escalation in the doses of insulin. -she is advised to increase her Levemir to 70 units daily at bedtime, continue NovoLog 10 units 3 times a day with meals  for pre-meal BG readings of 90-135m/dl, plus patient specific correction dose for unexpected hyperglycemia above 1521mdl, associated with strict monitoring of glucose 4 times a day-before meals and at bedtime. - she is warned not to take insulin without proper monitoring per orders. - Adjustment parameters are given to her for hypo and hyperglycemia in writing. - she is encouraged to call clinic for blood glucose levels less than 70 or above 300 mg /dl. - she has tolerated the low-dose Metformin.  She is advised to continue MTF 50032mR once daily after breakfast. She will be considered for incretin therapy as appropriate on subsequent visits. She will benefit from a CGM, will be considered for freestyle Libre next visit. - Specific targets for  A1c;  LDL, HDL,  and Triglycerides were discussed with the patient.  2) Blood Pressure /Hypertension: Her blood pressure is controlled to near target.   she is not on anti hypertensive meds.  3) Lipids/Hyperlipidemia:   Review of her recent lipid panel showed controlled  LDL at 91 .  she  is advised to continue atorvastatin 20 mg p.o. daily at bedtime.   Side effects and precautions discussed with her.  4)  Weight/Diet:  Body mass index is 34.06 kg/m.  -   clearly complicating her diabetes care.   she is  a candidate for weight loss. I discussed with her the fact  that loss of 5 - 10% of her  current body weight will have the most impact on her diabetes management.  Exercise, and detailed carbohydrates information provided  -  detailed on discharge instructions.  5) Chronic Care/Health Maintenance:  -she  is on  Statin medications and  is encouraged to initiate and continue to follow up with Ophthalmology, Dentist,  Podiatrist at least yearly or according to recommendations, and advised to   stay away from smoking. I have recommended  yearly flu vaccine and pneumonia vaccine at least every 5 years; moderate intensity exercise for up to 150 minutes weekly; and  sleep for at least 7 hours a day.  POCT ABI Results 04/05/20  Her ABIs normal today April 05, 2020. Right ABI: 1.11      left ABI: 1.11  Right leg systolic / diastolic: 263/78 mmHg Left leg systolic / diastolic: 588/50 mmHg  Arm systolic / diastolic: 277/41 mmHG This study will be repeated in February 2027, or sooner if needed.  - she is  advised to maintain close follow up with McGowen, Adrian Blackwater, MD for primary care needs, as well as her other providers for optimal and coordinated care.   - Time spent on this patient care encounter:  40 min, of which > 50% was spent in  counseling and the rest reviewing her blood glucose logs , discussing her hypoglycemia and hyperglycemia episodes, reviewing her current and  previous labs / studies  ( including abstraction from other facilities) and medications  doses and developing a  long term treatment plan and documenting her care.   Please refer to Patient Instructions for Blood Glucose Monitoring and Insulin/Medications Dosing Guide"  in media tab for additional information. Please  also refer to " Patient Self Inventory" in the Media  tab for reviewed elements of pertinent patient history.  [name] participated in the discussions, expressed understanding, and voiced agreement with the above plans.  All questions were answered to her  satisfaction. she is encouraged to contact clinic should she have any questions or concerns prior to her return visit.    Follow up plan: - Return in about 3 months (around 07/03/2020) for Bring Meter and Logs- A1c in Office.  Glade Lloyd, MD [facility] [address] [place], [place] [address] Phone: [phone]  Fax: [phone]    04/05/2020, 4:55 PM  This note was partially dictated with voice recognition software. Similar sounding words can be transcribed inadequately or may not  be corrected upon review.

## 2020-04-05 NOTE — Progress Notes (Signed)
abi

## 2020-04-05 NOTE — Patient Instructions (Signed)

## 2020-04-06 ENCOUNTER — Other Ambulatory Visit: Payer: Self-pay | Admitting: Family Medicine

## 2020-04-06 DIAGNOSIS — Z1231 Encounter for screening mammogram for malignant neoplasm of breast: Secondary | ICD-10-CM

## 2020-04-10 ENCOUNTER — Telehealth: Payer: Self-pay

## 2020-04-10 NOTE — Telephone Encounter (Signed)
Diana [name] with [facility] left a Vm that she received a RX for her Diana [name] and it is missing the DX code, quantity and refills. Call back [phone]

## 2020-04-11 NOTE — Telephone Encounter (Signed)
Left a message requesting a return call to the office.

## 2020-04-12 NOTE — Telephone Encounter (Signed)
Left a message requesting a return call to the office.

## 2020-04-13 ENCOUNTER — Telehealth: Payer: Self-pay

## 2020-04-13 ENCOUNTER — Other Ambulatory Visit: Payer: Self-pay

## 2020-04-13 DIAGNOSIS — E119 Type 2 diabetes mellitus without complications: Secondary | ICD-10-CM

## 2020-04-13 MED ORDER — BD PEN NEEDLE NANO U/F 32G X 4 MM MISC: 5 refills | Status: DC

## 2020-04-13 MED ORDER — LEVEMIR FLEXTOUCH 100 UNIT/ML ~~LOC~~ SOPN: 70.0000 [IU] | PEN_INJECTOR | Freq: Every day | SUBCUTANEOUS | 1 refills | Status: DC

## 2020-04-13 NOTE — Telephone Encounter (Signed)
Sent in

## 2020-04-13 NOTE — Telephone Encounter (Signed)
Patient said that she needs her pen needles called in to [facility] she needs her Levemir called in-it says " fill later "-pharmacy's can not see that. Please advise

## 2020-04-21 ENCOUNTER — Other Ambulatory Visit: Payer: Self-pay | Admitting: Family Medicine

## 2020-04-25 NOTE — Telephone Encounter (Signed)
RF request for klonopin LOV:03/29/20 Next ov: 06/27/20 Last written:03/27/20 (90,0)

## 2020-05-15 ENCOUNTER — Ambulatory Visit: Payer: Medicare HMO | Admitting: Nutrition

## 2020-05-16 ENCOUNTER — Telehealth: Payer: Self-pay | Admitting: "Endocrinology

## 2020-05-16 NOTE — Telephone Encounter (Signed)
Pt is calling and states that her pharmacist told her to ask the office to right her Levemir as 100units a day even though she knows she only does 70 units a day so that she isnt having to pay 2 copays a month due to the way they are in the box.  Also is needing her [facility] sensor sent to local pharmacy and not [place], [place] Phone:  [phone]  Fax:  [phone]

## 2020-05-17 ENCOUNTER — Other Ambulatory Visit: Payer: Self-pay | Admitting: Family Medicine

## 2020-05-17 NOTE — Telephone Encounter (Signed)
Unfortunately, that becomes a fraud. We can only document close to what she actually takes.

## 2020-05-17 NOTE — Telephone Encounter (Signed)
Pt is calling back - please advise.

## 2020-05-17 NOTE — Telephone Encounter (Signed)
Discussed with pt, understanding voiced. While talking with pt she stated she had talked with [facility] and was told they are out of network for Orlovista 2. Pt asked if we would send Rx for [facility] 2 receiver and sensors to [place].

## 2020-05-18 ENCOUNTER — Other Ambulatory Visit: Payer: Self-pay | Admitting: "Endocrinology

## 2020-05-18 MED ORDER — FREESTYLE LIBRE 2 READER DEVI: 0 refills | Status: DC

## 2020-05-18 MED ORDER — FREESTYLE LIBRE 2 SENSOR MISC: 1.0000 | 3 refills | Status: DC

## 2020-05-18 NOTE — Telephone Encounter (Signed)
Noted  

## 2020-05-18 NOTE — Telephone Encounter (Signed)
Advised patient that insurance is not covering the [employer] or any of the CGM units, patient aware and verbalized understanding, She will continue to do finger sticks.

## 2020-05-18 NOTE — Telephone Encounter (Signed)
We can try.

## 2020-05-31 ENCOUNTER — Telehealth: Payer: Self-pay

## 2020-05-31 NOTE — Telephone Encounter (Signed)
Received fax from [employer] regarding cologuard test for colorectal screening. Spoke with patient regarding regarding completion of this. She stated she has received test and plans to complete within the next week.

## 2020-06-07 ENCOUNTER — Other Ambulatory Visit: Payer: Self-pay | Admitting: Family Medicine

## 2020-06-27 ENCOUNTER — Other Ambulatory Visit: Payer: Self-pay

## 2020-06-27 ENCOUNTER — Encounter: Payer: Self-pay | Admitting: Family Medicine

## 2020-06-27 ENCOUNTER — Ambulatory Visit (INDEPENDENT_AMBULATORY_CARE_PROVIDER_SITE_OTHER): Payer: Medicare HMO | Admitting: Family Medicine

## 2020-06-27 VITALS — BP 133/80 | HR 81 | Temp 97.8°F | Resp 16 | Ht 62.0 in | Wt 188.0 lb

## 2020-06-27 DIAGNOSIS — E7800 Pure hypercholesterolemia, unspecified: Secondary | ICD-10-CM

## 2020-06-27 DIAGNOSIS — F339 Major depressive disorder, recurrent, unspecified: Secondary | ICD-10-CM | POA: Diagnosis not present

## 2020-06-27 DIAGNOSIS — F411 Generalized anxiety disorder: Secondary | ICD-10-CM

## 2020-06-27 DIAGNOSIS — Z79899 Other long term (current) drug therapy: Secondary | ICD-10-CM | POA: Diagnosis not present

## 2020-06-27 DIAGNOSIS — F909 Attention-deficit hyperactivity disorder, unspecified type: Secondary | ICD-10-CM

## 2020-06-27 DIAGNOSIS — M19049 Primary osteoarthritis, unspecified hand: Secondary | ICD-10-CM | POA: Diagnosis not present

## 2020-06-27 DIAGNOSIS — K909 Intestinal malabsorption, unspecified: Secondary | ICD-10-CM | POA: Diagnosis not present

## 2020-06-27 DIAGNOSIS — F988 Other specified behavioral and emotional disorders with onset usually occurring in childhood and adolescence: Secondary | ICD-10-CM | POA: Diagnosis not present

## 2020-06-27 DIAGNOSIS — Z9884 Bariatric surgery status: Secondary | ICD-10-CM

## 2020-06-27 DIAGNOSIS — G894 Chronic pain syndrome: Secondary | ICD-10-CM | POA: Diagnosis not present

## 2020-06-27 DIAGNOSIS — M19042 Primary osteoarthritis, left hand: Secondary | ICD-10-CM | POA: Diagnosis not present

## 2020-06-27 DIAGNOSIS — K760 Fatty (change of) liver, not elsewhere classified: Secondary | ICD-10-CM | POA: Diagnosis not present

## 2020-06-27 LAB — COMPREHENSIVE METABOLIC PANEL
ALT: 30 U/L (ref 0–35)
AST: 36 U/L (ref 0–37)
Albumin: 4.3 g/dL (ref 3.5–5.2)
Alkaline Phosphatase: 91 U/L (ref 39–117)
BUN: 13 mg/dL (ref 6–23)
CO2: 31 mEq/L (ref 19–32)
Calcium: 9.3 mg/dL (ref 8.4–10.5)
Chloride: 98 mEq/L (ref 96–112)
Creatinine, Ser: 0.82 mg/dL (ref 0.40–1.20)
GFR: 76.1 mL/min (ref >60.00)
Glucose, Bld: 147 mg/dL — ABNORMAL HIGH (ref 70–99)
Potassium: 4.7 mEq/L (ref 3.5–5.1)
Sodium: 136 mEq/L (ref 135–145)
Total Bilirubin: 0.5 mg/dL (ref 0.2–1.2)
Total Protein: 7.2 g/dL (ref 6.0–8.3)

## 2020-06-27 LAB — LIPID PANEL
Cholesterol: 147 mg/dL (ref 0–200)
HDL: 36.1 mg/dL — ABNORMAL LOW (ref >39.00)
LDL Cholesterol: 76 mg/dL (ref 0–99)
NonHDL: 111.05
Total CHOL/HDL Ratio: 4
Triglycerides: 174 mg/dL — ABNORMAL HIGH (ref 0.0–149.0)
VLDL: 34.8 mg/dL (ref 0.0–40.0)

## 2020-06-27 LAB — VITAMIN B12: Vitamin B-12: 1264 pg/mL — ABNORMAL HIGH (ref 211–911)

## 2020-06-27 MED ORDER — DULOXETINE HCL 30 MG PO CPEP: ORAL_CAPSULE | ORAL | 3 refills | Status: DC

## 2020-06-27 MED ORDER — MIRTAZAPINE 30 MG PO TABS: ORAL_TABLET | ORAL | 3 refills | Status: DC

## 2020-06-27 MED ORDER — AMPHETAMINE-DEXTROAMPHETAMINE 10 MG PO TABS: ORAL_TABLET | ORAL | 0 refills | Status: DC

## 2020-06-27 MED ORDER — LAMOTRIGINE 100 MG PO TABS: ORAL_TABLET | ORAL | 3 refills | Status: DC

## 2020-06-27 NOTE — Progress Notes (Signed)
OFFICE VISIT  06/27/2020  CC:  Chief Complaint  Patient presents with  . Follow-up    GAD, adult ADD    HPI:    Patient is a 65 y.o. Caucasian female who presents for 3 mo f/u recurrent MDD, GAD/agoraphobia, adult ADD, and HLD. A/P as of last visit: "1) Recurrent MDD, GAD/agoraphobia, adult ADD: all optimally medically treated on current regimen, no changes made today. CSC UTD. UDS obtained today: should show benzo, ampheta, hydrocodone.  2) NAFLD: cirrhotic changes on imaging in the past. Hepatic panel today, AFP today, order repeat liver u/s to monitor.  3) Health maintenance exam: Reviewed age and gender appropriate health maintenance issues (prudent diet, regular exercise, health risks of tobacco and excessive alcohol, use of seatbelts, fire alarms in home, use of sunscreen).  Also reviewed age and gender appropriate health screening as well as vaccine recommendations. Vaccines: Flu->pt declined.  Otherwise ALL UTD. Labs: cbc, cmet, tsh, afp. Cervical ca screening: n/a->pt is s/p hysterectomy for benign dx. Breast ca screening: mammogram neg 06/2019, rpt around 06/2020. Colon ca screening: has never had colon ca screening->cologuard ordered. Osteoporosis screening: DEXA 12/2010 osteopenic.  Due for rpt->ordered today."  INTERIM HX: All labs last visit came back normal. She has not scheduled her abd u/s.  Her DEXA and mammogram are scheduled. She is doing okay.  Has been trying to improve her water intake.  Chronic low energy level, she blames some of this on the J&J covid vaccine.  Hx malabsorption syndrome: on vit B12 and iron. Most recent b12 inj was 8d ago.  PMP AWARE reviewed today: most recent rx for clonazepam 63m was filled 05/29/20, # [phone] rx by me. Most recent rx for adderall 159mwas filled 03/27/20, #180, rx by me. No red flags.    Past Medical History:  Diagnosis Date  . Abnormal liver diagnostic imaging 05/20/2017   CT abd->cirrhosis with atypical  lesions that MRI f/u determined to be focal regions of severe steatosis.  . Abnormal mammogram 12/2014   F/u diagnostic mammo and u/s NORMAL--resume annual screening mammography  . Arthritis   . Bipolar disorder ([facility]   questionable  . Cervical radiculopathy    Post cerv fusion C4-5, C5-6--stable as of 06/2019 NS f/u.  . Marland Kitchenhronic fatigue   . Chronic nausea 2012/13   Normal upper GI  12/2011 (mild GERD noted +small hiatal hernia)+  . Chronic pain syndrome    Chronic C spine pain with radiculopathy.  [facility]c she failed a UDS (+marijuana) 04/27/15. Dr. PoTrenton Gammoneferred her to pain mgmt 2021.  . Marland Kitchenhronic renal insufficiency, stage II (mild) 02/2015   GFR 70s  . Colon cancer screening 03/06/2011   iFob 11/2010 through her insurer was negative. iFOB 01/2015 NEG.  . Depression   . Diabetes mellitus    urine protein-Cr ratio nl 09/2010, D.R. Screen neg 10/2009  . Fibromyalgia   . Fracture of rib of left side 08/14/15   5th and 6th  . Hepatic steatosis 2005   ultrasound.  Also, 07/2017 MRI abd--> plus a few focal areas of more severe steatosis.  . Marland Kitchenistory of kidney stones   . 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 Hyperlipidemia, mixed 2017; 03/2018   Recommended statin 03/2018  . Insomnia   . Iron deficiency anemia 09/2009   malabsorbtion s/p bariatric surgery  . NAFLD (nonalcoholic fatty liver disease)    NAFLD->CT  2019 showed cirrhotic changes and f/u MRI showed benign areas of signif increased/severe focal steatosis.  Spleen normal 2019.  . Nephrolithiasis 11/2012  . Neurosis, anxiety, panic type   . Obesity    s/p bariatric surgery  . Osteopenia 12/2010   Hip T score -2.0.  Spine T score -0.5 (plan to repeat in 2 yrs)  . Other B-complex deficiencies   . Panic attacks   . Restless legs syndrome   . Syncopal episodes    7/73/17 in the setting of dehydration and sedative medication  . Tobacco  dependence   . Vitamin B12 deficiency   . Vitamin D deficiency 09/2009  . Wears dentures    uppers and lowers    Past Surgical History:  Procedure Laterality Date  . ABDOMINAL HYSTERECTOMY     fibroids, ovaries remain  . APPENDECTOMY    . BACK SURGERY    . BLADDER SURGERY     bladder tack  . CERVICAL SPINE SURGERY  09/2009; 7510;2585   2014-ant cerv decomp (Dr. Carloyn Manner). 2018 fusion (Dr. Annette Stable)  . CHOLECYSTECTOMY  04/2010   w/lysis of adhesions (open procedure)--path showed chronic cholecystitis and cholesterol polyp.  . CT CERVICAL SPINE W CONTRAST  ([place] [facility])  06/2019   No evidence of hardware complication or loosening of her instrumentation.  . CYSTOSCOPY/RETROGRADE/URETEROSCOPY/STONE EXTRACTION WITH BASKET Left 11/21/2012   Left UVJ stone.  Procedure: CYSTOSCOPY/RETROGRADE/URETEROSCOPY/STONE EXTRACTION WITH BASKET insertion double j stent;  Surgeon: Ailene Rud, MD;  Location: [facility];  Service: Urology;  Laterality: Left;  . EPIDURAL BLOCK INJECTION  03/2011   Cervical  . HERNIA REPAIR  2005   mesenteric hernia repair, with lysis of adhesions (presented with SBO)  . POSTERIOR CERVICAL FUSION/FORAMINOTOMY N/A 12/31/2016   Procedure: Posterior Cervical Fusion with lateral mass fixation - Cervical four-five, Cervical five-six;  Surgeon: Earnie Larsson, MD;  Location: [facility];  Service: Neurosurgery;  Laterality: N/A;  . ROUX-EN-Y GASTRIC BYPASS    . TONSILLECTOMY      Outpatient Medications Prior to Visit  Medication Sig Dispense Refill  . atorvastatin (LIPITOR) 20 MG tablet TAKE (1) TABLET BY MOUTH ONCE DAILY. 90 tablet 0  . B-D ULTRAFINE III SHORT PEN 31G X 8 MM MISC USE AS DIRECTED UP TO 5 TIMES DAILY. 100 each 0  . Blood Glucose Monitoring Suppl (ACCU-CHEK GUIDE ME) w/Device KIT 1 Piece by Does not apply route as directed. 1 kit 0  . calcium elemental as carbonate (TUMS ULTRA 1000) 400 MG chewable tablet Chew 1,000 mg by mouth daily.    . Cholecalciferol 2000 UNITS CAPS Take 1  capsule (2,000 Units total) by mouth daily. (Patient taking differently: Take 1 capsule by mouth daily. VIT D) 30 each 11  . clonazePAM (KLONOPIN) 2 MG tablet TAKE (1) TABLET BY MOUTH (3) TIMES DAILY. 90 tablet 5  . cyanocobalamin (,VITAMIN B-12,) 1000 MCG/ML injection INJECT 1ML INTO THE MUSCLE EVERY 14 DAYS. 10 mL 1  . FERROUS SULFATE PO Take by mouth.    . gabapentin (NEURONTIN) 300 MG capsule TAKE 4 CAPSULES BY MOUTH AT BEDTIME. 360 capsule 0  . glucose blood (ACCU-CHEK GUIDE) test strip Use as instructed 150 each 2  . HYDROcodone-acetaminophen (NORCO) 10-325 MG tablet Take 1-1.5 tablets by mouth 3 (three) times daily as needed for moderate pain or severe pain.    Marland Kitchen insulin detemir (LEVEMIR FLEXTOUCH) 100 UNIT/ML FlexPen Inject 70 Units into the skin at bedtime. 30 mL 1  . metFORMIN (GLUCOPHAGE XR) 500 MG 24 hr  tablet Take 1 tablet (500 mg total) by mouth daily with breakfast. 90 tablet 1  . NOVOLOG FLEXPEN 100 UNIT/ML FlexPen INJECT 18 UNITS UNDER THE SKIN BEFORE MEALS. (Patient taking differently: Sliding scale) 15 mL 0  . ondansetron (ZOFRAN) 4 MG tablet TAKE 1 TABLET BY MOUTH EVERY 6 HOURS AS NEEDED FOR NAUSEA. 20 tablet 0  . tiZANidine (ZANAFLEX) 4 MG tablet TAKE (1) TABLET BY MOUTH TWICE DAILY. 180 tablet 1  . amphetamine-dextroamphetamine (ADDERALL) 10 MG tablet TAKE (1) TABLET BY MOUTH TWICE DAILY. 180 tablet 0  . DULoxetine (CYMBALTA) 30 MG capsule 1 cap po qd 90 capsule 3  . lamoTRIgine (LAMICTAL) 100 MG tablet 1 tab po qd 90 tablet 3  . mirtazapine (REMERON) 30 MG tablet TAKE (1) TABLET BY MOUTH AT BEDTIME. 90 tablet 0  . Continuous Blood Gluc Receiver (FREESTYLE LIBRE 2 READER) DEVI As directed (Patient not taking: Reported on 06/27/2020) 1 each 0  . Continuous Blood Gluc Sensor (FREESTYLE LIBRE 2 SENSOR) MISC 1 Piece by Does not apply route every 14 (fourteen) days. (Patient not taking: Reported on 06/27/2020) 2 each 3   No facility-administered medications prior to visit.     Allergies  Allergen Reactions  . Aspirin Other (See Comments)    Gastric Bypass  . Latex Other (See Comments)    blisters  . Penicillins     UNSPECIFIED REACTION >"Took large quantities as a child & told to never take again Has patient had a PCN reaction causing immediate rash, facial/tongue/throat swelling, SOB or lightheadedness with hypotension: No Has patient had a PCN reaction causing severe rash involving mucus membranes or skin necrosis: No Has patient had a PCN reaction that required hospitalization: No Has patient had a PCN reaction occurring within the last 10 years: No If all of the above answers are "NO", then may proceed wi  . Nickel   . Codeine Itching    ROS As per HPI  PE: Vitals with BMI 06/27/2020 04/05/2020 03/29/2020  Height 5' 2" 5' 2" 5' 2"  Weight 188 lbs 186 lbs 3 oz 191 lbs 6 oz  BMI 60.63 01.60 35  Systolic 109 323 557  Diastolic 80 80 73  Pulse 81 104 105     Gen: Alert, well appearing.  Patient is oriented to person, place, time, and situation. AFFECT: pleasant, lucid thought and speech. CV: RRR, no m/r/g.   LUNGS: CTA bilat, nonlabored resps, good aeration in all lung fields. EXT: no clubbing or cyanosis.  no edema.    LABS:  Lab Results  Component Value Date   TSH 3.22 03/29/2020   Lab Results  Component Value Date   WBC 7.4 03/29/2020   HGB 13.2 03/29/2020   HCT 39.4 03/29/2020   MCV 94.9 03/29/2020   PLT 253.0 03/29/2020   Lab Results  Component Value Date   IRON 80 04/01/2018   IRON 75 04/01/2018   TIBC 314 04/01/2018   FERRITIN 67.3 04/01/2018   Lab Results  Component Value Date   VITAMINB12 493 07/09/2017    Lab Results  Component Value Date   CREATININE 0.87 03/29/2020   BUN 14 03/29/2020   NA 139 03/29/2020   K 4.3 03/29/2020   CL 99 03/29/2020   CO2 33 (H) 03/29/2020   Lab Results  Component Value Date   ALT 33 03/29/2020   AST 39 (H) 03/29/2020   ALKPHOS 106 03/29/2020   BILITOT 0.4 03/29/2020    Lab Results  Component Value Date  CHOL 148 06/15/2019   Lab Results  Component Value Date   HDL 40.50 06/15/2019   Lab Results  Component Value Date   LDLCALC 90 06/15/2019   Lab Results  Component Value Date   TRIG 89.0 06/15/2019   Lab Results  Component Value Date   CHOLHDL 4 06/15/2019   Lab Results  Component Value Date   HGBA1C 9.3 (A) 03/29/2020   IMPRESSION AND PLAN:  1) Recurrent MDD, GAD/agoraphobia, adult ADD->all stable. Cont adderall 23m bid (CSC updated today and 90d supply eRxd.  UDS UTD, will repeat at next f/u in 340mo  Cont clonaz 61m69mid (no new rx for this today).  Remeron 30 qhs and cymbalta 30 qd and lamictal 100m43m all RF'd today.  2) Hx of roux en Y bipass surg, hx of B12 and iron def.  Cont home vit B12 inj 1000 mcg q2 wks and PO iron. Check B12 and iron levels today.  3) Hyperlipidemia, tolerating atorva 20mg47m FLP and hepatic panel today.  4) NAFLD: cirrhotic changes on imaging in the past. AFP level normal 3 mo ago.  Ordered liver u/s but pt wasn't contacted. Will look into this.  An After Visit Summary was printed and given to the patient.  FOLLOW UP: Return in about 3 months (around 09/27/2020) for routine chronic illness f/u.  Signed:  Phil Crissie Sickles          06/27/2020

## 2020-06-28 ENCOUNTER — Other Ambulatory Visit: Payer: Self-pay | Admitting: Family Medicine

## 2020-06-28 LAB — IRON,TIBC AND FERRITIN PANEL
%SAT: 27 % (calc) (ref 16–45)
Ferritin: 73 ng/mL (ref 16–288)
Iron: 86 ug/dL (ref 45–160)
TIBC: 315 mcg/dL (calc) (ref 250–450)

## 2020-07-03 ENCOUNTER — Ambulatory Visit: Payer: Medicare HMO | Admitting: "Endocrinology

## 2020-07-03 ENCOUNTER — Other Ambulatory Visit: Payer: Self-pay

## 2020-07-03 ENCOUNTER — Encounter: Payer: Self-pay | Admitting: "Endocrinology

## 2020-07-03 VITALS — BP 136/72 | HR 88 | Ht 62.0 in | Wt 182.4 lb

## 2020-07-03 DIAGNOSIS — E119 Type 2 diabetes mellitus without complications: Secondary | ICD-10-CM

## 2020-07-03 DIAGNOSIS — E782 Mixed hyperlipidemia: Secondary | ICD-10-CM

## 2020-07-03 LAB — POCT GLYCOSYLATED HEMOGLOBIN (HGB A1C): HbA1c, POC (controlled diabetic range): 8.5 % — AB (ref 0.0–7.0)

## 2020-07-03 MED ORDER — FREESTYLE LIBRE 2 READER DEVI
0 refills | Status: DC
Start: 2020-07-03 — End: 2021-04-15

## 2020-07-03 MED ORDER — NOVOLOG FLEXPEN 100 UNIT/ML ~~LOC~~ SOPN: 10.0000 [IU] | PEN_INJECTOR | Freq: Three times a day (TID) | SUBCUTANEOUS | 1 refills | Status: DC

## 2020-07-03 MED ORDER — METFORMIN HCL ER 500 MG PO TB24: 1000.0000 mg | ORAL_TABLET | Freq: Every day | ORAL | 1 refills | Status: DC

## 2020-07-03 MED ORDER — FREESTYLE LIBRE 2 SENSOR MISC: 1.0000 | 3 refills | Status: DC

## 2020-07-03 MED ORDER — LEVEMIR FLEXTOUCH 100 UNIT/ML ~~LOC~~ SOPN: 80.0000 [IU] | PEN_INJECTOR | Freq: Every day | SUBCUTANEOUS | 1 refills | Status: DC

## 2020-07-03 NOTE — Patient Instructions (Signed)

## 2020-07-03 NOTE — Progress Notes (Signed)
07/03/2020, 4:47 PM  Endocrinology follow-up note   Subjective:    Patient ID: Diana [name], female    DOB: 03-Apr-1955.  Diana [name] is being seen in follow-up after she was seen in consultation for management of currently uncontrolled symptomatic diabetes requested by  Tammi Sou, MD.   Past Medical History:  Diagnosis Date  . Abnormal liver diagnostic imaging 05/20/2017   CT abd->cirrhosis with atypical lesions that MRI f/u determined to be focal regions of severe steatosis.  . Abnormal mammogram 12/2014   F/u diagnostic mammo and u/s NORMAL--resume annual screening mammography  . Arthritis   . Bipolar disorder ([place])    questionable  . Cervical radiculopathy    Post cerv fusion C4-5, C5-6--stable as of 06/2019 NS f/u.  Marland Kitchen Chronic fatigue   . Chronic nausea 2012/13   Normal upper GI  12/2011 (mild GERD noted +small hiatal hernia)+  . Chronic pain syndrome    Chronic C spine pain with radiculopathy.  Dr. Francesco Runner at [facility] was her pain mgmt MD and he discharged her from [facility] pain mgmt center b/c she failed a UDS (+marijuana) 04/27/15. Dr. Trenton Gammon referred her to pain mgmt 2021.  Marland Kitchen Chronic renal insufficiency, stage II (mild) 02/2015   GFR 70s  . Colon cancer screening 03/06/2011   iFob 11/2010 through her insurer was negative. iFOB 01/2015 NEG.  . Depression   . Diabetes mellitus    urine protein-Cr ratio nl 09/2010, D.R. Screen neg 10/2009  . Fibromyalgia   . Fracture of rib of left side 08/14/15   5th and 6th  . Hepatic steatosis 2005   ultrasound.  Also, 07/2017 MRI abd--> plus a few focal areas of more severe steatosis.  Marland Kitchen History of kidney stones   . 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 Hyperlipidemia, mixed 2017; 03/2018   Recommended statin 03/2018  . Insomnia   . Iron deficiency anemia 09/2009   malabsorbtion s/p bariatric surgery  . NAFLD (nonalcoholic fatty liver  disease)    NAFLD->CT 2019 showed cirrhotic changes and f/u MRI showed benign areas of signif increased/severe focal steatosis.  Spleen normal 2019.  . Nephrolithiasis 11/2012  . Neurosis, anxiety, panic type   . Obesity    s/p bariatric surgery  . Osteopenia 12/2010   Hip T score -2.0.  Spine T score -0.5 (plan to repeat in 2 yrs)  . Other B-complex deficiencies   . Panic attacks   . Restless legs syndrome   . Syncopal episodes    7/73/17 in the setting of dehydration and sedative medication  . Tobacco dependence   . Vitamin B12 deficiency   . Vitamin D deficiency 09/2009  . Wears dentures    uppers and lowers    Past Surgical History:  Procedure Laterality Date  . ABDOMINAL HYSTERECTOMY     fibroids, ovaries remain  . APPENDECTOMY    . BACK SURGERY    . BLADDER SURGERY     bladder tack  . CERVICAL SPINE SURGERY  09/2009; 5465;6812   2014-ant cerv decomp (Dr. Carloyn Manner). 2018 fusion (Dr. Annette Stable)  . CHOLECYSTECTOMY  04/2010   w/lysis of adhesions (open procedure)--path showed chronic cholecystitis  and cholesterol polyp.  . CT CERVICAL SPINE W CONTRAST  ([place] [facility])  06/2019   No evidence of hardware complication or loosening of her instrumentation.  . CYSTOSCOPY/RETROGRADE/URETEROSCOPY/STONE EXTRACTION WITH BASKET Left 11/21/2012   Left UVJ stone.  Procedure: CYSTOSCOPY/RETROGRADE/URETEROSCOPY/STONE EXTRACTION WITH BASKET insertion double j stent;  Surgeon: Ailene Rud, MD;  Location: [facility];  Service: Urology;  Laterality: Left;  . EPIDURAL BLOCK INJECTION  03/2011   Cervical  . HERNIA REPAIR  2005   mesenteric hernia repair, with lysis of adhesions (presented with SBO)  . POSTERIOR CERVICAL FUSION/FORAMINOTOMY N/A 12/31/2016   Procedure: Posterior Cervical Fusion with lateral mass fixation - Cervical four-five, Cervical five-six;  Surgeon: Earnie Larsson, MD;  Location: [place];  Service: Neurosurgery;  Laterality: N/A;  . ROUX-EN-Y GASTRIC BYPASS    . TONSILLECTOMY      Social  History   Socioeconomic History  . Marital status: Significant Other    Spouse name: Not on file  . Number of children: Not on file  . Years of education: Not on file  . Highest education level: Not on file  Occupational History  . Not on file  Tobacco Use  . Smoking status: Former Smoker    Packs/day: 1.00    Years: 1.00    Pack years: 1.00    Types: Cigarettes    Quit date: 04/11/2016    Years since quitting: 4.2  . Smokeless tobacco: Never Used  Vaping Use  . Vaping Use: Never used  Substance and Sexual Activity  . Alcohol use: No  . Drug use: No  . Sexual activity: Not on file  Other Topics Concern  . Not on file  Social History Narrative   Lives with longtime boyfriend in [place], [place].   Had a stepson but he committed suicide 2012/13.   Disabled due to anxiety.   Longtime smoker, quits frequently but restarts.   No alcohol or illegal drugs.   No exercise.   Social Determinants of Health   Financial Resource Strain: Low Risk   . Difficulty of Paying Living Expenses: Not hard at all  Food Insecurity: No Food Insecurity  . Worried About Charity fundraiser in the Last Year: Never true  . Ran Out of Food in the Last Year: Never true  Transportation Needs: No Transportation Needs  . Lack of Transportation (Medical): No  . Lack of Transportation (Non-Medical): No  Physical Activity: Inactive  . Days of Exercise per Week: 0 days  . Minutes of Exercise per Session: 0 min  Stress: Stress Concern Present  . Feeling of Stress : To some extent  Social Connections: Moderately Isolated  . Frequency of Communication with Friends and Family: More than three times a week  . Frequency of Social Gatherings with Friends and Family: Once a week  . Attends Religious Services: Never  . Active Member of Clubs or Organizations: No  . Attends Archivist Meetings: Never  . Marital Status: Living with partner    Family History  Problem Relation Age of Onset  . Heart  disease Mother   . Cancer Mother   . Hypertension Mother   . Hyperlipidemia Mother   . Stroke Mother   . Heart disease Father   . Hypertension Father   . Hyperlipidemia Father   . Cancer Sister   . Heart disease Sister   . Alzheimer's disease Brother   . Kidney disease Brother     Outpatient Encounter Medications as of 07/03/2020  Medication  Sig  . B-D ULTRAFINE III SHORT PEN 31G X 8 MM MISC USE AS DIRECTED UP TO 5 TIMES DAILY.  Marland Kitchen Continuous Blood Gluc Receiver (FREESTYLE LIBRE 2 READER) DEVI As directed  . Continuous Blood Gluc Sensor (FREESTYLE LIBRE 2 SENSOR) MISC 1 Piece by Does not apply route every 14 (fourteen) days.  Marland Kitchen amphetamine-dextroamphetamine (ADDERALL) 10 MG tablet TAKE (1) TABLET BY MOUTH TWICE DAILY.  Marland Kitchen atorvastatin (LIPITOR) 20 MG tablet TAKE (1) TABLET BY MOUTH ONCE DAILY.  Marland Kitchen Blood Glucose Monitoring Suppl (ACCU-CHEK GUIDE ME) w/Device KIT 1 Piece by Does not apply route as directed.  . calcium elemental as carbonate (TUMS ULTRA 1000) 400 MG chewable tablet Chew 1,000 mg by mouth daily.  . Cholecalciferol 2000 UNITS CAPS Take 1 capsule (2,000 Units total) by mouth daily. (Patient taking differently: Take 1 capsule by mouth daily. VIT D)  . clonazePAM (KLONOPIN) 2 MG tablet TAKE (1) TABLET BY MOUTH (3) TIMES DAILY.  . cyanocobalamin (,VITAMIN B-12,) 1000 MCG/ML injection INJECT 1ML INTO THE MUSCLE EVERY 14 DAYS.  . DULoxetine (CYMBALTA) 30 MG capsule 1 cap po qd  . FERROUS SULFATE PO Take by mouth.  . gabapentin (NEURONTIN) 300 MG capsule TAKE 4 CAPSULES BY MOUTH AT BEDTIME.  Marland Kitchen glucose blood (ACCU-CHEK GUIDE) test strip Use as instructed  . HYDROcodone-acetaminophen (NORCO) 10-325 MG tablet Take 1-1.5 tablets by mouth 3 (three) times daily as needed for moderate pain or severe pain.  Marland Kitchen insulin aspart (NOVOLOG FLEXPEN) 100 UNIT/ML FlexPen Inject 10-16 Units into the skin 3 (three) times daily before meals.  . insulin detemir (LEVEMIR FLEXTOUCH) 100 UNIT/ML FlexPen  Inject 80 Units into the skin at bedtime.  . lamoTRIgine (LAMICTAL) 100 MG tablet 1 tab po qd  . metFORMIN (GLUCOPHAGE XR) 500 MG 24 hr tablet Take 2 tablets (1,000 mg total) by mouth daily with breakfast.  . mirtazapine (REMERON) 30 MG tablet TAKE (1) TABLET BY MOUTH AT BEDTIME.  Marland Kitchen ondansetron (ZOFRAN) 4 MG tablet TAKE 1 TABLET BY MOUTH EVERY 6 HOURS AS NEEDED FOR NAUSEA.  Marland Kitchen tiZANidine (ZANAFLEX) 4 MG tablet TAKE (1) TABLET BY MOUTH TWICE DAILY.  . [DISCONTINUED] Continuous Blood Gluc Receiver (FREESTYLE LIBRE 2 READER) DEVI As directed (Patient not taking: Reported on 06/27/2020)  . [DISCONTINUED] Continuous Blood Gluc Sensor (FREESTYLE LIBRE 2 SENSOR) MISC 1 Piece by Does not apply route every 14 (fourteen) days. (Patient not taking: Reported on 06/27/2020)  . [DISCONTINUED] insulin detemir (LEVEMIR FLEXTOUCH) 100 UNIT/ML FlexPen Inject 70 Units into the skin at bedtime.  . [DISCONTINUED] metFORMIN (GLUCOPHAGE XR) 500 MG 24 hr tablet Take 1 tablet (500 mg total) by mouth daily with breakfast.  . [DISCONTINUED] NOVOLOG FLEXPEN 100 UNIT/ML FlexPen INJECT 18 UNITS UNDER THE SKIN BEFORE MEALS. (Patient taking differently: Sliding scale)   No facility-administered encounter medications on file as of 07/03/2020.    ALLERGIES: Allergies  Allergen Reactions  . Aspirin Other (See Comments)    Gastric Bypass  . Latex Other (See Comments)    blisters  . Penicillins     UNSPECIFIED REACTION >"Took large quantities as a child & told to never take again Has patient had a PCN reaction causing immediate rash, facial/tongue/throat swelling, SOB or lightheadedness with hypotension: No Has patient had a PCN reaction causing severe rash involving mucus membranes or skin necrosis: No Has patient had a PCN reaction that required hospitalization: No Has patient had a PCN reaction occurring within the last 10 years: No If all of the above answers are "  NO", then may proceed wi  . Nickel   . Codeine Itching     VACCINATION STATUS: Immunization History  Administered Date(s) Administered  . Influenza Split 11/02/2010  . Influenza Whole 02/11/2010  . Influenza,inj,Quad PF,6+ Mos 11/19/2012, 10/20/2014, 11/10/2015, 01/01/2017  . Influenza,inj,quad, With Preservative 01/16/2018  . Janssen (J&J) SARS-COV-2 Vaccination 05/05/2019, 12/29/2019  . Pneumococcal Polysaccharide-23 11/10/2015  . Td 02/12/2003  . Tdap 06/16/2013  . Zoster Recombinat (Shingrix) 06/15/2019, 09/11/2019    Diabetes She presents for her follow-up diabetic visit. She has type 2 diabetes mellitus. Onset time: she was diagnosed at approx age of 82 years. Her disease course has been improving. There are no hypoglycemic associated symptoms. Pertinent negatives for hypoglycemia include no confusion, headaches, pallor or seizures. Associated symptoms include blurred vision, fatigue, foot paresthesias, foot ulcerations, polydipsia and polyuria. Pertinent negatives for diabetes include no chest pain and no polyphagia. There are no hypoglycemic complications. Symptoms are improving. Risk factors for coronary artery disease include dyslipidemia, diabetes mellitus, hypertension, obesity, family history, post-menopausal, sedentary lifestyle and tobacco exposure. Current diabetic treatment includes insulin injections. Her weight is decreasing steadily. She is following a generally unhealthy diet. When asked about meal planning, she reported none. Her [name] blood glucose trend is increasing steadily. Her breakfast blood glucose range is generally 140-180 mg/dl. Her lunch blood glucose range is generally 140-180 mg/dl. Her dinner blood glucose range is generally 140-180 mg/dl. Her bedtime blood glucose range is generally 140-180 mg/dl. Her overall blood glucose range is 140-180 mg/dl. (She presents with better engagement and better glycemic profile.  Her point-of-care A1c is 8.5%, progressively improving.  She did tolerate her metformin 500 mg p.o. daily.   Her most recent blood glucose averages 166 over the last 14 days.  She has no hypoglycemia.) An ACE inhibitor/angiotensin II receptor blocker is not being taken. She does not see a podiatrist.Eye exam is not current.  Hyperlipidemia This is a chronic problem. The current episode started more than 1 year ago. The problem is uncontrolled. Exacerbating diseases include diabetes and obesity. Pertinent negatives include no chest pain, myalgias or shortness of breath. Current antihyperlipidemic treatment includes statins. Risk factors for coronary artery disease include dyslipidemia, diabetes mellitus, obesity, family history, hypertension, post-menopausal and a sedentary lifestyle.  Hypertension This is a chronic problem. The current episode started more than 1 year ago. The problem is controlled. Associated symptoms include blurred vision. Pertinent negatives include no chest pain, headaches, palpitations or shortness of breath. Risk factors for coronary artery disease include dyslipidemia, diabetes mellitus, family history, obesity, post-menopausal state, smoking/tobacco exposure and sedentary lifestyle.    Review of Systems  Constitutional: Positive for fatigue. Negative for chills, fever and unexpected weight change.  HENT: Negative for trouble swallowing and voice change.   Eyes: Positive for blurred vision. Negative for visual disturbance.  Respiratory: Negative for cough, shortness of breath and wheezing.   Cardiovascular: Negative for chest pain, palpitations and leg swelling.  Gastrointestinal: Negative for diarrhea, nausea and vomiting.  Endocrine: Positive for polydipsia and polyuria. Negative for cold intolerance, heat intolerance and polyphagia.  Musculoskeletal: Negative for arthralgias and myalgias.  Skin: Negative for color change, pallor, rash and wound.  Neurological: Negative for seizures and headaches.  Psychiatric/Behavioral: Negative for confusion and suicidal ideas.     Objective:    Vitals with BMI 07/03/2020 06/27/2020 04/05/2020  Height 5' 2"  5' 2"  5' 2"   Weight 182 lbs 6 oz 188 lbs 186 lbs 3 oz  BMI 33.35 42.35 36.14  Systolic 431  885 027  Diastolic 72 80 80  Pulse 88 81 104    BP 136/72   Pulse 88   Ht 5' 2"  (1.575 m)   Wt 182 lb 6.4 oz (82.7 kg)   BMI 33.36 kg/m   Wt Readings from Last 3 Encounters:  07/03/20 182 lb 6.4 oz (82.7 kg)  06/27/20 188 lb (85.3 kg)  04/05/20 186 lb 3.2 oz (84.5 kg)     Physical Exam Constitutional:      Appearance: She is well-developed.  HENT:     Head: Normocephalic and atraumatic.  Neck:     Thyroid: No thyromegaly.     Trachea: No tracheal deviation.  Cardiovascular:     Rate and Rhythm: Normal rate.  Pulmonary:     Effort: Pulmonary effort is normal.  Abdominal:     General: Bowel sounds are normal.     Palpations: Abdomen is soft.     Tenderness: There is no abdominal tenderness. There is no guarding.  Musculoskeletal:        General: Normal range of motion.     Cervical back: Normal range of motion and neck supple.  Skin:    General: Skin is warm and dry.     Coloration: Skin is not pale.     Findings: No erythema or rash.  Neurological:     Mental Status: She is alert and oriented to person, place, and time.     Cranial Nerves: No cranial nerve deficit.     Coordination: Coordination normal.     Deep Tendon Reflexes: Reflexes are normal and symmetric.     Comments: Diminished monofilament test on BLE.  Psychiatric:        Judgment: Judgment normal.     CMP ( most recent) CMP     Component Value Date/Time   NA 136 06/27/2020 0846   K 4.7 06/27/2020 0846   CL 98 06/27/2020 0846   CO2 31 06/27/2020 0846   GLUCOSE 147 (H) 06/27/2020 0846   BUN 13 06/27/2020 0846   CREATININE 0.82 06/27/2020 0846   CREATININE 0.96 12/18/2018 1316   CALCIUM 9.3 06/27/2020 0846   PROT 7.2 06/27/2020 0846   ALBUMIN 4.3 06/27/2020 0846   AST 36 06/27/2020 0846   ALT 30 06/27/2020 0846    ALKPHOS 91 06/27/2020 0846   BILITOT 0.5 06/27/2020 0846   GFRNONAA >60 05/22/2017 0506   GFRAA >60 05/22/2017 0506     Diabetic Labs (most recent): Lab Results  Component Value Date   HGBA1C 8.5 (A) 07/03/2020   HGBA1C 9.3 (A) 03/29/2020   HGBA1C 9.2 (H) 06/15/2019     Lipid Panel ( most recent) Lipid Panel     Component Value Date/Time   CHOL 147 06/27/2020 0846   TRIG 174.0 (H) 06/27/2020 0846   HDL 36.10 (L) 06/27/2020 0846   CHOLHDL 4 06/27/2020 0846   VLDL 34.8 06/27/2020 0846   LDLCALC 76 06/27/2020 0846   LDLDIRECT 174.0 08/08/2015 1229      Lab Results  Component Value Date   TSH 3.22 03/29/2020   TSH 2.43 04/01/2018   TSH 1.85 10/11/2016   TSH 4.83 (H) 11/10/2015   TSH 1.65 07/13/2014   TSH 2.46 06/16/2013   TSH 0.82 12/27/2011   TSH 1.954 07/04/2011   TSH 3.060 08/27/2010   TSH 3.409 01/31/2010   FREET4 0.96 10/11/2016   FREET4 0.89 11/14/2015       Assessment & Plan:   1. Type 2 diabetes mellitus without complication, without  long-term current use of insulin ([place])   - Shil[place] Jessa Stinson has currently uncontrolled symptomatic type 2 DM since 65 years of age.  She presents with better engagement and better glycemic profile.  Her point-of-care A1c is 8.5%, progressively improving.  She did tolerate her metformin 500 mg p.o. daily.  Her most recent blood glucose averages 166 over the last 14 days.  She has no hypoglycemia.  Recent labs reviewed. - I had a long discussion with her about the progressive nature of diabetes and the pathology behind its complications. -her diabetes is complicated by obesity/ sedentary life and she remains at a high risk for more acute and chronic complications which include CAD, CVA, CKD, retinopathy, and neuropathy. These are all discussed in detail with her.  - I have counseled her on diet  and weight management  by adopting a carbohydrate restricted/protein rich diet. Patient is encouraged to switch to  unprocessed  or minimally processed     complex starch and increased protein intake (animal or plant source), fruits, and vegetables. -  she is advised to stick to a routine mealtimes to eat 3 meals  a day and avoid unnecessary snacks ( to snack only to correct hypoglycemia).   - she acknowledges that there is a room for improvement in her food and drink choices. - Suggestion is made for her to avoid simple carbohydrates  from her diet including Cakes, Sweet Desserts, Ice Cream, Soda (diet and regular), Sweet Tea, Candies, Chips, Cookies, Store Bought Juices, Alcohol in Excess of  1-2 drinks a day, Artificial Sweeteners,  Coffee Creamer, and "Sugar-free" Products, Lemonade. This will help patient to have more stable blood glucose profile and potentially avoid unintended weight gain.    - she will be scheduled with Jearld Fenton, RDN, CDE for diabetes education.  - I have approached her with the following individualized plan to manage  her diabetes and patient agrees:   - she will continue to need intensive treatment with basal/bolus insulin  to achieve control of diabetes, but will benefit from de escalation in the doses of insulin. -she is advised to increase Levemir to 80 units daily at bedtime, continue NovoLog 10 units 3 times a day with meals  for pre-meal BG readings of 90-137m/dl, plus patient specific correction dose for unexpected hyperglycemia above 1563mdl, associated with strict monitoring of glucose 4 times a day-before meals and at bedtime. - she is warned not to take insulin without proper monitoring per orders. - Adjustment parameters are given to her for hypo and hyperglycemia in writing. - she is encouraged to call clinic for blood glucose levels less than 70 or above 300 mg /dl. - she has tolerated the low-dose Metformin.  She is asking for a higher dose of metformin.  I discussed and increase metformin to 1000 mg ER daily after breakfast.   She will be considered for incretin therapy as  appropriate on subsequent visits. This patient would benefit from a CGM.  I sent another prescription to her local pharmacy. - Specific targets for  A1c;  LDL, HDL,  and Triglycerides were discussed with the patient.  2) Blood Pressure /Hypertension:  her blood pressure is controlled to targetshe is not on anti hypertensive meds.  3) Lipids/Hyperlipidemia:   Review of her recent lipid panel showed controlled  LDL at 91 .  she  is advised to continue atorvastatin 20 mg p.o. daily at bedtime.     Side effects and precautions discussed with her.  4)  Weight/Diet:  Body mass index is 33.36 kg/m.  -   clearly complicating her diabetes care.   she is  a candidate for weight loss. I discussed with her the fact that loss of 5 - 10% of her  current body weight will have the most impact on her diabetes management.  Exercise, and detailed carbohydrates information provided  -  detailed on discharge instructions.  5) Chronic Care/Health Maintenance:  -she  is on  Statin medications and  is encouraged to initiate and continue to follow up with Ophthalmology, Dentist,  Podiatrist at least yearly or according to recommendations, and advised to   stay away from smoking. I have recommended yearly flu vaccine and pneumonia vaccine at least every 5 years; moderate intensity exercise for up to 150 minutes weekly; and  sleep for at least 7 hours a day.  She is advised to discontinue vitamin B12 for now.  Her recent screening ABI was normal on April 05, 2020.  Her neck study is due in February 2027, or sooner if needed.   - she is  advised to maintain close follow up with McGowen, Adrian Blackwater, MD for primary care needs, as well as her other providers for optimal and coordinated care.   I spent 41mnutes in the care of the patient today including [place] [facility] [place] [facility], Thyroid Function, Hematology (current and previous including abstractions from other facilities); face-to-face time discussing  her blood  glucose readings/logs, discussing hypoglycemia and hyperglycemia episodes and symptoms, medications doses, her options of short and long term treatment based on the latest standards of care / guidelines;  discussion about incorporating lifestyle medicine;  and documenting the encounter.    Please refer to Patient Instructions for Blood Glucose Monitoring and Insulin/Medications Dosing Guide"  in media tab for additional information. Please  also refer to " Patient Self Inventory" in the Media  tab for reviewed elements of pertinent patient history.  RLeslee Homeparticipated in the discussions, expressed understanding, and voiced agreement with the above plans.  All questions were answered to her satisfaction. she is encouraged to contact clinic should she have any questions or concerns prior to her return visit.     Follow up plan: - Return in about 3 months (around 10/03/2020) for Bring Meter and Logs- A1c in Office.  GGlade Lloyd MD [facility] [place] [address]Phone: [phone] Fax: [phone]   07/03/2020, 4:47 PM  This note was partially dictated with voice recognition software. Similar sounding words can be transcribed inadequately or may not  be corrected upon review.

## 2020-08-02 ENCOUNTER — Other Ambulatory Visit: Payer: Self-pay | Admitting: "Endocrinology

## 2020-08-21 ENCOUNTER — Other Ambulatory Visit: Payer: Self-pay | Admitting: "Endocrinology

## 2020-08-21 DIAGNOSIS — E119 Type 2 diabetes mellitus without complications: Secondary | ICD-10-CM

## 2020-09-11 HISTORY — PX: OTHER SURGICAL HISTORY: SHX169

## 2020-09-14 ENCOUNTER — Ambulatory Visit
Admission: RE | Admit: 2020-09-14 | Discharge: 2020-09-14 | Disposition: A | Discharge status: Home / Self Care | Payer: Medicare HMO | Source: Ambulatory Visit | Attending: Family Medicine | Admitting: Family Medicine

## 2020-09-14 ENCOUNTER — Other Ambulatory Visit: Payer: Self-pay

## 2020-09-14 DIAGNOSIS — M8589 Other specified disorders of bone density and structure, multiple sites: Secondary | ICD-10-CM | POA: Diagnosis not present

## 2020-09-14 DIAGNOSIS — M8580 Other specified disorders of bone density and structure, unspecified site: Secondary | ICD-10-CM

## 2020-09-14 DIAGNOSIS — Z1231 Encounter for screening mammogram for malignant neoplasm of breast: Secondary | ICD-10-CM

## 2020-09-14 DIAGNOSIS — E2839 Other primary ovarian failure: Secondary | ICD-10-CM

## 2020-09-14 DIAGNOSIS — Z780 Asymptomatic menopausal state: Secondary | ICD-10-CM | POA: Diagnosis not present

## 2020-09-18 ENCOUNTER — Encounter: Payer: Self-pay | Admitting: Family Medicine

## 2020-10-06 ENCOUNTER — Other Ambulatory Visit: Payer: Self-pay | Admitting: Family Medicine

## 2020-10-06 DIAGNOSIS — Z79899 Other long term (current) drug therapy: Secondary | ICD-10-CM

## 2020-10-06 DIAGNOSIS — F909 Attention-deficit hyperactivity disorder, unspecified type: Secondary | ICD-10-CM

## 2020-10-06 NOTE — Telephone Encounter (Signed)
RF request for atorvastatin LOV: 06/27/20 Next ov: 11/13/20 Last written: 03/27/20(90,0)  RF request for gabapentin LOV:06/27/20 Next ov: 11/13/20 Last written: 03/27/20(360,0)  Requesting: adderall Contract: 06/27/20 UDS: 03/29/20 Last Visit:06/27/20 Next Visit:11/13/20 Last Refill: 06/27/20(180,0)  Please review and advise, meds pending

## 2020-10-09 ENCOUNTER — Ambulatory Visit: Payer: Medicare HMO | Admitting: "Endocrinology

## 2020-11-13 ENCOUNTER — Ambulatory Visit: Payer: Medicare HMO | Admitting: Family Medicine

## 2020-11-28 DIAGNOSIS — Z6833 Body mass index (BMI) 33.0-33.9, adult: Secondary | ICD-10-CM | POA: Diagnosis not present

## 2020-11-28 DIAGNOSIS — M533 Sacrococcygeal disorders, not elsewhere classified: Secondary | ICD-10-CM | POA: Diagnosis not present

## 2020-11-28 DIAGNOSIS — R030 Elevated blood-pressure reading, without diagnosis of hypertension: Secondary | ICD-10-CM | POA: Diagnosis not present

## 2020-11-28 DIAGNOSIS — G894 Chronic pain syndrome: Secondary | ICD-10-CM | POA: Diagnosis not present

## 2020-11-28 DIAGNOSIS — M5412 Radiculopathy, cervical region: Secondary | ICD-10-CM | POA: Diagnosis not present

## 2020-12-08 ENCOUNTER — Other Ambulatory Visit: Payer: Self-pay | Admitting: Family Medicine

## 2020-12-08 MED ORDER — CLONAZEPAM 2 MG PO TABS: ORAL_TABLET | ORAL | 5 refills | Status: DC

## 2020-12-08 NOTE — Telephone Encounter (Signed)
Requesting: clonazepam Contract: 06/27/20 UDS:03/29/20 Last Visit:06/27/20 Next Visit:01/09/21 Last Refill:04/25/20(90,5)  Please Advise. Med pending

## 2020-12-08 NOTE — Telephone Encounter (Signed)
Caller Name: pt Call back phone #: [phone]  MEDICATION(S): clonazepam - pt has 3 pills left - pt has no refills Pt has been sick and her husband was in hospital for blood clot  Has the patient contacted their pharmacy? Yes.   IF NO, request that the patient contact the pharmacy for the refills in the future & the pharmacy will send an electronic request (except for controlled medications). IF YES, when and what did the pharmacy advise? Told zero refills  Preferred Pharmacy:  [facility], [place] Phone:  [phone]  Fax:  [phone]      Next OV 01/09/2021

## 2020-12-21 ENCOUNTER — Telehealth: Payer: Self-pay | Admitting: "Endocrinology

## 2020-12-21 DIAGNOSIS — E119 Type 2 diabetes mellitus without complications: Secondary | ICD-10-CM

## 2020-12-21 MED ORDER — BD PEN NEEDLE SHORT U/F 31G X 8 MM MISC: 2 refills | Status: DC

## 2020-12-21 NOTE — Telephone Encounter (Signed)
Patient is requesting a refill on her pen needles. Please send to [facility]

## 2020-12-21 NOTE — Telephone Encounter (Signed)
Rx sent 

## 2021-01-01 ENCOUNTER — Ambulatory Visit: Payer: Medicare HMO | Admitting: "Endocrinology

## 2021-01-02 ENCOUNTER — Other Ambulatory Visit: Payer: Self-pay | Admitting: Family Medicine

## 2021-01-03 NOTE — Telephone Encounter (Signed)
RF request for zofran LOV: 06/27/20 Next ov: 01/09/21 Last written: 12/03/19(20,0)  Please review and advise. Med pending

## 2021-01-09 ENCOUNTER — Ambulatory Visit: Payer: Medicare HMO | Admitting: Family Medicine

## 2021-01-09 DIAGNOSIS — M62838 Other muscle spasm: Secondary | ICD-10-CM | POA: Diagnosis not present

## 2021-01-09 DIAGNOSIS — M533 Sacrococcygeal disorders, not elsewhere classified: Secondary | ICD-10-CM | POA: Diagnosis not present

## 2021-01-09 DIAGNOSIS — G894 Chronic pain syndrome: Secondary | ICD-10-CM | POA: Diagnosis not present

## 2021-01-09 DIAGNOSIS — M5412 Radiculopathy, cervical region: Secondary | ICD-10-CM | POA: Diagnosis not present

## 2021-02-08 ENCOUNTER — Other Ambulatory Visit: Payer: Self-pay

## 2021-02-08 ENCOUNTER — Telehealth: Payer: Self-pay | Admitting: "Endocrinology

## 2021-02-08 DIAGNOSIS — E119 Type 2 diabetes mellitus without complications: Secondary | ICD-10-CM

## 2021-02-08 MED ORDER — LEVEMIR FLEXTOUCH 100 UNIT/ML ~~LOC~~ SOPN: 80.0000 [IU] | PEN_INJECTOR | Freq: Every day | SUBCUTANEOUS | 0 refills | Status: DC

## 2021-02-08 MED ORDER — NOVOLOG FLEXPEN 100 UNIT/ML ~~LOC~~ SOPN: 10.0000 [IU] | PEN_INJECTOR | Freq: Three times a day (TID) | SUBCUTANEOUS | 0 refills | Status: DC

## 2021-02-08 NOTE — Telephone Encounter (Signed)
We can give her limited supply to hold her over until her follow up appt with Nida on 1/23

## 2021-02-08 NOTE — Telephone Encounter (Signed)
Pt called and requesting a refill.  insulin aspart (NOVOLOG FLEXPEN) 100 UNIT/ML FlexPen   insulin detemir (LEVEMIR FLEXTOUCH) 100 UNIT/ML FlexPen  [facility] [place] [facility] [place] Phone:  [phone]  Fax:  [phone]

## 2021-02-08 NOTE — Telephone Encounter (Signed)
Medications have been sent to the requested pharmacy. 

## 2021-02-10 ENCOUNTER — Other Ambulatory Visit: Payer: Self-pay | Admitting: "Endocrinology

## 2021-03-05 ENCOUNTER — Ambulatory Visit: Payer: Medicare HMO | Admitting: "Endocrinology

## 2021-03-15 ENCOUNTER — Other Ambulatory Visit: Payer: Self-pay | Admitting: "Endocrinology

## 2021-03-15 ENCOUNTER — Telehealth: Payer: Self-pay | Admitting: Family Medicine

## 2021-03-15 DIAGNOSIS — E119 Type 2 diabetes mellitus without complications: Secondary | ICD-10-CM

## 2021-03-15 NOTE — Telephone Encounter (Signed)
Attempted to contact pt regarding [place]. Pt order is currently still active until the 16th. Pt can contact radiology with number provided by Lorriane Shire in [place] message from 06/2020.

## 2021-03-15 NOTE — Telephone Encounter (Signed)
Pt called today about referral for ultrasound for liver.   Checked referral tab there was a scan for a liver referred back 03/29/2020. Pt said she's under new insurance ([employer]) no longer has [employer]  Wanting to go through with the imaging for her abdomen (liver).   Pt cell: [phone]

## 2021-03-16 NOTE — Telephone Encounter (Signed)
Pt advised of information below. She will contact Forestine Na for assistance with scheduling.

## 2021-04-05 ENCOUNTER — Other Ambulatory Visit: Payer: Self-pay | Admitting: Family Medicine

## 2021-04-05 DIAGNOSIS — Z79899 Other long term (current) drug therapy: Secondary | ICD-10-CM

## 2021-04-05 DIAGNOSIS — F909 Attention-deficit hyperactivity disorder, unspecified type: Secondary | ICD-10-CM

## 2021-04-06 NOTE — Telephone Encounter (Signed)
Requesting: adderall Contract: 06/27/20 UDS: 03/29/20 Last Visit: 06/27/20 Next Visit: 05/09/21 Last Refill: 10/06/20(180,0)  RF request for duloxetine LOV: 06/27/20 Next ov: 05/09/21  Last written: 06/27/20(90,3)   RF request for mirtazpine LOV: 06/27/20 Next ov: 05/09/21 Last written: 06/27/20(90,3)  Please review and advise. Meds pending

## 2021-04-10 ENCOUNTER — Ambulatory Visit: Payer: Self-pay | Admitting: "Endocrinology

## 2021-04-15 ENCOUNTER — Other Ambulatory Visit: Payer: Self-pay | Admitting: Family Medicine

## 2021-04-15 ENCOUNTER — Other Ambulatory Visit: Payer: Self-pay | Admitting: "Endocrinology

## 2021-04-15 DIAGNOSIS — E119 Type 2 diabetes mellitus without complications: Secondary | ICD-10-CM

## 2021-04-16 MED ORDER — FREESTYLE LIBRE 2 READER DEVI: 0 refills | Status: DC

## 2021-04-16 MED ORDER — LEVEMIR FLEXTOUCH 100 UNIT/ML ~~LOC~~ SOPN: 80.0000 [IU] | PEN_INJECTOR | Freq: Every day | SUBCUTANEOUS | 0 refills | Status: DC

## 2021-04-16 NOTE — Telephone Encounter (Signed)
Requesting: Norco ?Contract: 06/27/20 (clonazepam and adderall) ?UDS: 03/29/20 ?Last Visit:  ?Next Visit: 05/09/21 ?Last Refill: 01/01/17-06/30/17 Ferne Reus PA-C w/ [place] [facility] ? ?Please Advise. Med pending. Pt has received refills for other meds from [facility]. ( Continuous Blood Gluc Receiver and Levemir) ?

## 2021-04-18 ENCOUNTER — Other Ambulatory Visit: Payer: Self-pay

## 2021-04-18 ENCOUNTER — Ambulatory Visit (INDEPENDENT_AMBULATORY_CARE_PROVIDER_SITE_OTHER): Payer: No Typology Code available for payment source

## 2021-04-18 ENCOUNTER — Ambulatory Visit: Payer: Medicare HMO | Admitting: Family Medicine

## 2021-04-18 DIAGNOSIS — Z0000 Encounter for general adult medical examination without abnormal findings: Secondary | ICD-10-CM | POA: Diagnosis not present

## 2021-04-18 NOTE — Patient Instructions (Addendum)
[name] , Thank you for taking time to come for your Medicare Wellness Visit. I appreciate your ongoing commitment to your health goals. Please review the following plan we discussed and let me know if I can assist you in the future.   Screening recommendations/referrals: Colonoscopy: Done 02/13/15 repeat every 10 years  Mammogram: Done 09/14/20 repeat every year  Bone Density: Done 09/14/20 repeat every 2 years  Recommended yearly ophthalmology/optometry visit for glaucoma screening and checkup Recommended yearly dental visit for hygiene and checkup  Vaccinations: Influenza vaccine: Due and discussed  Tdap vaccine: Done 06/16/13 repeat every 10 years  Shingles vaccine: Done 5/4, 09/11/19  Covid-19: Completed 3/24, 12/29/19  Advanced directives: Advance directive discussed with you today. I have provided a copy for you to complete at home and have notarized. Once this is complete please bring a copy in to our office so we can scan it into your chart.  Conditions/risks identified: Get blood sugar lowered and lose weight   Next appointment: Follow up in one year for your annual wellness visit.   Preventive Care 40-64 Years, Female Preventive care refers to lifestyle choices and visits with your health care provider that can promote health and wellness. What does preventive care include? A yearly physical exam. This is also called an annual well check. Dental exams once or twice a year. Routine eye exams. Ask your health care provider how often you should have your eyes checked. Personal lifestyle choices, including: Daily care of your teeth and gums. Regular physical activity. Eating a healthy diet. Avoiding tobacco and drug use. Limiting alcohol use. Practicing safe sex. Taking low-dose aspirin daily starting at age 12. Taking vitamin and mineral supplements as recommended by your health care provider. What happens during an annual well check? The services and screenings done by your  health care provider during your annual well check will depend on your age, overall health, lifestyle risk factors, and family history of disease. Counseling  Your health care provider may ask you questions about your: Alcohol use. Tobacco use. Drug use. Emotional well-being. Home and relationship well-being. Sexual activity. Eating habits. Work and work Statistician. Method of birth control. Menstrual cycle. Pregnancy history. Screening  You may have the following tests or measurements: Height, weight, and BMI. Blood pressure. Lipid and cholesterol levels. These may be checked every 5 years, or more frequently if you are over 86 years old. Skin check. Lung cancer screening. You may have this screening every year starting at age 31 if you have a 30-pack-year history of smoking and currently smoke or have quit within the past 15 years. Fecal occult blood test (FOBT) of the stool. You may have this test every year starting at age 43. Flexible sigmoidoscopy or colonoscopy. You may have a sigmoidoscopy every 5 years or a colonoscopy every 10 years starting at age 81. Hepatitis C blood test. Hepatitis B blood test. Sexually transmitted disease (STD) testing. Diabetes screening. This is done by checking your blood sugar (glucose) after you have not eaten for a while (fasting). You may have this done every 1-3 years. Mammogram. This may be done every 1-2 years. Talk to your health care provider about when you should start having regular mammograms. This may depend on whether you have a family history of breast cancer. BRCA-related cancer screening. This may be done if you have a family history of breast, ovarian, tubal, or peritoneal cancers. Pelvic exam and Pap test. This may be done every 3 years starting at age 63.  Starting at age 9, this may be done every 5 years if you have a Pap test in combination with an HPV test. Bone density scan. This is done to screen for osteoporosis. You may have  this scan if you are at high risk for osteoporosis. Discuss your test results, treatment options, and if necessary, the need for more tests with your health care provider. Vaccines  Your health care provider may recommend certain vaccines, such as: Influenza vaccine. This is recommended every year. Tetanus, diphtheria, and acellular pertussis (Tdap, Td) vaccine. You may need a Td booster every 10 years. Zoster vaccine. You may need this after age 71. Pneumococcal 13-valent conjugate (PCV13) vaccine. You may need this if you have certain conditions and were not previously vaccinated. Pneumococcal polysaccharide (PPSV23) vaccine. You may need one or two doses if you smoke cigarettes or if you have certain conditions. Talk to your health care provider about which screenings and vaccines you need and how often you need them. This information is not intended to replace advice given to you by your health care provider. Make sure you discuss any questions you have with your health care provider. Document Released: 02/24/2015 Document Revised: 10/18/2015 Document Reviewed: 11/29/2014 Elsevier Interactive Patient Education  2017 [place] Prevention in the Home Falls can cause injuries. They can happen to people of all ages. There are many things you can do to make your home safe and to help prevent falls. What can I do on the outside of my home? Regularly fix the edges of walkways and driveways and fix any cracks. Remove anything that might make you trip as you walk through a door, such as a raised step or threshold. Trim any bushes or trees on the path to your home. Use bright outdoor lighting. Clear any walking paths of anything that might make someone trip, such as rocks or tools. Regularly check to see if handrails are loose or broken. Make sure that both sides of any steps have handrails. Any raised decks and porches should have guardrails on the edges. Have any leaves, snow, or  ice cleared regularly. Use sand or salt on walking paths during winter. Clean up any spills in your garage right away. This includes oil or grease spills. What can I do in the bathroom? Use night lights. Install grab bars by the toilet and in the tub and shower. Do not use towel bars as grab bars. Use non-skid mats or decals in the tub or shower. If you need to sit down in the shower, use a plastic, non-slip stool. Keep the floor dry. Clean up any water that spills on the floor as soon as it happens. Remove soap buildup in the tub or shower regularly. Attach bath mats securely with double-sided non-slip rug tape. Do not have throw rugs and other things on the floor that can make you trip. What can I do in the bedroom? Use night lights. Make sure that you have a light by your bed that is easy to reach. Do not use any sheets or blankets that are too big for your bed. They should not hang down onto the floor. Have a firm chair that has side arms. You can use this for support while you get dressed. Do not have throw rugs and other things on the floor that can make you trip. What can I do in the kitchen? Clean up any spills right away. Avoid walking on wet floors. Keep items that you use  a lot in easy-to-reach places. If you need to reach something above you, use a strong step stool that has a grab bar. Keep electrical cords out of the way. Do not use floor polish or wax that makes floors slippery. If you must use wax, use non-skid floor wax. Do not have throw rugs and other things on the floor that can make you trip. What can I do with my stairs? Do not leave any items on the stairs. Make sure that there are handrails on both sides of the stairs and use them. Fix handrails that are broken or loose. Make sure that handrails are as long as the stairways. Check any carpeting to make sure that it is firmly attached to the stairs. Fix any carpet that is loose or worn. Avoid having throw rugs at  the top or bottom of the stairs. If you do have throw rugs, attach them to the floor with carpet tape. Make sure that you have a light switch at the top of the stairs and the bottom of the stairs. If you do not have them, ask someone to add them for you. What else can I do to help prevent falls? Wear shoes that: Do not have high heels. Have rubber bottoms. Are comfortable and fit you well. Are closed at the toe. Do not wear sandals. If you use a stepladder: Make sure that it is fully opened. Do not climb a closed stepladder. Make sure that both sides of the stepladder are locked into place. Ask someone to hold it for you, if possible. Clearly mark and make sure that you can see: Any grab bars or handrails. First and last steps. Where the edge of each step is. Use tools that help you move around (mobility aids) if they are needed. These include: Canes. Walkers. Scooters. Crutches. Turn on the lights when you go into a dark area. Replace any light bulbs as soon as they burn out. Set up your furniture so you have a clear path. Avoid moving your furniture around. If any of your floors are uneven, fix them. If there are any pets around you, be aware of where they are. Review your medicines with your doctor. Some medicines can make you feel dizzy. This can increase your chance of falling. Ask your doctor what other things that you can do to help prevent falls. This information is not intended to replace advice given to you by your health care provider. Make sure you discuss any questions you have with your health care provider. Document Released: 11/24/2008 Document Revised: 07/06/2015 Document Reviewed: 03/04/2014 Elsevier Interactive Patient Education  2017 [employer].

## 2021-04-18 NOTE — Progress Notes (Addendum)
Virtual Visit via Telephone Note  I connected with  Diana [name] on 04/18/21 at  3:15 PM EST by telephone and verified that I am speaking with the correct person using two identifiers.  Medicare Annual Wellness visit completed telephonically due to Covid-19 pandemic.   Persons participating in this call: This Health Coach and this patient.   Location: Patient: Home Provider: Office   I discussed the limitations, risks, security and privacy concerns of performing an evaluation and management service by telephone and the availability of in person appointments. The patient expressed understanding and agreed to proceed.  Unable to perform video visit due to video visit attempted and failed and/or patient does not have video capability.   Some vital signs may be absent or patient reported.   Willette Brace, LPN   Subjective:   Diana [name] is a 66 y.o. female who presents for Medicare Annual (Subsequent) preventive examination.  Review of Systems     Cardiac Risk Factors include: diabetes mellitus;dyslipidemia;obesity (BMI >30kg/m2)     Objective:    There were no vitals filed for this visit. There is no height or weight on file to calculate BMI.  Advanced Directives 04/18/2021 05/20/2017 05/19/2017 12/26/2016 10/11/2016 08/14/2015 11/21/2012  Does Patient Have a Medical Advance Directive? No No No No No No Patient does not have advance directive;Patient would not like information  Would patient like information on creating a medical advance directive? Yes (MAU/Ambulatory/Procedural Areas - Information given) No - Patient declined No - Patient declined Yes (MAU/Ambulatory/Procedural Areas - Information given) Yes (MAU/Ambulatory/Procedural Areas - Information given) - -  Pre-existing out of facility DNR order (yellow form or pink MOST form) - - - - - - No    Current Medications (verified) Outpatient Encounter Medications as of 04/18/2021  Medication Sig   ACCU-CHEK GUIDE  test strip USE TO TEST BLOOD SUGAR UP TO 5 TIMES DAILY.   amphetamine-dextroamphetamine (ADDERALL) 10 MG tablet TAKE (1) TABLET BY MOUTH TWICE DAILY.   atorvastatin (LIPITOR) 20 MG tablet TAKE (1) TABLET BY MOUTH ONCE DAILY.   Blood Glucose Monitoring Suppl (ACCU-CHEK GUIDE ME) w/Device KIT 1 Piece by Does not apply route as directed.   calcium elemental as carbonate (TUMS ULTRA 1000) 400 MG chewable tablet Chew 1,000 mg by mouth daily.   Cholecalciferol 2000 UNITS CAPS Take 1 capsule (2,000 Units total) by mouth daily. (Patient taking differently: Take 1 capsule by mouth daily. VIT D)   clonazePAM (KLONOPIN) 2 MG tablet TAKE (1) TABLET BY MOUTH (3) TIMES DAILY.   Continuous Blood Gluc Receiver (FREESTYLE LIBRE 2 READER) DEVI As directed   Continuous Blood Gluc Sensor (FREESTYLE LIBRE 2 SENSOR) MISC 1 Piece by Does not apply route every 14 (fourteen) days.   cyanocobalamin (,VITAMIN B-12,) 1000 MCG/ML injection INJECT 1ML INTO THE MUSCLE EVERY 14 DAYS.   DULoxetine (CYMBALTA) 30 MG capsule TAKE (1) CAPSULE BY MOUTH ONCE DAILY.   FERROUS SULFATE PO Take by mouth.   gabapentin (NEURONTIN) 300 MG capsule TAKE 4 CAPSULES BY MOUTH AT BEDTIME.   HYDROcodone-acetaminophen (NORCO) 10-325 MG tablet Take 1-1.5 tablets by mouth 3 (three) times daily as needed for moderate pain or severe pain.   insulin aspart (NOVOLOG FLEXPEN) 100 UNIT/ML FlexPen Inject 10 Units into the skin 3 (three) times daily before meals.   insulin detemir (LEVEMIR FLEXTOUCH) 100 UNIT/ML FlexPen Inject 80 Units into the skin at bedtime.   Insulin Pen Needle (B-D ULTRAFINE III SHORT PEN) 31G X 8 MM MISC USE  AS DIRECTED TO INJECT INSULIN UP TO 5 TIMES DAILY.   lamoTRIgine (LAMICTAL) 100 MG tablet 1 tab po qd   metFORMIN (GLUCOPHAGE-XR) 500 MG 24 hr tablet TAKE (2) TABLET BY MOUTH ONCE DAILY WITH BREAKFAST.   mirtazapine (REMERON) 30 MG tablet TAKE (1) TABLET BY MOUTH AT BEDTIME.   ondansetron (ZOFRAN) 4 MG tablet TAKE 1 TABLET BY MOUTH  EVERY 6 HOURS AS NEEDED FOR NAUSEA.   tiZANidine (ZANAFLEX) 4 MG tablet TAKE (1) TABLET BY MOUTH TWICE DAILY.   No facility-administered encounter medications on file as of 04/18/2021.    Allergies (verified) Aspirin, Latex, Penicillins, Nickel, and Codeine   History: Past Medical History:  Diagnosis Date   Abnormal liver diagnostic imaging 05/20/2017   CT abd->cirrhosis with atypical lesions that MRI f/u determined to be focal regions of severe steatosis.   Abnormal mammogram 12/2014   F/u diagnostic mammo and u/s NORMAL--resume annual screening mammography   Arthritis    Bipolar disorder ([place])    questionable   Cervical radiculopathy    Post cerv fusion C4-5, C5-6--stable as of 06/2019 NS f/u.   Chronic fatigue    Chronic nausea 2012/13   Normal upper GI  12/2011 (mild GERD noted +small hiatal hernia)+   Chronic pain syndrome    Chronic C spine pain with radiculopathy.  Dr. Francesco Runner at [facility] was her pain mgmt MD and he discharged her from [facility] pain mgmt center b/c she failed a UDS (+marijuana) 04/27/15. Dr. Trenton Gammon referred her to pain mgmt 2021.   Chronic renal insufficiency, stage II (mild) 02/2015   GFR 70s   Colon cancer screening 03/06/2011   iFob 11/2010 through her insurer was negative. iFOB 01/2015 NEG.   Depression    Diabetes mellitus    urine protein-Cr ratio nl 09/2010, D.R. Screen neg 10/2009   Fibromyalgia    Fracture of rib of left side 08/14/2015   5th and 6th   Hepatic steatosis 2005   ultrasound.  Also, 07/2017 MRI abd--> plus a few focal areas of more severe steatosis.   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  Spleen  normal 2019.   Nephrolithiasis 11/2012   Neurosis, anxiety, panic type    Obesity    s/p bariatric surgery   Osteopenia 12/2010   2012 Hip T score -2.  09/2020 T score -2.2   Other B-complex deficiencies    Panic attacks    Restless legs syndrome    Syncopal episodes    7/73/17 in the setting of dehydration and sedative medication   Tobacco dependence    Vitamin B12 deficiency    Vitamin D deficiency 09/2009   Wears dentures    uppers and lowers   Past Surgical History:  Procedure Laterality Date   ABDOMINAL HYSTERECTOMY     fibroids, ovaries remain   APPENDECTOMY     BACK SURGERY     BLADDER SURGERY     bladder tack   CERVICAL SPINE SURGERY  09/2009; 2703;5009   2014-ant cerv decomp (Dr. Carloyn Manner). 2018 fusion (Dr. Annette Stable)   [place]  04/2010   w/lysis of adhesions (open procedure)--path showed chronic cholecystitis and cholesterol polyp.  CT CERVICAL SPINE W CONTRAST  ([place] [facility])  06/2019   No evidence of hardware complication or loosening of her instrumentation.   CYSTOSCOPY/RETROGRADE/URETEROSCOPY/STONE EXTRACTION WITH BASKET Left 11/21/2012   Left UVJ stone.  Procedure: CYSTOSCOPY/RETROGRADE/URETEROSCOPY/STONE EXTRACTION WITH BASKET insertion double j stent;  Surgeon: Ailene Rud, MD;  Location: [facility];  Service: Urology;  Laterality: Left;   DEXA  09/2020   T score -2.2. Rpt 2 yrs.   EPIDURAL BLOCK INJECTION  03/2011   Cervical   HERNIA REPAIR  2005   mesenteric hernia repair, with lysis of adhesions (presented with SBO)   POSTERIOR CERVICAL FUSION/FORAMINOTOMY N/A 12/31/2016   Procedure: Posterior Cervical Fusion with lateral mass fixation - Cervical four-five, Cervical five-six;  Surgeon: Earnie Larsson, MD;  Location: [place];  Service: Neurosurgery;  Laterality: N/A;   ROUX-EN-Y GASTRIC BYPASS     TONSILLECTOMY     Family History  Problem Relation Age of Onset   Heart disease Mother    Cancer Mother    Hypertension Mother    Hyperlipidemia Mother     Stroke Mother    Heart disease Father    Hypertension Father    Hyperlipidemia Father    Cancer Sister    Heart disease Sister    Alzheimer's disease Brother    Kidney disease Brother    Social History   Socioeconomic History   Marital status: Significant Other    Spouse name: Not on file   Number of children: Not on file   Years of education: Not on file   Highest education level: Not on file  Occupational History   Not on file  Tobacco Use   Smoking status: Former    Packs/day: 1.00    Years: 1.00    Pack years: 1.00    Types: Cigarettes    Quit date: 04/11/2016    Years since quitting: 5.0   Smokeless tobacco: Never  Vaping Use   Vaping Use: Never used  Substance and Sexual Activity   Alcohol use: No   Drug use: No   Sexual activity: Not on file  Other Topics Concern   Not on file  Social History Narrative   Lives with longtime boyfriend in [place], [place].   Had a stepson but he committed suicide 2012/13.   Disabled due to anxiety.   Longtime smoker, quits frequently but restarts.   No alcohol or illegal drugs.   No exercise.   Social Determinants of Health   Financial Resource Strain: Low Risk    Difficulty of Paying Living Expenses: Not hard at all  Food Insecurity: No Food Insecurity   Worried About Charity fundraiser in the Last Year: Never true   [place] in the Last Year: Never true  Transportation Needs: No Transportation Needs   Lack of Transportation (Medical): No   Lack of Transportation (Non-Medical): No  Physical Activity: Insufficiently Active   Days of Exercise per Week: 7 days   Minutes of Exercise per Session: 20 min  Stress: Stress Concern Present   Feeling of Stress : Rather much  Social Connections: Moderately Isolated   Frequency of Communication with Friends and Family: Never   Frequency of Social Gatherings with Friends and Family: More than three times a week   Attends Religious Services: Never   Marine scientist or  Organizations: No   Attends Archivist Meetings: Never   Marital Status: Living with partner    Tobacco Counseling Counseling given: Not Answered  Clinical Intake:  Pre-visit preparation completed: Yes  Pain : No/denies pain     BMI - recorded: 33.36 Nutritional Status: BMI > 30  Obese Nutritional Risks: None Diabetes: Yes CBG done?: No Did pt. bring in CBG monitor from home?: No  How often do you need to have someone help you when you read instructions, pamphlets, or other written materials from your doctor or pharmacy?: 1 - Never  Diabetic?No  Interpreter Needed?: No  Information entered by :: Charlott Rakes, LPN   Activities of Daily Living In your present state of health, do you have any difficulty performing the following activities: 04/18/2021  Hearing? N  Vision? N  Difficulty concentrating or making decisions? Y  Walking or climbing stairs? N  Dressing or bathing? N  Doing errands, shopping? N  Preparing Food and eating ? N  Using the Toilet? N  In the past six months, have you accidently leaked urine? Y  Comment wears a pad at times  Do you have problems with loss of bowel control? N  Managing your Medications? N  Managing your Finances? N  Housekeeping or managing your Housekeeping? N  Some recent data might be hidden    Patient Care Team: Tammi Sou, MD as PCP - Charlett Lango, MD as Consulting Physician (Neurosurgery)  Indicate any recent Medical Services you may have received from other than [facility] providers in the past year (date may be approximate).     Assessment:   This is a routine wellness examination for Diana [name].  Hearing/Vision screen Hearing Screening - Comments:: Pt denies any hearing  Vision Screening - Comments:: Encouraged to follow up with eye exams  Dietary issues and exercise activities discussed: Current Exercise Habits: Home exercise routine (wlking dog daily), Type of exercise: walking, Time  (Minutes): 20, Frequency (Times/Week): 7, Weekly Exercise (Minutes/Week): 140   Goals Addressed             This Visit's Progress    Patient Stated       Get blood sugar lowered and lose weight        Depression Screen PHQ 2/9 Scores 04/18/2021 06/27/2020 03/29/2020 03/29/2020 10/11/2016  PHQ - 2 Score 4 - 3 1 2   PHQ- 9 Score 10 - 20 - 14  Exception Documentation - Medical reason - - -    Fall Risk Fall Risk  04/18/2021 03/29/2020 10/11/2016  Falls in the past year? 1 0 Yes  Comment - - "passed out"  Number falls in past yr: 1 0 2 or more  Injury with Fall? 1 0 Yes  Comment bumps and bruises - -  Risk for fall due to : History of fall(s);Impaired balance/gait;Impaired mobility - -  Follow up Falls prevention discussed Falls prevention discussed Falls prevention discussed    FALL RISK PREVENTION PERTAINING TO THE HOME:  Any stairs in or around the home? Yes  If so, are there any without handrails? No  Home free of loose throw rugs in walkways, pet beds, electrical cords, etc? Yes  Adequate lighting in your home to reduce risk of falls? Yes   ASSISTIVE DEVICES UTILIZED TO PREVENT FALLS:  Life alert? No  Use of a cane, walker or w/c? No  Grab bars in the bathroom? Yes  Shower chair or bench in shower? Yes  Elevated toilet seat or a handicapped toilet? No   TIMED UP AND GO:  Was the test performed? No .   Cognitive Function:     6CIT Screen 04/18/2021  What Year? 0 points  What month? 0 points  What time? 3 points  Count back from 20 0 points  Months in reverse 0 points  Repeat phrase 2 points  Total Score 5    Immunizations Immunization History  Administered Date(s) Administered   Influenza Split 11/02/2010   Influenza Whole 02/11/2010   Influenza,inj,Quad PF,6+ Mos 11/19/2012, 10/20/2014, 11/10/2015, 01/01/2017   Influenza,inj,quad, With Preservative 01/16/2018   Janssen (J&J) SARS-COV-2 Vaccination 05/05/2019, 12/29/2019   Pneumococcal Polysaccharide-23  11/10/2015   Td 02/12/2003   Tdap 06/16/2013   Zoster Recombinat (Shingrix) 06/15/2019, 09/11/2019    TDAP status: Up to date  Flu Vaccine status: Due, Education has been provided regarding the importance of this vaccine. Advised may receive this vaccine at local pharmacy or Health Dept. Aware to provide a copy of the vaccination record if obtained from local pharmacy or Health Dept. Verbalized acceptance and understanding.    Covid-19 vaccine status: Completed vaccines  Qualifies for Shingles Vaccine? Yes   Zostavax completed Yes   Shingrix Completed?: Yes  Screening Tests Health Maintenance  Topic Date Due   OPHTHALMOLOGY EXAM  12/19/2013   COVID-19 Vaccine (3 - Booster for Janssen series) 02/23/2020   FOOT EXAM  06/14/2020   URINE MICROALBUMIN  06/14/2020   INFLUENZA VACCINE  09/11/2020   HEMOGLOBIN A1C  01/03/2021   MAMMOGRAM  09/14/2021   TETANUS/TDAP  06/17/2023   COLONOSCOPY (Pts 45-29yr Insurance coverage will need to be confirmed)  02/12/2025   Hepatitis C Screening  Completed   HIV Screening  Completed   Zoster Vaccines- Shingrix  Completed   HPV VACCINES  Aged Out    Health Maintenance  Health Maintenance Due  Topic Date Due   OPHTHALMOLOGY EXAM  12/19/2013   COVID-19 Vaccine (3 - Booster for Janssen series) 02/23/2020   FOOT EXAM  06/14/2020   URINE MICROALBUMIN  06/14/2020   INFLUENZA VACCINE  09/11/2020   HEMOGLOBIN A1C  01/03/2021    Colorectal cancer screening: Type of screening: Colonoscopy. Completed 02/13/15. Repeat every 10 years  Mammogram status: Completed 09/14/20. Repeat every year     Additional Screening:  Hepatitis C Screening:  Completed 07/09/17  Vision Screening: Recommended annual ophthalmology exams for early detection of glaucoma and other disorders of the eye. Is the patient up to date with their annual eye exam?  No  Who is the provider or what is the name of the office in which the patient attends annual eye exams?  Encouraged to folow up If pt is not established with a provider, would they like to be referred to a provider to establish care? Yes .   Dental Screening: Recommended annual dental exams for proper oral hygiene  Community Resource Referral / Chronic Care Management: CRR required this visit?  No   CCM required this visit?  No      Plan:     I have personally reviewed and noted the following in the patients chart:   Medical and social history Use of alcohol, tobacco or illicit drugs  Current medications and supplements including opioid prescriptions.  Functional ability and status Nutritional status Physical activity Advanced directives List of other physicians Hospitalizations, surgeries, and ER visits in previous 12 months Vitals Screenings to include cognitive, depression, and falls Referrals and appointments  In addition, I have reviewed and discussed with patient certain preventive protocols, quality metrics, and best practice recommendations. A written personalized care plan for preventive services as well as general preventive health recommendations were provided to patient.  Willette Brace, LPN   09/17/1610   Nurse Notes: none

## 2021-05-02 ENCOUNTER — Other Ambulatory Visit: Payer: Self-pay | Admitting: "Endocrinology

## 2021-05-03 ENCOUNTER — Telehealth: Payer: Self-pay | Admitting: "Endocrinology

## 2021-05-03 NOTE — Telephone Encounter (Signed)
Pt requested refill on metformin. Patient has canceled every appt since May 2022. Patient called on 04/10/21 and wanted an appt. Patient made aware that if she did not keep appt and bring meter and logs she would be dismissed, per Dr Dorris Fetch. She said she has not been checking her sugar and that Dr Dorris Fetch would not be happy with her. Patient's metformin has been denied until she is seen. In December, she was given a limited supply by Rayetta Pigg, NP to hold her over until her January appt and she no showed the January appointment. No refills until she is seen with accurate readings ? ? ?Maudie Mercury, can you call this pt? ?

## 2021-05-03 NOTE — Telephone Encounter (Signed)
Pt notified that we cannot refill her metformin until she is seen by Dr Dorris Fetch and that she will be dismissed if she does not come to this appt. She states that she has started checked her BG and will bring those readings with her.  ?

## 2021-05-08 DIAGNOSIS — M62838 Other muscle spasm: Secondary | ICD-10-CM | POA: Diagnosis not present

## 2021-05-08 DIAGNOSIS — Z9889 Other specified postprocedural states: Secondary | ICD-10-CM | POA: Diagnosis not present

## 2021-05-08 DIAGNOSIS — G894 Chronic pain syndrome: Secondary | ICD-10-CM | POA: Diagnosis not present

## 2021-05-08 DIAGNOSIS — M5412 Radiculopathy, cervical region: Secondary | ICD-10-CM | POA: Diagnosis not present

## 2021-05-08 DIAGNOSIS — M533 Sacrococcygeal disorders, not elsewhere classified: Secondary | ICD-10-CM | POA: Diagnosis not present

## 2021-05-08 DIAGNOSIS — M5416 Radiculopathy, lumbar region: Secondary | ICD-10-CM | POA: Diagnosis not present

## 2021-05-09 ENCOUNTER — Ambulatory Visit (INDEPENDENT_AMBULATORY_CARE_PROVIDER_SITE_OTHER): Payer: No Typology Code available for payment source | Admitting: Family Medicine

## 2021-05-09 ENCOUNTER — Encounter: Payer: Self-pay | Admitting: "Endocrinology

## 2021-05-09 ENCOUNTER — Ambulatory Visit (INDEPENDENT_AMBULATORY_CARE_PROVIDER_SITE_OTHER): Payer: No Typology Code available for payment source | Admitting: "Endocrinology

## 2021-05-09 VITALS — BP 132/70 | HR 96 | Ht 62.0 in | Wt 174.0 lb

## 2021-05-09 VITALS — BP 150/88 | HR 83 | Temp 97.0°F | Ht 62.0 in | Wt 174.6 lb

## 2021-05-09 DIAGNOSIS — K760 Fatty (change of) liver, not elsewhere classified: Secondary | ICD-10-CM | POA: Diagnosis not present

## 2021-05-09 DIAGNOSIS — Z1211 Encounter for screening for malignant neoplasm of colon: Secondary | ICD-10-CM | POA: Diagnosis not present

## 2021-05-09 DIAGNOSIS — E119 Type 2 diabetes mellitus without complications: Secondary | ICD-10-CM

## 2021-05-09 DIAGNOSIS — F909 Attention-deficit hyperactivity disorder, unspecified type: Secondary | ICD-10-CM | POA: Diagnosis not present

## 2021-05-09 DIAGNOSIS — M65332 Trigger finger, left middle finger: Secondary | ICD-10-CM

## 2021-05-09 DIAGNOSIS — E7800 Pure hypercholesterolemia, unspecified: Secondary | ICD-10-CM

## 2021-05-09 DIAGNOSIS — F411 Generalized anxiety disorder: Secondary | ICD-10-CM | POA: Diagnosis not present

## 2021-05-09 DIAGNOSIS — Z79899 Other long term (current) drug therapy: Secondary | ICD-10-CM

## 2021-05-09 DIAGNOSIS — E782 Mixed hyperlipidemia: Secondary | ICD-10-CM

## 2021-05-09 DIAGNOSIS — F339 Major depressive disorder, recurrent, unspecified: Secondary | ICD-10-CM

## 2021-05-09 DIAGNOSIS — M65331 Trigger finger, right middle finger: Secondary | ICD-10-CM | POA: Diagnosis not present

## 2021-05-09 DIAGNOSIS — F4000 Agoraphobia, unspecified: Secondary | ICD-10-CM

## 2021-05-09 LAB — POCT GLYCOSYLATED HEMOGLOBIN (HGB A1C): HbA1c, POC (controlled diabetic range): 7.3 % — AB (ref 0.0–7.0)

## 2021-05-09 MED ORDER — MIRTAZAPINE 30 MG PO TABS: ORAL_TABLET | ORAL | 1 refills | Status: DC

## 2021-05-09 MED ORDER — TRIAMCINOLONE ACETONIDE 40 MG/ML IJ SUSP
40.0000 mg | Freq: Once | INTRAMUSCULAR | Status: AC
Start: 2021-05-09 — End: 2021-05-09
  Administered 2021-05-09: 40 mg via INTRA_ARTICULAR

## 2021-05-09 MED ORDER — LAMOTRIGINE 100 MG PO TABS: ORAL_TABLET | ORAL | 1 refills | Status: DC

## 2021-05-09 MED ORDER — DULOXETINE HCL 30 MG PO CPEP: ORAL_CAPSULE | ORAL | 1 refills | Status: DC

## 2021-05-09 MED ORDER — CLONAZEPAM 2 MG PO TABS: ORAL_TABLET | ORAL | 5 refills | Status: DC

## 2021-05-09 MED ORDER — FREESTYLE PRECISION NEO TEST VI STRP: ORAL_STRIP | 2 refills | Status: DC

## 2021-05-09 NOTE — Progress Notes (Signed)
OFFICE VISIT ? ?05/09/2021 ? ?CC:  ?Chief Complaint  ?Patient presents with  ? Anxiety  ? ADD  ? ?Patient is a 66 y.o. female who presents accompanied by her husband for f/u recurrent MDD, GAD/agoraphobia, adult ADD, hx of roux en Y gastric bipass, and NAFLD. ?I last saw her 06/27/20. ?A/P as of that visit: ?"1) Recurrent MDD, GAD/agoraphobia, adult ADD->all stable. ?Cont adderall 47m bid (CSC updated today and 90d supply eRxd.  UDS UTD, will repeat at next f/u in 37mo  Cont clonaz 32m46mid (no new rx for this today).  Remeron 30 qhs and cymbalta 30 qd and lamictal 100m73m all RF'd today. ?  ?2) Hx of roux en Y bipass surg, hx of B12 and iron def.  Cont home vit B12 inj 1000 mcg q2 wks and PO iron. ?Check B12 and iron levels today. ?  ?3) Hyperlipidemia, tolerating atorva 20mg35m ?FLP and hepatic panel today. ?  ?4) NAFLD: cirrhotic changes on imaging in the past. ?AFP level normal 3 mo ago.  Ordered liver u/s but pt wasn't contacted. ?Will look into this." ? ?INTERIM HX: ?Anxiety still a big problem, mood dips often.  Overall feels stable, though. ?She generally does not go out of the house except her doctors visits. ? ?She did not go for the right upper quadrant ultrasound that I ordered at last visit. ? ?Has chronic pain disorder--followed by Dr. EichmDavy Piqueggest issue is neck pain with radiculopathy, but also has significant bilateral hand/fingers osteoarthritis. ?Complains of chronic pain at the volar aspect of MCP joints of the third digit bilaterally.  Feels painful triggering in these areas regularly. ?Recalls having her left thumb injected, possibly first MCP joint or CMC joint--in the remote past by her pain specialist.  She says it helped significantly.  Says this was at least 6 months ago. ?She does not recall ever getting trigger finger ejections. ? ?ADD: Pt states all is going well with the med at current dosing: much improved focus, concentration, task completion.  Less frustration, better  multitasking, less impulsivity and restlessness.  Mood is stable. ?No side effects from the medication. ?She notes supply/ensure problems with Adderall lately. ?PMP AWARE reviewed today: most recent rx for adderall was filled 04/06/21, # 60, r69by me. ?Most recent clonaz rx filled 04/17/21, #90, rx by me. ?Her pain meds are rx'd by Dr. Dave Lenord Carbored flags. ? ?ROS as above, plus--> no fevers, no CP, no SOB, no wheezing, no cough, no dizziness, no HAs, no rashes, no melena/hematochezia.  No polyuria or polydipsia. No focal weakness and no tremors.  No acute vision or hearing abnormalities.  No dysuria or unusual/new urinary urgency or frequency.  No recent changes in lower legs. ?No n/v/d or abd pain.  No palpitations.   ? ?Past Medical History:  ?Diagnosis Date  ? Abnormal liver diagnostic imaging 05/20/2017  ? CT abd->cirrhosis with atypical lesions that MRI f/u determined to be focal regions of severe steatosis.  ? Abnormal mammogram 12/2014  ? F/u diagnostic mammo and u/s NORMAL--resume annual screening mammography  ? Arthritis   ? Bipolar disorder (HCC) [place] questionable  ? Cervical radiculopathy   ? Post cerv fusion C4-5, C5-6--stable as of 06/2019 NS f/u.  ? Chronic fatigue   ? Chronic nausea 2012/13  ? Normal upper GI  12/2011 (mild GERD noted +small hiatal hernia)+  ? Chronic pain syndrome   ? Chronic C spine pain with radiculopathy.  [facility] and he [facility] b/c she failed a UDS (+marijuana) 04/27/15. Dr. Trenton Gammon referred her to pain mgmt 2021.  ? Chronic renal insufficiency, stage II (mild) 02/2015  ? GFR 70s  ? Colon cancer screening 03/06/2011  ? iFob 11/2010 through her insurer was negative. iFOB 01/2015 NEG.  ? Depression   ? Diabetes mellitus   ? urine protein-Cr ratio nl 09/2010, D.R. Screen neg 10/2009  ? Fibromyalgia   ? Fracture of rib of left side 08/14/2015  ? 5th and 6th  ? Hepatic steatosis 2005  ? ultrasound.  Also, 07/2017 MRI  abd--> plus a few focal areas of more severe steatosis.  ? History of kidney stones   ? 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 Hyperlipidemia, mixed 2017; 03/2018  ? Recommended statin 03/2018  ? Insomnia   ? Iron deficiency anemia 09/2009  ? malabsorbtion s/p bariatric surgery  ? NAFLD (nonalcoholic fatty liver disease)   ? NAFLD->CT 2019 showed cirrhotic changes and f/u MRI showed benign areas of signif increased/severe focal steatosis.  Spleen normal 2019.  ? Nephrolithiasis 11/2012  ? Neurosis, anxiety, panic type   ? Obesity   ? s/p bariatric surgery  ? Osteopenia 12/2010  ? 2012 Hip T score -2.  09/2020 T score -2.2  ? Other B-complex deficiencies   ? Panic attacks   ? Restless legs syndrome   ? Syncopal episodes   ? 7/73/17 in the setting of dehydration and sedative medication  ? Tobacco dependence   ? Vitamin B12 deficiency   ? Vitamin D deficiency 09/2009  ? Wears dentures   ? uppers and lowers  ? ? ?Past Surgical History:  ?Procedure Laterality Date  ? ABDOMINAL HYSTERECTOMY    ? fibroids, ovaries remain  ? APPENDECTOMY    ? BACK SURGERY    ? BLADDER SURGERY    ? bladder tack  ? CERVICAL SPINE SURGERY  09/2009; 4315;4008  ? 2014-ant cerv decomp (Dr. Carloyn Manner). 2018 fusion (Dr. Annette Stable)  ? CHOLECYSTECTOMY  04/2010  ? w/lysis of adhesions (open procedure)--path showed chronic cholecystitis and cholesterol polyp.  ? CT CERVICAL SPINE W CONTRAST  ([place] [facility])  06/2019  ? No evidence of hardware complication or loosening of her instrumentation.  ? CYSTOSCOPY/RETROGRADE/URETEROSCOPY/STONE EXTRACTION WITH BASKET Left 11/21/2012  ? Left UVJ stone.  Procedure: CYSTOSCOPY/RETROGRADE/URETEROSCOPY/STONE EXTRACTION WITH BASKET insertion double j stent;  Surgeon: Ailene Rud, MD;  Location: [facility];  Service: Urology;  Laterality: Left;  ? DEXA  09/2020  ? T score -2.2. Rpt 2 yrs.  ? EPIDURAL BLOCK INJECTION  03/2011  ? Cervical  ? HERNIA REPAIR  2005  ? mesenteric hernia repair, with lysis of adhesions (presented  with SBO)  ? POSTERIOR CERVICAL FUSION/FORAMINOTOMY N/A 12/31/2016  ? Procedure: Posterior Cervical Fusion with lateral mass fixation - Cervical four-five, Cervical five-six;  Surgeon: Earnie Larsson, MD;  Location: [place];  Service: Neurosurgery;  Laterality: N/A;  ? ROUX-EN-Y GASTRIC BYPASS    ? TONSILLECTOMY    ? ? ?Outpatient Medications Prior to Visit  ?Medication Sig Dispense Refill  ? amphetamine-dextroamphetamine (ADDERALL) 10 MG tablet TAKE (1) TABLET BY MOUTH TWICE DAILY. 60 tablet 0  ? atorvastatin (LIPITOR) 20 MG tablet TAKE (1) TABLET BY MOUTH ONCE DAILY. 90 tablet 3  ? calcium elemental as carbonate (TUMS ULTRA 1000) 400 MG chewable tablet Chew 1,000 mg by mouth daily.    ? Cholecalciferol 2000 UNITS CAPS Take 1 capsule (2,000  Units total) by mouth daily. (Patient taking differently: Take 1 capsule by mouth daily. VIT D) 30 each 11  ? Continuous Blood Gluc Receiver (FREESTYLE LIBRE 2 READER) DEVI As directed 1 each 0  ? Continuous Blood Gluc Sensor (FREESTYLE LIBRE 2 SENSOR) MISC 1 Piece by Does not apply route every 14 (fourteen) days. 2 each 3  ? cyanocobalamin (,VITAMIN B-12,) 1000 MCG/ML injection INJECT 1ML INTO THE MUSCLE EVERY 14 DAYS. 10 mL 1  ? cyclobenzaprine (FLEXERIL) 5 MG tablet Take 5 mg by mouth 2 (two) times daily.    ? FERROUS SULFATE PO Take by mouth.    ? gabapentin (NEURONTIN) 300 MG capsule TAKE 4 CAPSULES BY MOUTH AT BEDTIME. 360 capsule 3  ? glucose blood (FREESTYLE PRECISION NEO TEST) test strip Use as instructed 100 each 2  ? insulin aspart (NOVOLOG FLEXPEN) 100 UNIT/ML FlexPen Inject 10 Units into the skin 3 (three) times daily before meals. 15 mL 0  ? insulin detemir (LEVEMIR FLEXTOUCH) 100 UNIT/ML FlexPen Inject 80 Units into the skin at bedtime. (Patient taking differently: Inject 71 Units into the skin at bedtime.) 15 mL 0  ? Insulin Pen Needle (B-D ULTRAFINE III SHORT PEN) 31G X 8 MM MISC USE AS DIRECTED TO INJECT INSULIN UP TO 5 TIMES DAILY. 30 each 0  ? metFORMIN  (GLUCOPHAGE-XR) 500 MG 24 hr tablet TAKE (2) TABLET BY MOUTH ONCE DAILY WITH BREAKFAST. 60 tablet 0  ? ondansetron (ZOFRAN) 4 MG tablet TAKE 1 TABLET BY MOUTH EVERY 6 HOURS AS NEEDED FOR NAUSEA. 30 tablet 1  ? oxyCODONE-ace

## 2021-05-09 NOTE — Patient Instructions (Signed)

## 2021-05-09 NOTE — Progress Notes (Signed)
? ?                                                  ?     05/09/2021, 3:51 PM ? ?Endocrinology follow-up note ? ? ?Subjective:  ? ? Patient ID: Diana [name], female    DOB: 09-26-1955.  ?Diana [name] is being seen in follow-up after she was seen in consultation for management of currently uncontrolled symptomatic diabetes requested by  Tammi Sou, MD. ? ? ?Past Medical History:  ?Diagnosis Date  ? Abnormal liver diagnostic imaging 05/20/2017  ? CT abd->cirrhosis with atypical lesions that MRI f/u determined to be focal regions of severe steatosis.  ? Abnormal mammogram 12/2014  ? F/u diagnostic mammo and u/s NORMAL--resume annual screening mammography  ? Arthritis   ? Bipolar disorder ([place])   ? questionable  ? Cervical radiculopathy   ? Post cerv fusion C4-5, C5-6--stable as of 06/2019 NS f/u.  ? Chronic fatigue   ? Chronic nausea 2012/13  ? Normal upper GI  12/2011 (mild GERD noted +small hiatal hernia)+  ? Chronic pain syndrome   ? Chronic C spine pain with radiculopathy.  Dr. Francesco Runner at [facility] was her pain mgmt MD and he discharged her from [facility] pain mgmt center b/c she failed a UDS (+marijuana) 04/27/15. Dr. Trenton Gammon referred her to pain mgmt 2021.  ? Chronic renal insufficiency, stage II (mild) 02/2015  ? GFR 70s  ? Colon cancer screening 03/06/2011  ? iFob 11/2010 through her insurer was negative. iFOB 01/2015 NEG.  ? Depression   ? Diabetes mellitus   ? urine protein-Cr ratio nl 09/2010, D.R. Screen neg 10/2009  ? Fibromyalgia   ? Fracture of rib of left side 08/14/2015  ? 5th and 6th  ? Hepatic steatosis 2005  ? ultrasound.  Also, 07/2017 MRI abd--> plus a few focal areas of more severe steatosis.  ? History of kidney stones   ? 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 Hyperlipidemia, mixed 2017; 03/2018  ? Recommended statin 03/2018  ? Insomnia   ? Iron deficiency anemia 09/2009  ? malabsorbtion s/p bariatric surgery  ? NAFLD (nonalcoholic fatty liver disease)   ?  NAFLD->CT 2019 showed cirrhotic changes and f/u MRI showed benign areas of signif increased/severe focal steatosis.  Spleen normal 2019.  ? Nephrolithiasis 11/2012  ? Neurosis, anxiety, panic type   ? Obesity   ? s/p bariatric surgery  ? Osteopenia 12/2010  ? 2012 Hip T score -2.  09/2020 T score -2.2  ? Other B-complex deficiencies   ? Panic attacks   ? Restless legs syndrome   ? Syncopal episodes   ? 7/73/17 in the setting of dehydration and sedative medication  ? Tobacco dependence   ? Vitamin B12 deficiency   ? Vitamin D deficiency 09/2009  ? Wears dentures   ? uppers and lowers  ? ? ?Past Surgical History:  ?Procedure Laterality Date  ? ABDOMINAL HYSTERECTOMY    ? fibroids, ovaries remain  ? APPENDECTOMY    ? BACK SURGERY    ? BLADDER SURGERY    ? bladder tack  ? CERVICAL SPINE SURGERY  09/2009; 8469;6295  ? 2014-ant cerv decomp (Dr. Carloyn Manner). 2018 fusion (Dr. Annette Stable)  ? CHOLECYSTECTOMY  04/2010  ? w/lysis of adhesions (open procedure)--path showed chronic cholecystitis and cholesterol polyp.  ?  CT CERVICAL SPINE W CONTRAST  ([place] [facility])  06/2019  ? No evidence of hardware complication or loosening of her instrumentation.  ? CYSTOSCOPY/RETROGRADE/URETEROSCOPY/STONE EXTRACTION WITH BASKET Left 11/21/2012  ? Left UVJ stone.  Procedure: CYSTOSCOPY/RETROGRADE/URETEROSCOPY/STONE EXTRACTION WITH BASKET insertion double j stent;  Surgeon: Ailene Rud, MD;  [facility];  Service: Urology;  Laterality: Left;  ? DEXA  09/2020  ? T score -2.2. Rpt 2 yrs.  ? EPIDURAL BLOCK INJECTION  03/2011  ? Cervical  ? HERNIA REPAIR  2005  ? mesenteric hernia repair, with lysis of adhesions (presented with SBO)  ? POSTERIOR CERVICAL FUSION/FORAMINOTOMY N/A 12/31/2016  ? Procedure: Posterior Cervical Fusion with lateral mass fixation - Cervical four-five, Cervical five-six;  Surgeon: Earnie Larsson, MD;  Location: [place];  Service: Neurosurgery;  Laterality: N/A;  ? ROUX-EN-Y GASTRIC BYPASS    ? TONSILLECTOMY    ? ? ?Social History   ? ?Socioeconomic History  ? Marital status: Significant Other  ?  Spouse name: Not on file  ? Number of children: Not on file  ? Years of education: Not on file  ? Highest education level: Not on file  ?Occupational History  ? Not on file  ?Tobacco Use  ? Smoking status: Former  ?  Packs/day: 1.00  ?  Years: 1.00  ?  Pack years: 1.00  ?  Types: Cigarettes  ?  Quit date: 04/11/2016  ?  Years since quitting: 5.0  ? Smokeless tobacco: Never  ?Vaping Use  ? Vaping Use: Never used  ?Substance and Sexual Activity  ? Alcohol use: No  ? Drug use: No  ? Sexual activity: Not on file  ?Other Topics Concern  ? Not on file  ?Social History Narrative  ? Lives with longtime boyfriend in [place], [place].  ? Had a stepson but he committed suicide 2012/13.  ? Disabled due to anxiety.  ? Longtime smoker, quits frequently but restarts.  ? No alcohol or illegal drugs.  ? No exercise.  ? ?Social Determinants of Health  ? ?Financial Resource Strain: Low Risk   ? Difficulty of Paying Living Expenses: Not hard at all  ?Food Insecurity: No Food Insecurity  ? Worried About Charity fundraiser in the Last Year: Never true  ? Ran Out of Food in the Last Year: Never true  ?Transportation Needs: No Transportation Needs  ? Lack of Transportation (Medical): No  ? Lack of Transportation (Non-Medical): No  ?Physical Activity: Insufficiently Active  ? Days of Exercise per Week: 7 days  ? Minutes of Exercise per Session: 20 min  ?Stress: Stress Concern Present  ? Feeling of Stress : Rather much  ?Social Connections: Moderately Isolated  ? Frequency of Communication with Friends and Family: Never  ? Frequency of Social Gatherings with Friends and Family: More than three times a week  ? Attends Religious Services: Never  ? Active Member of Clubs or Organizations: No  ? Attends Archivist Meetings: Never  ? Marital Status: Living with partner  ? ? ?Family History  ?Problem Relation Age of Onset  ? Heart disease Mother   ? Cancer Mother   ?  Hypertension Mother   ? Hyperlipidemia Mother   ? Stroke Mother   ? Heart disease Father   ? Hypertension Father   ? Hyperlipidemia Father   ? Cancer Sister   ? Heart disease Sister   ? Alzheimer's disease Brother   ? Kidney disease Brother   ? ? ?Outpatient Encounter Medications as  of 05/09/2021  ?Medication Sig  ? glucose blood (FREESTYLE PRECISION NEO TEST) test strip Use as instructed  ? amphetamine-dextroamphetamine (ADDERALL) 10 MG tablet TAKE (1) TABLET BY MOUTH TWICE DAILY.  ? atorvastatin (LIPITOR) 20 MG tablet TAKE (1) TABLET BY MOUTH ONCE DAILY.  ? calcium elemental as carbonate (TUMS ULTRA 1000) 400 MG chewable tablet Chew 1,000 mg by mouth daily.  ? Cholecalciferol 2000 UNITS CAPS Take 1 capsule (2,000 Units total) by mouth daily. (Patient taking differently: Take 1 capsule by mouth daily. VIT D)  ? clonazePAM (KLONOPIN) 2 MG tablet TAKE (1) TABLET BY MOUTH (3) TIMES DAILY.  ? Continuous Blood Gluc Receiver (FREESTYLE LIBRE 2 READER) DEVI As directed  ? Continuous Blood Gluc Sensor (FREESTYLE LIBRE 2 SENSOR) MISC 1 Piece by Does not apply route every 14 (fourteen) days.  ? cyanocobalamin (,VITAMIN B-12,) 1000 MCG/ML injection INJECT 1ML INTO THE MUSCLE EVERY 14 DAYS.  ? cyclobenzaprine (FLEXERIL) 5 MG tablet Take 5 mg by mouth 2 (two) times daily.  ? DULoxetine (CYMBALTA) 30 MG capsule TAKE (1) CAPSULE BY MOUTH ONCE DAILY.  ? FERROUS SULFATE PO Take by mouth.  ? gabapentin (NEURONTIN) 300 MG capsule TAKE 4 CAPSULES BY MOUTH AT BEDTIME.  ? insulin aspart (NOVOLOG FLEXPEN) 100 UNIT/ML FlexPen Inject 10 Units into the skin 3 (three) times daily before meals.  ? insulin detemir (LEVEMIR FLEXTOUCH) 100 UNIT/ML FlexPen Inject 80 Units into the skin at bedtime. (Patient taking differently: Inject 71 Units into the skin at bedtime.)  ? Insulin Pen Needle (B-D ULTRAFINE III SHORT PEN) 31G X 8 MM MISC USE AS DIRECTED TO INJECT INSULIN UP TO 5 TIMES DAILY.  ? lamoTRIgine (LAMICTAL) 100 MG tablet 1 tab po qd  ?  metFORMIN (GLUCOPHAGE-XR) 500 MG 24 hr tablet TAKE (2) TABLET BY MOUTH ONCE DAILY WITH BREAKFAST.  ? mirtazapine (REMERON) 30 MG tablet TAKE (1) TABLET BY MOUTH AT BEDTIME.  ? ondansetron (ZOFRAN) 4 MG tablet TAKE 1 TA

## 2021-05-10 LAB — LIPID PANEL
Cholesterol: 161 mg/dL (ref 0–200)
HDL: 45 mg/dL (ref >39.00)
LDL Cholesterol: 93 mg/dL (ref 0–99)
NonHDL: 115.77
Total CHOL/HDL Ratio: 4
Triglycerides: 116 mg/dL (ref 0.0–149.0)
VLDL: 23.2 mg/dL (ref 0.0–40.0)

## 2021-05-10 LAB — PROTIME-INR
INR: 1 ratio (ref 0.8–1.0)
Prothrombin Time: 10.7 s (ref 9.6–13.1)

## 2021-05-10 LAB — COMPREHENSIVE METABOLIC PANEL
ALT: 26 U/L (ref 0–35)
AST: 33 U/L (ref 0–37)
Albumin: 4.3 g/dL (ref 3.5–5.2)
Alkaline Phosphatase: 81 U/L (ref 39–117)
BUN: 12 mg/dL (ref 6–23)
CO2: 29 mEq/L (ref 19–32)
Calcium: 9.6 mg/dL (ref 8.4–10.5)
Chloride: 103 mEq/L (ref 96–112)
Creatinine, Ser: 0.81 mg/dL (ref 0.40–1.20)
GFR: 76.76 mL/min (ref >60.00)
Glucose, Bld: 94 mg/dL (ref 70–99)
Potassium: 4.5 mEq/L (ref 3.5–5.1)
Sodium: 139 mEq/L (ref 135–145)
Total Bilirubin: 0.6 mg/dL (ref 0.2–1.2)
Total Protein: 7.4 g/dL (ref 6.0–8.3)

## 2021-05-11 LAB — DRUG MONITORING PANEL 376104, URINE

## 2021-05-11 LAB — DM TEMPLATE

## 2021-05-11 LAB — AFP TUMOR MARKER: AFP-Tumor Marker: 1.9 ng/mL

## 2021-05-15 ENCOUNTER — Other Ambulatory Visit: Payer: Self-pay | Admitting: "Endocrinology

## 2021-05-15 DIAGNOSIS — E119 Type 2 diabetes mellitus without complications: Secondary | ICD-10-CM

## 2021-05-21 ENCOUNTER — Ambulatory Visit (HOSPITAL_COMMUNITY)
Admission: RE | Admit: 2021-05-21 | Discharge: 2021-05-21 | Disposition: A | Discharge status: Home / Self Care | Payer: No Typology Code available for payment source | Source: Ambulatory Visit | Attending: Family Medicine | Admitting: Family Medicine

## 2021-05-21 ENCOUNTER — Encounter: Payer: Self-pay | Admitting: Family Medicine

## 2021-05-21 DIAGNOSIS — K760 Fatty (change of) liver, not elsewhere classified: Secondary | ICD-10-CM | POA: Insufficient documentation

## 2021-05-21 DIAGNOSIS — Z9049 Acquired absence of other specified parts of digestive tract: Secondary | ICD-10-CM | POA: Diagnosis not present

## 2021-05-21 DIAGNOSIS — K838 Other specified diseases of biliary tract: Secondary | ICD-10-CM | POA: Diagnosis not present

## 2021-06-06 ENCOUNTER — Other Ambulatory Visit: Payer: Self-pay | Admitting: Family Medicine

## 2021-06-06 ENCOUNTER — Other Ambulatory Visit: Payer: Self-pay | Admitting: "Endocrinology

## 2021-06-06 DIAGNOSIS — Z79899 Other long term (current) drug therapy: Secondary | ICD-10-CM

## 2021-06-06 DIAGNOSIS — F909 Attention-deficit hyperactivity disorder, unspecified type: Secondary | ICD-10-CM

## 2021-06-06 DIAGNOSIS — E119 Type 2 diabetes mellitus without complications: Secondary | ICD-10-CM

## 2021-06-07 NOTE — Telephone Encounter (Signed)
Requesting: adderall ?Contract: 06/27/20 ?UDS: 03/29/20 ?Last Visit: 05/09/21 ?Next Visit: advised to f/u 6 mo. ?Last Refill: 04/06/21(60,0) ? ?Please Advise. Med pending. ? ?Tizanidine was d/c on 05/09/21 ? ? ? ? ?

## 2021-07-05 ENCOUNTER — Other Ambulatory Visit: Payer: Self-pay | Admitting: Family Medicine

## 2021-07-05 ENCOUNTER — Other Ambulatory Visit: Payer: Self-pay | Admitting: "Endocrinology

## 2021-07-05 DIAGNOSIS — E119 Type 2 diabetes mellitus without complications: Secondary | ICD-10-CM

## 2021-07-19 ENCOUNTER — Telehealth: Payer: Self-pay

## 2021-07-19 NOTE — Telephone Encounter (Signed)
LVM for pt to CB regarding test ordered.  Note: Want to confirm if pt received cologuard kit and to encourage to complete.

## 2021-08-09 ENCOUNTER — Ambulatory Visit: Payer: No Typology Code available for payment source | Admitting: "Endocrinology

## 2021-09-20 ENCOUNTER — Other Ambulatory Visit: Payer: Self-pay | Admitting: "Endocrinology

## 2021-09-20 ENCOUNTER — Other Ambulatory Visit: Payer: Self-pay

## 2021-09-20 DIAGNOSIS — F909 Attention-deficit hyperactivity disorder, unspecified type: Secondary | ICD-10-CM

## 2021-09-20 DIAGNOSIS — Z79899 Other long term (current) drug therapy: Secondary | ICD-10-CM

## 2021-09-20 DIAGNOSIS — E119 Type 2 diabetes mellitus without complications: Secondary | ICD-10-CM

## 2021-09-20 NOTE — Telephone Encounter (Signed)
Patient called to step up follow up appt with Dr. Anitra Lauth.  Per his notes, appt can be virtual ; lab orders were given to patient so she can take request to [employer] a week before her appt with provider. Please call patient on cell  Requesting refills - Dr. Anitra Lauth will be managing diabetes medication.  [place]  LEVEMIR FLEXPEN 100 UNIT/ML FlexPen [[id number]]   insulin aspart (NOVOLOG FLEXPEN) 100 UNIT/ML FlexPen [[id number]]   metFORMIN (GLUCOPHAGE-XR) 500 MG 24 hr tablet [[id number]]   Flexiril ( patient d/c Tizandine)  I could not find medication list

## 2021-09-21 ENCOUNTER — Other Ambulatory Visit: Payer: Self-pay | Admitting: Family Medicine

## 2021-09-21 DIAGNOSIS — Z79899 Other long term (current) drug therapy: Secondary | ICD-10-CM

## 2021-09-21 DIAGNOSIS — F909 Attention-deficit hyperactivity disorder, unspecified type: Secondary | ICD-10-CM

## 2021-09-21 NOTE — Telephone Encounter (Signed)
Request sent to provider for review

## 2021-09-21 NOTE — Telephone Encounter (Addendum)
Pt will not be seeing Dr.Nida any longer, not satisfied with care. She needs refill for metformin and Adderall currently.

## 2021-09-21 NOTE — Telephone Encounter (Signed)
Patient has been seeing endocrinologist, Dr.Nida, most recently on 05/09/2021.  He has been prescribing/managing her diabetes medications. Is she not going to follow-up with him anymore?

## 2021-09-21 NOTE — Telephone Encounter (Signed)
LM for pt to returncall

## 2021-09-21 NOTE — Telephone Encounter (Signed)
Patient returning call about lab results.  Please call patient back when available.

## 2021-09-21 NOTE — Telephone Encounter (Signed)
Please review and advise regaring lab orders and refills. Next scheduled appt is 11/16/21  Last rx for Levemir 09/20/21 (60m,0) Dr.Nida Novolog 09/20/21 (118m0) Dr.Nida Metformin 07/05/21 (180,0) Dr.Nida Flexeril last written 05/09/21 but d/c on 06/07/21.

## 2021-09-24 ENCOUNTER — Other Ambulatory Visit: Payer: Self-pay

## 2021-09-24 MED ORDER — AMPHETAMINE-DEXTROAMPHETAMINE 10 MG PO TABS: ORAL_TABLET | ORAL | 0 refills | Status: DC

## 2021-09-24 MED ORDER — ATORVASTATIN CALCIUM 20 MG PO TABS: ORAL_TABLET | ORAL | 1 refills | Status: DC

## 2021-09-24 MED ORDER — METFORMIN HCL ER 500 MG PO TB24
ORAL_TABLET | ORAL | 0 refills | Status: DC
Start: 2021-09-24 — End: 2021-11-29

## 2021-09-24 NOTE — Telephone Encounter (Signed)
Please deny RF. Duplicate request

## 2021-09-24 NOTE — Telephone Encounter (Signed)
Pt advised refills sent. Per provider, if pt wants him to start managing diabetes this will require more frequent visits. Pt understood and is ok with this. She has written lab orders from [facility] and will complete 1 week before appt with Dr.McGowen and request for [place] to receive results.

## 2021-09-26 ENCOUNTER — Other Ambulatory Visit: Payer: Self-pay

## 2021-10-08 ENCOUNTER — Other Ambulatory Visit: Payer: Self-pay | Admitting: Family Medicine

## 2021-11-01 ENCOUNTER — Other Ambulatory Visit: Payer: Self-pay | Admitting: Family Medicine

## 2021-11-01 DIAGNOSIS — Z1231 Encounter for screening mammogram for malignant neoplasm of breast: Secondary | ICD-10-CM

## 2021-11-02 ENCOUNTER — Other Ambulatory Visit: Payer: Self-pay | Admitting: "Endocrinology

## 2021-11-02 ENCOUNTER — Other Ambulatory Visit: Payer: Self-pay | Admitting: Family Medicine

## 2021-11-02 DIAGNOSIS — Z79899 Other long term (current) drug therapy: Secondary | ICD-10-CM

## 2021-11-02 DIAGNOSIS — F909 Attention-deficit hyperactivity disorder, unspecified type: Secondary | ICD-10-CM

## 2021-11-09 ENCOUNTER — Telehealth: Payer: No Typology Code available for payment source | Admitting: Family Medicine

## 2021-11-13 NOTE — Progress Notes (Signed)
[facility] Quality Team Note  Name: Diana [name] Date of Birth: 06/27/1955 MRN: [id number] Date: 11/13/2021  [facility] Quality Team has reviewed this patient's chart, please see recommendations below:  [facility] Quality Other; (KED: Kidney Health Evaluation Gap- Patient needs Urine Albumin Creatinine Ratio Test completed for gap closure. EGFR has already been completed, Patient has upcoming appointment with [facility] 11/30/2021.) 

## 2021-11-16 ENCOUNTER — Telehealth: Payer: No Typology Code available for payment source | Admitting: Family Medicine

## 2021-11-30 ENCOUNTER — Ambulatory Visit
Admission: RE | Admit: 2021-11-30 | Discharge: 2021-11-30 | Disposition: A | Discharge status: Home / Self Care | Payer: No Typology Code available for payment source | Source: Ambulatory Visit | Attending: Family Medicine | Admitting: Family Medicine

## 2021-11-30 ENCOUNTER — Ambulatory Visit (INDEPENDENT_AMBULATORY_CARE_PROVIDER_SITE_OTHER): Payer: No Typology Code available for payment source | Admitting: Family Medicine

## 2021-11-30 ENCOUNTER — Encounter: Payer: Self-pay | Admitting: Family Medicine

## 2021-11-30 VITALS — BP 143/79 | HR 93 | Temp 97.5°F | Ht 62.0 in | Wt 172.0 lb

## 2021-11-30 DIAGNOSIS — E538 Deficiency of other specified B group vitamins: Secondary | ICD-10-CM | POA: Diagnosis not present

## 2021-11-30 DIAGNOSIS — E119 Type 2 diabetes mellitus without complications: Secondary | ICD-10-CM | POA: Diagnosis not present

## 2021-11-30 DIAGNOSIS — Z23 Encounter for immunization: Secondary | ICD-10-CM | POA: Diagnosis not present

## 2021-11-30 DIAGNOSIS — E611 Iron deficiency: Secondary | ICD-10-CM | POA: Diagnosis not present

## 2021-11-30 DIAGNOSIS — K909 Intestinal malabsorption, unspecified: Secondary | ICD-10-CM | POA: Diagnosis not present

## 2021-11-30 DIAGNOSIS — F411 Generalized anxiety disorder: Secondary | ICD-10-CM | POA: Diagnosis not present

## 2021-11-30 DIAGNOSIS — Z79899 Other long term (current) drug therapy: Secondary | ICD-10-CM | POA: Diagnosis not present

## 2021-11-30 DIAGNOSIS — F909 Attention-deficit hyperactivity disorder, unspecified type: Secondary | ICD-10-CM | POA: Diagnosis not present

## 2021-11-30 DIAGNOSIS — Z1231 Encounter for screening mammogram for malignant neoplasm of breast: Secondary | ICD-10-CM

## 2021-11-30 DIAGNOSIS — E559 Vitamin D deficiency, unspecified: Secondary | ICD-10-CM | POA: Diagnosis not present

## 2021-11-30 LAB — CBC
HCT: 39.5 % (ref 36.0–46.0)
Hemoglobin: 12.9 g/dL (ref 12.0–15.0)
MCHC: 32.5 g/dL (ref 30.0–36.0)
MCV: 94.6 fl (ref 78.0–100.0)
Platelets: 273 10*3/uL (ref 150.0–400.0)
RBC: 4.18 Mil/uL (ref 3.87–5.11)
RDW: 12.9 % (ref 11.5–15.5)
WBC: 6.4 10*3/uL (ref 4.0–10.5)

## 2021-11-30 LAB — BASIC METABOLIC PANEL
BUN: 11 mg/dL (ref 6–23)
CO2: 33 mEq/L — ABNORMAL HIGH (ref 19–32)
Calcium: 10.6 mg/dL — ABNORMAL HIGH (ref 8.4–10.5)
Chloride: 97 mEq/L (ref 96–112)
Creatinine, Ser: 0.87 mg/dL (ref 0.40–1.20)
GFR: 70.17 mL/min (ref >60.00)
Glucose, Bld: 98 mg/dL (ref 70–99)
Potassium: 5.3 mEq/L — ABNORMAL HIGH (ref 3.5–5.1)
Sodium: 138 mEq/L (ref 135–145)

## 2021-11-30 LAB — MICROALBUMIN / CREATININE URINE RATIO
Creatinine,U: 36.8 mg/dL
Microalb Creat Ratio: 2.6 mg/g (ref 0.0–30.0)
Microalb, Ur: 1 mg/dL (ref 0.0–1.9)

## 2021-11-30 LAB — VITAMIN B12: Vitamin B-12: 912 pg/mL — ABNORMAL HIGH (ref 211–911)

## 2021-11-30 LAB — POCT GLYCOSYLATED HEMOGLOBIN (HGB A1C)
HbA1c POC (<> result, manual entry): 6.8 % (ref 4.0–5.6)
HbA1c, POC (controlled diabetic range): 6.8 % (ref 0.0–7.0)
HbA1c, POC (prediabetic range): 6.8 % — AB (ref 5.7–6.4)
Hemoglobin A1C: 6.8 % — AB (ref 4.0–5.6)

## 2021-11-30 LAB — VITAMIN D 25 HYDROXY (VIT D DEFICIENCY, FRACTURES): VITD: 56.21 ng/mL (ref 30.00–100.00)

## 2021-11-30 MED ORDER — GABAPENTIN 300 MG PO CAPS
ORAL_CAPSULE | ORAL | 0 refills | Status: DC
Start: 2021-11-30 — End: 2022-04-11

## 2021-11-30 MED ORDER — LAMOTRIGINE 100 MG PO TABS: ORAL_TABLET | ORAL | 1 refills | Status: DC

## 2021-11-30 MED ORDER — AMPHETAMINE-DEXTROAMPHETAMINE 10 MG PO TABS: ORAL_TABLET | ORAL | 0 refills | Status: DC

## 2021-11-30 MED ORDER — CLONAZEPAM 2 MG PO TABS: ORAL_TABLET | ORAL | 5 refills | Status: DC

## 2021-11-30 MED ORDER — METFORMIN HCL ER 500 MG PO TB24: ORAL_TABLET | ORAL | 0 refills | Status: DC

## 2021-11-30 MED ORDER — DULOXETINE HCL 30 MG PO CPEP
ORAL_CAPSULE | ORAL | 1 refills | Status: DC
Start: 2021-11-30 — End: 2022-06-21

## 2021-11-30 MED ORDER — MIRTAZAPINE 30 MG PO TABS: ORAL_TABLET | ORAL | 1 refills | Status: DC

## 2021-11-30 MED ORDER — BD PEN NEEDLE NANO U/F 32G X 4 MM MISC: 0 refills | Status: DC

## 2021-11-30 MED ORDER — CYANOCOBALAMIN 1000 MCG/ML IJ SOLN: INTRAMUSCULAR | 0 refills | Status: DC

## 2021-11-30 MED ORDER — NOVOLOG FLEXPEN 100 UNIT/ML ~~LOC~~ SOPN: PEN_INJECTOR | SUBCUTANEOUS | 0 refills | Status: DC

## 2021-11-30 MED ORDER — ATORVASTATIN CALCIUM 20 MG PO TABS: ORAL_TABLET | ORAL | 1 refills | Status: DC

## 2021-11-30 MED ORDER — FREESTYLE PRECISION NEO TEST VI STRP: ORAL_STRIP | 2 refills | Status: DC

## 2021-11-30 MED ORDER — FREESTYLE LIBRE 2 SENSOR MISC: 0 refills | Status: DC

## 2021-11-30 MED ORDER — PROPRANOLOL HCL 40 MG PO TABS: ORAL_TABLET | ORAL | 3 refills | Status: DC

## 2021-11-30 NOTE — Progress Notes (Unsigned)
OFFICE VISIT  12/03/2021  CC:  Chief Complaint  Patient presents with   Diabetes   Anxiety   ADHD    Patient is a 66 y.o. female who presents for 43-monthfollow-up diabetes, adult ADD, and chronic anxiety.  INTERIM HX: Doing okay except severe chronic anxiety and agoraphobia.  No home glucose monitoring.  She does take Levemir 70 units nightly and NovoLog 10 mg with meals.  Also metformin XR 1000 mg daily.  PMP AWARE reviewed today: most recent rx for Adderall was filled 11/02/2021, #60, rx by me. Most recent clonazepam prescription filled 11/02/2021, #90, prescription by me. Her opioids are managed by pain management MD. No red flags.  ROS as above, plus--> no fevers, no CP, no SOB, no wheezing, no cough, no dizziness, no HAs, no rashes, no melena/hematochezia.  No polyuria or polydipsia.  No myalgias or arthralgias.  No focal weakness, paresthesias, or tremors.  No acute vision or hearing abnormalities.  No dysuria or unusual/new urinary urgency or frequency.  No recent changes in lower legs. No n/v/d or abd pain.  No palpitations.    Past Medical History:  Diagnosis Date   Abnormal liver diagnostic imaging 05/20/2017   CT abd->cirrhosis with atypical lesions that MRI f/u determined to be focal regions of severe steatosis.   Abnormal mammogram 12/2014   F/u diagnostic mammo and u/s NORMAL--resume annual screening mammography   Arthritis    Bipolar disorder ([place]    questionable   Cervical radiculopathy    Post cerv fusion C4-5, C5-6--stable as of 06/2019 NS f/u.   Chronic fatigue    Chronic nausea 2012/13   Normal upper GI  12/2011 (mild GERD noted +small hiatal hernia)+   Chronic pain syndrome    Chronic C spine pain with radiculopathy.  [facility]c she failed a UDS (+marijuana) 04/27/15. Dr. PTrenton Gammonreferred her to pain mgmt 2021.   Chronic renal insufficiency, stage II (mild) 02/2015   GFR  70s   Colon cancer screening 03/06/2011   iFob 11/2010 through her insurer was negative. iFOB 01/2015 NEG.   Depression    Diabetes mellitus    urine protein-Cr ratio nl 09/2010, D.R. Screen neg 10/2009   Fibromyalgia    Fracture of rib of left side 08/14/2015   5th and 6th   Hepatic steatosis 2005   ultrasound.  Also, 07/2017 MRI abd--> plus a few focal areas of more severe steatosis.  Stable fatty liver on ultrasound 05/2021.   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  Spleen normal 2019.  Stable fatty liver on ultrasound 05/2021   Nephrolithiasis 11/2012   Neurosis, anxiety, panic type    Obesity    s/p bariatric surgery   Osteopenia 12/2010   2012 Hip T score -2.  09/2020 T score -2.2   Other B-complex deficiencies    Panic attacks    Restless legs syndrome    Syncopal episodes    7/73/17 in the setting of dehydration and sedative medication   Tobacco dependence    Vitamin B12 deficiency    Vitamin  D deficiency 09/2009   Wears dentures    uppers and lowers    Past Surgical History:  Procedure Laterality Date   ABDOMINAL HYSTERECTOMY     fibroids, ovaries remain   APPENDECTOMY     BACK SURGERY     BLADDER SURGERY     bladder tack   CERVICAL SPINE SURGERY  09/2009; 4431;5400   2014-ant cerv decomp (Dr. Carloyn Manner). 2018 fusion (Dr. Annette Stable)   [place]  04/2010   w/lysis of adhesions (open procedure)--path showed chronic cholecystitis and cholesterol polyp.   CT CERVICAL SPINE W CONTRAST  ([place] [facility])  06/2019   No evidence of hardware complication or loosening of her instrumentation.   CYSTOSCOPY/RETROGRADE/URETEROSCOPY/STONE EXTRACTION  WITH BASKET Left 11/21/2012   Left UVJ stone.  Procedure: CYSTOSCOPY/RETROGRADE/URETEROSCOPY/STONE EXTRACTION WITH BASKET insertion double j stent;  Surgeon: Ailene Rud, MD;  Location: [facility];  Service: Urology;  Laterality: Left;   DEXA  09/2020   T score -2.2. Rpt 2 yrs.   EPIDURAL BLOCK INJECTION  03/2011   Cervical   HERNIA REPAIR  2005   mesenteric hernia repair, with lysis of adhesions (presented with SBO)   POSTERIOR CERVICAL FUSION/FORAMINOTOMY N/A 12/31/2016   Procedure: Posterior Cervical Fusion with lateral mass fixation - Cervical four-five, Cervical five-six;  Surgeon: Earnie Larsson, MD;  Location: [facility];  Service: Neurosurgery;  Laterality: N/A;   ROUX-EN-Y GASTRIC BYPASS     TONSILLECTOMY      Outpatient Medications Prior to Visit  Medication Sig Dispense Refill   calcium elemental as carbonate (TUMS ULTRA 1000) 400 MG chewable tablet Chew 1,000 mg by mouth daily.     Cholecalciferol 2000 UNITS CAPS Take 1 capsule (2,000 Units total) by mouth daily. (Patient taking differently: Take 1 capsule by mouth daily. VIT D) 30 each 11   Continuous Blood Gluc Receiver (FREESTYLE LIBRE 2 READER) DEVI As directed 1 each 0   cyclobenzaprine (FLEXERIL) 5 MG tablet Take 5 mg by mouth 2 (two) times daily as needed.     FERROUS SULFATE PO Take by mouth.     LEVEMIR FLEXPEN 100 UNIT/ML FlexPen INJECT 70 UNITS INTO THE SKIN AT BEDTIME. 30 mL 0   ondansetron (ZOFRAN) 4 MG tablet TAKE 1 TABLET BY MOUTH EVERY 6 HOURS AS NEEDED FOR NAUSEA. 30 tablet 1   oxyCODONE-acetaminophen (PERCOCET) 10-325 MG tablet Take 1 tablet by mouth every 8 (eight) hours as needed.     amphetamine-dextroamphetamine (ADDERALL) 10 MG tablet TAKE (1) TABLET BY MOUTH TWICE DAILY. 60 tablet 0   atorvastatin (LIPITOR) 20 MG tablet TAKE (1) TABLET BY MOUTH ONCE DAILY. 30 tablet 1   B-D ULTRAFINE III SHORT PEN 31G X 8 MM MISC USE AS DIRECTED TO INJECT INSULIN UP TO 5 TIMES DAILY. (Patient not taking: Reported on  11/30/2021) 100 each 2   BD PEN NEEDLE NANO U/F 32G X 4 MM MISC USE AS DIRECTED TO INJECT INSULIN UP TO 5 TIMES DAILY. 200 each 0   clonazePAM (KLONOPIN) 2 MG tablet TAKE (1) TABLET BY MOUTH (3) TIMES DAILY. 90 tablet 5   Continuous Blood Gluc Sensor (FREESTYLE LIBRE 2 SENSOR) MISC USE TO TEST BLOOD SUGAR AS DIRECTED. APPLY NEW SENSOR EVERY 14 DAYS. 2 each 0   cyanocobalamin (,VITAMIN B-12,) 1000 MCG/ML injection INJECT 1ML INTO THE MUSCLE EVERY 14 DAYS. 10 mL 0   DULoxetine (CYMBALTA) 30 MG capsule TAKE (1) CAPSULE BY MOUTH ONCE DAILY. 90 capsule 1   gabapentin (NEURONTIN) 300 MG capsule TAKE 4 CAPSULES BY  MOUTH AT BEDTIME. 360 capsule 0   glucose blood (FREESTYLE PRECISION NEO TEST) test strip Use as instructed 100 each 2   lamoTRIgine (LAMICTAL) 100 MG tablet 1 tab po qd 90 tablet 1   metFORMIN (GLUCOPHAGE-XR) 500 MG 24 hr tablet TAKE (2) TABLET BY MOUTH ONCE DAILY WITH BREAKFAST. 180 tablet 0   mirtazapine (REMERON) 30 MG tablet TAKE (1) TABLET BY MOUTH AT BEDTIME. 90 tablet 1   NOVOLOG FLEXPEN 100 UNIT/ML FlexPen INJECT 10 UNITS UNDER THE SKIN BEFORE MEALS. 15 mL 0   tiZANidine (ZANAFLEX) 4 MG tablet TAKE (1) TABLET BY MOUTH TWICE DAILY. 180 tablet 0   No facility-administered medications prior to visit.    Allergies  Allergen Reactions   Aspirin Other (See Comments)    Gastric Bypass   Latex Other (See Comments)    blisters   Penicillins     UNSPECIFIED REACTION >"Took large quantities as a child & told to never take again Has patient had a PCN reaction causing immediate rash, facial/tongue/throat swelling, SOB or lightheadedness with hypotension: No Has patient had a PCN reaction causing severe rash involving mucus membranes or skin necrosis: No Has patient had a PCN reaction that required hospitalization: No Has patient had a PCN reaction occurring within the last 10 years: No If all of the above answers are "NO", then may proceed wi   Nickel    Codeine Itching    ROS As  per HPI  PE:    11/30/2021   10:41 AM 05/09/2021    3:37 PM 05/09/2021    1:33 PM  Vitals with BMI  Height 5' 2"  5' 2"  5' 2"   Weight 172 lbs 174 lbs 10 oz 174 lbs  BMI 31.45 67.67 20.94  Systolic 709 628 366  Diastolic 79 88 70  Pulse 93 83 96     Physical Exam  Gen: Alert, well appearing.  Patient is oriented to person, place, time, and situation. AFFECT: pleasant, lucid thought and speech. No further exam today.  LABS:  Last CBC Lab Results  Component Value Date   WBC 6.4 11/30/2021   HGB 12.9 11/30/2021   HCT 39.5 11/30/2021   MCV 94.6 11/30/2021   MCH 31.0 05/22/2017   RDW 12.9 11/30/2021   PLT 273.0 29/47/6546   Last metabolic panel Lab Results  Component Value Date   GLUCOSE 98 11/30/2021   NA 138 11/30/2021   K 5.3 No hemolysis seen (H) 11/30/2021   CL 97 11/30/2021   CO2 33 (H) 11/30/2021   BUN 11 11/30/2021   CREATININE 0.87 11/30/2021   GFRNONAA >60 05/22/2017   CALCIUM 10.6 (H) 11/30/2021   PROT 7.4 05/09/2021   ALBUMIN 4.3 05/09/2021   BILITOT 0.6 05/09/2021   ALKPHOS 81 05/09/2021   AST 33 05/09/2021   ALT 26 05/09/2021   ANIONGAP 9 05/22/2017   Last lipids Lab Results  Component Value Date   CHOL 161 05/09/2021   HDL 45.00 05/09/2021   LDLCALC 93 05/09/2021   LDLDIRECT 174.0 08/08/2015   TRIG 116.0 05/09/2021   CHOLHDL 4 05/09/2021   Last hemoglobin A1c Lab Results  Component Value Date   HGBA1C 6.8 (A) 11/30/2021   HGBA1C 6.8 11/30/2021   HGBA1C 6.8 (A) 11/30/2021   HGBA1C 6.8 11/30/2021   Last thyroid functions Lab Results  Component Value Date   TSH 3.22 03/29/2020   T3TOTAL 88.2 10/11/2016   Lab Results  Component Value Date   IRON 83 11/30/2021   TIBC 323 11/30/2021  FERRITIN 69 11/30/2021   Lab Results  Component Value Date   RFXJOITG54 982 (H) 11/30/2021   Last vitamin D Lab Results  Component Value Date   VD25OH 56.21 11/30/2021   IMPRESSION AND PLAN:  #1 chronic anxiety, agoraphobia. Excessive  interpersonal strife with her son who is now living with her. Trial of propranolol 40 mg 3 times daily as needed anxiety. Continue clonazepam 2 mg 3 times a day, duloxetine 30 mg a day, and mirtazapine 30 mg nightly.   Strongly encouraged her to start seeing a counselor but she expressed quite a bit of apprehension about this.  #2 mood disorder NOS.   Stable on Lamictal 100 mg daily, Cymbalta 30 mg a day, and Remeron 30 mg nightly.  3.  Adult ADD: Stable on Adderall 10 mg twice daily.  n After Visit Summary was printed and given to the patient.  4.  Diabetes, well controlled. Point-of-care hemoglobin A1c today 6.8%. Continue Levemir 70 units/day and NovoLog 10 units before every meal as well as metformin XR 1000 mg a day.  #5 malabsorption syndrome-> remote history of f Roux-en-Y gastric bypass surgery. Continue vitamin B12 and vitamin D supplement. Check B12, vitamin D, and iron levels today.  6 chronic pain syndrome. Opioid management/monitoring per Dr. Davy Pique, her pain specialist.  FOLLOW UP: Return in about 6 months (around 06/01/2022) for annual CPE (fasting).  Signed:  Crissie Sickles, MD           12/03/2021

## 2021-12-01 LAB — IRON,TIBC AND FERRITIN PANEL
%SAT: 26 % (calc) (ref 16–45)
Ferritin: 69 ng/mL (ref 16–288)
Iron: 83 ug/dL (ref 45–160)
TIBC: 323 mcg/dL (calc) (ref 250–450)

## 2021-12-03 ENCOUNTER — Telehealth: Payer: Self-pay | Admitting: Family Medicine

## 2021-12-03 NOTE — Telephone Encounter (Signed)
Pt called back and states that she looked over the results and things looked ok to her, however she said that Vevelyn Royals can give her a call back tomorrow.

## 2021-12-04 NOTE — Telephone Encounter (Signed)
Pt advised of results. 

## 2021-12-04 NOTE — Telephone Encounter (Signed)
LM for pt to return call.  Tammi Sou, MD  12/03/2021  8:02 AM EDT     Potassium mildly elevated. Try to minimize potassium in diet. All other labs stable. No new recommendations.

## 2021-12-12 ENCOUNTER — Other Ambulatory Visit: Payer: Self-pay | Admitting: "Endocrinology

## 2021-12-12 DIAGNOSIS — E119 Type 2 diabetes mellitus without complications: Secondary | ICD-10-CM

## 2021-12-12 DIAGNOSIS — Z9889 Other specified postprocedural states: Secondary | ICD-10-CM | POA: Diagnosis not present

## 2021-12-12 DIAGNOSIS — M5416 Radiculopathy, lumbar region: Secondary | ICD-10-CM | POA: Diagnosis not present

## 2021-12-12 DIAGNOSIS — G894 Chronic pain syndrome: Secondary | ICD-10-CM | POA: Diagnosis not present

## 2021-12-12 DIAGNOSIS — M5412 Radiculopathy, cervical region: Secondary | ICD-10-CM | POA: Diagnosis not present

## 2022-01-10 ENCOUNTER — Other Ambulatory Visit: Payer: Self-pay | Admitting: "Endocrinology

## 2022-01-14 ENCOUNTER — Other Ambulatory Visit: Payer: Self-pay | Admitting: "Endocrinology

## 2022-01-14 DIAGNOSIS — E119 Type 2 diabetes mellitus without complications: Secondary | ICD-10-CM

## 2022-01-15 ENCOUNTER — Other Ambulatory Visit: Payer: Self-pay | Admitting: Family Medicine

## 2022-01-15 DIAGNOSIS — E119 Type 2 diabetes mellitus without complications: Secondary | ICD-10-CM

## 2022-02-08 ENCOUNTER — Other Ambulatory Visit: Payer: Self-pay | Admitting: Family Medicine

## 2022-02-08 DIAGNOSIS — F909 Attention-deficit hyperactivity disorder, unspecified type: Secondary | ICD-10-CM

## 2022-02-08 DIAGNOSIS — Z79899 Other long term (current) drug therapy: Secondary | ICD-10-CM

## 2022-02-08 NOTE — Telephone Encounter (Signed)
Requesting: adderall  Contract: 06/27/20 UDS: 05/09/21 Last Visit: 11/30/21 Next Visit: 05/02/22 Last Refill: 11/30/21 (60,0)  Please Advise. Med pending

## 2022-03-13 ENCOUNTER — Telehealth: Payer: Self-pay | Admitting: Family Medicine

## 2022-03-13 NOTE — Telephone Encounter (Signed)
Refill was sent on 02/12/22 for 90 day supply to [facility].

## 2022-03-13 NOTE — Telephone Encounter (Signed)
LVM for pt to return call

## 2022-03-13 NOTE — Telephone Encounter (Signed)
[facility] called and is requesting a medication refill on behalf of Diana [name]. They are requesting a refill on Atorvastatin.

## 2022-03-14 NOTE — Telephone Encounter (Signed)
Pt advised refill sent on 02/12/22 for 90 days. If she only received 30 tabs, the pharmacy should still have the other 60 tabs from recent prescription. She may call back with alternatives for Levemir and Novolog due to insurance changes.

## 2022-03-15 ENCOUNTER — Other Ambulatory Visit: Payer: Self-pay | Admitting: Family Medicine

## 2022-03-15 DIAGNOSIS — Z79899 Other long term (current) drug therapy: Secondary | ICD-10-CM

## 2022-03-15 DIAGNOSIS — E119 Type 2 diabetes mellitus without complications: Secondary | ICD-10-CM

## 2022-03-15 DIAGNOSIS — F909 Attention-deficit hyperactivity disorder, unspecified type: Secondary | ICD-10-CM

## 2022-03-19 ENCOUNTER — Other Ambulatory Visit: Payer: Self-pay | Admitting: "Endocrinology

## 2022-03-19 NOTE — Telephone Encounter (Signed)
Requesting: adderall  Contract: 06/27/20 UDS: 05/09/21 Last Visit: 11/30/21 Next Visit: 05/02/22 Last Refill: 02/12/22 (60,0)   Please Advise. Med pending

## 2022-04-11 ENCOUNTER — Other Ambulatory Visit: Payer: Self-pay | Admitting: Family Medicine

## 2022-04-15 ENCOUNTER — Telehealth: Payer: Self-pay | Admitting: Family Medicine

## 2022-04-15 NOTE — Telephone Encounter (Signed)
Copied from [facility] [id number]. Topic: Medicare AWV >> Apr 15, 2022 12:41 PM Gillis Santa wrote: Reason for CRM: Called patient to schedule Medicare Annual Wellness Visit (AWV). Unable to reach patient.  Last date of AWV: 04/18/2021  Please schedule an appointment at any time with Otila Kluver, [facility].  If any questions, please contact me at [phone].  Thank you ,  Shaune Pollack [facility] AWV TEAM Direct Dial [phone]

## 2022-04-22 ENCOUNTER — Telehealth: Payer: Self-pay | Admitting: Family Medicine

## 2022-04-22 ENCOUNTER — Other Ambulatory Visit: Payer: Self-pay | Admitting: "Endocrinology

## 2022-04-22 ENCOUNTER — Other Ambulatory Visit: Payer: Self-pay | Admitting: Family Medicine

## 2022-04-22 DIAGNOSIS — Z79899 Other long term (current) drug therapy: Secondary | ICD-10-CM

## 2022-04-22 DIAGNOSIS — F909 Attention-deficit hyperactivity disorder, unspecified type: Secondary | ICD-10-CM

## 2022-04-22 MED ORDER — TRESIBA FLEXTOUCH 100 UNIT/ML ~~LOC~~ SOPN: PEN_INJECTOR | SUBCUTANEOUS | 1 refills | Status: DC

## 2022-04-22 NOTE — Telephone Encounter (Signed)
Gabriel Cirri from [place] called on behalf of [name] to inquire her starting Tyler Aas in replacement of the  Levemir. Please give Dixie  a call to discuss.

## 2022-04-22 NOTE — Telephone Encounter (Signed)
I just sent in tresiba rx. She will be taking the same dose (70 units once daily). She still needs to take novolog and metformin---no changes in these.

## 2022-04-22 NOTE — Telephone Encounter (Signed)
Pt has scheduled f/u on 3/21. Please advise if this can be changed or she needs to wait until f/u

## 2022-04-22 NOTE — Telephone Encounter (Signed)
Requesting: adderall Contract: 06/27/20 UDS: 05/09/21 Last Visit: 11/30/21 Next Visit: 05/02/22 Last Refill: 03/19/22( 60,0)  RF request for atorvastatin LOV: 11/30/21 Next ov:  05/02/22 Last written: 03/19/22 (30,0)

## 2022-04-23 NOTE — Telephone Encounter (Signed)
Pt advised of med change.  

## 2022-05-02 ENCOUNTER — Encounter: Payer: No Typology Code available for payment source | Admitting: Family Medicine

## 2022-05-10 ENCOUNTER — Other Ambulatory Visit: Payer: Self-pay | Admitting: Family Medicine

## 2022-05-10 ENCOUNTER — Other Ambulatory Visit: Payer: Self-pay | Admitting: "Endocrinology

## 2022-05-13 MED ORDER — CYANOCOBALAMIN 1000 MCG/ML IJ SOLN: INTRAMUSCULAR | 0 refills | Status: DC

## 2022-05-13 MED ORDER — GABAPENTIN 300 MG PO CAPS: ORAL_CAPSULE | ORAL | 0 refills | Status: DC

## 2022-05-21 ENCOUNTER — Other Ambulatory Visit: Payer: Self-pay | Admitting: Family Medicine

## 2022-05-22 ENCOUNTER — Telehealth: Payer: Self-pay

## 2022-05-22 NOTE — Telephone Encounter (Signed)
Enrique Sack calling form [facility].  She states that she spoke to patient today regarding patient's blood pressure dropping. Med is making patient have angry mood swings (another medication) I do not recall name.  She called [place] because she wanted [place] to be aware.  Patient told her of upcoming appt with Dr. Milinda Cave on 06/12/22.  Should we bring her in earlier? Please advise.

## 2022-05-23 NOTE — Telephone Encounter (Signed)
Need more specifics: How low is her blood pressure dropping?. She is not on any blood pressure medication. Let me know.

## 2022-06-12 ENCOUNTER — Encounter: Payer: No Typology Code available for payment source | Admitting: Family Medicine

## 2022-06-19 ENCOUNTER — Encounter: Payer: No Typology Code available for payment source | Admitting: Family Medicine

## 2022-06-19 DIAGNOSIS — F909 Attention-deficit hyperactivity disorder, unspecified type: Secondary | ICD-10-CM

## 2022-06-19 DIAGNOSIS — Z79899 Other long term (current) drug therapy: Secondary | ICD-10-CM

## 2022-06-19 DIAGNOSIS — M5412 Radiculopathy, cervical region: Secondary | ICD-10-CM | POA: Diagnosis not present

## 2022-06-20 ENCOUNTER — Encounter: Payer: No Typology Code available for payment source | Admitting: Family Medicine

## 2022-06-21 NOTE — Patient Instructions (Signed)

## 2022-06-24 ENCOUNTER — Encounter: Payer: Self-pay | Admitting: Family Medicine

## 2022-06-24 ENCOUNTER — Ambulatory Visit (INDEPENDENT_AMBULATORY_CARE_PROVIDER_SITE_OTHER): Payer: No Typology Code available for payment source | Admitting: Family Medicine

## 2022-06-24 VITALS — BP 138/85 | HR 98 | Ht 64.0 in | Wt 171.2 lb

## 2022-06-24 DIAGNOSIS — Z7984 Long term (current) use of oral hypoglycemic drugs: Secondary | ICD-10-CM

## 2022-06-24 DIAGNOSIS — F909 Attention-deficit hyperactivity disorder, unspecified type: Secondary | ICD-10-CM

## 2022-06-24 DIAGNOSIS — R5382 Chronic fatigue, unspecified: Secondary | ICD-10-CM

## 2022-06-24 DIAGNOSIS — Z0000 Encounter for general adult medical examination without abnormal findings: Secondary | ICD-10-CM

## 2022-06-24 DIAGNOSIS — Z23 Encounter for immunization: Secondary | ICD-10-CM

## 2022-06-24 DIAGNOSIS — E7800 Pure hypercholesterolemia, unspecified: Secondary | ICD-10-CM | POA: Diagnosis not present

## 2022-06-24 DIAGNOSIS — E559 Vitamin D deficiency, unspecified: Secondary | ICD-10-CM | POA: Diagnosis not present

## 2022-06-24 DIAGNOSIS — Z9884 Bariatric surgery status: Secondary | ICD-10-CM

## 2022-06-24 DIAGNOSIS — E119 Type 2 diabetes mellitus without complications: Secondary | ICD-10-CM | POA: Diagnosis not present

## 2022-06-24 DIAGNOSIS — Z79899 Other long term (current) drug therapy: Secondary | ICD-10-CM

## 2022-06-24 DIAGNOSIS — Z1211 Encounter for screening for malignant neoplasm of colon: Secondary | ICD-10-CM

## 2022-06-24 DIAGNOSIS — E538 Deficiency of other specified B group vitamins: Secondary | ICD-10-CM

## 2022-06-24 LAB — POCT GLYCOSYLATED HEMOGLOBIN (HGB A1C)
HbA1c POC (<> result, manual entry): 6.8 % (ref 4.0–5.6)
HbA1c, POC (controlled diabetic range): 6.8 % (ref 0.0–7.0)
HbA1c, POC (prediabetic range): 6.8 % — AB (ref 5.7–6.4)
Hemoglobin A1C: 6.8 % — AB (ref 4.0–5.6)

## 2022-06-24 MED ORDER — LAMOTRIGINE 100 MG PO TABS: ORAL_TABLET | ORAL | 1 refills | Status: DC

## 2022-06-24 MED ORDER — GABAPENTIN 300 MG PO CAPS: ORAL_CAPSULE | ORAL | 0 refills | Status: DC

## 2022-06-24 MED ORDER — MIRTAZAPINE 30 MG PO TABS: ORAL_TABLET | ORAL | 1 refills | Status: DC

## 2022-06-24 MED ORDER — BD PEN NEEDLE NANO U/F 32G X 4 MM MISC: 0 refills | Status: DC

## 2022-06-24 MED ORDER — METFORMIN HCL ER 500 MG PO TB24: ORAL_TABLET | ORAL | 1 refills | Status: DC

## 2022-06-24 MED ORDER — NOVOLOG FLEXPEN 100 UNIT/ML ~~LOC~~ SOPN: PEN_INJECTOR | SUBCUTANEOUS | 0 refills | Status: DC

## 2022-06-24 MED ORDER — FREESTYLE PRECISION NEO TEST VI STRP: ORAL_STRIP | 2 refills | Status: DC

## 2022-06-24 MED ORDER — AMPHETAMINE-DEXTROAMPHETAMINE 20 MG PO TABS
20.0000 mg | ORAL_TABLET | Freq: Two times a day (BID) | ORAL | 0 refills | Status: DC
Start: 2022-06-24 — End: 2022-08-09

## 2022-06-24 MED ORDER — CYANOCOBALAMIN 1000 MCG/ML IJ SOLN: INTRAMUSCULAR | 0 refills | Status: DC

## 2022-06-24 MED ORDER — CLONAZEPAM 2 MG PO TABS: ORAL_TABLET | ORAL | 5 refills | Status: DC

## 2022-06-24 MED ORDER — ATORVASTATIN CALCIUM 20 MG PO TABS: ORAL_TABLET | ORAL | 1 refills | Status: DC

## 2022-06-24 MED ORDER — PROPRANOLOL HCL 40 MG PO TABS: ORAL_TABLET | ORAL | 3 refills | Status: DC

## 2022-06-24 MED ORDER — DULOXETINE HCL 30 MG PO CPEP: ORAL_CAPSULE | ORAL | 1 refills | Status: DC

## 2022-06-24 NOTE — Progress Notes (Signed)
Office Note 06/24/2022  CC:  Chief Complaint  Patient presents with   Annual Exam    Fasting CPE   Patient is a 67 y.o. female who is here accompanied by her husband for annual health maintenance exam and f/u multiple chronic medical problems. A/P as of last visit: "#1 chronic anxiety, agoraphobia. Excessive interpersonal strife with her son who is now living with her. Trial of propranolol 40 mg 3 times daily as needed anxiety. Continue clonazepam 2 mg 3 times a day, duloxetine 30 mg a day, and mirtazapine 30 mg nightly.   Strongly encouraged her to start seeing a counselor but she expressed quite a bit of apprehension about this.   #2 mood disorder NOS.   Stable on Lamictal 100 mg daily, Cymbalta 30 mg a day, and Remeron 30 mg nightly.   3.  Adult ADD: Stable on Adderall 10 mg twice daily.  n After Visit Summary was printed and given to the patient.   4.  Diabetes, well controlled. Point-of-care hemoglobin A1c today 6.8%. Continue Levemir 70 units/day and NovoLog 10 units before every meal as well as metformin XR 1000 mg a day.   #5 malabsorption syndrome-> remote history of f Roux-en-Y gastric bypass surgery. Continue vitamin B12 and vitamin D supplement. Check B12, vitamin D, and iron levels today.   6 chronic pain syndrome. Opioid management/monitoring per Dr. Lorrine Kin, her pain specialist."  INTERIM HX: Glucoses up and down, some as low as 50s. Adjusting insulin, tresiba now at 42 U, typically 6-8 mealtime insulin as well.  Not eating consistently.  Anxiety very high, particularly agoraphobia. Depression mildly active but for the most part is secondary to her severe anxiety and it's disabling nature.  She stopped lamotrigine because she felt like it was not doing any good.  She feels no different off of it for the last month. Chronic fatigue.   PMP AWARE reviewed today: most recent rx for clonazepam was filled 05/10/2022, # 90, rx by me.  Most recent Adderall  prescription filled 04/22/2022, #60, prescription by me. No red flags.  Past Medical History:  Diagnosis Date   Abnormal liver diagnostic imaging 05/20/2017   CT abd->cirrhosis with atypical lesions that MRI f/u determined to be focal regions of severe steatosis.   Abnormal mammogram 12/2014   F/u diagnostic mammo and u/s NORMAL--resume annual screening mammography   Arthritis    Bipolar disorder (HCC)    questionable   Cervical radiculopathy    Post cerv fusion C4-5, C5-6--stable as of 06/2019 NS f/u.   Chronic fatigue    Chronic nausea 2012/13   Normal upper GI  12/2011 (mild GERD noted +small hiatal hernia)+   Chronic pain syndrome    Chronic C spine pain with radiculopathy.  Dr. Laurian Brim at [facility] was her pain mgmt MD and he discharged her from [facility] pain mgmt center b/c she failed a UDS (+marijuana) 04/27/15. Dr. Dutch Quint referred her to pain mgmt 2021.   Chronic renal insufficiency, stage II (mild) 02/2015   GFR 70s   Colon cancer screening 03/06/2011   iFob 11/2010 through her insurer was negative. iFOB 01/2015 NEG.   Depression    Diabetes mellitus    urine protein-Cr ratio nl 09/2010, D.R. Screen neg 10/2009   Fibromyalgia    Fracture of rib of left side 08/14/2015   5th and 6th   Hepatic steatosis 2005   ultrasound.  Also, 07/2017 MRI abd--> plus a few focal areas of more severe steatosis.  Stable fatty liver on  ultrasound 05/2021.   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  Spleen normal 2019.  Stable fatty liver on ultrasound 05/2021   Nephrolithiasis 11/2012   Neurosis, anxiety, panic type    Obesity    s/p bariatric surgery    Osteopenia 12/2010   2012 Hip T score -2.  09/2020 T score -2.2   Other B-complex deficiencies    Panic attacks    Restless legs syndrome    Syncopal episodes    7/73/17 in the setting of dehydration and sedative medication   Tobacco dependence    Vitamin B12 deficiency    Vitamin D deficiency 09/2009   Wears dentures    uppers and lowers    Past Surgical History:  Procedure Laterality Date   ABDOMINAL HYSTERECTOMY     fibroids, ovaries remain   APPENDECTOMY     BACK SURGERY     BLADDER SURGERY     bladder tack   CERVICAL SPINE SURGERY  09/2009; 0981;1914   2014-ant cerv decomp (Dr. Channing Mutters). 2018 fusion (Dr. Jordan Likes)   CHOLECYSTECTOMY  04/2010   w/lysis of adhesions (open procedure)--path showed chronic cholecystitis and cholesterol polyp.   CT CERVICAL SPINE W CONTRAST  ([facility] HX)  06/2019   No evidence of hardware complication or loosening of her instrumentation.   CYSTOSCOPY/RETROGRADE/URETEROSCOPY/STONE EXTRACTION WITH BASKET Left 11/21/2012   Left UVJ stone.  Procedure: CYSTOSCOPY/RETROGRADE/URETEROSCOPY/STONE EXTRACTION WITH BASKET insertion double j stent;  Surgeon: Kathi Ludwig, MD;  Location: [facility];  Service: Urology;  Laterality: Left;   DEXA  09/2020   T score -2.2. Rpt 2 yrs.   EPIDURAL BLOCK INJECTION  03/2011   Cervical   HERNIA REPAIR  2005   mesenteric hernia repair, with lysis of adhesions (presented with SBO)   POSTERIOR CERVICAL FUSION/FORAMINOTOMY N/A 12/31/2016   Procedure: Posterior Cervical Fusion with lateral mass fixation - Cervical four-five, Cervical five-six;  Surgeon: Julio Sicks, MD;  Location: [facility] OR;  Service: Neurosurgery;  Laterality: N/A;   ROUX-EN-Y GASTRIC BYPASS     TONSILLECTOMY      Family History  Problem Relation Age of Onset   Heart disease Mother    Cancer Mother    Hypertension Mother    Hyperlipidemia Mother    Stroke Mother    Heart disease Father    Hypertension Father    Hyperlipidemia Father    Breast cancer Sister     Cancer Sister    Heart disease Sister    Alzheimer's disease Brother    Kidney disease Brother     Social History   Socioeconomic History   Marital status: Significant Other    Spouse name: Not on file   Number of children: Not on file   Years of education: Not on file   Highest education level: Not on file  Occupational History   Not on file  Tobacco Use   Smoking status: Former    Packs/day: 1.00    Years: 1.00    Additional pack years: 0.00    Total pack years: 1.00    Types: Cigarettes    Quit date: 04/11/2016    Years since quitting: 6.2  Smokeless tobacco: Never  Vaping Use   Vaping Use: Never used  Substance and Sexual Activity   Alcohol use: No   Drug use: No   Sexual activity: Not on file  Other Topics Concern   Not on file  Social History Narrative   Lives with longtime boyfriend in [place], [place].   Had a stepson but he committed suicide 2012/13.   Disabled due to anxiety.   Longtime smoker, quits frequently but restarts.   No alcohol or illegal drugs.   No exercise.   Social Determinants of Health   Financial Resource Strain: Low Risk  (04/18/2021)   Overall Financial Resource Strain (CARDIA)    Difficulty of Paying Living Expenses: Not hard at all  Food Insecurity: No Food Insecurity (04/18/2021)   Hunger Vital Sign    Worried About Running Out of Food in the Last Year: Never true    Ran Out of Food in the Last Year: Never true  Transportation Needs: No Transportation Needs (04/18/2021)   PRAPARE - Administrator, Civil Service (Medical): No    Lack of Transportation (Non-Medical): No  Physical Activity: Insufficiently Active (04/18/2021)   Exercise Vital Sign    Days of Exercise per Week: 7 days    Minutes of Exercise per Session: 20 min  Stress: Stress Concern Present (04/18/2021)   [employer] of Occupational Health - Occupational Stress Questionnaire    Feeling of Stress : Rather much  Social Connections: Moderately Isolated  (04/18/2021)   Social Connection and Isolation Panel [NHANES]    Frequency of Communication with Friends and Family: Never    Frequency of Social Gatherings with Friends and Family: More than three times a week    Attends Religious Services: Never    Database administrator or Organizations: No    Attends Banker Meetings: Never    Marital Status: Living with partner  Intimate Partner Violence: Not At Risk (04/18/2021)   Humiliation, Afraid, Rape, and Kick questionnaire    Fear of Current or Ex-Partner: No    Emotionally Abused: No    Physically Abused: No    Sexually Abused: No    Outpatient Medications Prior to Visit  Medication Sig Dispense Refill   calcium elemental as carbonate (TUMS ULTRA 1000) 400 MG chewable tablet Chew 1,000 mg by mouth daily.     Cholecalciferol 2000 UNITS CAPS Take 1 capsule (2,000 Units total) by mouth daily. (Patient taking differently: Take 1 capsule by mouth daily. VIT D) 30 each 11   Continuous Blood Gluc Receiver (FREESTYLE LIBRE 2 READER) DEVI As directed 1 each 0   Continuous Blood Gluc Sensor (FREESTYLE LIBRE 2 SENSOR) MISC USE TO TEST BLOOD SUGAR AS DIRECTED. APPLY NEW SENSOR EVERY 14 DAYS. 2 each 2   cyclobenzaprine (FLEXERIL) 5 MG tablet Take 5 mg by mouth 2 (two) times daily as needed.     FERROUS SULFATE PO Take by mouth.     ondansetron (ZOFRAN) 4 MG tablet TAKE 1 TABLET BY MOUTH EVERY 6 HOURS AS NEEDED FOR NAUSEA. 30 tablet 1   oxyCODONE-acetaminophen (PERCOCET) 10-325 MG tablet Take 1 tablet by mouth every 8 (eight) hours as needed.     amphetamine-dextroamphetamine (ADDERALL) 10 MG tablet TAKE (1) TABLET BY MOUTH TWICE DAILY. 60 tablet 0   insulin degludec (TRESIBA FLEXTOUCH) 100 UNIT/ML FlexTouch Pen INJECT 70 UNITS SUBCUTANEOUSLY (Patient not taking: Reported on 06/24/2022) 15 mL 1   atorvastatin (LIPITOR) 20 MG tablet TAKE (1) TABLET BY MOUTH  ONCE DAILY. 90 tablet 1   clonazePAM (KLONOPIN) 2 MG tablet TAKE (1) TABLET BY MOUTH (3)  TIMES DAILY. 90 tablet 5   cyanocobalamin (VITAMIN B12) 1000 MCG/ML injection INJECT INTO THE MUSCLE EVERY 14 DAYS. 4 mL 0   DULoxetine (CYMBALTA) 30 MG capsule TAKE (1) CAPSULE BY MOUTH ONCE DAILY. 90 capsule 1   gabapentin (NEURONTIN) 300 MG capsule MUST KEEP APPT FOR FURTHER REFILLS 120 capsule 0   gabapentin (NEURONTIN) 300 MG capsule TAKE 4 CAPSULES BY MOUTH AT BEDTIME. 120 capsule 0   glucose blood (FREESTYLE PRECISION NEO TEST) test strip Use as instructed 100 each 2   insulin aspart (NOVOLOG FLEXPEN) 100 UNIT/ML FlexPen INJECT 10 UNITS UNDER THE SKIN BEFORE MEALS. 15 mL 0   Insulin Pen Needle (BD PEN NEEDLE NANO U/F) 32G X 4 MM MISC USE AS DIRECTED TO INJECT INSULIN UP TO 5 TIMES DAILY. 200 each 0   lamoTRIgine (LAMICTAL) 100 MG tablet 1 tab po qd (Patient not taking: Reported on 06/24/2022) 90 tablet 1   metFORMIN (GLUCOPHAGE-XR) 500 MG 24 hr tablet TAKE (2) TABLET BY MOUTH ONCE DAILY WITH BREAKFAST. 60 tablet 0   mirtazapine (REMERON) 30 MG tablet TAKE (1) TABLET BY MOUTH AT BEDTIME. 90 tablet 1   propranolol (INDERAL) 40 MG tablet 1 tab po q6 hours as needed for acute anxiety (Patient not taking: Reported on 06/24/2022) 60 tablet 3   No facility-administered medications prior to visit.    Allergies  Allergen Reactions   Aspirin Other (See Comments)    Gastric Bypass   Latex Other (See Comments)    blisters   Penicillins     UNSPECIFIED REACTION >"Took large quantities as a child & told to never take again Has patient had a PCN reaction causing immediate rash, facial/tongue/throat swelling, SOB or lightheadedness with hypotension: No Has patient had a PCN reaction causing severe rash involving mucus membranes or skin necrosis: No Has patient had a PCN reaction that required hospitalization: No Has patient had a PCN reaction occurring within the last 10 years: No If all of the above answers are "NO", then may proceed wi   Nickel    Codeine Itching    Review of Systems   Constitutional:  Negative for appetite change, chills, fatigue and fever.  HENT:  Negative for congestion, dental problem, ear pain and sore throat.   Eyes:  Negative for discharge, redness and visual disturbance.  Respiratory:  Negative for cough, chest tightness, shortness of breath and wheezing.   Cardiovascular:  Negative for chest pain, palpitations and leg swelling.  Gastrointestinal:  Negative for abdominal pain, blood in stool, diarrhea, nausea and vomiting.  Genitourinary:  Negative for difficulty urinating, dysuria, flank pain, frequency, hematuria and urgency.  Musculoskeletal:  Positive for neck pain (chronic). Negative for arthralgias, back pain, joint swelling, myalgias and neck stiffness.  Skin:  Negative for pallor and rash.  Neurological:  Negative for dizziness, speech difficulty, weakness and headaches.  Hematological:  Negative for adenopathy. Does not bruise/bleed easily.  Psychiatric/Behavioral:  Negative for confusion and sleep disturbance. The patient is not nervous/anxious.     PE;    06/24/2022    3:02 PM 11/30/2021   10:41 AM 05/09/2021    3:37 PM  Vitals with BMI  Height 5\' 4"  5\' 2"  5\' 2"   Weight 171 lbs 3 oz 172 lbs 174 lbs 10 oz  BMI 29.37 31.45 31.93  Systolic 138 143 027  Diastolic 85 79 88  Pulse 98 93  83    Gen: Alert, well appearing.  Patient is oriented to person, place, time, and situation. AFFECT: pleasant, lucid thought and speech. ENT: Ears: EACs clear, normal epithelium.  TMs with good light reflex and landmarks bilaterally.  Eyes: no injection, icteris, swelling, or exudate.  EOMI, PERRLA. Nose: no drainage or turbinate edema/swelling.  No injection or focal lesion.  Mouth: lips without lesion/swelling.  Oral mucosa pink and moist.  Dentition intact and without obvious caries or gingival swelling.  Oropharynx without erythema, exudate, or swelling.  Neck: supple/nontender.  No LAD, mass, or TM.  Carotid pulses 2+ bilaterally, without  bruits. CV: RRR, no m/r/g.   LUNGS: CTA bilat, nonlabored resps, good aeration in all lung fields. ABD: soft, NT, ND, BS normal.  No hepatospenomegaly or mass.  No bruits. EXT: no clubbing, cyanosis, or edema.  Musculoskeletal: no joint swelling, erythema, warmth, or tenderness.  ROM of all joints intact. Skin - no sores or suspicious lesions or rashes or color changes Foot exam - no swelling, tenderness or skin or vascular lesions. Color and temperature is normal. Sensation is intact. Peripheral pulses are palpable. Toenails are normal.  Pertinent labs:  Lab Results  Component Value Date   TSH 3.22 03/29/2020   Lab Results  Component Value Date   WBC 6.4 11/30/2021   HGB 12.9 11/30/2021   HCT 39.5 11/30/2021   MCV 94.6 11/30/2021   PLT 273.0 11/30/2021   Lab Results  Component Value Date   IRON 83 11/30/2021   TIBC 323 11/30/2021   FERRITIN 69 11/30/2021   Lab Results  Component Value Date   CREATININE 0.87 11/30/2021   BUN 11 11/30/2021   NA 138 11/30/2021   K 5.3 No hemolysis seen (H) 11/30/2021   CL 97 11/30/2021   CO2 33 (H) 11/30/2021   Lab Results  Component Value Date   ALT 26 05/09/2021   AST 33 05/09/2021   ALKPHOS 81 05/09/2021   BILITOT 0.6 05/09/2021   Lab Results  Component Value Date   CHOL 161 05/09/2021   Lab Results  Component Value Date   HDL 45.00 05/09/2021   Lab Results  Component Value Date   LDLCALC 93 05/09/2021   Lab Results  Component Value Date   TRIG 116.0 05/09/2021   Lab Results  Component Value Date   CHOLHDL 4 05/09/2021   Lab Results  Component Value Date   HGBA1C 6.8 (A) 06/24/2022   HGBA1C 6.8 06/24/2022   HGBA1C 6.8 (A) 06/24/2022   HGBA1C 6.8 06/24/2022   Lab Results  Component Value Date   VITAMINB12 912 (H) 11/30/2021   Last vitamin D Lab Results  Component Value Date   VD25OH 56.21 11/30/2021   ASSESSMENT AND PLAN:   #1 Health maintenance exam: Reviewed age and gender appropriate health  maintenance issues (prudent diet, regular exercise, health risks of tobacco and excessive alcohol, use of seatbelts, fire alarms in home, use of sunscreen).  Also reviewed age and gender appropriate health screening as well as vaccine recommendations. Vaccines: Prevnar 20 today. Labs: Health panel, see below for additional Cervical ca screening: N/A.  Patient with history of total hysterectomy for benign diagnosis. Breast ca screening: Normal mammogram October 2023.  Repeat October 2024. Colon ca screening: She has Cologuard collection kit at home already.  #2 type 2 diabetes without complication.  Erratic glucoses but overall good control. She states she no longer follows up with her endocrinologist. POC A1c today is 6.8%. Discussed appropriate dietary habits and  insulin titration to avoid severe hypoglycemia.  Continue metformin XR 1000 mg a day. Feet exam normal today. Ophthalmology referral today.  #3 Mood disorder NOS, GAD, agoraphobia, adult ADD. Continue duloxetine 30 mg a day and Remeron 30 mg nightly.  Continue clonazepam 2 mg 3 times daily.  Increase Adderall to 20 mg twice a day. Controlled substance contract updated. Urine drug screen today.  #4 malabsorption syndrome-> remote history of f Roux-en-Y gastric bypass surgery. Continue vitamin B12 and vitamin D supplement. Check B12, vitamin D, and iron levels today.  #5 hypercholesterolemia, doing well on atorvastatin 20 mg a day. Lipid panel and hepatic panel today.  An After Visit Summary was printed and given to the patient.  FOLLOW UP:  Return in about 3 months (around 09/24/2022) for routine chronic conditions; please schedule AWV.  Signed:  Santiago Bumpers, MD           06/24/2022

## 2022-06-25 ENCOUNTER — Telehealth: Payer: Self-pay

## 2022-06-25 LAB — COMPREHENSIVE METABOLIC PANEL
ALT: 28 U/L (ref 0–35)
AST: 34 U/L (ref 0–37)
Albumin: 4.2 g/dL (ref 3.5–5.2)
Alkaline Phosphatase: 92 U/L (ref 39–117)
BUN: 14 mg/dL (ref 6–23)
CO2: 29 mEq/L (ref 19–32)
Calcium: 9.5 mg/dL (ref 8.4–10.5)
Chloride: 102 mEq/L (ref 96–112)
Creatinine, Ser: 0.84 mg/dL (ref 0.40–1.20)
GFR: 72.9 mL/min (ref >60.00)
Glucose, Bld: 79 mg/dL (ref 70–99)
Potassium: 4.9 mEq/L (ref 3.5–5.1)
Sodium: 140 mEq/L (ref 135–145)
Total Bilirubin: 0.4 mg/dL (ref 0.2–1.2)
Total Protein: 7.5 g/dL (ref 6.0–8.3)

## 2022-06-25 LAB — CBC WITH DIFFERENTIAL/PLATELET
Basophils Absolute: 0.1 10*3/uL (ref 0.0–0.1)
Basophils Relative: 1.2 % (ref 0.0–3.0)
Eosinophils Absolute: 0.2 10*3/uL (ref 0.0–0.7)
Eosinophils Relative: 2.1 % (ref 0.0–5.0)
HCT: 39.6 % (ref 36.0–46.0)
Hemoglobin: 13.1 g/dL (ref 12.0–15.0)
Lymphocytes Relative: 46 % (ref 12.0–46.0)
Lymphs Abs: 3.7 10*3/uL (ref 0.7–4.0)
MCHC: 33 g/dL (ref 30.0–36.0)
MCV: 95.5 fl (ref 78.0–100.0)
Monocytes Absolute: 0.4 10*3/uL (ref 0.1–1.0)
Monocytes Relative: 5.1 % (ref 3.0–12.0)
Neutro Abs: 3.7 10*3/uL (ref 1.4–7.7)
Neutrophils Relative %: 45.6 % (ref 43.0–77.0)
Platelets: 291 10*3/uL (ref 150.0–400.0)
RBC: 4.15 Mil/uL (ref 3.87–5.11)
RDW: 12.9 % (ref 11.5–15.5)
WBC: 8 10*3/uL (ref 4.0–10.5)

## 2022-06-25 LAB — LIPID PANEL
Cholesterol: 150 mg/dL (ref 0–200)
HDL: 50.3 mg/dL (ref >39.00)
LDL Cholesterol: 79 mg/dL (ref 0–99)
NonHDL: 99.59
Total CHOL/HDL Ratio: 3
Triglycerides: 105 mg/dL (ref 0.0–149.0)
VLDL: 21 mg/dL (ref 0.0–40.0)

## 2022-06-25 LAB — VITAMIN B12: Vitamin B-12: 720 pg/mL (ref 211–911)

## 2022-06-25 LAB — VITAMIN D 25 HYDROXY (VIT D DEFICIENCY, FRACTURES): VITD: 52.18 ng/mL (ref 30.00–100.00)

## 2022-06-25 LAB — TSH: TSH: 4.71 u[IU]/mL (ref 0.35–5.50)

## 2022-06-25 NOTE — Telephone Encounter (Signed)
Diana [name]  (KeyWilliam Dalton - [id number] Amphetamine-Dextroamphetamine 20MG  tablets Status: PA Request Created: May 13th, 2024 [id number] Sent: May 14th, 2024

## 2022-06-25 NOTE — Telephone Encounter (Signed)
This approval authorizes your coverage from 03/27/2022 - 06/25/2023, unless we notify you otherwise, and as long as the following conditions apply: ? you remain enrolled in our Medicare Part D prescription drug plan, ? your physician or other prescriber continues to prescribe the medication for you, and ? the medication continues to be safe for treating your condition.

## 2022-06-26 LAB — DM TEMPLATE

## 2022-06-27 LAB — DRUG MONITORING PANEL 376104, URINE
Alphahydroxyalprazolam: NEGATIVE ng/mL (ref <25)
Alphahydroxymidazolam: NEGATIVE ng/mL (ref <50)
Alphahydroxytriazolam: NEGATIVE ng/mL (ref <50)
Aminoclonazepam: 546 ng/mL — ABNORMAL HIGH (ref <25)
Amphetamine: 3547 ng/mL — ABNORMAL HIGH (ref <250)
Amphetamines: POSITIVE ng/mL — AB (ref <500)
Barbiturates: NEGATIVE ng/mL (ref <300)
Benzodiazepines: POSITIVE ng/mL — AB (ref <100)
Cocaine Metabolite: NEGATIVE ng/mL (ref <150)
Codeine: NEGATIVE ng/mL (ref <50)
Desmethyltramadol: NEGATIVE ng/mL (ref <100)
Hydrocodone: 679 ng/mL — ABNORMAL HIGH (ref <50)
Hydromorphone: 165 ng/mL — ABNORMAL HIGH (ref <50)
Hydroxyethylflurazepam: NEGATIVE ng/mL (ref <50)
Lorazepam: NEGATIVE ng/mL (ref <50)
Methamphetamine: NEGATIVE ng/mL (ref <250)
Morphine: NEGATIVE ng/mL (ref <50)
Nordiazepam: NEGATIVE ng/mL (ref <50)
Norhydrocodone: 1879 ng/mL — ABNORMAL HIGH (ref <50)
Opiates: POSITIVE ng/mL — AB (ref <100)
Oxazepam: NEGATIVE ng/mL (ref <50)
Oxycodone: NEGATIVE ng/mL (ref <100)
Temazepam: NEGATIVE ng/mL (ref <50)
Tramadol: NEGATIVE ng/mL (ref <100)

## 2022-06-30 ENCOUNTER — Other Ambulatory Visit: Payer: Self-pay | Admitting: "Endocrinology

## 2022-07-01 MED ORDER — FREESTYLE LIBRE 2 READER DEVI: 0 refills | Status: DC

## 2022-07-03 ENCOUNTER — Ambulatory Visit: Payer: No Typology Code available for payment source

## 2022-07-03 VITALS — Wt 171.0 lb

## 2022-07-03 DIAGNOSIS — Z0000 Encounter for general adult medical examination without abnormal findings: Secondary | ICD-10-CM | POA: Diagnosis not present

## 2022-07-03 NOTE — Progress Notes (Signed)
I connected with  [name] on 07/03/22 by a audio enabled telemedicine application and verified that I am speaking with the correct person using two identifiers.  Patient Location: Home  Provider Location: Home Office  I discussed the limitations of evaluation and management by telemedicine. The patient expressed understanding and agreed to proceed.   Subjective:   Diana [name] is a 67 y.o. female who presents for Medicare Annual (Subsequent) preventive examination.  Review of Systems     Cardiac Risk Factors include: advanced age (>49men, >73 women);dyslipidemia;sedentary lifestyle     Objective:    Today's Vitals   07/03/22 1450  Weight: 171 lb (77.6 kg)   Body mass index is 29.35 kg/m.     07/03/2022    2:59 PM 04/18/2021    3:16 PM 05/20/2017    3:35 AM 05/19/2017    8:39 PM 12/26/2016    8:43 AM 10/11/2016    1:24 PM 08/14/2015    3:36 AM  Advanced Directives  Does Patient Have a Medical Advance Directive? No No No No No No No  Would patient like information on creating a medical advance directive? No - Patient declined Yes (MAU/Ambulatory/Procedural Areas - Information given) No - Patient declined No - Patient declined Yes (MAU/Ambulatory/Procedural Areas - Information given) Yes (MAU/Ambulatory/Procedural Areas - Information given)     Current Medications (verified) Outpatient Encounter Medications as of 07/03/2022  Medication Sig   amphetamine-dextroamphetamine (ADDERALL) 20 MG tablet Take 1 tablet (20 mg total) by mouth 2 (two) times daily.   atorvastatin (LIPITOR) 20 MG tablet TAKE (1) TABLET BY MOUTH ONCE DAILY.   calcium elemental as carbonate (TUMS ULTRA 1000) 400 MG chewable tablet Chew 1,000 mg by mouth daily.   Cholecalciferol 2000 UNITS CAPS Take 1 capsule (2,000 Units total) by mouth daily. (Patient taking differently: Take 1 capsule by mouth daily. VIT D)   clonazePAM (KLONOPIN) 2 MG tablet TAKE (1) TABLET BY MOUTH (3) TIMES DAILY.    Continuous Blood Gluc Sensor (FREESTYLE LIBRE 2 SENSOR) MISC USE TO TEST BLOOD SUGAR AS DIRECTED. APPLY NEW SENSOR EVERY 14 DAYS.   Continuous Glucose Receiver (FREESTYLE LIBRE 2 READER) DEVI As directed   cyanocobalamin (VITAMIN B12) 1000 MCG/ML injection INJECT INTO THE MUSCLE EVERY 14 DAYS.   cyclobenzaprine (FLEXERIL) 5 MG tablet Take 5 mg by mouth 2 (two) times daily as needed.   DULoxetine (CYMBALTA) 30 MG capsule TAKE (1) CAPSULE BY MOUTH ONCE DAILY.   FERROUS SULFATE PO Take by mouth.   gabapentin (NEURONTIN) 300 MG capsule TAKE 4 CAPSULES BY MOUTH AT BEDTIME.   glucose blood (FREESTYLE PRECISION NEO TEST) test strip Use as instructed   insulin aspart (NOVOLOG FLEXPEN) 100 UNIT/ML FlexPen INJECT 10 UNITS UNDER THE SKIN BEFORE MEALS. (Patient taking differently: Sliding scale)   insulin degludec (TRESIBA FLEXTOUCH) 100 UNIT/ML FlexTouch Pen INJECT 70 UNITS SUBCUTANEOUSLY   Insulin Pen Needle (BD PEN NEEDLE NANO U/F) 32G X 4 MM MISC USE AS DIRECTED TO INJECT INSULIN UP TO 5 TIMES DAILY.   metFORMIN (GLUCOPHAGE-XR) 500 MG 24 hr tablet TAKE (2) TABLET BY MOUTH ONCE DAILY WITH BREAKFAST.   mirtazapine (REMERON) 30 MG tablet TAKE (1) TABLET BY MOUTH AT BEDTIME.   ondansetron (ZOFRAN) 4 MG tablet TAKE 1 TABLET BY MOUTH EVERY 6 HOURS AS NEEDED FOR NAUSEA.   oxyCODONE-acetaminophen (PERCOCET) 10-325 MG tablet Take 1 tablet by mouth every 8 (eight) hours as needed.   [DISCONTINUED] propranolol (INDERAL) 40 MG tablet 1 tab po q6  hours as needed for acute anxiety   No facility-administered encounter medications on file as of 07/03/2022.    Allergies (verified) Aspirin, Latex, Penicillins, Nickel, and Codeine   History: Past Medical History:  Diagnosis Date   Abnormal liver diagnostic imaging 05/20/2017   CT abd->cirrhosis with atypical lesions that MRI f/u determined to be focal regions of severe steatosis.   Abnormal mammogram 12/2014   F/u diagnostic mammo and u/s NORMAL--resume annual  screening mammography   Arthritis    Bipolar disorder (HCC)    questionable   Cervical radiculopathy    Post cerv fusion C4-5, C5-6--stable as of 06/2019 NS f/u.   Chronic fatigue    Chronic nausea 2012/13   Normal upper GI  12/2011 (mild GERD noted +small hiatal hernia)+   Chronic pain syndrome    Chronic C spine pain with radiculopathy.  Dr. Laurian Brim at [facility] was her pain mgmt MD and he discharged her from [facility] pain mgmt center b/c she failed a UDS (+marijuana) 04/27/15. Dr. Dutch Quint referred her to pain mgmt 2021.   Chronic renal insufficiency, stage II (mild) 02/2015   GFR 70s   Colon cancer screening 03/06/2011   iFob 11/2010 through her insurer was negative. iFOB 01/2015 NEG.   Depression    Diabetes mellitus    urine protein-Cr ratio nl 09/2010, D.R. Screen neg 10/2009   Fibromyalgia    Fracture of rib of left side 08/14/2015   5th and 6th   Hepatic steatosis 2005   ultrasound.  Also, 07/2017 MRI abd--> plus a few focal areas of more severe steatosis.  Stable fatty liver on ultrasound 05/2021.   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  Spleen normal 2019.  Stable fatty liver on ultrasound 05/2021   Nephrolithiasis 11/2012   Neurosis, anxiety, panic type    Obesity    s/p bariatric surgery   Osteopenia 12/2010   2012 Hip T score -2.  09/2020 T score -2.2   Other B-complex deficiencies    Panic attacks    Restless legs syndrome    Syncopal episodes    7/73/17 in the setting of dehydration and sedative medication   Tobacco dependence    Vitamin B12 deficiency    Vitamin D deficiency 09/2009   Wears dentures    uppers and lowers   Past  Surgical History:  Procedure Laterality Date   ABDOMINAL HYSTERECTOMY     fibroids, ovaries remain   APPENDECTOMY     BACK SURGERY     BLADDER SURGERY     bladder tack   CERVICAL SPINE SURGERY  09/2009; [id number];9604   2014-ant cerv decomp (Dr. Channing Mutters). 2018 fusion (Dr. Jordan Likes)   CHOLECYSTECTOMY  04/2010   w/lysis of adhesions (open procedure)--path showed chronic cholecystitis and cholesterol polyp.   CT CERVICAL SPINE W CONTRAST  ([facility] HX)  06/2019   No evidence of hardware complication or loosening of her instrumentation.   CYSTOSCOPY/RETROGRADE/URETEROSCOPY/STONE EXTRACTION WITH BASKET Left 11/21/2012   Left UVJ stone.  Procedure: CYSTOSCOPY/RETROGRADE/URETEROSCOPY/STONE EXTRACTION WITH BASKET insertion double j stent;  Surgeon: Kathi Ludwig, MD;  Location: [facility];  Service: Urology;  Laterality: Left;   DEXA  09/2020  T score -2.2. Rpt 2 yrs.   EPIDURAL BLOCK INJECTION  03/2011   Cervical   HERNIA REPAIR  2005   mesenteric hernia repair, with lysis of adhesions (presented with SBO)   POSTERIOR CERVICAL FUSION/FORAMINOTOMY N/A 12/31/2016   Procedure: Posterior Cervical Fusion with lateral mass fixation - Cervical four-five, Cervical five-six;  Surgeon: Julio Sicks, MD;  Location: [facility] OR;  Service: Neurosurgery;  Laterality: N/A;   ROUX-EN-Y GASTRIC BYPASS     TONSILLECTOMY     Family History  Problem Relation Age of Onset   Heart disease Mother    Cancer Mother    Hypertension Mother    Hyperlipidemia Mother    Stroke Mother    Heart disease Father    Hypertension Father    Hyperlipidemia Father    Breast cancer Sister    Cancer Sister    Heart disease Sister    Alzheimer's disease Brother    Kidney disease Brother    Social History   Socioeconomic History   Marital status: Significant Other    Spouse name: Not on file   Number of children: Not on file   Years of education: Not on file   Highest education level: Not on file  Occupational History   Not on file   Tobacco Use   Smoking status: Former    Packs/day: 1.00    Years: 1.00    Additional pack years: 0.00    Total pack years: 1.00    Types: Cigarettes    Quit date: 04/11/2016    Years since quitting: 6.2   Smokeless tobacco: Never  Vaping Use   Vaping Use: Never used  Substance and Sexual Activity   Alcohol use: No   Drug use: No   Sexual activity: Not on file  Other Topics Concern   Not on file  Social History Narrative   Lives with longtime boyfriend in [place], [place].   Had a stepson but he committed suicide 2012/13.   Disabled due to anxiety.   Longtime smoker, quits frequently but restarts.   No alcohol or illegal drugs.   No exercise.   Social Determinants of Health   Financial Resource Strain: Low Risk  (07/03/2022)   Overall Financial Resource Strain (CARDIA)    Difficulty of Paying Living Expenses: Not hard at all  Food Insecurity: No Food Insecurity (07/03/2022)   Hunger Vital Sign    Worried About Running Out of Food in the Last Year: Never true    Ran Out of Food in the Last Year: Never true  Transportation Needs: No Transportation Needs (07/03/2022)   PRAPARE - Administrator, Civil Service (Medical): No    Lack of Transportation (Non-Medical): No  Physical Activity: Insufficiently Active (07/03/2022)   Exercise Vital Sign    Days of Exercise per Week: 7 days    Minutes of Exercise per Session: 20 min  Stress: Stress Concern Present (07/03/2022)   [employer] of Occupational Health - Occupational Stress Questionnaire    Feeling of Stress : Rather much  Social Connections: Socially Isolated (07/03/2022)   Social Connection and Isolation Panel [NHANES]    Frequency of Communication with Friends and Family: Once a week    Frequency of Social Gatherings with Friends and Family: Never    Attends Religious Services: Never    Database administrator or Organizations: No    Attends Banker Meetings: Never    Marital Status: Living with  partner    Tobacco Counseling  Counseling given: Not Answered   Clinical Intake:  Pre-visit preparation completed: Yes  Pain : No/denies pain     BMI - recorded: 29.35 Nutritional Status: BMI 25 -29 Overweight Nutritional Risks: None Diabetes: Yes CBG done?: Yes (74 per pt she was getting ready to eat) CBG resulted in Enter/ Edit results?: No Did pt. bring in CBG monitor from home?: No  How often do you need to have someone help you when you read instructions, pamphlets, or other written materials from your doctor or pharmacy?: 1 - Never  Diabetic?Nutrition Risk Assessment:  Has the patient had any N/V/D within the last 2 months?  Yes  Does the patient have any non-healing wounds?  No  Has the patient had any unintentional weight loss or weight gain?  No   Diabetes:  Is the patient diabetic?  Yes  If diabetic, was a CBG obtained today?  Yes  Did the patient bring in their glucometer from home?  No  How often do you monitor your CBG's? Daily .   Financial Strains and Diabetes Management:  Are you having any financial strains with the device, your supplies or your medication? No .  Does the patient want to be seen by Chronic Care Management for management of their diabetes?  No  Would the patient like to be referred to a Nutritionist or for Diabetic Management?  No   Diabetic Exams:  Diabetic Eye Exam: Overdue for diabetic eye exam. Pt has been advised about the importance in completing this exam. Patient advised to call and schedule an eye exam. Diabetic Foot Exam: Overdue, Pt has been advised about the importance in completing this exam. Pt is scheduled for diabetic foot exam on 06/24/22.   Interpreter Needed?: No  Information entered by :: Lanier Ensign, LPN   Activities of Daily Living    07/03/2022    3:00 PM  In your present state of health, do you have any difficulty performing the following activities:  Hearing? 0  Vision? 0  Difficulty concentrating  or making decisions? 0  Walking or climbing stairs? 0  Dressing or bathing? 0  Doing errands, shopping? 0  Preparing Food and eating ? N  Using the Toilet? N  In the past six months, have you accidently leaked urine? Y  Comment wear a liner  Do you have problems with loss of bowel control? N  Managing your Medications? N  Managing your Finances? N  Housekeeping or managing your Housekeeping? N    Patient Care Team: Jeoffrey Massed, MD as PCP - Ross Marcus, MD as Consulting Physician (Neurosurgery) Renaldo Fiddler, MD as Consulting Physician (Pain Medicine)  Indicate any recent Medical Services you may have received from other than [facility] providers in the past year (date may be approximate).     Assessment:   This is a routine wellness examination for [place].  Hearing/Vision screen Hearing Screening - Comments:: Pt denies any hearing issues  Vision Screening - Comments:: Pt will follow up with new provider   Dietary issues and exercise activities discussed: Current Exercise Habits: Home exercise routine, Type of exercise: walking, Time (Minutes): 20, Frequency (Times/Week): 7, Weekly Exercise (Minutes/Week): 140   Goals Addressed             This Visit's Progress    Patient Stated       To get back to going out        Depression Screen    07/03/2022    2:56 PM 06/24/2022  3:26 PM 05/09/2021    4:11 PM 04/18/2021    3:11 PM 06/27/2020    8:08 AM 03/29/2020    2:14 PM 03/29/2020    1:02 PM  PHQ 2/9 Scores  PHQ - 2 Score 4 6 4 4  3 1   PHQ- 9 Score 12 23 22 10  20    Exception Documentation     Medical reason      Fall Risk    07/03/2022    3:00 PM 06/24/2022    3:26 PM 05/09/2021    4:08 PM 04/18/2021    3:18 PM 03/29/2020    1:00 PM  Fall Risk   Falls in the past year? 1 1 1 1  0  Number falls in past yr: 1 1 1 1  0  Injury with Fall? 0 0 1 1 0  Comment    bumps and bruises   Risk for fall due to : Impaired vision;Impaired balance/gait History of fall(s)  History of fall(s);Impaired balance/gait;Impaired mobility History of fall(s);Impaired balance/gait;Impaired mobility   Follow up Falls prevention discussed Falls evaluation completed Falls evaluation completed Falls prevention discussed Falls prevention discussed    FALL RISK PREVENTION PERTAINING TO THE HOME:  Any stairs in or around the home? Yes  If so, are there any without handrails? No  Home free of loose throw rugs in walkways, pet beds, electrical cords, etc? Yes  Adequate lighting in your home to reduce risk of falls? Yes   ASSISTIVE DEVICES UTILIZED TO PREVENT FALLS:  Life alert? No  Use of a cane, walker or w/c? No  Grab bars in the bathroom? Yes  Shower chair or bench in shower? No  Elevated toilet seat or a handicapped toilet? No   TIMED UP AND GO:  Was the test performed? No .   Cognitive Function:        07/03/2022    3:01 PM 04/18/2021    3:22 PM  6CIT Screen  What Year? 0 points 0 points  What month? 0 points 0 points  What time? 0 points 3 points  Count back from 20 0 points 0 points  Months in reverse 0 points 0 points  Repeat phrase 0 points 2 points  Total Score 0 points 5 points    Immunizations Immunization History  Administered Date(s) Administered   Influenza Split 11/02/2010   Influenza Whole 02/11/2010   Influenza,inj,Quad PF,6+ Mos 11/19/2012, 10/20/2014, 11/10/2015, 01/01/2017, 11/30/2021   Influenza,inj,quad, With Preservative 01/16/2018   Janssen (J&J) SARS-COV-2 Vaccination 05/05/2019, 12/29/2019   PNEUMOCOCCAL CONJUGATE-20 06/24/2022   Pneumococcal Polysaccharide-23 11/10/2015   Td 02/12/2003   Tdap 06/16/2013   Zoster Recombinat (Shingrix) 06/15/2019, 09/11/2019    TDAP status: Up to date  Flu Vaccine status: Up to date  Pneumococcal vaccine status: Up to date  Covid-19 vaccine status: Completed vaccines  Qualifies for Shingles Vaccine? Yes   Zostavax completed Yes   Shingrix Completed?: Yes  Screening Tests Health  Maintenance  Topic Date Due   Fecal DNA (Cologuard)  Never done   OPHTHALMOLOGY EXAM  12/19/2013   COVID-19 Vaccine (3 - 2023-24 season) 10/12/2021   INFLUENZA VACCINE  09/12/2022   Diabetic kidney evaluation - Urine ACR  12/01/2022   MAMMOGRAM  12/01/2022   HEMOGLOBIN A1C  12/25/2022   DTaP/Tdap/Td (3 - Td or Tdap) 06/17/2023   Diabetic kidney evaluation - eGFR measurement  06/24/2023   FOOT EXAM  06/24/2023   Medicare Annual Wellness (AWV)  07/03/2023   Pneumonia Vaccine 76+ Years old  Completed   DEXA SCAN  Completed   Hepatitis C Screening  Completed   HIV Screening  Completed   Zoster Vaccines- Shingrix  Completed   HPV VACCINES  Aged Out    Health Maintenance  Health Maintenance Due  Topic Date Due   Fecal DNA (Cologuard)  Never done   OPHTHALMOLOGY EXAM  12/19/2013   COVID-19 Vaccine (3 - 2023-24 season) 10/12/2021    Pt has cologuard   Mammogram status: Completed 11/30/21. Repeat every year  Bone Density status: Completed 09/14/20. Results reflect: Bone density results: OSTEOPENIA. Repeat every 2 years.   Additional Screening:  Hepatitis C Screening:  Completed 07/09/17  Vision Screening: Recommended annual ophthalmology exams for early detection of glaucoma and other disorders of the eye. Is the patient up to date with their annual eye exam?  No  Who is the provider or what is the name of the office in which the patient attends annual eye exams? Will follow up with new provider  If pt is not established with a provider, would they like to be referred to a provider to establish care? No .   Dental Screening: Recommended annual dental exams for proper oral hygiene  Community Resource Referral / Chronic Care Management: CRR required this visit?  No   CCM required this visit?  No      Plan:     I have personally reviewed and noted the following in the patient's chart:   Medical and social history Use of alcohol, tobacco or illicit drugs  Current  medications and supplements including opioid prescriptions. Patient is currently taking opioid prescriptions. Information provided to patient regarding non-opioid alternatives. Patient advised to discuss non-opioid treatment plan with their provider. Functional ability and status Nutritional status Physical activity Advanced directives List of other physicians Hospitalizations, surgeries, and ER visits in previous 12 months Vitals Screenings to include cognitive, depression, and falls Referrals and appointments  In addition, I have reviewed and discussed with patient certain preventive protocols, quality metrics, and best practice recommendations. A written personalized care plan for preventive services as well as general preventive health recommendations were provided to patient.     Marzella Schlein, LPN   08/10/5282   Nurse Notes: none

## 2022-07-03 NOTE — Patient Instructions (Signed)
Diana [name] , Thank you for taking time to come for your Medicare Wellness Visit. I appreciate your ongoing commitment to your health goals. Please review the following plan we discussed and let me know if I can assist you in the future.   These are the goals we discussed:  Goals      Patient Stated     Try to get out of the house alone once a week     Patient Stated     Get blood sugar lowered and lose weight      Patient Stated     To get back to going out      Weight (lb) < 150 lb (68 kg)     Lose weight by decreasing carbs.         This is a list of the screening recommended for you and due dates:  Health Maintenance  Topic Date Due   Cologuard (Stool DNA test)  Never done   Eye exam for diabetics  12/19/2013   COVID-19 Vaccine (3 - 2023-24 season) 10/12/2021   Flu Shot  09/12/2022   Yearly kidney health urinalysis for diabetes  12/01/2022   Mammogram  12/01/2022   Hemoglobin A1C  12/25/2022   DTaP/Tdap/Td vaccine (3 - Td or Tdap) 06/17/2023   Yearly kidney function blood test for diabetes  06/24/2023   Complete foot exam   06/24/2023   Medicare Annual Wellness Visit  07/03/2023   Pneumonia Vaccine  Completed   DEXA scan (bone density measurement)  Completed   Hepatitis C Screening: USPSTF Recommendation to screen - Ages 2-79 yo.  Completed   HIV Screening  Completed   Zoster (Shingles) Vaccine  Completed   HPV Vaccine  Aged Out    Advanced directives: Advance directive discussed with you today. Even though you declined this today please call our office should you change your mind and we can give you the proper paperwork for you to fill out.  Conditions/risks identified: to get back to going out   Next appointment: Follow up in one year for your annual wellness visit    Preventive Care 65 Years and Older, Female Preventive care refers to lifestyle choices and visits with your health care provider that can promote health and wellness. What does preventive care  include? A yearly physical exam. This is also called an annual well check. Dental exams once or twice a year. Routine eye exams. Ask your health care provider how often you should have your eyes checked. Personal lifestyle choices, including: Daily care of your teeth and gums. Regular physical activity. Eating a healthy diet. Avoiding tobacco and drug use. Limiting alcohol use. Practicing safe sex. Taking low-dose aspirin every day. Taking vitamin and mineral supplements as recommended by your health care provider. What happens during an annual well check? The services and screenings done by your health care provider during your annual well check will depend on your age, overall health, lifestyle risk factors, and family history of disease. Counseling  Your health care provider may ask you questions about your: Alcohol use. Tobacco use. Drug use. Emotional well-being. Home and relationship well-being. Sexual activity. Eating habits. History of falls. Memory and ability to understand (cognition). Work and work Astronomer. Reproductive health. Screening  You may have the following tests or measurements: Height, weight, and BMI. Blood pressure. Lipid and cholesterol levels. These may be checked every 5 years, or more frequently if you are over 28 years old. Skin check. Lung cancer screening. You may  have this screening every year starting at age 57 if you have a 30-pack-year history of smoking and currently smoke or have quit within the past 15 years. Fecal occult blood test (FOBT) of the stool. You may have this test every year starting at age 70. Flexible sigmoidoscopy or colonoscopy. You may have a sigmoidoscopy every 5 years or a colonoscopy every 10 years starting at age 67. Hepatitis C blood test. Hepatitis B blood test. Sexually transmitted disease (STD) testing. Diabetes screening. This is done by checking your blood sugar (glucose) after you have not eaten for a while  (fasting). You may have this done every 1-3 years. Bone density scan. This is done to screen for osteoporosis. You may have this done starting at age 57. Mammogram. This may be done every 1-2 years. Talk to your health care provider about how often you should have regular mammograms. Talk with your health care provider about your test results, treatment options, and if necessary, the need for more tests. Vaccines  Your health care provider may recommend certain vaccines, such as: Influenza vaccine. This is recommended every year. Tetanus, diphtheria, and acellular pertussis (Tdap, Td) vaccine. You may need a Td booster every 10 years. Zoster vaccine. You may need this after age 39. Pneumococcal 13-valent conjugate (PCV13) vaccine. One dose is recommended after age 43. Pneumococcal polysaccharide (PPSV23) vaccine. One dose is recommended after age 24. Talk to your health care provider about which screenings and vaccines you need and how often you need them. This information is not intended to replace advice given to you by your health care provider. Make sure you discuss any questions you have with your health care provider. Document Released: 02/24/2015 Document Revised: 10/18/2015 Document Reviewed: 11/29/2014 Elsevier Interactive Patient Education  2017 [employer].  Fall Prevention in the Home Falls can cause injuries. They can happen to people of all ages. There are many things you can do to make your home safe and to help prevent falls. What can I do on the outside of my home? Regularly fix the edges of walkways and driveways and fix any cracks. Remove anything that might make you trip as you walk through a door, such as a raised step or threshold. Trim any bushes or trees on the path to your home. Use bright outdoor lighting. Clear any walking paths of anything that might make someone trip, such as rocks or tools. Regularly check to see if handrails are loose or broken. Make sure that  both sides of any steps have handrails. Any raised decks and porches should have guardrails on the edges. Have any leaves, snow, or ice cleared regularly. Use sand or salt on walking paths during winter. Clean up any spills in your garage right away. This includes oil or grease spills. What can I do in the bathroom? Use night lights. Install grab bars by the toilet and in the tub and shower. Do not use towel bars as grab bars. Use non-skid mats or decals in the tub or shower. If you need to sit down in the shower, use a plastic, non-slip stool. Keep the floor dry. Clean up any water that spills on the floor as soon as it happens. Remove soap buildup in the tub or shower regularly. Attach bath mats securely with double-sided non-slip rug tape. Do not have throw rugs and other things on the floor that can make you trip. What can I do in the bedroom? Use night lights. Make sure that you have a light  by your bed that is easy to reach. Do not use any sheets or blankets that are too big for your bed. They should not hang down onto the floor. Have a firm chair that has side arms. You can use this for support while you get dressed. Do not have throw rugs and other things on the floor that can make you trip. What can I do in the kitchen? Clean up any spills right away. Avoid walking on wet floors. Keep items that you use a lot in easy-to-reach places. If you need to reach something above you, use a strong step stool that has a grab bar. Keep electrical cords out of the way. Do not use floor polish or wax that makes floors slippery. If you must use wax, use non-skid floor wax. Do not have throw rugs and other things on the floor that can make you trip. What can I do with my stairs? Do not leave any items on the stairs. Make sure that there are handrails on both sides of the stairs and use them. Fix handrails that are broken or loose. Make sure that handrails are as long as the stairways. Check  any carpeting to make sure that it is firmly attached to the stairs. Fix any carpet that is loose or worn. Avoid having throw rugs at the top or bottom of the stairs. If you do have throw rugs, attach them to the floor with carpet tape. Make sure that you have a light switch at the top of the stairs and the bottom of the stairs. If you do not have them, ask someone to add them for you. What else can I do to help prevent falls? Wear shoes that: Do not have high heels. Have rubber bottoms. Are comfortable and fit you well. Are closed at the toe. Do not wear sandals. If you use a stepladder: Make sure that it is fully opened. Do not climb a closed stepladder. Make sure that both sides of the stepladder are locked into place. Ask someone to hold it for you, if possible. Clearly mark and make sure that you can see: Any grab bars or handrails. First and last steps. Where the edge of each step is. Use tools that help you move around (mobility aids) if they are needed. These include: Canes. Walkers. Scooters. Crutches. Turn on the lights when you go into a dark area. Replace any light bulbs as soon as they burn out. Set up your furniture so you have a clear path. Avoid moving your furniture around. If any of your floors are uneven, fix them. If there are any pets around you, be aware of where they are. Review your medicines with your doctor. Some medicines can make you feel dizzy. This can increase your chance of falling. Ask your doctor what other things that you can do to help prevent falls. This information is not intended to replace advice given to you by your health care provider. Make sure you discuss any questions you have with your health care provider. Document Released: 11/24/2008 Document Revised: 07/06/2015 Document Reviewed: 03/04/2014 Elsevier Interactive Patient Education  2017 [employer].

## 2022-07-11 ENCOUNTER — Telehealth: Payer: Self-pay

## 2022-07-11 NOTE — Telephone Encounter (Signed)
LVM for pt regarding referral for eye doctor and completing cologuard. Pt had a referral placed during her last visit on 5/13. Pt also had cologuard ordered 03/29/20 but was not completed.   We would like to confirm if she would like a new order placed or is interested in another option, iFOB (kit given at our office to complete at home) or colonoscopy (referral needed to GI)

## 2022-07-12 ENCOUNTER — Telehealth: Payer: Self-pay | Admitting: Family Medicine

## 2022-07-12 NOTE — Telephone Encounter (Signed)
Patient called back to check status of her request for Dr. Milinda Cave to call in something for her for bladder infection.  I told her she needed to make an appt that Dr. Milinda Cave could call an antibiotic for her without testing urine.  He is unsure of what to treat.  She said "I know what a UTI is, I know what a yeast infection is and I have a UTI!" I apologized to her and told she would have to come in. She said she was "tired of this mess and it was time to leave" and was going to find her a new doctor, and slammed the phone down. Call was disconnected.

## 2022-07-12 NOTE — Telephone Encounter (Addendum)
Please assist patient with scheduling an appt, thanks.  [facility] Day - Client TELEPHONE ADVICE RECORD AccessNurse Patient Name First: Diana Last: [name] Gender: Female DOB: November 07, 1955 Age: 67 Y 6 M 5 D Return Phone Number: [phone] (Primary), [phone] (Secondary) Address: [name] [address] Client [place] [facility] Day - Client Client Site [facility] - Day Provider Santiago Bumpers - MD Contact Type Call Who Is Calling Patient / Member / Family / Caregiver Call Type Triage / Clinical Relationship To Patient Self Return Phone Number [phone] (Primary) Chief Complaint Vaginal Pain Reason for Call Symptomatic / Request for Health Information Initial Comment The caller states she has a bladder a infection and she is needing rx to clear it up. The pt is experiencing vaginal pain before and after urination and urination frequency. Translation No Nurse Assessment Nurse: Kizzie Bane, RN, Marylene Land Date/Time Lamount Cohen Time): 07/12/2022 12:08:53 PM Confirm and document reason for call. If symptomatic, describe symptoms. ---Caller states she is having pain with urination and frequency that started about 3 days ago. No fever Does the patient have any new or worsening symptoms? ---Yes Will a triage be completed? ---Yes Related visit to physician within the last 2 weeks? ---No Does the PT have any chronic conditions? (i.e. diabetes, asthma, this includes High risk factors for pregnancy, etc.) ---Yes List chronic conditions. ---diabetes Is this a behavioral health or substance abuse call? ---No Guidelines Guideline Title Affirmed Question Affirmed Notes Nurse Date/Time (Eastern Time) Urination Pain - Female Diabetes mellitus or weak immune system (e.g., HIV positive, cancer chemo, splenectomy, organ transplant, chronic steroids) Kizzie Bane, RN, Marylene Land 07/12/2022 12:10:21 PM PLEASE NOTE: All  timestamps contained within this report are represented as [place] Standard Time. CONFIDENTIALTY NOTICE: This fax transmission is intended only for the addressee. It contains information that is legally privileged, confidential or otherwise protected from use or disclosure. If you are not the intended recipient, you are strictly prohibited from reviewing, disclosing, copying using or disseminating any of this information or taking any action in reliance on or regarding this information. If you have received this fax in error, please notify us immediately by telephone so that we can arrange for its return to [place]. Phone: [phone], Toll-Free: [phone], Fax: [phone] Page: 2 of 2 Call Id: [id number] Disp. Time Lamount Cohen Time) Disposition Final User 07/12/2022 12:14:27 PM See HCP within 4 Hours (or PCP triage) Yes Kizzie Bane, RN, Marylene Land Final Disposition 07/12/2022 12:14:27 PM See HCP within 4 Hours (or PCP triage) Yes Kizzie Bane, RN, Rosalyn Charters Disagree/Comply Comply Caller Understands Yes PreDisposition Did not know what to do Care Advice Given Per Guideline SEE HCP (OR PCP TRIAGE) WITHIN 4 HOURS: * IF OFFICE WILL BE OPEN: You need to be seen within the next 3 or 4 hours. Call your doctor (or NP/PA) now or as soon as the office opens. CALL BACK IF: * You become worse CARE ADVICE given per Urination Pain - Female (Adult) guideline. Referrals GO TO FACILITY UNDECIDED

## 2022-07-12 NOTE — Telephone Encounter (Signed)
Patient called during lunch and reached triage. She is currently experiencing symptoms of a bladder infection. Please give the patient a call to discuss options.

## 2022-08-08 ENCOUNTER — Other Ambulatory Visit: Payer: Self-pay | Admitting: Family Medicine

## 2022-08-08 ENCOUNTER — Other Ambulatory Visit: Payer: Self-pay | Admitting: "Endocrinology

## 2022-08-08 NOTE — Telephone Encounter (Signed)
RF request for gabapentin LOV: 06/24/22 Next ov: 09/25/22 Last written: 06/24/22 (120,0)   Requesting: adderall Contract: 06/24/22 UDS: 06/24/22 Last Visit: 06/24/22 Next Visit: 09/25/22 Last Refill: 06/24/22 (60,0)  RF request for zofran LOV: 06/24/22 Next ov: 09/25/22 Last written: 01/03/21 (30,1)  Please advise. Meds pending

## 2022-08-12 ENCOUNTER — Other Ambulatory Visit: Payer: Self-pay

## 2022-08-12 NOTE — Telephone Encounter (Signed)
RF request for Gabapentin LOV: 06/24/22 Next ov:  09/25/22 Last written: 08/09/22 (120,2)

## 2022-09-04 NOTE — Progress Notes (Signed)
[facility] Quality Team Note  Name: Diana [name] Date of Birth: 11-28-1955 MRN: [id number] Date: 09/04/2022  [facility] Quality Team has reviewed this patient's chart, please see recommendations below:  [facility] Quality Other; (EED- Called to offer [facility] Event 09/17/2022, left voicemail.)

## 2022-09-11 ENCOUNTER — Encounter (INDEPENDENT_AMBULATORY_CARE_PROVIDER_SITE_OTHER): Payer: Self-pay

## 2022-09-11 ENCOUNTER — Other Ambulatory Visit: Payer: Self-pay | Admitting: Family Medicine

## 2022-09-12 NOTE — Telephone Encounter (Signed)
Adderall refill requested  Next OV 8/14

## 2022-09-25 ENCOUNTER — Ambulatory Visit: Payer: No Typology Code available for payment source | Admitting: Family Medicine

## 2022-09-25 ENCOUNTER — Other Ambulatory Visit: Payer: Self-pay | Admitting: Family Medicine

## 2022-09-26 ENCOUNTER — Encounter (INDEPENDENT_AMBULATORY_CARE_PROVIDER_SITE_OTHER): Payer: Self-pay

## 2022-10-10 ENCOUNTER — Other Ambulatory Visit: Payer: Self-pay | Admitting: Family Medicine

## 2022-11-06 ENCOUNTER — Ambulatory Visit: Payer: No Typology Code available for payment source | Admitting: Family Medicine

## 2022-11-11 ENCOUNTER — Other Ambulatory Visit: Payer: Self-pay | Admitting: Family Medicine

## 2022-11-12 NOTE — Telephone Encounter (Addendum)
Requesting: amphetamine-dextroamphetamine (ADDERALL) 20 MG tablet  Contract: 06/24/22 UDS: 06/24/22 Last Visit: 06/24/22 Next Visit: 12/04/22 Last Refill: 09/12/22 (60,0)   RF request for cyanocobalamin (VITAMIN B12) 1000 MCG/ML injection  LOV: 06/24/22 Next ov: 12/04/22 Last written: 06/24/22 (97ml,0)  RF request for gabapentin (NEURONTIN) 300 MG capsule  LOV: 06/24/22 Next ov: 12/04/22 Last written: 08/09/22 (120,2)   Please Advise. Meds pending

## 2022-11-20 ENCOUNTER — Ambulatory Visit: Payer: No Typology Code available for payment source | Admitting: Family Medicine

## 2022-11-23 ENCOUNTER — Other Ambulatory Visit: Payer: Self-pay | Admitting: Family Medicine

## 2022-12-04 ENCOUNTER — Ambulatory Visit: Payer: No Typology Code available for payment source | Admitting: Family Medicine

## 2022-12-04 DIAGNOSIS — E119 Type 2 diabetes mellitus without complications: Secondary | ICD-10-CM

## 2022-12-04 DIAGNOSIS — E7800 Pure hypercholesterolemia, unspecified: Secondary | ICD-10-CM

## 2022-12-04 DIAGNOSIS — F3181 Bipolar II disorder: Secondary | ICD-10-CM

## 2022-12-04 DIAGNOSIS — F411 Generalized anxiety disorder: Secondary | ICD-10-CM

## 2022-12-04 NOTE — Progress Notes (Deleted)
OFFICE VISIT  12/04/2022  CC: No chief complaint on file.   Patient is a 67 y.o. female who presents for 62-month follow-up anxiety, mood disorder, adult ADD, and diabetes. A/P as of last visit: "1 type 2 diabetes without complication.  Erratic glucoses but overall good control. She states she no longer follows up with her endocrinologist. POC A1c today is 6.8%. Discussed appropriate dietary habits and insulin titration to avoid severe hypoglycemia.  Continue metformin XR 1000 mg a day. Feet exam normal today. Ophthalmology referral today.   #2 Mood disorder NOS, GAD, agoraphobia, adult ADD. Continue duloxetine 30 mg a day and Remeron 30 mg nightly.  Continue clonazepam 2 mg 3 times daily.  Increase Adderall to 20 mg twice a day. Controlled substance contract updated. Urine drug screen today.   #3 malabsorption syndrome-> remote history of f Roux-en-Y gastric bypass surgery. Continue vitamin B12 and vitamin D supplement. Check B12, vitamin D, and iron levels today.   #4 hypercholesterolemia, doing well on atorvastatin 20 mg a day. Lipid panel and hepatic panel today."  INTERIM HX: Labs last visit all normal/excellent.  ***   PMP AWARE reviewed today: most recent rx for Adderall 20 mg was filled 11/12/2022, # 60, rx by me.  Most recent clonazepam 2 mg prescription filled 09/25/2022, #90, prescription by me. Most recent gabapentin 300 mg prescription was filled 11/12/22, #120, prescription by me. No red flags.   Past Medical History:  Diagnosis Date   Abnormal liver diagnostic imaging 05/20/2017   CT abd->cirrhosis with atypical lesions that MRI f/u determined to be focal regions of severe steatosis.   Abnormal mammogram 12/2014   F/u diagnostic mammo and u/s NORMAL--resume annual screening mammography   Arthritis    Bipolar disorder (HCC)    questionable   Cervical radiculopathy    Post cerv fusion C4-5, C5-6--stable as of 06/2019 NS f/u.   Chronic fatigue    Chronic  nausea 2012/13   Normal upper GI  12/2011 (mild GERD noted +small hiatal hernia)+   Chronic pain syndrome    Chronic C spine pain with radiculopathy.  Dr. Laurian Brim at [facility] was her pain mgmt MD and he discharged her from [facility] pain mgmt center b/c she failed a UDS (+marijuana) 04/27/15. Dr. Dutch Quint referred her to pain mgmt 2021.   Chronic renal insufficiency, stage II (mild) 02/2015   GFR 70s   Colon cancer screening 03/06/2011   iFob 11/2010 through her insurer was negative. iFOB 01/2015 NEG.   Depression    Diabetes mellitus    urine protein-Cr ratio nl 09/2010, D.R. Screen neg 10/2009   Fibromyalgia    Fracture of rib of left side 08/14/2015   5th and 6th   Hepatic steatosis 2005   ultrasound.  Also, 07/2017 MRI abd--> plus a few focal areas of more severe steatosis.  Stable fatty liver on ultrasound 05/2021.   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  Spleen normal 2019.  Stable fatty liver on ultrasound 05/2021   Nephrolithiasis 11/2012   Neurosis, anxiety, panic type    Obesity    s/p bariatric surgery   Osteopenia 12/2010   2012 Hip T score -  2.  09/2020 T score -2.2   Other B-complex deficiencies    Panic attacks    Restless legs syndrome    Syncopal episodes    7/73/17 in the setting of dehydration and sedative medication   Tobacco dependence    Vitamin B12 deficiency    Vitamin D deficiency 09/2009   Wears dentures    uppers and lowers    Past Surgical History:  Procedure Laterality Date   ABDOMINAL HYSTERECTOMY     fibroids, ovaries remain   APPENDECTOMY     BACK SURGERY     BLADDER SURGERY     bladder tack   CERVICAL SPINE  SURGERY  09/2009; [id number]   2014-ant cerv decomp (Dr. Channing Mutters). 2018 fusion (Dr. Jordan Likes)   CHOLECYSTECTOMY  04/2010   w/lysis of adhesions (open procedure)--path showed chronic cholecystitis and cholesterol polyp.   CT CERVICAL SPINE W CONTRAST  ([facility] HX)  06/2019   No evidence of hardware complication or loosening of her instrumentation.   CYSTOSCOPY/RETROGRADE/URETEROSCOPY/STONE EXTRACTION WITH BASKET Left 11/21/2012   Left UVJ stone.  Procedure: CYSTOSCOPY/RETROGRADE/URETEROSCOPY/STONE EXTRACTION WITH BASKET insertion double j stent;  Surgeon: Kathi Ludwig, MD;  Location: [facility];  Service: Urology;  Laterality: Left;   DEXA  09/2020   T score -2.2. Rpt 2 yrs.   EPIDURAL BLOCK INJECTION  03/2011   Cervical   HERNIA REPAIR  2005   mesenteric hernia repair, with lysis of adhesions (presented with SBO)   POSTERIOR CERVICAL FUSION/FORAMINOTOMY N/A 12/31/2016   Procedure: Posterior Cervical Fusion with lateral mass fixation - Cervical four-five, Cervical five-six;  Surgeon: Julio Sicks, MD;  Location: [facility] OR;  Service: Neurosurgery;  Laterality: N/A;   ROUX-EN-Y GASTRIC BYPASS     TONSILLECTOMY      Outpatient Medications Prior to Visit  Medication Sig Dispense Refill   amphetamine-dextroamphetamine (ADDERALL) 20 MG tablet Take 1 tablet (20 mg total) by mouth 2 (two) times daily. 60 tablet 0   atorvastatin (LIPITOR) 20 MG tablet TAKE (1) TABLET BY MOUTH ONCE DAILY. 90 tablet 1   calcium elemental as carbonate (TUMS ULTRA 1000) 400 MG chewable tablet Chew 1,000 mg by mouth daily.     Cholecalciferol 2000 UNITS CAPS Take 1 capsule (2,000 Units total) by mouth daily. (Patient taking differently: Take 1 capsule by mouth daily. VIT D) 30 each 11   clonazePAM (KLONOPIN) 2 MG tablet TAKE (1) TABLET BY MOUTH (3) TIMES DAILY. 90 tablet 5   Continuous Glucose Receiver (FREESTYLE LIBRE 2 READER) DEVI As directed 1 each 0   Continuous Glucose Sensor (FREESTYLE LIBRE 2 SENSOR) MISC USE TO TEST BLOOD  SUGAR AS DIRECTED. APPLY NEW SENSOR EVERY 14 DAYS. 2 each 3   cyanocobalamin (VITAMIN B12) 1000 MCG/ML injection INJECT INTO THE MUSCLE EVERY 14 DAYS. 4 mL 4   cyclobenzaprine (FLEXERIL) 5 MG tablet Take 5 mg by mouth 2 (two) times daily as needed.     DULoxetine (CYMBALTA) 30 MG capsule TAKE (1) CAPSULE BY MOUTH ONCE DAILY. 90 capsule 1   FERROUS SULFATE PO Take by mouth.     gabapentin (NEURONTIN) 300 MG capsule TAKE 4 CAPSULES BY MOUTH AT BEDTIME. 120 capsule 5   glucose blood (FREESTYLE PRECISION NEO TEST) test strip Use as instructed 100 each 2   HYDROcodone-acetaminophen (NORCO) 10-325 MG tablet Take 1 tablet by mouth 3 (three) times daily as needed.     insulin aspart (NOVOLOG FLEXPEN) 100 UNIT/ML FlexPen INJECT 10 UNITS UNDER THE SKIN BEFORE MEALS. (Patient taking differently:  Sliding scale) 15 mL 0   Insulin Pen Needle (BD PEN NEEDLE NANO U/F) 32G X 4 MM MISC USE AS DIRECTED TO INJECT INSULIN UP TO 5 TIMES DAILY. 200 each 1   metFORMIN (GLUCOPHAGE-XR) 500 MG 24 hr tablet TAKE (2) TABLET BY MOUTH ONCE DAILY WITH BREAKFAST. 180 tablet 1   mirtazapine (REMERON) 30 MG tablet TAKE (1) TABLET BY MOUTH AT BEDTIME. 90 tablet 1   ondansetron (ZOFRAN) 4 MG tablet TAKE 1 TABLET BY MOUTH EVERY 6 HOURS AS NEEDED FOR NAUSEA. 60 tablet 1   oxyCODONE-acetaminophen (PERCOCET) 10-325 MG tablet Take 1 tablet by mouth every 8 (eight) hours as needed.     TRESIBA FLEXTOUCH 100 UNIT/ML FlexTouch Pen INJECT 70 UNITS SUBCUTANEOUSLY 15 mL 0   No facility-administered medications prior to visit.    Allergies  Allergen Reactions   Aspirin Other (See Comments)    Gastric Bypass   Latex Other (See Comments)    blisters   Penicillins     UNSPECIFIED REACTION >"Took large quantities as a child & told to never take again Has patient had a PCN reaction causing immediate rash, facial/tongue/throat swelling, SOB or lightheadedness with hypotension: No Has patient had a PCN reaction causing severe rash  involving mucus membranes or skin necrosis: No Has patient had a PCN reaction that required hospitalization: No Has patient had a PCN reaction occurring within the last 10 years: No If all of the above answers are "NO", then may proceed wi   Nickel    Codeine Itching    Review of Systems As per HPI  PE:    07/03/2022    2:50 PM 06/24/2022    3:02 PM 11/30/2021   10:41 AM  Vitals with BMI  Height  5\' 4"  5\' 2"   Weight 171 lbs 171 lbs 3 oz 172 lbs  BMI 29.34 29.37 31.45  Systolic  138 143  Diastolic  85 79  Pulse  98 93     Physical Exam  ***  LABS:  Last CBC Lab Results  Component Value Date   WBC 8.0 06/24/2022   HGB 13.1 06/24/2022   HCT 39.6 06/24/2022   MCV 95.5 06/24/2022   MCH 31.0 05/22/2017   RDW 12.9 06/24/2022   PLT 291.0 06/24/2022   Last metabolic panel Lab Results  Component Value Date   GLUCOSE 79 06/24/2022   NA 140 06/24/2022   K 4.9 06/24/2022   CL 102 06/24/2022   CO2 29 06/24/2022   BUN 14 06/24/2022   CREATININE 0.84 06/24/2022   GFR 72.90 06/24/2022   CALCIUM 9.5 06/24/2022   PROT 7.5 06/24/2022   ALBUMIN 4.2 06/24/2022   BILITOT 0.4 06/24/2022   ALKPHOS 92 06/24/2022   AST 34 06/24/2022   ALT 28 06/24/2022   ANIONGAP 9 05/22/2017   Last lipids Lab Results  Component Value Date   CHOL 150 06/24/2022   HDL 50.30 06/24/2022   LDLCALC 79 06/24/2022   LDLDIRECT 174.0 08/08/2015   TRIG 105.0 06/24/2022   CHOLHDL 3 06/24/2022   Last hemoglobin A1c Lab Results  Component Value Date   HGBA1C 6.8 (A) 06/24/2022   HGBA1C 6.8 06/24/2022   HGBA1C 6.8 (A) 06/24/2022   HGBA1C 6.8 06/24/2022   Last thyroid functions Lab Results  Component Value Date   TSH 4.71 06/24/2022   T3TOTAL 88.2 10/11/2016   Last vitamin D Lab Results  Component Value Date   VD25OH 52.18 06/24/2022   Last vitamin B12 and Folate Lab Results  Component Value Date   VITAMINB12 720 06/24/2022   IMPRESSION AND PLAN:  No problem-specific Assessment  & Plan notes found for this encounter.   An After Visit Summary was printed and given to the patient.  FOLLOW UP: No follow-ups on file. Next CPE 06/2023 Signed:  Santiago Bumpers, MD           12/04/2022

## 2022-12-06 ENCOUNTER — Ambulatory Visit (INDEPENDENT_AMBULATORY_CARE_PROVIDER_SITE_OTHER): Payer: No Typology Code available for payment source | Admitting: Family Medicine

## 2022-12-06 ENCOUNTER — Encounter: Payer: Self-pay | Admitting: Family Medicine

## 2022-12-06 VITALS — BP 125/84 | HR 107 | Wt 173.6 lb

## 2022-12-06 DIAGNOSIS — Z23 Encounter for immunization: Secondary | ICD-10-CM

## 2022-12-06 DIAGNOSIS — E119 Type 2 diabetes mellitus without complications: Secondary | ICD-10-CM

## 2022-12-06 DIAGNOSIS — F4000 Agoraphobia, unspecified: Secondary | ICD-10-CM

## 2022-12-06 DIAGNOSIS — Z794 Long term (current) use of insulin: Secondary | ICD-10-CM | POA: Diagnosis not present

## 2022-12-06 DIAGNOSIS — Z7984 Long term (current) use of oral hypoglycemic drugs: Secondary | ICD-10-CM

## 2022-12-06 DIAGNOSIS — F988 Other specified behavioral and emotional disorders with onset usually occurring in childhood and adolescence: Secondary | ICD-10-CM

## 2022-12-06 DIAGNOSIS — F411 Generalized anxiety disorder: Secondary | ICD-10-CM

## 2022-12-06 DIAGNOSIS — F39 Unspecified mood [affective] disorder: Secondary | ICD-10-CM | POA: Diagnosis not present

## 2022-12-06 LAB — POCT GLYCOSYLATED HEMOGLOBIN (HGB A1C)
HbA1c POC (<> result, manual entry): 6.8 % (ref 4.0–5.6)
HbA1c, POC (controlled diabetic range): 6.8 % (ref 0.0–7.0)
HbA1c, POC (prediabetic range): 6.8 % — AB (ref 5.7–6.4)
Hemoglobin A1C: 6.8 % — AB (ref 4.0–5.6)

## 2022-12-06 LAB — MICROALBUMIN / CREATININE URINE RATIO
Creatinine,U: 137.9 mg/dL
Microalb Creat Ratio: 1.6 mg/g (ref 0.0–30.0)
Microalb, Ur: 2.3 mg/dL — ABNORMAL HIGH (ref 0.0–1.9)

## 2022-12-06 MED ORDER — GABAPENTIN 300 MG PO CAPS: ORAL_CAPSULE | ORAL | 1 refills | Status: DC

## 2022-12-06 MED ORDER — TRESIBA FLEXTOUCH 100 UNIT/ML ~~LOC~~ SOPN: PEN_INJECTOR | SUBCUTANEOUS | Status: DC

## 2022-12-06 NOTE — Progress Notes (Signed)
OFFICE VISIT  12/06/2022  CC:  Chief Complaint  Patient presents with   Medical Management of Chronic Issues    Pt c/o of knee pain for the last few days. Pt denies any falls or movement that could have caused pain.     Patient is a 67 y.o. female who presents for 53-month follow-up DM, mood d/o, GAD/agoraphobia, and adult ADD. A/P as of last visit: "#1 type 2 diabetes without complication.  Erratic glucoses but overall good control. She states she no longer follows up with her endocrinologist. POC A1c today is 6.8%. Discussed appropriate dietary habits and insulin titration to avoid severe hypoglycemia.  Continue metformin XR 1000 mg a day. Feet exam normal today. Ophthalmology referral today.   #2 Mood disorder NOS, GAD, agoraphobia, adult ADD. Continue duloxetine 30 mg a day and Remeron 30 mg nightly.  Continue clonazepam 2 mg 3 times daily.  Increase Adderall to 20 mg twice a day. Controlled substance contract updated. Urine drug screen today.   #3 malabsorption syndrome-> remote history of f Roux-en-Y gastric bypass surgery. Continue vitamin B12 and vitamin D supplement. Check B12, vitamin D, and iron levels today.   #4 hypercholesterolemia, doing well on atorvastatin 20 mg a day. Lipid panel and hepatic panel today."  INTERIM HX: All lab tests were normal last visit. Her main problem continues to be her extreme agoraphobia. She does describe brief periods of increased depressed mood as well as brief periods of more of a hypomanic state. Adderall at the 20 mg twice a day dosing has improved her focus and concentration and thereby also has helped her mood and anxiety levels.  Glucoses average around 120.  Only occasional hypoglycemia.  Only occasional reading up into the 200s. Currently on 46 units of Tresiba daily and typically takes 10 to 12 units of NovoLog at each meal. Metformin 1000 twice a day as well.  Recently had a COVID illness but is completely over this  now.  PMP AWARE reviewed today: most recent rx for Adderall 20 mg was filled 11/12/2022, # 60, rx by me. Most recent prescription for clonazepam 2 mg was filled 09/25/2022, #90, prescription by me. Most recent prescription for gabapentin 300 mg was filled 11/12/2022, #120, prescription by me. Her pain medication is prescribed by another provider. No red flags.  ROS as above, plus--> no fevers, no CP, no SOB, no wheezing, no cough, no dizziness, no HAs, no rashes, no melena/hematochezia.  No polyuria or polydipsia.  No myalgias or arthralgias.  No focal weakness, paresthesias, or tremors.  No acute vision or hearing abnormalities.  No dysuria or unusual/new urinary urgency or frequency.  No recent changes in lower legs. No n/v/d or abd pain.  No palpitations.    Past Medical History:  Diagnosis Date   Abnormal liver diagnostic imaging 05/20/2017   CT abd->cirrhosis with atypical lesions that MRI f/u determined to be focal regions of severe steatosis.   Abnormal mammogram 12/2014   F/u diagnostic mammo and u/s NORMAL--resume annual screening mammography   Arthritis    Bipolar disorder (HCC)    questionable   Cervical radiculopathy    Post cerv fusion C4-5, C5-6--stable as of 06/2019 NS f/u.   Chronic fatigue    Chronic nausea 2012/13   Normal upper GI  12/2011 (mild GERD noted +small hiatal hernia)+   Chronic pain syndrome    Chronic C spine pain with radiculopathy.  Dr. Laurian Brim at [facility] was her pain mgmt MD and he discharged her from [facility]  pain mgmt center b/c she failed a UDS (+marijuana) 04/27/15. Dr. Dutch Quint referred her to pain mgmt 2021.   Chronic renal insufficiency, stage II (mild) 02/2015   GFR 70s   Colon cancer screening 03/06/2011   iFob 11/2010 through her insurer was negative. iFOB 01/2015 NEG.   Depression    Diabetes mellitus    urine protein-Cr ratio nl 09/2010, D.R. Screen neg 10/2009   Fibromyalgia    Fracture of rib of left side 08/14/2015   5th and 6th   Hepatic  steatosis 2005   ultrasound.  Also, 07/2017 MRI abd--> plus a few focal areas of more severe steatosis.  Stable fatty liver on ultrasound 05/2021.   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  Spleen normal 2019.  Stable fatty liver on ultrasound 05/2021   Nephrolithiasis 11/2012   Neurosis, anxiety, panic type    Obesity    s/p bariatric surgery   Osteopenia 12/2010   2012 Hip T score -2.  09/2020 T score -2.2   Other B-complex deficiencies    Panic attacks    Restless legs syndrome    Syncopal episodes    7/73/17 in the setting of dehydration and sedative medication   Tobacco dependence    Vitamin B12 deficiency    Vitamin D deficiency 09/2009   Wears dentures    uppers and lowers    Past Surgical History:  Procedure Laterality Date   ABDOMINAL HYSTERECTOMY     fibroids, ovaries remain   APPENDECTOMY     BACK SURGERY     BLADDER SURGERY     bladder tack   CERVICAL SPINE SURGERY  09/2009; 4098;1191   2014-ant cerv decomp (Dr. Channing Mutters). 2018 fusion (Dr. Jordan Likes)   CHOLECYSTECTOMY  04/2010   w/lysis of adhesions (open procedure)--path showed chronic cholecystitis and cholesterol polyp.   CT CERVICAL SPINE W CONTRAST  ([facility] HX)  06/2019   No evidence of hardware complication or loosening of her instrumentation.   CYSTOSCOPY/RETROGRADE/URETEROSCOPY/STONE EXTRACTION WITH BASKET Left 11/21/2012   Left UVJ stone.  Procedure: CYSTOSCOPY/RETROGRADE/URETEROSCOPY/STONE EXTRACTION WITH BASKET insertion double j stent;  Surgeon: Kathi Ludwig, MD;  Location: [facility];  Service: Urology;  Laterality: Left;   DEXA  09/2020   T score -2.2. Rpt 2 yrs.    EPIDURAL BLOCK INJECTION  03/2011   Cervical   HERNIA REPAIR  2005   mesenteric hernia repair, with lysis of adhesions (presented with SBO)   POSTERIOR CERVICAL FUSION/FORAMINOTOMY N/A 12/31/2016   Procedure: Posterior Cervical Fusion with lateral mass fixation - Cervical four-five, Cervical five-six;  Surgeon: Julio Sicks, MD;  Location: [facility] OR;  Service: Neurosurgery;  Laterality: N/A;   ROUX-EN-Y GASTRIC BYPASS     TONSILLECTOMY      Outpatient Medications Prior to Visit  Medication Sig Dispense Refill   amphetamine-dextroamphetamine (ADDERALL) 20 MG tablet Take 1 tablet (20 mg total) by mouth 2 (two) times daily. 60 tablet 0   atorvastatin (LIPITOR) 20 MG tablet TAKE (1) TABLET BY MOUTH ONCE DAILY. 90 tablet 1   calcium elemental as carbonate (TUMS ULTRA 1000) 400 MG chewable tablet Chew 1,000 mg by mouth daily.  Cholecalciferol 2000 UNITS CAPS Take 1 capsule (2,000 Units total) by mouth daily. (Patient taking differently: Take 1 capsule by mouth daily. VIT D) 30 each 11   clonazePAM (KLONOPIN) 2 MG tablet TAKE (1) TABLET BY MOUTH (3) TIMES DAILY. 90 tablet 5   Continuous Glucose Receiver (FREESTYLE LIBRE 2 READER) DEVI As directed 1 each 0   Continuous Glucose Sensor (FREESTYLE LIBRE 2 SENSOR) MISC USE TO TEST BLOOD SUGAR AS DIRECTED. APPLY NEW SENSOR EVERY 14 DAYS. 2 each 3   cyanocobalamin (VITAMIN B12) 1000 MCG/ML injection INJECT INTO THE MUSCLE EVERY 14 DAYS. 4 mL 4   cyclobenzaprine (FLEXERIL) 5 MG tablet Take 5 mg by mouth 2 (two) times daily as needed.     DULoxetine (CYMBALTA) 30 MG capsule TAKE (1) CAPSULE BY MOUTH ONCE DAILY. 90 capsule 1   FERROUS SULFATE PO Take by mouth.     glucose blood (FREESTYLE PRECISION NEO TEST) test strip Use as instructed 100 each 2   HYDROcodone-acetaminophen (NORCO) 10-325 MG tablet Take 1 tablet by mouth 3 (three) times daily as needed.     insulin aspart (NOVOLOG FLEXPEN) 100 UNIT/ML FlexPen INJECT 10 UNITS UNDER THE SKIN BEFORE MEALS.  (Patient taking differently: Sliding scale) 15 mL 0   Insulin Pen Needle (BD PEN NEEDLE NANO U/F) 32G X 4 MM MISC USE AS DIRECTED TO INJECT INSULIN UP TO 5 TIMES DAILY. 200 each 1   metFORMIN (GLUCOPHAGE-XR) 500 MG 24 hr tablet TAKE (2) TABLET BY MOUTH ONCE DAILY WITH BREAKFAST. 180 tablet 1   mirtazapine (REMERON) 30 MG tablet TAKE (1) TABLET BY MOUTH AT BEDTIME. 90 tablet 1   ondansetron (ZOFRAN) 4 MG tablet TAKE 1 TABLET BY MOUTH EVERY 6 HOURS AS NEEDED FOR NAUSEA. 60 tablet 1   gabapentin (NEURONTIN) 300 MG capsule TAKE 4 CAPSULES BY MOUTH AT BEDTIME. 120 capsule 5   oxyCODONE-acetaminophen (PERCOCET) 10-325 MG tablet Take 1 tablet by mouth every 8 (eight) hours as needed.     TRESIBA FLEXTOUCH 100 UNIT/ML FlexTouch Pen INJECT 70 UNITS SUBCUTANEOUSLY 15 mL 0   No facility-administered medications prior to visit.    Allergies  Allergen Reactions   Aspirin Other (See Comments)    Gastric Bypass   Latex Other (See Comments)    blisters   Penicillins     UNSPECIFIED REACTION >"Took large quantities as a child & told to never take again Has patient had a PCN reaction causing immediate rash, facial/tongue/throat swelling, SOB or lightheadedness with hypotension: No Has patient had a PCN reaction causing severe rash involving mucus membranes or skin necrosis: No Has patient had a PCN reaction that required hospitalization: No Has patient had a PCN reaction occurring within the last 10 years: No If all of the above answers are "NO", then may proceed wi   Nickel    Codeine Itching    Review of Systems As per HPI  PE:    12/06/2022   10:54 AM 07/03/2022    2:50 PM 06/24/2022    3:02 PM  Vitals with BMI  Height   5\' 4"   Weight 173 lbs 10 oz 171 lbs 171 lbs 3 oz  BMI  29.34 29.37  Systolic 125  138  Diastolic 84  85  Pulse 107  98     Physical Exam  Gen: Alert, well appearing, pale as per her baseline complexion.  Patient is oriented to person, place, time, and  situation. AFFECT: Very anxious, occasionally tearful when talking about her struggles with  anxiety/agoraphobia.  Lucid thought and speech. CV: RRR, no m/r/g.   LUNGS: CTA bilat, nonlabored resps, good aeration in all lung fields. No edema.  LABS:  Last CBC Lab Results  Component Value Date   WBC 8.0 06/24/2022   HGB 13.1 06/24/2022   HCT 39.6 06/24/2022   MCV 95.5 06/24/2022   MCH 31.0 05/22/2017   RDW 12.9 06/24/2022   PLT 291.0 06/24/2022   Last metabolic panel Lab Results  Component Value Date   GLUCOSE 79 06/24/2022   NA 140 06/24/2022   K 4.9 06/24/2022   CL 102 06/24/2022   CO2 29 06/24/2022   BUN 14 06/24/2022   CREATININE 0.84 06/24/2022   GFR 72.90 06/24/2022   CALCIUM 9.5 06/24/2022   PROT 7.5 06/24/2022   ALBUMIN 4.2 06/24/2022   BILITOT 0.4 06/24/2022   ALKPHOS 92 06/24/2022   AST 34 06/24/2022   ALT 28 06/24/2022   ANIONGAP 9 05/22/2017   Last lipids Lab Results  Component Value Date   CHOL 150 06/24/2022   HDL 50.30 06/24/2022   LDLCALC 79 06/24/2022   LDLDIRECT 174.0 08/08/2015   TRIG 105.0 06/24/2022   CHOLHDL 3 06/24/2022   Last hemoglobin A1c Lab Results  Component Value Date   HGBA1C 6.8 (A) 12/06/2022   HGBA1C 6.8 12/06/2022   HGBA1C 6.8 (A) 12/06/2022   HGBA1C 6.8 12/06/2022   Last thyroid functions Lab Results  Component Value Date   TSH 4.71 06/24/2022   T3TOTAL 88.2 10/11/2016   Last vitamin D Lab Results  Component Value Date   VD25OH 52.18 06/24/2022   Last vitamin B12 and Folate Lab Results  Component Value Date   VITAMINB12 720 06/24/2022   IMPRESSION AND PLAN:  #1 diabetes without complication.  Good control. POC Hba1c today is 6.8%. Continue Tresiba 46 units daily and NovoLog 10 to 12 units before every meal as well as metformin 1000 twice daily. Her serum creatinine and electrolytes have consistently been normal over the years, most recently about 5 months ago. Will wait and recheck a metabolic panel in 6  months.  #2 GAD with severe agoraphobia. She is in the process of getting approved for virtual visits with a counselor through her insurer. Continue duloxetine 30 mg daily and clonazepam 2 mg 3 times daily. Controlled substance contract and UDS up-to-date.  #3 Mood d/o (hx ?bipolar) and adult ADD. Not on mood stabilizer at this point in time.  I think her anxiety is the overall driver of her mood at this point. Encouraged her to follow through with counseling. No change in medications today: Adderall 20 mg twice a day and Remeron 30 mg nightly.  An After Visit Summary was printed and given to the patient.  FOLLOW UP: Return in about 6 months (around 06/06/2023) for annual CPE (fasting).  Signed:  Santiago Bumpers, MD           12/06/2022

## 2022-12-10 ENCOUNTER — Other Ambulatory Visit: Payer: Self-pay | Admitting: Family Medicine

## 2022-12-11 MED ORDER — AMPHETAMINE-DEXTROAMPHETAMINE 20 MG PO TABS: 20.0000 mg | ORAL_TABLET | Freq: Two times a day (BID) | ORAL | 0 refills | Status: DC

## 2022-12-11 MED ORDER — NOVOLOG FLEXPEN 100 UNIT/ML ~~LOC~~ SOPN: PEN_INJECTOR | SUBCUTANEOUS | 0 refills | Status: DC

## 2022-12-11 NOTE — Telephone Encounter (Signed)
RF request for insulin aspart (NOVOLOG FLEXPEN) 100 UNIT/ML FlexPen  LOV: 12/06/22 Next ov: advised to f/u 6 mo Last written: 06/24/22 (43mL,0)  Requesting: amphetamine-dextroamphetamine (ADDERALL) 20 MG tablet  Contract: 06/24/22 UDS: 06/24/22 Last Visit: 12/06/22 Next Visit: advised to f/u 6 mo.  Last Refill: 11/12/22 (60,0)  Please Advise. Meds pending

## 2022-12-16 ENCOUNTER — Telehealth: Payer: Self-pay

## 2022-12-16 NOTE — Telephone Encounter (Signed)
PA sent via covermymed on 12/16/22   Key: [id number]   Medication:  HumaLOG KwikPen 100UNIT/ML pen-injectors   Dx: E11.9, Z79.4   Waiting for response.   Glenyce Dicke (Key: [id number]) - [id number] HumaLOG KwikPen 100UNIT/ML pen-injectors status: PA Response - Approved  Authorization Expiration Date: 12/16/2023

## 2022-12-29 ENCOUNTER — Other Ambulatory Visit: Payer: Self-pay | Admitting: "Endocrinology

## 2023-01-03 ENCOUNTER — Telehealth: Payer: Self-pay

## 2023-01-03 NOTE — Patient Outreach (Signed)
Attempted to contact patient regarding care gaps. Left voicemail for patient to return my call at [phone].  Nicholes Rough, CMA Care Guide [employer]

## 2023-01-10 ENCOUNTER — Other Ambulatory Visit: Payer: Self-pay | Admitting: Family Medicine

## 2023-01-29 DIAGNOSIS — Z9889 Other specified postprocedural states: Secondary | ICD-10-CM | POA: Diagnosis not present

## 2023-01-29 DIAGNOSIS — G894 Chronic pain syndrome: Secondary | ICD-10-CM | POA: Diagnosis not present

## 2023-01-29 DIAGNOSIS — M5412 Radiculopathy, cervical region: Secondary | ICD-10-CM | POA: Diagnosis not present

## 2023-01-30 ENCOUNTER — Other Ambulatory Visit: Payer: Self-pay | Admitting: Family Medicine

## 2023-03-06 ENCOUNTER — Other Ambulatory Visit: Payer: Self-pay | Admitting: Family Medicine

## 2023-03-07 NOTE — Telephone Encounter (Signed)
Chart supports rx for atorvastatin. Please advise regarding pended adderall rx.  Last OV: 12/06/2022 Next OV: 07/02/2023

## 2023-03-17 ENCOUNTER — Other Ambulatory Visit: Payer: Self-pay | Admitting: Family Medicine

## 2023-04-09 ENCOUNTER — Other Ambulatory Visit: Payer: Self-pay | Admitting: Family Medicine

## 2023-04-16 DIAGNOSIS — M5412 Radiculopathy, cervical region: Secondary | ICD-10-CM | POA: Diagnosis not present

## 2023-04-16 DIAGNOSIS — S129XXA Fracture of neck, unspecified, initial encounter: Secondary | ICD-10-CM | POA: Diagnosis not present

## 2023-04-16 DIAGNOSIS — M19049 Primary osteoarthritis, unspecified hand: Secondary | ICD-10-CM | POA: Diagnosis not present

## 2023-04-16 DIAGNOSIS — M62838 Other muscle spasm: Secondary | ICD-10-CM | POA: Diagnosis not present

## 2023-04-16 DIAGNOSIS — G894 Chronic pain syndrome: Secondary | ICD-10-CM | POA: Diagnosis not present

## 2023-04-16 DIAGNOSIS — Z9889 Other specified postprocedural states: Secondary | ICD-10-CM | POA: Diagnosis not present

## 2023-04-17 ENCOUNTER — Other Ambulatory Visit (HOSPITAL_COMMUNITY): Payer: Self-pay | Admitting: Pain Medicine

## 2023-04-17 DIAGNOSIS — Z9889 Other specified postprocedural states: Secondary | ICD-10-CM

## 2023-04-23 ENCOUNTER — Ambulatory Visit (HOSPITAL_COMMUNITY)
Admission: RE | Admit: 2023-04-23 | Discharge: 2023-04-23 | Disposition: A | Discharge status: Home / Self Care | Source: Ambulatory Visit | Attending: Pain Medicine | Admitting: Pain Medicine

## 2023-04-23 DIAGNOSIS — M4802 Spinal stenosis, cervical region: Secondary | ICD-10-CM | POA: Diagnosis not present

## 2023-04-23 DIAGNOSIS — Z9889 Other specified postprocedural states: Secondary | ICD-10-CM | POA: Insufficient documentation

## 2023-04-23 DIAGNOSIS — M4803 Spinal stenosis, cervicothoracic region: Secondary | ICD-10-CM | POA: Diagnosis not present

## 2023-04-23 DIAGNOSIS — M47812 Spondylosis without myelopathy or radiculopathy, cervical region: Secondary | ICD-10-CM | POA: Diagnosis not present

## 2023-04-23 MED ORDER — GADOBUTROL 1 MMOL/ML IV SOLN
7.0000 mL | Freq: Once | INTRAVENOUS | Status: AC | PRN
  Administered 2023-04-23: 7 mL via INTRAVENOUS

## 2023-05-01 ENCOUNTER — Other Ambulatory Visit (HOSPITAL_COMMUNITY): Payer: Self-pay | Admitting: Family Medicine

## 2023-05-01 DIAGNOSIS — Z1231 Encounter for screening mammogram for malignant neoplasm of breast: Secondary | ICD-10-CM

## 2023-05-05 ENCOUNTER — Inpatient Hospital Stay (HOSPITAL_COMMUNITY): Admission: RE | Admit: 2023-05-05 | Source: Ambulatory Visit

## 2023-05-08 ENCOUNTER — Other Ambulatory Visit: Payer: Self-pay | Admitting: Family Medicine

## 2023-05-08 DIAGNOSIS — M85859 Other specified disorders of bone density and structure, unspecified thigh: Secondary | ICD-10-CM

## 2023-05-08 DIAGNOSIS — E2839 Other primary ovarian failure: Secondary | ICD-10-CM

## 2023-05-08 NOTE — Telephone Encounter (Signed)
 Requesting: adderall Contract: 06/24/22 UDS: 06/24/22 Last Visit: 12/06/22 Next Visit: 07/02/23 Last Refill: 03/07/23 (60,0)  Please Advise. Med pending

## 2023-05-13 ENCOUNTER — Other Ambulatory Visit: Payer: Self-pay | Admitting: Family Medicine

## 2023-05-19 ENCOUNTER — Other Ambulatory Visit: Payer: Self-pay | Admitting: Family Medicine

## 2023-05-20 ENCOUNTER — Other Ambulatory Visit: Payer: Self-pay | Admitting: Family Medicine

## 2023-05-26 ENCOUNTER — Other Ambulatory Visit: Payer: Self-pay | Admitting: "Endocrinology

## 2023-06-09 ENCOUNTER — Other Ambulatory Visit: Payer: Self-pay | Admitting: Family Medicine

## 2023-06-10 NOTE — Telephone Encounter (Signed)
 Requesting: adderall Contract: 06/24/22 UDS: 06/24/22 Last Visit: 12/06/22 Next Visit: 07/02/23 Last Refill: 05/08/23 (60,0)  Please Advise. Med pending

## 2023-06-28 ENCOUNTER — Other Ambulatory Visit: Payer: Self-pay | Admitting: Family Medicine

## 2023-06-30 NOTE — Telephone Encounter (Signed)
 Pt has upcoming appt 5/21

## 2023-07-02 ENCOUNTER — Encounter: Payer: Self-pay | Admitting: Family Medicine

## 2023-07-02 ENCOUNTER — Ambulatory Visit: Payer: No Typology Code available for payment source | Admitting: Family Medicine

## 2023-07-02 VITALS — BP 134/70 | HR 112 | Temp 99.1°F | Ht 65.75 in | Wt 188.0 lb

## 2023-07-02 DIAGNOSIS — Z79899 Other long term (current) drug therapy: Secondary | ICD-10-CM | POA: Diagnosis not present

## 2023-07-02 DIAGNOSIS — F988 Other specified behavioral and emotional disorders with onset usually occurring in childhood and adolescence: Secondary | ICD-10-CM | POA: Diagnosis not present

## 2023-07-02 DIAGNOSIS — E7800 Pure hypercholesterolemia, unspecified: Secondary | ICD-10-CM

## 2023-07-02 DIAGNOSIS — F39 Unspecified mood [affective] disorder: Secondary | ICD-10-CM

## 2023-07-02 DIAGNOSIS — K909 Intestinal malabsorption, unspecified: Secondary | ICD-10-CM | POA: Diagnosis not present

## 2023-07-02 DIAGNOSIS — E119 Type 2 diabetes mellitus without complications: Secondary | ICD-10-CM

## 2023-07-02 DIAGNOSIS — M25561 Pain in right knee: Secondary | ICD-10-CM

## 2023-07-02 DIAGNOSIS — M19049 Primary osteoarthritis, unspecified hand: Secondary | ICD-10-CM | POA: Diagnosis not present

## 2023-07-02 DIAGNOSIS — Z7984 Long term (current) use of oral hypoglycemic drugs: Secondary | ICD-10-CM | POA: Diagnosis not present

## 2023-07-02 DIAGNOSIS — F4000 Agoraphobia, unspecified: Secondary | ICD-10-CM

## 2023-07-02 DIAGNOSIS — M79642 Pain in left hand: Secondary | ICD-10-CM

## 2023-07-02 DIAGNOSIS — M79641 Pain in right hand: Secondary | ICD-10-CM

## 2023-07-02 DIAGNOSIS — Z794 Long term (current) use of insulin: Secondary | ICD-10-CM | POA: Diagnosis not present

## 2023-07-02 DIAGNOSIS — F411 Generalized anxiety disorder: Secondary | ICD-10-CM | POA: Diagnosis not present

## 2023-07-02 DIAGNOSIS — Z1211 Encounter for screening for malignant neoplasm of colon: Secondary | ICD-10-CM

## 2023-07-02 DIAGNOSIS — Z0001 Encounter for general adult medical examination with abnormal findings: Secondary | ICD-10-CM

## 2023-07-02 DIAGNOSIS — Z1231 Encounter for screening mammogram for malignant neoplasm of breast: Secondary | ICD-10-CM

## 2023-07-02 DIAGNOSIS — G8929 Other chronic pain: Secondary | ICD-10-CM | POA: Diagnosis not present

## 2023-07-02 DIAGNOSIS — M25562 Pain in left knee: Secondary | ICD-10-CM

## 2023-07-02 DIAGNOSIS — Z0000 Encounter for general adult medical examination without abnormal findings: Secondary | ICD-10-CM

## 2023-07-02 LAB — CBC WITH DIFFERENTIAL/PLATELET
Basophils Absolute: 0 10*3/uL (ref 0.0–0.1)
Basophils Relative: 0.5 % (ref 0.0–3.0)
Eosinophils Absolute: 0.3 10*3/uL (ref 0.0–0.7)
Eosinophils Relative: 3.5 % (ref 0.0–5.0)
HCT: 38.7 % (ref 36.0–46.0)
Hemoglobin: 12.9 g/dL (ref 12.0–15.0)
Lymphocytes Relative: 42 % (ref 12.0–46.0)
Lymphs Abs: 3 10*3/uL (ref 0.7–4.0)
MCHC: 33.3 g/dL (ref 30.0–36.0)
MCV: 92.8 fl (ref 78.0–100.0)
Monocytes Absolute: 0.4 10*3/uL (ref 0.1–1.0)
Monocytes Relative: 5.6 % (ref 3.0–12.0)
Neutro Abs: 3.5 10*3/uL (ref 1.4–7.7)
Neutrophils Relative %: 48.4 % (ref 43.0–77.0)
Platelets: 266 10*3/uL (ref 150.0–400.0)
RBC: 4.17 Mil/uL (ref 3.87–5.11)
RDW: 13.6 % (ref 11.5–15.5)
WBC: 7.2 10*3/uL (ref 4.0–10.5)

## 2023-07-02 LAB — POCT GLYCOSYLATED HEMOGLOBIN (HGB A1C)
HbA1c POC (<> result, manual entry): 8 % (ref 4.0–5.6)
HbA1c, POC (controlled diabetic range): 8 % — AB (ref 0.0–7.0)
HbA1c, POC (prediabetic range): 8 % — AB (ref 5.7–6.4)
Hemoglobin A1C: 8 % — AB (ref 4.0–5.6)

## 2023-07-02 LAB — COMPREHENSIVE METABOLIC PANEL WITH GFR
ALT: 28 U/L (ref 0–35)
AST: 35 U/L (ref 0–37)
Albumin: 4.5 g/dL (ref 3.5–5.2)
Alkaline Phosphatase: 103 U/L (ref 39–117)
BUN: 16 mg/dL (ref 6–23)
CO2: 31 meq/L (ref 19–32)
Calcium: 9.5 mg/dL (ref 8.4–10.5)
Chloride: 100 meq/L (ref 96–112)
Creatinine, Ser: 0.95 mg/dL (ref 0.40–1.20)
GFR: 62.45 mL/min (ref >60.00)
Glucose, Bld: 203 mg/dL — ABNORMAL HIGH (ref 70–99)
Potassium: 5.4 meq/L — ABNORMAL HIGH (ref 3.5–5.1)
Sodium: 139 meq/L (ref 135–145)
Total Bilirubin: 0.4 mg/dL (ref 0.2–1.2)
Total Protein: 7.6 g/dL (ref 6.0–8.3)

## 2023-07-02 LAB — LIPID PANEL
Cholesterol: 151 mg/dL (ref 0–200)
HDL: 49.3 mg/dL (ref >39.00)
LDL Cholesterol: 82 mg/dL (ref 0–99)
NonHDL: 101.8
Total CHOL/HDL Ratio: 3
Triglycerides: 100 mg/dL (ref 0.0–149.0)
VLDL: 20 mg/dL (ref 0.0–40.0)

## 2023-07-02 LAB — VITAMIN B12: Vitamin B-12: 907 pg/mL (ref 211–911)

## 2023-07-02 LAB — VITAMIN D 25 HYDROXY (VIT D DEFICIENCY, FRACTURES): VITD: 51.61 ng/mL (ref 30.00–100.00)

## 2023-07-02 MED ORDER — GABAPENTIN 300 MG PO CAPS: ORAL_CAPSULE | ORAL | 1 refills | Status: DC

## 2023-07-02 MED ORDER — METFORMIN HCL ER 500 MG PO TB24: ORAL_TABLET | ORAL | 1 refills | Status: DC

## 2023-07-02 MED ORDER — ATORVASTATIN CALCIUM 20 MG PO TABS: ORAL_TABLET | ORAL | 1 refills | Status: DC

## 2023-07-02 MED ORDER — LAMOTRIGINE 100 MG PO TABS: 100.0000 mg | ORAL_TABLET | Freq: Every day | ORAL | 1 refills | Status: DC

## 2023-07-02 MED ORDER — FREESTYLE PRECISION NEO TEST VI STRP: ORAL_STRIP | 2 refills | Status: AC

## 2023-07-02 MED ORDER — NOVOLOG FLEXPEN 100 UNIT/ML ~~LOC~~ SOPN: PEN_INJECTOR | SUBCUTANEOUS | 3 refills | Status: DC

## 2023-07-02 MED ORDER — CLONAZEPAM 2 MG PO TABS: ORAL_TABLET | ORAL | 5 refills | Status: DC

## 2023-07-02 MED ORDER — MIRTAZAPINE 30 MG PO TABS: ORAL_TABLET | ORAL | 1 refills | Status: DC

## 2023-07-02 MED ORDER — CYANOCOBALAMIN 1000 MCG/ML IJ SOLN: INTRAMUSCULAR | 4 refills | Status: DC

## 2023-07-02 MED ORDER — DULOXETINE HCL 30 MG PO CPEP: ORAL_CAPSULE | ORAL | 1 refills | Status: DC

## 2023-07-02 MED ORDER — TRESIBA FLEXTOUCH 100 UNIT/ML ~~LOC~~ SOPN: PEN_INJECTOR | SUBCUTANEOUS | 3 refills | Status: DC

## 2023-07-02 MED ORDER — AMPHETAMINE-DEXTROAMPHETAMINE 20 MG PO TABS: 20.0000 mg | ORAL_TABLET | Freq: Two times a day (BID) | ORAL | 0 refills | Status: DC

## 2023-07-02 NOTE — Patient Instructions (Signed)
   Your A1c was 8.0  It was very nice to see you today!   PLEASE NOTE:  If labs were collected or images ordered, we will inform you of  results once we have received them and reviewed. We will contact you either by echart message, or telephone call.    If we ordered any referrals today, please let us  know if you have not heard from their office within the next 2 weeks. You should receive a letter via MyChart confirming if the referral was approved and their office contact information to schedule.   Please try these tips to maintain a healthy lifestyle:  Eat most of your calories during the day when you are active. Eliminate processed foods including packaged sweets (pies, cakes, cookies), reduce intake of potatoes, white bread, white pasta, and white rice. Look for whole grain options, oat flour or almond flour.  Each meal should contain half fruits/vegetables, one quarter protein, and one quarter carbs (no bigger than a computer mouse).  Cut down on sweet beverages. This includes juice, soda, and sweet tea. Also watch fruit intake, though this is a healthier sweet option, it still contains natural sugar! Limit to 3 servings daily.  Drink at least 1 glass of water with each meal and aim for at least 8 glasses per day  Exercise at least 150 minutes every week.

## 2023-07-02 NOTE — Progress Notes (Unsigned)
 Office Note 07/07/2023  CC:  Chief Complaint  Patient presents with   Annual Exam   Patient is a 68 y.o. female who is here accompanied by her husband Ethelle Herb for annual health maintenance exam and 6 months follow-up diabetes, mood disorder, anxiety disorder, multiple other chronic medical problems. A/P as of last visit: "1 diabetes without complication.  Good control. POC Hba1c today is 6.8%. Continue Tresiba  46 units daily and NovoLog  10 to 12 units before every meal as well as metformin  1000 twice daily. Her serum creatinine and electrolytes have consistently been normal over the years, most recently about 5 months ago. Will wait and recheck a metabolic panel in 6 months.   #2 GAD with severe agoraphobia. She is in the process of getting approved for virtual visits with a counselor through her insurer. Continue duloxetine  30 mg daily and clonazepam  2 mg 3 times daily. Controlled substance contract and UDS up-to-date.   #3 Mood d/o (hx ?bipolar) and adult ADD. Not on mood stabilizer at this point in time.  I think her anxiety is the overall driver of her mood at this point. Encouraged her to follow through with counseling. No change in medications today: Adderall 20 mg twice a day and Remeron  30 mg nightly."  INTERIM HX: Glucoses trend up after meals especially in the evening then she has some hypoglycemia fairly common in the early morning hours.  Currently on 50 units of Tresiba  daily, takes anywhere from 0 to 18 units of NovoLog  with meal. She has waxing waning chronic knee pain but tripped over a speed bump about 6 months ago and since then has had swelling and pain mostly in the left knee.  Also says MCPs and thumbs hurt all the time, notes swelling on the MCPs of the right hand.  Chronic anxiety and agoraphobia unchanged.  Mood overall stable.   Past Medical History:  Diagnosis Date   Abnormal liver diagnostic imaging 05/20/2017   CT abd->cirrhosis with atypical  lesions that MRI f/u determined to be focal regions of severe steatosis.   Abnormal mammogram 12/2014   F/u diagnostic mammo and u/s NORMAL--resume annual screening mammography   Arthritis    Bipolar disorder (HCC)    questionable   Cervical radiculopathy    Post cerv fusion C4-5, C5-6--stable as of 06/2019 NS f/u.   Chronic fatigue    Chronic nausea 2012/13   Normal upper GI  12/2011 (mild GERD noted +small hiatal hernia)+   Chronic pain syndrome    Chronic C spine pain with radiculopathy.  Dr. Vonn Guard at [facility] was her pain mgmt MD and he discharged her from [facility] pain mgmt center b/c she failed a UDS (+marijuana) 04/27/15. Dr. Gwendlyn Lemmings referred her to pain mgmt 2021.   Chronic renal insufficiency, stage II (mild) 02/2015   GFR 70s   Colon cancer screening 03/06/2011   iFob 11/2010 through her insurer was negative. iFOB 01/2015 NEG.   Depression    Diabetes mellitus    urine protein-Cr ratio nl 09/2010, D.R. Screen neg 10/2009   Fibromyalgia    Fracture of rib of left side 08/14/2015   5th and 6th   Hepatic steatosis 2005   ultrasound.  Also, 07/2017 MRI abd--> plus a few focal areas of more severe steatosis.  Stable fatty liver on ultrasound 05/2021.   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  Spleen normal 2019.  Stable fatty liver on ultrasound 05/2021   Nephrolithiasis 11/2012   Neurosis, anxiety, panic type    Obesity    s/p bariatric surgery   Osteopenia 12/2010   2012 Hip T score -2.  09/2020 T score -2.2   Other B-complex deficiencies    Panic attacks    Restless legs syndrome    Syncopal episodes    7/73/17 in the setting of dehydration  and sedative medication   Tobacco dependence    Vitamin B12 deficiency    Vitamin D  deficiency 09/2009   Wears dentures    uppers and lowers    Past Surgical History:  Procedure Laterality Date   ABDOMINAL HYSTERECTOMY     fibroids, ovaries remain   APPENDECTOMY     BACK SURGERY     BLADDER SURGERY     bladder tack   CERVICAL SPINE SURGERY  09/2009; 1027;2536   2014-ant cerv decomp (Dr. Joanette Moynahan). 2018 fusion (Dr. Adonis Alamin)   CHOLECYSTECTOMY  04/2010   w/lysis of adhesions (open procedure)--path showed chronic cholecystitis and cholesterol polyp.   CT CERVICAL SPINE W CONTRAST  ([facility] HX)  06/2019   No evidence of hardware complication or loosening of her instrumentation.   CYSTOSCOPY/RETROGRADE/URETEROSCOPY/STONE EXTRACTION WITH BASKET Left 11/21/2012   Left UVJ stone.  Procedure: CYSTOSCOPY/RETROGRADE/URETEROSCOPY/STONE EXTRACTION WITH BASKET insertion double j stent;  Surgeon: Edmund Gouge, MD;  Location: [facility];  Service: Urology;  Laterality: Left;   DEXA  09/2020   T score -2.2. Rpt 2 yrs.   EPIDURAL BLOCK INJECTION  03/2011   Cervical   HERNIA REPAIR  2005   mesenteric hernia repair, with lysis of adhesions (presented with SBO)   POSTERIOR CERVICAL FUSION/FORAMINOTOMY N/A 12/31/2016   Procedure: Posterior Cervical Fusion with lateral mass fixation - Cervical four-five, Cervical five-six;  Surgeon: Agustina Aldrich, MD;  Location: [facility] OR;  Service: Neurosurgery;  Laterality: N/A;   ROUX-EN-Y GASTRIC BYPASS     TONSILLECTOMY      Family History  Problem Relation Age of Onset   Heart disease Mother    Cancer Mother    Hypertension Mother    Hyperlipidemia Mother    Stroke Mother    Heart disease Father    Hypertension Father    Hyperlipidemia Father    Breast cancer Sister    Cancer Sister    Heart disease Sister    Alzheimer's disease Brother    Kidney disease Brother     Social History   Socioeconomic History   Marital status: Significant Other    Spouse name:  Not on file   Number of children: Not on file   Years of education: Not on file   Highest education level: Not on file  Occupational History   Not on file  Tobacco Use   Smoking status: Former    Current packs/day: 0.00    Average packs/day: 1 pack/day for 1 year (1.0 ttl pk-yrs)    Types: Cigarettes    Start date: 04/12/2015    Quit date: 04/11/2016    Years since quitting: 7.2   Smokeless tobacco: Never  Vaping Use   Vaping status: Never Used  Substance and Sexual Activity   Alcohol use: No   Drug use: No   Sexual activity: Not on file  Other Topics Concern   Not on file  Social History Narrative   Lives with longtime boyfriend in [place], [place].   Had a stepson but he committed suicide 2012/13.   Disabled due to anxiety.   Longtime smoker, quits frequently but restarts.   No alcohol or illegal drugs.   No exercise.   Social Drivers of Corporate investment banker Strain: Low Risk  (07/03/2022)   Overall Financial Resource Strain (CARDIA)    Difficulty of Paying Living Expenses: Not hard at all  Food Insecurity: No Food Insecurity (07/03/2022)   Hunger Vital Sign    Worried About Running Out of Food in the Last Year: Never true    Ran Out of Food in the Last Year: Never true  Transportation Needs: No Transportation Needs (07/03/2022)   PRAPARE - Administrator, Civil Service (Medical): No    Lack of Transportation (Non-Medical): No  Physical Activity: Insufficiently Active (07/03/2022)   Exercise Vital Sign    Days of Exercise per Week: 7 days    Minutes of Exercise per Session: 20 min  Stress: Stress Concern Present (07/03/2022)   [employer] of Occupational Health - Occupational Stress Questionnaire    Feeling of Stress : Rather much  Social Connections: Socially Isolated (07/03/2022)   Social Connection and Isolation Panel [NHANES]    Frequency of Communication with Friends and Family: Once a week    Frequency of Social Gatherings with Friends and  Family: Never    Attends Religious Services: Never    Database administrator or Organizations: No    Attends Banker Meetings: Never    Marital Status: Living with partner  Intimate Partner Violence: Not At Risk (07/03/2022)   Humiliation, Afraid, Rape, and Kick questionnaire    Fear of Current or Ex-Partner: No    Emotionally Abused: No    Physically Abused: No    Sexually Abused: No    Outpatient Medications Prior to Visit  Medication Sig Dispense Refill   BD PEN NEEDLE NANO U/F 32G X 4 MM MISC USE AS DIRECTED TO INJECT INSULIN  UP TO 5 TIMES DAILY. 200 each 0   calcium  elemental as carbonate (TUMS ULTRA 1000) 400 MG chewable tablet Chew 1,000 mg by mouth daily.     Cholecalciferol  2000 UNITS CAPS Take 1 capsule (2,000 Units total) by mouth daily. (Patient taking differently: Take 1 capsule by mouth daily. VIT D) 30 each 11   Continuous Glucose Receiver (FREESTYLE LIBRE 2 READER) DEVI As directed 1 each 0   Continuous Glucose Sensor (FREESTYLE LIBRE 2 SENSOR) MISC USE TO TEST BLOOD SUGAR AS DIRECTED. APPLY NEW SENSOR EVERY 14 DAYS. 2 each 3   cyclobenzaprine  (FLEXERIL ) 5 MG tablet Take 5 mg by mouth 2 (two) times daily as needed.     FERROUS SULFATE PO Take by mouth.     HYDROcodone -acetaminophen  (NORCO) 10-325 MG tablet Take 1 tablet by mouth 3 (three) times daily as needed.     ondansetron  (ZOFRAN ) 4 MG tablet TAKE 1 TABLET BY MOUTH EVERY 6 HOURS AS NEEDED FOR NAUSEA. 60 tablet 1   insulin  lispro (HUMALOG ) 100 UNIT/ML KwikPen INJECT 10 UNITS UNDER THE SKIN BEFORE MEALS. 15 mL 0   amphetamine -dextroamphetamine  (ADDERALL) 20 MG tablet TAKE ONE TABLET BY MOUTH TWICE DAILY. 60 tablet 0   atorvastatin  (LIPITOR) 20 MG tablet TAKE (1) TABLET BY MOUTH ONCE DAILY. 90 tablet 1   clonazePAM  (KLONOPIN ) 2 MG tablet TAKE (1) TABLET BY MOUTH (3)  TIMES DAILY. 90 tablet 5   cyanocobalamin  (VITAMIN B12) 1000 MCG/ML injection INJECT 1ML INTO THE MUSCLE EVERY 14 DAYS. 4 mL 4   DULoxetine   (CYMBALTA ) 30 MG capsule TAKE (1) CAPSULE BY MOUTH ONCE DAILY. 90 capsule 1   gabapentin  (NEURONTIN ) 300 MG capsule TAKE 4 CAPSULES BY MOUTH AT BEDTIME. 360 capsule 1   glucose blood (FREESTYLE PRECISION NEO TEST) test strip Use as instructed 100 each 2   insulin  aspart (NOVOLOG  FLEXPEN) 100 UNIT/ML FlexPen INJECT 10 UNITS UNDER THE SKIN BEFORE MEALS. 15 mL 0   insulin  degludec (TRESIBA  FLEXTOUCH) 100 UNIT/ML FlexTouch Pen INJECT 70 UNITS SUBCUTANEOUSLY. MUST KEEP APPT FOR FURTHER REFILLS 15 mL 0   lamoTRIgine  (LAMICTAL ) 100 MG tablet TAKE 1 TABLET BY MOUTH ONCE DAILY. 90 tablet 1   metFORMIN  (GLUCOPHAGE -XR) 500 MG 24 hr tablet TAKE (2) TABLET BY MOUTH ONCE DAILY WITH BREAKFAST. 180 tablet 1   mirtazapine  (REMERON ) 30 MG tablet TAKE (1) TABLET BY MOUTH AT BEDTIME. 90 tablet 1   No facility-administered medications prior to visit.    Allergies  Allergen Reactions   Aspirin Other (See Comments)    Gastric Bypass   Latex Other (See Comments)    blisters   Penicillins     UNSPECIFIED REACTION >"Took large quantities as a child & told to never take again Has patient had a PCN reaction causing immediate rash, facial/tongue/throat swelling, SOB or lightheadedness with hypotension: No Has patient had a PCN reaction causing severe rash involving mucus membranes or skin necrosis: No Has patient had a PCN reaction that required hospitalization: No Has patient had a PCN reaction occurring within the last 10 years: No If all of the above answers are "NO", then may proceed wi   Nickel    Codeine Itching    Review of Systems  Constitutional:  Negative for fatigue and fever.  HENT:  Negative for congestion and sore throat.   Eyes:  Negative for visual disturbance.  Respiratory:  Negative for cough.   Cardiovascular:  Negative for chest pain.  Gastrointestinal:  Negative for abdominal pain and nausea.  Genitourinary:  Negative for dysuria.  Musculoskeletal:  Positive for joint swelling (MCPs) and  neck pain (chronic). Negative for back pain.  Skin:  Negative for rash.  Neurological:  Negative for weakness and headaches.  Hematological:  Negative for adenopathy.    PE;    07/02/2023   11:08 AM 07/02/2023   10:39 AM 12/06/2022   10:54 AM  Vitals with BMI  Height  5' 5.75"   Weight  188 lbs 173 lbs 10 oz  BMI  30.58   Systolic 134 144 295  Diastolic 70 77 84  Pulse  112 621    Gen: Alert, well appearing.  Patient is oriented to person, place, time, and situation. AFFECT: pleasant, lucid thought and speech. ENT: Ears: EACs clear, normal epithelium.  TMs with good light reflex and landmarks bilaterally.  Eyes: no injection, icteris, swelling, or exudate.  EOMI, PERRLA. Nose: no drainage or turbinate edema/swelling.  No injection or focal lesion.  Mouth: lips without lesion/swelling.  Oral mucosa pink and moist.  Dentition intact and without obvious caries or gingival swelling.  Oropharynx without erythema, exudate, or swelling.  Neck: supple/nontender.  No LAD, mass, or TM.  Carotid pulses 2+ bilaterally, without bruits. CV: RRR, no m/r/g.   LUNGS: CTA bilat, nonlabored resps, good aeration in all lung fields. ABD: soft, NT, ND, BS normal.  No hepatospenomegaly or mass.  No bruits.  EXT: no clubbing, cyanosis, or edema.  Musculoskeletal: first CMPs and MCPs TTP R>L, mild swelling but no erythema.  ROM intact.  No deformity. Both knees are without any swelling, erythema, tenderness to palpation, or instability. Skin - PALE (her baseline). no sores or suspicious lesions or rashes or color changes  Pertinent labs:  Lab Results  Component Value Date   TSH 4.71 06/24/2022   Lab Results  Component Value Date   WBC 7.2 07/02/2023   HGB 12.9 07/02/2023   HCT 38.7 07/02/2023   MCV 92.8 07/02/2023   PLT 266.0 07/02/2023   Lab Results  Component Value Date   CREATININE 0.95 07/02/2023   BUN 16 07/02/2023   NA 139 07/02/2023   K 5.4 No hemolysis seen (H) 07/02/2023   CL 100  07/02/2023   CO2 31 07/02/2023   Lab Results  Component Value Date   ALT 28 07/02/2023   AST 35 07/02/2023   ALKPHOS 103 07/02/2023   BILITOT 0.4 07/02/2023   Lab Results  Component Value Date   CHOL 151 07/02/2023   Lab Results  Component Value Date   HDL 49.30 07/02/2023   Lab Results  Component Value Date   LDLCALC 82 07/02/2023   Lab Results  Component Value Date   TRIG 100.0 07/02/2023   Lab Results  Component Value Date   CHOLHDL 3 07/02/2023   Lab Results  Component Value Date   HGBA1C 8.0 (A) 07/02/2023   HGBA1C 8.0 07/02/2023   HGBA1C 8.0 (A) 07/02/2023   HGBA1C 8.0 (A) 07/02/2023   Lab Results  Component Value Date   VITAMINB12 907 07/02/2023   Last vitamin D  Lab Results  Component Value Date   VD25OH 51.61 07/02/2023   Lab Results  Component Value Date   IRON 70 07/02/2023   TIBC 331 07/02/2023   FERRITIN 44 07/02/2023   ASSESSMENT AND PLAN:   #1 Health maintenance exam: Reviewed age and gender appropriate health maintenance issues (prudent diet, regular exercise, health risks of tobacco and excessive alcohol, use of seatbelts, fire alarms in home, use of sunscreen).  Also reviewed age and gender appropriate health screening as well as vaccine recommendations. Vaccines:  UTD. Labs: Health panel Cervical ca screening: N/A.  Patient with history of total hysterectomy for benign diagnosis. Breast ca screening: Normal mammogram October 2023. -->ordered today. Colon ca screening: She did not turn in Cologuard from last year--->ordered again today.  2 diabetes without complication. Poor control.  Hba1c today is 8.0%. Due to relatively frequent morning hypoglycemia we will decrease Tresiba  to 46 units daily and she will try to get more aggressive with mealtime NovoLog  10- 20 units before every meal as well as metformin  1000 twice daily. Hba1c and metabolic panel today.   3 GAD with severe agoraphobia. Continue duloxetine  30 mg daily and  clonazepam  2 mg 3 times daily. Controlled substance contract and UDS up-to-date.   #4 Mood d/o (hx ?bipolar) and adult ADD. Cont lamictal  100mg  every day. Cont Adderall 20 mg twice a day and Remeron  30 mg nightly.  #5 HLD, cont atorva 20 every day, check lipids today.  #6 Vit D, B12, iron deficiency d/t malabsorption s/p remote gastric bypass surgery. Check levels today.  #7 arthritis. Severe and CMCs, has had steroid injections by pain management physician before. Will check bilateral hand x-rays and bilateral knee x-rays.  An After Visit Summary was printed and given to the patient.  FOLLOW UP:  Return in about 6 months (around 01/02/2024) for  routine chronic illness f/u.  Signed:  Arletha Lady, MD           07/07/2023

## 2023-07-05 LAB — DRUG MONITORING PANEL 376104, URINE
Alphahydroxyalprazolam: NEGATIVE ng/mL (ref <25)
Alphahydroxymidazolam: NEGATIVE ng/mL (ref <50)
Alphahydroxytriazolam: NEGATIVE ng/mL (ref <50)
Aminoclonazepam: 1113 ng/mL — ABNORMAL HIGH (ref <25)
Amphetamine: 13311 ng/mL — ABNORMAL HIGH (ref <250)
Amphetamines: POSITIVE ng/mL — AB (ref <500)
Barbiturates: NEGATIVE ng/mL (ref <300)
Benzodiazepines: POSITIVE ng/mL — AB (ref <100)
Cocaine Metabolite: NEGATIVE ng/mL (ref <150)
Codeine: NEGATIVE ng/mL (ref <50)
Desmethyltramadol: NEGATIVE ng/mL (ref <100)
Hydrocodone: 1584 ng/mL — ABNORMAL HIGH (ref <50)
Hydromorphone: 106 ng/mL — ABNORMAL HIGH (ref <50)
Hydroxyethylflurazepam: NEGATIVE ng/mL (ref <50)
Lorazepam: NEGATIVE ng/mL (ref <50)
Methamphetamine: NEGATIVE ng/mL (ref <250)
Morphine: NEGATIVE ng/mL (ref <50)
Nordiazepam: NEGATIVE ng/mL (ref <50)
Norhydrocodone: 2437 ng/mL — ABNORMAL HIGH (ref <50)
Opiates: POSITIVE ng/mL — AB (ref <100)
Oxazepam: NEGATIVE ng/mL (ref <50)
Oxycodone: NEGATIVE ng/mL (ref <100)
Temazepam: NEGATIVE ng/mL (ref <50)
Tramadol: NEGATIVE ng/mL (ref <100)

## 2023-07-05 LAB — IRON,TIBC AND FERRITIN PANEL
%SAT: 21 % (ref 16–45)
Ferritin: 44 ng/mL (ref 16–288)
Iron: 70 ug/dL (ref 45–160)
TIBC: 331 ug/dL (ref 250–450)

## 2023-07-05 LAB — DM TEMPLATE

## 2023-07-10 ENCOUNTER — Other Ambulatory Visit: Payer: Self-pay | Admitting: Family Medicine

## 2023-08-19 ENCOUNTER — Telehealth: Payer: Self-pay

## 2023-08-19 DIAGNOSIS — E119 Type 2 diabetes mellitus without complications: Secondary | ICD-10-CM

## 2023-08-19 MED ORDER — FREESTYLE LIBRE 3 PLUS SENSOR MISC: 2 refills | Status: DC

## 2023-08-19 MED ORDER — FREESTYLE LIBRE 3 READER DEVI: 2 refills | Status: DC

## 2023-08-19 NOTE — Addendum Note (Signed)
 Addended by: FLETA CARE D on: 08/19/2023 01:26 PM   Modules accepted: Orders

## 2023-08-19 NOTE — Telephone Encounter (Addendum)
 Copied from CRM [id number]. Topic: Clinical - Medication Question >> Aug 19, 2023 10:02 AM Laymon HERO wrote: Reason for CRM: Patient wanting to know if she can get a Libre 3 due to the [place] 2 being phased out.  LVM for pt to return call, Freestyle Libre 3 plus sent to pharmacy listed on file.

## 2023-08-20 ENCOUNTER — Ambulatory Visit: Admitting: *Deleted

## 2023-08-20 ENCOUNTER — Other Ambulatory Visit: Payer: Self-pay

## 2023-08-20 VITALS — Ht 66.0 in | Wt 183.0 lb

## 2023-08-20 DIAGNOSIS — Z0000 Encounter for general adult medical examination without abnormal findings: Secondary | ICD-10-CM | POA: Diagnosis not present

## 2023-08-20 MED ORDER — AMPHETAMINE-DEXTROAMPHETAMINE 20 MG PO TABS: 20.0000 mg | ORAL_TABLET | Freq: Two times a day (BID) | ORAL | 0 refills | Status: DC

## 2023-08-20 NOTE — Progress Notes (Signed)
 Subjective:   Diana [name] is a 68 y.o. female who presents for Medicare Annual (Subsequent) preventive examination.  Visit Complete: Virtual I connected with  Diana [name] on 08/20/23 by a audio enabled telemedicine application and verified that I am speaking with the correct person using two identifiers.  Patient Location: Home  Provider Location: Home Office  I discussed the limitations of evaluation and management by telemedicine. The patient expressed understanding and agreed to proceed.  Vital Signs: Because this visit was a virtual/telehealth visit, some criteria may be missing or patient reported. Any vitals not documented were not able to be obtained and vitals that have been documented are patient reported.   Cardiac Risk Factors include: advanced age (>42men, >59 women);diabetes mellitus;obesity (BMI >30kg/m2);sedentary lifestyle;family history of premature cardiovascular disease     Objective:    Today's Vitals   08/20/23 1133  Weight: 183 lb (83 kg)  Height: 5' 6 (1.676 m)  PainSc: 6    Body mass index is 29.54 kg/m.     08/20/2023   11:36 AM 07/03/2022    2:59 PM 04/18/2021    3:16 PM 05/20/2017    3:35 AM 05/19/2017    8:39 PM 12/26/2016    8:43 AM 10/11/2016    1:24 PM  Advanced Directives  Does Patient Have a Medical Advance Directive? No No No No  No  No  No   Would patient like information on creating a medical advance directive? No - Patient declined No - Patient declined Yes (MAU/Ambulatory/Procedural Areas - Information given) No - Patient declined  No - Patient declined  Yes (MAU/Ambulatory/Procedural Areas - Information given)  Yes (MAU/Ambulatory/Procedural Areas - Information given)      Data saved with a previous flowsheet row definition    Current Medications (verified) Outpatient Encounter Medications as of 08/20/2023  Medication Sig   amphetamine -dextroamphetamine  (ADDERALL) 20 MG tablet Take 1 tablet (20 mg total) by mouth 2 (two)  times daily.   atorvastatin  (LIPITOR) 20 MG tablet TAKE (1) TABLET BY MOUTH ONCE DAILY.   BD PEN NEEDLE NANO ULTRAFINE 32G X 4 MM MISC USE AS DIRECTED TO INJECT INSULIN  UP TO 5 TIMES DAILY.   calcium  elemental as carbonate (TUMS ULTRA 1000) 400 MG chewable tablet Chew 1,000 mg by mouth daily.   Cholecalciferol  2000 UNITS CAPS Take 1 capsule (2,000 Units total) by mouth daily.   clonazePAM  (KLONOPIN ) 2 MG tablet TAKE (1) TABLET BY MOUTH (3) TIMES DAILY.   Continuous Glucose Receiver (FREESTYLE LIBRE 2 READER) DEVI As directed   Continuous Glucose Receiver (FREESTYLE LIBRE 3 READER) DEVI USE TO CHECK GLUCOSE.   Continuous Glucose Sensor (FREESTYLE LIBRE 2 SENSOR) MISC USE TO TEST BLOOD SUGAR AS DIRECTED. APPLY NEW SENSOR EVERY 14 DAYS.   Continuous Glucose Sensor (FREESTYLE LIBRE 3 PLUS SENSOR) MISC Change sensor every 15 days.   cyanocobalamin  (VITAMIN B12) 1000 MCG/ML injection INJECT INTO THE MUSCLE EVERY 14 DAYS.   cyclobenzaprine  (FLEXERIL ) 5 MG tablet Take 5 mg by mouth 2 (two) times daily as needed.   DULoxetine  (CYMBALTA ) 30 MG capsule TAKE (1) CAPSULE BY MOUTH ONCE DAILY.   FERROUS SULFATE PO Take by mouth.   gabapentin  (NEURONTIN ) 300 MG capsule TAKE 4 CAPSULES BY MOUTH AT BEDTIME.   glucose blood (FREESTYLE PRECISION NEO TEST) test strip Use as instructed   HYDROcodone -acetaminophen  (NORCO) 10-325 MG tablet Take 1 tablet by mouth 3 (three) times daily as needed.   insulin  aspart (NOVOLOG  FLEXPEN) 100 UNIT/ML FlexPen INJECT  10 UNITS UNDER THE SKIN BEFORE MEALS.   insulin  degludec (TRESIBA  FLEXTOUCH) 100 UNIT/ML FlexTouch Pen INJECT 70 UNITS SUBCUTANEOUSLY.   lamoTRIgine  (LAMICTAL ) 100 MG tablet Take 1 tablet (100 mg total) by mouth daily.   metFORMIN  (GLUCOPHAGE -XR) 500 MG 24 hr tablet TAKE (2) TABLET BY MOUTH ONCE DAILY WITH BREAKFAST.   mirtazapine  (REMERON ) 30 MG tablet TAKE (1) TABLET BY MOUTH AT BEDTIME.   ondansetron  (ZOFRAN ) 4 MG tablet TAKE 1 TABLET BY MOUTH EVERY 6 HOURS AS  NEEDED FOR NAUSEA.   No facility-administered encounter medications on file as of 08/20/2023.    Allergies (verified) Aspirin, Latex, Penicillins, Nickel, and Codeine   History: Past Medical History:  Diagnosis Date   Abnormal liver diagnostic imaging 05/20/2017   CT abd->cirrhosis with atypical lesions that MRI f/u determined to be focal regions of severe steatosis.   Abnormal mammogram 12/2014   F/u diagnostic mammo and u/s NORMAL--resume annual screening mammography   Arthritis    Bipolar disorder (HCC)    questionable   Cervical radiculopathy    Post cerv fusion C4-5, C5-6--stable as of 06/2019 NS f/u.   Chronic fatigue    Chronic nausea 2012/13   Normal upper GI  12/2011 (mild GERD noted +small hiatal hernia)+   Chronic pain syndrome    Chronic C spine pain with radiculopathy.  Dr. Rosella at [facility] was her pain mgmt MD and he discharged her from [facility] pain mgmt center b/c she failed a UDS (+marijuana) 04/27/15. Dr. Malcolm referred her to pain mgmt 2021.   Chronic renal insufficiency, stage II (mild) 02/2015   GFR 70s   Colon cancer screening 03/06/2011   iFob 11/2010 through her insurer was negative. iFOB 01/2015 NEG.   Depression    Diabetes mellitus    urine protein-Cr ratio nl 09/2010, D.R. Screen neg 10/2009   Fibromyalgia    Fracture of rib of left side 08/14/2015   5th and 6th   Hepatic steatosis 2005   ultrasound.  Also, 07/2017 MRI abd--> plus a few focal areas of more severe steatosis.  Stable fatty liver on ultrasound 05/2021.   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  Spleen normal 2019.  Stable fatty liver on ultrasound  05/2021   Nephrolithiasis 11/2012   Neurosis, anxiety, panic type    Obesity    s/p bariatric surgery   Osteopenia 12/2010   2012 Hip T score -2.  09/2020 T score -2.2   Other B-complex deficiencies    Panic attacks    Restless legs syndrome    Syncopal episodes    7/73/17 in the setting of dehydration and sedative medication   Tobacco dependence    Vitamin B12 deficiency    Vitamin D  deficiency 09/2009   Wears dentures    uppers and lowers   Past Surgical History:  Procedure Laterality Date   ABDOMINAL HYSTERECTOMY     fibroids, ovaries remain   APPENDECTOMY     BACK SURGERY     BLADDER SURGERY     bladder tack   CERVICAL SPINE SURGERY  09/2009; 7985;7981   2014-ant cerv decomp (Dr. Gaither). 2018 fusion (Dr. Louis)   CHOLECYSTECTOMY  04/2010   w/lysis of adhesions (open procedure)--path showed chronic cholecystitis and cholesterol polyp.   CT CERVICAL SPINE W CONTRAST  ([facility] HX)  06/2019   No evidence of hardware complication or loosening of her instrumentation.   CYSTOSCOPY/RETROGRADE/URETEROSCOPY/STONE EXTRACTION WITH BASKET Left 11/21/2012   Left UVJ stone.  Procedure: CYSTOSCOPY/RETROGRADE/URETEROSCOPY/STONE EXTRACTION WITH BASKET insertion double j stent;  Surgeon: Arlena LILLETTE Gal, MD;  Location: [facility];  Service: Urology;  Laterality: Left;   DEXA  09/2020   T score -2.2. Rpt 2 yrs.   EPIDURAL BLOCK INJECTION  03/2011   Cervical   HERNIA REPAIR  2005   mesenteric hernia repair, with lysis of adhesions (presented with SBO)   POSTERIOR CERVICAL FUSION/FORAMINOTOMY N/A 12/31/2016   Procedure: Posterior Cervical Fusion with lateral mass fixation - Cervical four-five, Cervical five-six;  Surgeon: Louis Shove, MD;  Location: [facility] OR;  Service: Neurosurgery;  Laterality: N/A;   ROUX-EN-Y GASTRIC BYPASS     TONSILLECTOMY     Family History  Problem Relation Age of Onset   Heart disease Mother    Cancer Mother    Hypertension Mother    Hyperlipidemia Mother    Stroke  Mother    Heart disease Father    Hypertension Father    Hyperlipidemia Father    Breast cancer Sister    Cancer Sister    Heart disease Sister    Alzheimer's disease Brother    Kidney disease Brother    Social History   Socioeconomic History   Marital status: Significant Other    Spouse name: Not on file   Number of children: Not on file   Years of education: Not on file   Highest education level: Not on file  Occupational History   Not on file  Tobacco Use   Smoking status: Former    Current packs/day: 0.00    Average packs/day: 1 pack/day for 1 year (1.0 ttl pk-yrs)    Types: Cigarettes    Start date: 04/12/2015    Quit date: 04/11/2016    Years since quitting: 7.3   Smokeless tobacco: Never  Vaping Use   Vaping status: Never Used  Substance and Sexual Activity   Alcohol use: No   Drug use: No   Sexual activity: Not Currently  Other Topics Concern   Not on file  Social History Narrative   Lives with longtime boyfriend in [place], [place].   Had a stepson but he committed suicide 2012/13.   Disabled due to anxiety.   Longtime smoker, quits frequently but restarts.   No alcohol or illegal drugs.   No exercise.   Social Drivers of Corporate investment banker Strain: Low Risk  (08/20/2023)   Overall Financial Resource Strain (CARDIA)    Difficulty of Paying Living Expenses: Not hard at all  Food Insecurity: No Food Insecurity (08/20/2023)   Hunger Vital Sign    Worried About Running Out of Food in the Last Year: Never true    Ran Out of Food in the Last Year: Never true  Transportation Needs: No Transportation Needs (08/20/2023)   PRAPARE - Administrator, Civil Service (Medical): No    Lack of Transportation (Non-Medical): No  Physical Activity: Inactive (08/20/2023)   Exercise Vital Sign    Days of Exercise per Week: 0 days    Minutes of Exercise per Session: 0 min  Stress: Stress Concern Present (08/20/2023)   [employer] of Occupational Health -  Occupational Stress Questionnaire    Feeling  of Stress: Rather much  Social Connections: Unknown (08/20/2023)   Social Connection and Isolation Panel    Frequency of Communication with Friends and Family: Never    Frequency of Social Gatherings with Friends and Family: Never    Attends Religious Services: Never    Diplomatic Services operational officer: No    Attends Engineer, structural: Never    Marital Status: Not on file    Tobacco Counseling Counseling given: Not Answered   Clinical Intake:  Pre-visit preparation completed: Yes  Pain : 0-10 Pain Score: 6  Pain Type: Chronic pain Pain Radiating Towards: all over Pain Descriptors / Indicators: Burning, Constant, Aching, Dull, Shooting, Sharp Pain Onset: More than a month ago Pain Frequency: Constant     Diabetes: Yes CBG done?: No Did pt. bring in CBG monitor from home?: No  How often do you need to have someone help you when you read instructions, pamphlets, or other written materials from your doctor or pharmacy?: 1 - Never  Interpreter Needed?: No  Information entered by :: Mliss Graff LPN   Activities of Daily Living    08/20/2023   11:36 AM  In your present state of health, do you have any difficulty performing the following activities:  Hearing? 0  Vision? 1  Difficulty concentrating or making decisions? 1  Walking or climbing stairs? 1  Dressing or bathing? 0  Doing errands, shopping? 1  Preparing Food and eating ? N  Using the Toilet? N  In the past six months, have you accidently leaked urine? N  Do you have problems with loss of bowel control? N  Managing your Medications? N  Managing your Finances? N  Housekeeping or managing your Housekeeping? N    Patient Care Team: Candise Aleene DEL, MD as PCP - Diedre Louis Shove, MD as Consulting Physician (Neurosurgery) Darlis Deatrice RAMAN, MD as Consulting Physician (Pain Medicine)  Indicate any recent Medical Services you may have received  from other than [facility] providers in the past year (date may be approximate).     Assessment:   This is a routine wellness examination for [place].  Hearing/Vision screen Hearing Screening - Comments:: No trouble hearing Vision Screening - Comments:: Not up to date Education provided   Goals Addressed             This Visit's Progress    Patient Stated       Be happy       Depression Screen    08/20/2023   11:42 AM 07/02/2023   10:48 AM 12/06/2022   10:57 AM 07/03/2022    2:56 PM 06/24/2022    3:26 PM 05/09/2021    4:11 PM 04/18/2021    3:11 PM  PHQ 2/9 Scores  PHQ - 2 Score 6 5 6 4 6 4 4   PHQ- 9 Score 14 25 18 12 23 22 10     Fall Risk    08/20/2023   11:35 AM 07/02/2023   10:49 AM 12/06/2022   10:57 AM 07/03/2022    3:00 PM 06/24/2022    3:26 PM  Fall Risk   Falls in the past year? 1 1 1 1 1   Number falls in past yr: 1 1 0 1 1  Injury with Fall? 1 1  0 0  Risk for fall due to : Impaired balance/gait;History of fall(s) No Fall Risks  Impaired vision;Impaired balance/gait History of fall(s)  Follow up Falls evaluation completed;Education provided;Falls prevention discussed Falls evaluation completed Falls evaluation  completed Falls prevention discussed Falls evaluation completed    MEDICARE RISK AT HOME:    TIMED UP AND GO:  Was the test performed?  No    Cognitive Function:        08/20/2023   11:38 AM 07/03/2022    3:01 PM 04/18/2021    3:22 PM  6CIT Screen  What Year? 0 points 0 points 0 points  What month? 0 points 0 points 0 points  What time? 0 points 0 points 3 points  Count back from 20 0 points 0 points 0 points  Months in reverse 0 points 0 points 0 points  Repeat phrase 0 points 0 points 2 points  Total Score 0 points 0 points 5 points    Immunizations Immunization History  Administered Date(s) Administered   Fluad Trivalent(High Dose 65+) 12/06/2022   Influenza Split 11/02/2010   Influenza Whole 02/11/2010   Influenza,inj,Quad PF,6+ Mos  11/19/2012, 10/20/2014, 11/10/2015, 01/01/2017, 11/30/2021   Influenza,inj,quad, With Preservative 01/16/2018   Janssen (J&J) SARS-COV-2 Vaccination 05/05/2019, 12/29/2019   PNEUMOCOCCAL CONJUGATE-20 06/24/2022   Pneumococcal Polysaccharide-23 11/10/2015   Td 02/12/2003   Tdap 06/16/2013   Zoster Recombinant(Shingrix) 06/15/2019, 09/11/2019    TDAP status: Due, Education has been provided regarding the importance of this vaccine. Advised may receive this vaccine at local pharmacy or Health Dept. Aware to provide a copy of the vaccination record if obtained from local pharmacy or Health Dept. Verbalized acceptance and understanding.  Flu Vaccine status: Up to date  Pneumococcal vaccine status: Up to date  Covid-19 vaccine status: Information provided on how to obtain vaccines.   Qualifies for Shingles Vaccine? No   Zostavax completed Yes   Shingrix Completed?: Yes  Screening Tests Health Maintenance  Topic Date Due   Fecal DNA (Cologuard)  Never done   Diabetic kidney evaluation - Urine ACR  10/02/2011   OPHTHALMOLOGY EXAM  12/19/2013   COVID-19 Vaccine (3 - 2024-25 season) 10/13/2022   MAMMOGRAM  12/01/2022   DTaP/Tdap/Td (3 - Td or Tdap) 07/01/2024 (Originally 06/17/2023)   INFLUENZA VACCINE  09/12/2023   HEMOGLOBIN A1C  01/02/2024   Diabetic kidney evaluation - eGFR measurement  07/01/2024   FOOT EXAM  07/01/2024   Medicare Annual Wellness (AWV)  08/19/2024   Pneumococcal Vaccine: 50+ Years  Completed   DEXA SCAN  Completed   Hepatitis C Screening  Completed   Zoster Vaccines- Shingrix  Completed   Hepatitis B Vaccines  Aged Out   HPV VACCINES  Aged Out   Meningococcal B Vaccine  Aged Out    Health Maintenance  Health Maintenance Due  Topic Date Due   Fecal DNA (Cologuard)  Never done   Diabetic kidney evaluation - Urine ACR  10/02/2011   OPHTHALMOLOGY EXAM  12/19/2013   COVID-19 Vaccine (3 - 2024-25 season) 10/13/2022   MAMMOGRAM  12/01/2022    Colonoscopy    Education provided  Mammogram   Education provided  Bone Density status: Ordered  . Pt provided with contact info and advised to call to schedule appt.  Lung Cancer Screening: (Low Dose CT Chest recommended if Age 59-80 years, 20 pack-year currently smoking OR have quit w/in 15years.) does not qualify.   Lung Cancer Screening Referral:   Additional Screening:  Hepatitis C Screening: does not qualify; Completed 2019  Vision Screening: Recommended annual ophthalmology exams for early detection of glaucoma and other disorders of the eye. Is the patient up to date with their annual eye exam?  No  Who is  the provider or what is the name of the office in which the patient attends annual eye exams? Will Schedule If pt is not established with a provider, would they like to be referred to a provider to establish care? No .   Dental Screening: Recommended annual dental exams for proper oral hygiene  Nutrition Risk Assessment:  Has the patient had any N/V/D within the last 2 months?  No  Does the patient have any non-healing wounds?  No  Has the patient had any unintentional weight loss or weight gain?  No   Diabetes:  Is the patient diabetic?  Yes  If diabetic, was a CBG obtained today?  No  Did the patient bring in their glucometer from home?  No  How often do you monitor your CBG's? 4-5  times a day.   Financial Strains and Diabetes Management:  Are you having any financial strains with the device, your supplies or your medication? No .  Does the patient want to be seen by Chronic Care Management for management of their diabetes?  No  Would the patient like to be referred to a Nutritionist or for Diabetic Management?  No   Diabetic Exams:  Diabetic Eye Exam: . Overdue for diabetic eye exam. Pt has been advised about the importance in completing this exam.   Diabetic Foot Exam: . Pt has been advised about the importance in completing this exam.   Community Resource Referral /  Chronic Care Management: CRR required this visit?  No   CCM required this visit?  No     Plan:     I have personally reviewed and noted the following in the patient's chart:   Medical and social history Use of alcohol, tobacco or illicit drugs  Current medications and supplements including opioid prescriptions. Patient is currently taking opioid prescriptions. Information provided to patient regarding non-opioid alternatives. Patient advised to discuss non-opioid treatment plan with their provider. Functional ability and status Nutritional status Physical activity Advanced directives List of other physicians Hospitalizations, surgeries, and ER visits in previous 12 months Vitals Screenings to include cognitive, depression, and falls Referrals and appointments  In addition, I have reviewed and discussed with patient certain preventive protocols, quality metrics, and best practice recommendations. A written personalized care plan for preventive services as well as general preventive health recommendations were provided to patient.     Mliss Graff, LPN   2/0/7974   After Visit Summary: (MyChart) Due to this being a telephonic visit, the after visit summary with patients personalized plan was offered to patient via MyChart   Nurse Notes:

## 2023-08-20 NOTE — Telephone Encounter (Signed)
 RF request for adderall LOV: 07/02/23 Next ov: 01/02/24 Last written: 07/02/23 (60,0) CSC/UDS 07/02/23

## 2023-08-20 NOTE — Telephone Encounter (Signed)
 LVM for pt regarding CGM

## 2023-08-20 NOTE — Patient Instructions (Signed)
 Diana [name] , Thank you for taking time to come for your Medicare Wellness Visit. I appreciate your ongoing commitment to your health goals. Please review the following plan we discussed and let me know if I can assist you in the future.   Screening recommendations/referrals: Colonoscopy: Education provided Mammogram: Education provided Bone Density: ordered    Education provided Recommended yearly ophthalmology/optometry visit for glaucoma screening and checkup Recommended yearly dental visit for hygiene and checkup  Vaccinations: Influenza vaccine: up to date Pneumococcal vaccine: up to date Tdap vaccine: education provided Shingles vaccine: up to date       Preventive Care 65 Years and Older, Female Preventive care refers to lifestyle choices and visits with your health care provider that can promote health and wellness. What does preventive care include? A yearly physical exam. This is also called an annual well check. Dental exams once or twice a year. Routine eye exams. Ask your health care provider how often you should have your eyes checked. Personal lifestyle choices, including: Daily care of your teeth and gums. Regular physical activity. Eating a healthy diet. Avoiding tobacco and drug use. Limiting alcohol use. Practicing safe sex. Taking low-dose aspirin every day. Taking vitamin and mineral supplements as recommended by your health care provider. What happens during an annual well check? The services and screenings done by your health care provider during your annual well check will depend on your age, overall health, lifestyle risk factors, and family history of disease. Counseling  Your health care provider may ask you questions about your: Alcohol use. Tobacco use. Drug use. Emotional well-being. Home and relationship well-being. Sexual activity. Eating habits. History of falls. Memory and ability to understand (cognition). Work and work  Astronomer. Reproductive health. Screening  You may have the following tests or measurements: Height, weight, and BMI. Blood pressure. Lipid and cholesterol levels. These may be checked every 5 years, or more frequently if you are over 75 years old. Skin check. Lung cancer screening. You may have this screening every year starting at age 32 if you have a 30-pack-year history of smoking and currently smoke or have quit within the past 15 years. Fecal occult blood test (FOBT) of the stool. You may have this test every year starting at age 16. Flexible sigmoidoscopy or colonoscopy. You may have a sigmoidoscopy every 5 years or a colonoscopy every 10 years starting at age 47. Hepatitis C blood test. Hepatitis B blood test. Sexually transmitted disease (STD) testing. Diabetes screening. This is done by checking your blood sugar (glucose) after you have not eaten for a while (fasting). You may have this done every 1-3 years. Bone density scan. This is done to screen for osteoporosis. You may have this done starting at age 45. Mammogram. This may be done every 1-2 years. Talk to your health care provider about how often you should have regular mammograms. Talk with your health care provider about your test results, treatment options, and if necessary, the need for more tests. Vaccines  Your health care provider may recommend certain vaccines, such as: Influenza vaccine. This is recommended every year. Tetanus, diphtheria, and acellular pertussis (Tdap, Td) vaccine. You may need a Td booster every 10 years. Zoster vaccine. You may need this after age 81. Pneumococcal 13-valent conjugate (PCV13) vaccine. One dose is recommended after age 21. Pneumococcal polysaccharide (PPSV23) vaccine. One dose is recommended after age 75. Talk to your health care provider about which screenings and vaccines you need and how often you need them.  This information is not intended to replace advice given to you by  your health care provider. Make sure you discuss any questions you have with your health care provider. Document Released: 02/24/2015 Document Revised: 10/18/2015 Document Reviewed: 11/29/2014 Elsevier Interactive Patient Education  2017 [employer].  Fall Prevention in the Home Falls can cause injuries. They can happen to people of all ages. There are many things you can do to make your home safe and to help prevent falls. What can I do on the outside of my home? Regularly fix the edges of walkways and driveways and fix any cracks. Remove anything that might make you trip as you walk through a door, such as a raised step or threshold. Trim any bushes or trees on the path to your home. Use bright outdoor lighting. Clear any walking paths of anything that might make someone trip, such as rocks or tools. Regularly check to see if handrails are loose or broken. Make sure that both sides of any steps have handrails. Any raised decks and porches should have guardrails on the edges. Have any leaves, snow, or ice cleared regularly. Use sand or salt on walking paths during winter. Clean up any spills in your garage right away. This includes oil or grease spills. What can I do in the bathroom? Use night lights. Install grab bars by the toilet and in the tub and shower. Do not use towel bars as grab bars. Use non-skid mats or decals in the tub or shower. If you need to sit down in the shower, use a plastic, non-slip stool. Keep the floor dry. Clean up any water that spills on the floor as soon as it happens. Remove soap buildup in the tub or shower regularly. Attach bath mats securely with double-sided non-slip rug tape. Do not have throw rugs and other things on the floor that can make you trip. What can I do in the bedroom? Use night lights. Make sure that you have a light by your bed that is easy to reach. Do not use any sheets or blankets that are too big for your bed. They should not hang  down onto the floor. Have a firm chair that has side arms. You can use this for support while you get dressed. Do not have throw rugs and other things on the floor that can make you trip. What can I do in the kitchen? Clean up any spills right away. Avoid walking on wet floors. Keep items that you use a lot in easy-to-reach places. If you need to reach something above you, use a strong step stool that has a grab bar. Keep electrical cords out of the way. Do not use floor polish or wax that makes floors slippery. If you must use wax, use non-skid floor wax. Do not have throw rugs and other things on the floor that can make you trip. What can I do with my stairs? Do not leave any items on the stairs. Make sure that there are handrails on both sides of the stairs and use them. Fix handrails that are broken or loose. Make sure that handrails are as long as the stairways. Check any carpeting to make sure that it is firmly attached to the stairs. Fix any carpet that is loose or worn. Avoid having throw rugs at the top or bottom of the stairs. If you do have throw rugs, attach them to the floor with carpet tape. Make sure that you have a light switch at the  top of the stairs and the bottom of the stairs. If you do not have them, ask someone to add them for you. What else can I do to help prevent falls? Wear shoes that: Do not have high heels. Have rubber bottoms. Are comfortable and fit you well. Are closed at the toe. Do not wear sandals. If you use a stepladder: Make sure that it is fully opened. Do not climb a closed stepladder. Make sure that both sides of the stepladder are locked into place. Ask someone to hold it for you, if possible. Clearly mark and make sure that you can see: Any grab bars or handrails. First and last steps. Where the edge of each step is. Use tools that help you move around (mobility aids) if they are needed. These  include: Canes. Walkers. Scooters. Crutches. Turn on the lights when you go into a dark area. Replace any light bulbs as soon as they burn out. Set up your furniture so you have a clear path. Avoid moving your furniture around. If any of your floors are uneven, fix them. If there are any pets around you, be aware of where they are. Review your medicines with your doctor. Some medicines can make you feel dizzy. This can increase your chance of falling. Ask your doctor what other things that you can do to help prevent falls. This information is not intended to replace advice given to you by your health care provider. Make sure you discuss any questions you have with your health care provider. Document Released: 11/24/2008 Document Revised: 07/06/2015 Document Reviewed: 03/04/2014 Elsevier Interactive Patient Education  2017 [employer].

## 2023-08-21 NOTE — Telephone Encounter (Signed)
 Pt confirmed she did receive Freestyle Libre and advised we sent in Adderall rx.

## 2023-09-10 DIAGNOSIS — M5412 Radiculopathy, cervical region: Secondary | ICD-10-CM | POA: Diagnosis not present

## 2023-09-19 ENCOUNTER — Other Ambulatory Visit: Payer: Self-pay | Admitting: Family Medicine

## 2023-10-18 ENCOUNTER — Other Ambulatory Visit: Payer: Self-pay | Admitting: Family Medicine

## 2023-11-11 ENCOUNTER — Other Ambulatory Visit: Payer: Self-pay

## 2023-11-11 MED ORDER — AMPHETAMINE-DEXTROAMPHETAMINE 20 MG PO TABS: 20.0000 mg | ORAL_TABLET | Freq: Two times a day (BID) | ORAL | 0 refills | Status: DC

## 2023-11-11 NOTE — Telephone Encounter (Signed)
 Requesting: adderall  Contract: 07/02/23 UDS: 07/02/23 Last Visit: 07/02/23 Next Visit: 01/02/24 Last Refill: 08/20/23 (60,0)  Please Advise. Rx pending

## 2023-11-18 NOTE — Progress Notes (Signed)
 Jennell Janosik                                          MRN: [id number]   11/18/2023   The [facility] Quality Team Specialist reviewed this patient medical record for the purposes of chart review for care gap closure. The following were reviewed: chart review for care gap closure-kidney health evaluation for diabetes:eGFR  and uACR.    [facility] Quality Team

## 2023-11-19 DIAGNOSIS — G894 Chronic pain syndrome: Secondary | ICD-10-CM | POA: Diagnosis not present

## 2023-11-19 DIAGNOSIS — M5412 Radiculopathy, cervical region: Secondary | ICD-10-CM | POA: Diagnosis not present

## 2023-11-19 DIAGNOSIS — M19049 Primary osteoarthritis, unspecified hand: Secondary | ICD-10-CM | POA: Diagnosis not present

## 2023-11-19 DIAGNOSIS — Z9889 Other specified postprocedural states: Secondary | ICD-10-CM | POA: Diagnosis not present

## 2023-11-19 DIAGNOSIS — M62838 Other muscle spasm: Secondary | ICD-10-CM | POA: Diagnosis not present

## 2023-12-01 ENCOUNTER — Other Ambulatory Visit: Payer: Self-pay

## 2023-12-01 DIAGNOSIS — Z794 Long term (current) use of insulin: Secondary | ICD-10-CM

## 2023-12-01 MED ORDER — FREESTYLE LIBRE 3 PLUS SENSOR MISC: 0 refills | Status: DC

## 2023-12-01 MED ORDER — FREESTYLE LIBRE 3 READER DEVI: 0 refills | Status: AC

## 2023-12-03 ENCOUNTER — Other Ambulatory Visit (HOSPITAL_COMMUNITY): Payer: Self-pay

## 2023-12-03 ENCOUNTER — Telehealth: Payer: Self-pay

## 2023-12-03 NOTE — Telephone Encounter (Signed)
 Pharmacy Patient Advocate Encounter   Received notification from CoverMyMeds that prior authorization for FREESTYLE LIBRE 3 READER is required/requested.   Insurance verification completed.   The patient is insured through [facility].   Per test claim: PA required; PA submitted to above mentioned insurance via Latent Key/confirmation #/EOC [id number] Status is pending

## 2023-12-10 ENCOUNTER — Other Ambulatory Visit (HOSPITAL_COMMUNITY): Payer: Self-pay

## 2023-12-10 NOTE — Telephone Encounter (Signed)
 Pharmacy Patient Advocate Encounter  Received notification from [facility] MEDICARE that Prior Authorization for FREESTYLE LIBRE 3 READER is required/requested. has been CANCELLED, medication is not eligible for pharmacy benefits, medication must be billed through medical insurance. As our team currently only handles pharmacy related PA's, medical PA's must be submitted by the clinic.   PA #/Case ID/Reference #: [id number]

## 2023-12-11 ENCOUNTER — Other Ambulatory Visit: Payer: Self-pay | Admitting: Family Medicine

## 2023-12-12 ENCOUNTER — Other Ambulatory Visit (HOSPITAL_COMMUNITY): Payer: Self-pay

## 2023-12-16 ENCOUNTER — Other Ambulatory Visit: Payer: Self-pay | Admitting: Family Medicine

## 2023-12-16 DIAGNOSIS — E119 Type 2 diabetes mellitus without complications: Secondary | ICD-10-CM

## 2023-12-16 DIAGNOSIS — Z794 Long term (current) use of insulin: Secondary | ICD-10-CM

## 2023-12-28 ENCOUNTER — Other Ambulatory Visit: Payer: Self-pay | Admitting: Family Medicine

## 2023-12-30 ENCOUNTER — Other Ambulatory Visit: Payer: Self-pay

## 2023-12-30 MED ORDER — CYANOCOBALAMIN 1000 MCG/ML IJ SOLN: INTRAMUSCULAR | 4 refills | Status: AC

## 2023-12-30 MED ORDER — ATORVASTATIN CALCIUM 20 MG PO TABS: ORAL_TABLET | ORAL | 1 refills | Status: AC

## 2023-12-30 MED ORDER — METFORMIN HCL ER 500 MG PO TB24: ORAL_TABLET | ORAL | 1 refills | Status: AC

## 2023-12-30 MED ORDER — LAMOTRIGINE 100 MG PO TABS
100.0000 mg | ORAL_TABLET | Freq: Every day | ORAL | 1 refills | Status: AC
Start: 2023-12-30 — End: ?

## 2023-12-30 MED ORDER — MIRTAZAPINE 30 MG PO TABS: ORAL_TABLET | ORAL | 1 refills | Status: AC

## 2024-01-02 ENCOUNTER — Other Ambulatory Visit: Payer: Self-pay

## 2024-01-02 ENCOUNTER — Ambulatory Visit: Admitting: Family Medicine

## 2024-01-02 DIAGNOSIS — E119 Type 2 diabetes mellitus without complications: Secondary | ICD-10-CM

## 2024-01-02 MED ORDER — FREESTYLE LIBRE 3 PLUS SENSOR MISC: 2 refills | Status: AC

## 2024-01-02 NOTE — Telephone Encounter (Signed)
 Requesting: adderall Contract: 07/02/23 UDS: 07/02/23 Last Visit: 07/02/23 Next Visit: 02/18/24 Last Refill: 11/11/23 (60,0)  Please Advise. Rx pending

## 2024-01-04 MED ORDER — AMPHETAMINE-DEXTROAMPHETAMINE 20 MG PO TABS: 20.0000 mg | ORAL_TABLET | Freq: Two times a day (BID) | ORAL | 0 refills | Status: DC

## 2024-01-06 ENCOUNTER — Other Ambulatory Visit: Payer: Self-pay

## 2024-01-06 MED ORDER — CLONAZEPAM 2 MG PO TABS: ORAL_TABLET | ORAL | 5 refills | Status: AC

## 2024-01-06 NOTE — Telephone Encounter (Signed)
 Requesting: clonazepam  Contract: 07/02/23 UDS: 07/02/23 Last Visit: 07/02/23 Next Visit: 02/18/24 Last Refill: 07/02/23 (90,5)  Please Advise. Rx pending

## 2024-01-22 ENCOUNTER — Other Ambulatory Visit: Payer: Self-pay

## 2024-01-22 DIAGNOSIS — E119 Type 2 diabetes mellitus without complications: Secondary | ICD-10-CM

## 2024-01-22 MED ORDER — TRESIBA FLEXTOUCH 100 UNIT/ML ~~LOC~~ SOPN: PEN_INJECTOR | SUBCUTANEOUS | 0 refills | Status: DC

## 2024-02-02 ENCOUNTER — Other Ambulatory Visit: Payer: Self-pay | Admitting: Family Medicine

## 2024-02-03 NOTE — Telephone Encounter (Signed)
 Requesting: adderall Contract: 07/02/23 UDS: 07/02/23 Last Visit: 07/02/23 Next Visit: 02/18/24 Last Refill: 01/04/24 (60,0)   Please Advise. Rx pending

## 2024-02-18 ENCOUNTER — Ambulatory Visit: Admitting: Family Medicine

## 2024-02-18 ENCOUNTER — Encounter: Payer: Self-pay | Admitting: Family Medicine

## 2024-02-18 ENCOUNTER — Telehealth: Payer: Self-pay

## 2024-02-18 VITALS — BP 124/83 | HR 101 | Temp 96.5°F | Ht 66.0 in | Wt 181.6 lb

## 2024-02-18 DIAGNOSIS — F988 Other specified behavioral and emotional disorders with onset usually occurring in childhood and adolescence: Secondary | ICD-10-CM | POA: Diagnosis not present

## 2024-02-18 DIAGNOSIS — F3181 Bipolar II disorder: Secondary | ICD-10-CM | POA: Diagnosis not present

## 2024-02-18 DIAGNOSIS — E119 Type 2 diabetes mellitus without complications: Secondary | ICD-10-CM | POA: Diagnosis not present

## 2024-02-18 DIAGNOSIS — Z79899 Other long term (current) drug therapy: Secondary | ICD-10-CM

## 2024-02-18 DIAGNOSIS — Z794 Long term (current) use of insulin: Secondary | ICD-10-CM

## 2024-02-18 DIAGNOSIS — F411 Generalized anxiety disorder: Secondary | ICD-10-CM | POA: Diagnosis not present

## 2024-02-18 DIAGNOSIS — Z7984 Long term (current) use of oral hypoglycemic drugs: Secondary | ICD-10-CM | POA: Diagnosis not present

## 2024-02-18 DIAGNOSIS — Z23 Encounter for immunization: Secondary | ICD-10-CM

## 2024-02-18 LAB — MICROALBUMIN / CREATININE URINE RATIO
Creatinine,U: 210.5 mg/dL
Microalb Creat Ratio: 27.9 mg/g (ref 0.0–30.0)
Microalb, Ur: 5.9 mg/dL — ABNORMAL HIGH (ref 0.7–1.9)

## 2024-02-18 LAB — POCT GLYCOSYLATED HEMOGLOBIN (HGB A1C)
HbA1c POC (<> result, manual entry): 7.1 %
HbA1c, POC (controlled diabetic range): 7.1 % — AB (ref 0.0–7.0)
HbA1c, POC (prediabetic range): 7.1 % — AB (ref 5.7–6.4)
Hemoglobin A1C: 7.1 % — AB (ref 4.0–5.6)

## 2024-02-18 LAB — BASIC METABOLIC PANEL WITH GFR
BUN: 21 mg/dL (ref 6–23)
CO2: 33 meq/L — ABNORMAL HIGH (ref 19–32)
Calcium: 9.2 mg/dL (ref 8.4–10.5)
Chloride: 101 meq/L (ref 96–112)
Creatinine, Ser: 0.92 mg/dL (ref 0.40–1.20)
GFR: 64.61 mL/min
Glucose, Bld: 128 mg/dL — ABNORMAL HIGH (ref 70–99)
Potassium: 4.2 meq/L (ref 3.5–5.1)
Sodium: 140 meq/L (ref 135–145)

## 2024-02-18 MED ORDER — ONDANSETRON 4 MG PO TBDP: 4.0000 mg | ORAL_TABLET | Freq: Three times a day (TID) | ORAL | 1 refills | Status: AC | PRN

## 2024-02-18 MED ORDER — NOVOLOG FLEXPEN 100 UNIT/ML ~~LOC~~ SOPN: PEN_INJECTOR | SUBCUTANEOUS | 3 refills | Status: AC

## 2024-02-18 MED ORDER — TIRZEPATIDE 2.5 MG/0.5ML ~~LOC~~ SOAJ: 2.5000 mg | SUBCUTANEOUS | 0 refills | Status: AC

## 2024-02-18 NOTE — Telephone Encounter (Signed)
 Pt forgot to ask about dissolvable Zofran  tabs during appt.  Please fill, if appropriate.

## 2024-02-18 NOTE — Progress Notes (Signed)
 OFFICE VISIT  02/18/2024  CC:  Chief Complaint  Patient presents with   Medical Management of Chronic Issues    Pt is fasting; req dissolvable tablet for Zofran , has concern about Mirtazapine  causing hunger    Patient is a 69 y.o. female who presents accompanied by her husband for 6 mo f/u diabetes, mood disorder, adult ADD. A/P as of last visit: #1 Health maintenance exam: Reviewed age and gender appropriate health maintenance issues (prudent diet, regular exercise, health risks of tobacco and excessive alcohol, use of seatbelts, fire alarms in home, use of sunscreen).  Also reviewed age and gender appropriate health screening as well as vaccine recommendations. Vaccines:  UTD. Labs: Health panel Cervical ca screening: N/A.  Patient with history of total hysterectomy for benign diagnosis. Breast ca screening: Normal mammogram October 2023. -->ordered today. Colon ca screening: She did not turn in Cologuard from last year--->ordered again today.   2 diabetes without complication. Poor control.  Hba1c today is 8.0%. Due to relatively frequent morning hypoglycemia we will decrease Tresiba  to 46 units daily and she will try to get more aggressive with mealtime NovoLog  10- 20 units before every meal as well as metformin  1000 twice daily. Hba1c and metabolic panel today.   3 GAD with severe agoraphobia. Continue duloxetine  30 mg daily and clonazepam  2 mg 3 times daily. Controlled substance contract and UDS up-to-date.   #4 Mood d/o (hx ?bipolar) and adult ADD. Cont lamictal  100mg  every day. Cont Adderall 20 mg twice a day and Remeron  30 mg nightly.   #5 HLD, cont atorva 20 every day, check lipids today.   #6 Vit D, B12, iron deficiency d/t malabsorption s/p remote gastric bypass surgery. Check levels today.   #7 arthritis. Severe and CMCs, has had steroid injections by pain management physician before. Will check bilateral hand x-rays and bilateral knee x-rays.  INTERIM HX: Mood  and anxiety levels all stable.  Glucose is variable. She is using between 10 and 20 units of NovoLog  with each meal, following sliding scale. She takes 46 units of Tresiba  daily and metformin  1000 mg twice a day. She wants to start Mounjaro .     PMP AWARE reviewed today: most recent rx for Adderall was filled 02/03/2024, # 60, rx by me.  Most recent clonazepam  prescription was filled 01/07/2024, #90, prescription by me. Her opioid is prescribed by Dr. Deatrice Manus. No red flags.  Past Medical History:  Diagnosis Date   Abnormal liver diagnostic imaging 05/20/2017   CT abd->cirrhosis with atypical lesions that MRI f/u determined to be focal regions of severe steatosis.   Abnormal mammogram 12/2014   F/u diagnostic mammo and u/s NORMAL--resume annual screening mammography   Arthritis    Bipolar disorder (HCC)    questionable   Cervical radiculopathy    Post cerv fusion C4-5, C5-6--stable as of 06/2019 NS f/u.   Chronic fatigue    Chronic nausea 2012/13   Normal upper GI  12/2011 (mild GERD noted +small hiatal hernia)+   Chronic pain syndrome    Chronic C spine pain with radiculopathy.  Dr. Rosella at [facility] was her pain mgmt MD and he discharged her from [facility] pain mgmt center b/c she failed a UDS (+marijuana) 04/27/15. Dr. Malcolm referred her to pain mgmt 2021.   Chronic renal insufficiency, stage II (mild) 02/2015   GFR 70s   Colon cancer screening 03/06/2011   iFob 11/2010 through her insurer was negative. iFOB 01/2015 NEG.   Depression    Diabetes mellitus  urine protein-Cr ratio nl 09/2010, D.R. Screen neg 10/2009   Fibromyalgia    Fracture of rib of left side 08/14/2015   5th and 6th   Hepatic steatosis 2005   ultrasound.  Also, 07/2017 MRI abd--> plus a few focal areas of more severe steatosis.  Stable fatty liver on ultrasound 05/2021.   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  Spleen normal 2019.  Stable fatty liver on ultrasound 05/2021   Nephrolithiasis 11/2012   Neurosis, anxiety, panic type    Obesity    s/p bariatric surgery   Osteopenia 12/2010   2012 Hip T score -2.  09/2020 T score -2.2   Other B-complex deficiencies    Panic attacks    Restless legs syndrome    Syncopal episodes    7/73/17 in the setting of dehydration and sedative medication   Tobacco dependence    Vitamin B12 deficiency    Vitamin D  deficiency 09/2009   Wears dentures    uppers and lowers    Past Surgical History:  Procedure Laterality Date   ABDOMINAL HYSTERECTOMY     fibroids, ovaries remain   APPENDECTOMY     BACK SURGERY     BLADDER SURGERY     bladder tack   CERVICAL SPINE SURGERY  09/2009; 7985;7981   2014-ant cerv decomp (Dr. Gaither). 2018 fusion (Dr. Louis)   CHOLECYSTECTOMY  04/2010   w/lysis of adhesions (open procedure)--path showed chronic cholecystitis and cholesterol polyp.   CT CERVICAL SPINE W CONTRAST  ([facility] HX)  06/2019   No evidence of hardware complication or loosening of her instrumentation.   CYSTOSCOPY/RETROGRADE/URETEROSCOPY/STONE EXTRACTION WITH BASKET Left 11/21/2012   Left UVJ stone.  Procedure: CYSTOSCOPY/RETROGRADE/URETEROSCOPY/STONE EXTRACTION WITH BASKET insertion double j stent;  Surgeon: Arlena LILLETTE Gal, MD;  Location: [facility] ORS;  Service: Urology;  Laterality: Left;   DEXA  09/2020   T score -2.2. Rpt 2 yrs.   EPIDURAL BLOCK INJECTION  03/2011   Cervical   HERNIA REPAIR  2005   mesenteric hernia repair, with lysis of adhesions (presented with SBO)   POSTERIOR CERVICAL FUSION/FORAMINOTOMY N/A 12/31/2016   Procedure: Posterior Cervical Fusion with lateral mass fixation - Cervical four-five,  Cervical five-six;  Surgeon: Louis Shove, MD;  Location: [facility] OR;  Service: Neurosurgery;  Laterality: N/A;   ROUX-EN-Y GASTRIC BYPASS     TONSILLECTOMY      Outpatient Medications Prior to Visit  Medication Sig Dispense Refill   amphetamine -dextroamphetamine  (ADDERALL) 20 MG tablet Take 1 tablet by mouth 2 (two) times daily. 60 tablet 0   atorvastatin  (LIPITOR) 20 MG tablet TAKE (1) TABLET BY MOUTH ONCE DAILY. 90 tablet 1   calcium  elemental as carbonate (TUMS ULTRA 1000) 400 MG chewable tablet Chew 1,000 mg by mouth daily.     Cholecalciferol  2000 UNITS CAPS Take 1 capsule (2,000 Units total) by mouth daily. 30 each 11   clonazePAM  (KLONOPIN ) 2 MG tablet TAKE (1) TABLET BY MOUTH (3) TIMES DAILY. 90 tablet 5   Continuous Glucose Receiver (FREESTYLE LIBRE 3 READER) DEVI USE TO CHECK GLUCOSE. 2 each 0   Continuous Glucose Sensor (FREESTYLE LIBRE 3 PLUS SENSOR) MISC Change  sensor every 15 days. 3 each 2   cyanocobalamin  (VITAMIN B12) 1000 MCG/ML injection INJECT INTO THE MUSCLE EVERY 14 DAYS. 4 mL 4   cyclobenzaprine  (FLEXERIL ) 5 MG tablet Take 5 mg by mouth 2 (two) times daily as needed.     DULoxetine  (CYMBALTA ) 30 MG capsule TAKE (1) CAPSULE BY MOUTH ONCE DAILY. 90 capsule 0   EMBECTA PEN NEEDLE ULTRAFINE 31G X 5 MM MISC USE AS DIRECTED TO INJECT INSULIN  UP TO 5 TIMES DAILY. 200 each 1   gabapentin  (NEURONTIN ) 300 MG capsule TAKE 4 CAPSULES BY MOUTH AT BEDTIME. 360 capsule 0   glucose blood (FREESTYLE PRECISION NEO TEST) test strip Use as instructed 100 each 2   insulin  degludec (TRESIBA  FLEXTOUCH) 100 UNIT/ML FlexTouch Pen INJECT 70 UNITS SUBCUTANEOUSLY ONCE DAILY. 15 mL 0   lamoTRIgine  (LAMICTAL ) 100 MG tablet Take 1 tablet (100 mg total) by mouth daily. 90 tablet 1   metFORMIN  (GLUCOPHAGE -XR) 500 MG 24 hr tablet TAKE (2) TABLET BY MOUTH ONCE DAILY WITH BREAKFAST. 180 tablet 1   mirtazapine  (REMERON ) 30 MG tablet TAKE (1) TABLET BY MOUTH AT BEDTIME. 90 tablet 1   ondansetron  (ZOFRAN ) 4  MG tablet TAKE 1 TABLET BY MOUTH EVERY 6 HOURS AS NEEDED FOR NAUSEA. 60 tablet 1   oxyCODONE -acetaminophen  (PERCOCET) 10-325 MG tablet Take 1 tablet by mouth every 8 (eight) hours as needed.     insulin  aspart (NOVOLOG  FLEXPEN) 100 UNIT/ML FlexPen INJECT 10 UNITS UNDER THE SKIN BEFORE MEALS. 15 mL 3   FERROUS SULFATE PO Take by mouth. (Patient not taking: Reported on 02/18/2024)     HYDROcodone -acetaminophen  (NORCO) 10-325 MG tablet Take 1 tablet by mouth 3 (three) times daily as needed. (Patient not taking: Reported on 02/18/2024)     No facility-administered medications prior to visit.    Allergies[1]  Review of Systems As per HPI  PE:    02/18/2024   10:22 AM 08/20/2023   11:33 AM 07/02/2023   11:08 AM  Vitals with BMI  Height 5' 6 5' 6   Weight 181 lbs 10 oz 183 lbs   BMI 29.32 29.55   Systolic 124  134  Diastolic 83  70  Pulse 101       Physical Exam  Gen: Alert, well appearing.  Patient is oriented to person, place, time, and situation. AFFECT: pleasant, lucid thought and speech. EXT: no edema  LABS:  Last CBC Lab Results  Component Value Date   WBC 7.2 07/02/2023   HGB 12.9 07/02/2023   HCT 38.7 07/02/2023   MCV 92.8 07/02/2023   MCH 31.0 05/22/2017   RDW 13.6 07/02/2023   PLT 266.0 07/02/2023   Last metabolic panel Lab Results  Component Value Date   GLUCOSE 203 (H) 07/02/2023   NA 139 07/02/2023   K 5.4 No hemolysis seen (H) 07/02/2023   CL 100 07/02/2023   CO2 31 07/02/2023   BUN 16 07/02/2023   CREATININE 0.95 07/02/2023   GFR 62.45 07/02/2023   CALCIUM  9.5 07/02/2023   PROT 7.6 07/02/2023   ALBUMIN 4.5 07/02/2023   BILITOT 0.4 07/02/2023   ALKPHOS 103 07/02/2023   AST 35 07/02/2023   ALT 28 07/02/2023   ANIONGAP 9 05/22/2017   Last lipids Lab Results  Component Value Date   CHOL 151 07/02/2023   HDL 49.30 07/02/2023   LDLCALC 82 07/02/2023   LDLDIRECT 174.0 08/08/2015   TRIG 100.0 07/02/2023   CHOLHDL 3 07/02/2023   Last hemoglobin  A1c Lab  Results  Component Value Date   HGBA1C 7.1 (A) 02/18/2024   HGBA1C 7.1 02/18/2024   HGBA1C 7.1 (A) 02/18/2024   HGBA1C 7.1 (A) 02/18/2024   Last thyroid  functions Lab Results  Component Value Date   TSH 4.71 06/24/2022   T3TOTAL 88.2 10/11/2016   FREET4 0.96 10/11/2016   Last vitamin D  Lab Results  Component Value Date   VD25OH 51.61 07/02/2023   Last vitamin B12 and Folate Lab Results  Component Value Date   VITAMINB12 907 07/02/2023   Lab Results  Component Value Date   IRON 70 07/02/2023   TIBC 331 07/02/2023   FERRITIN 44 07/02/2023   IMPRESSION AND PLAN:  1 Diabetes without complication. Poor control but improving--->A1c today is 7.1%. Due to relatively frequent morning hypoglycemia we will decrease continue Tresiba  46 units daily and NovoLog  10 to 20 units with each meal.  Continue metformin  1000 mg twice daily. Start Mounjaro  2.5 mg weekly. Therapeutic expectations and side effect profile of medication discussed today.  Patient's questions answered.   2 GAD with severe agoraphobia. Continue duloxetine  30 mg daily and clonazepam  2 mg 3 times daily. Controlled substance contract and UDS up-to-date.   #3 Mood d/o (hx ?bipolar) and adult ADD. Cont lamictal  100mg  every day. Cont Adderall 20 mg twice a day and Remeron  30 mg nightly  An After Visit Summary was printed and given to the patient.  FOLLOW UP: Return in about 4 weeks (around 03/17/2024) for f/u wt mgmt. Next CPE May 2026 Signed:  Gerlene Hockey, MD           02/18/2024     [1]  Allergies Allergen Reactions   Aspirin Other (See Comments)    Gastric Bypass   Latex Other (See Comments)    blisters   Penicillins     UNSPECIFIED REACTION >Took large quantities as a child & told to never take again Has patient had a PCN reaction causing immediate rash, facial/tongue/throat swelling, SOB or lightheadedness with hypotension: No Has patient had a PCN reaction causing severe rash involving mucus  membranes or skin necrosis: No Has patient had a PCN reaction that required hospitalization: No Has patient had a PCN reaction occurring within the last 10 years: No If all of the above answers are NO, then may proceed wi   Nickel    Codeine Itching

## 2024-02-18 NOTE — Patient Instructions (Signed)
"  ° °  It was very nice to see you today! Your A1c was 7.1, no changes recommended.  PLEASE NOTE:  If labs were collected or images ordered, we will inform you of  results once we have received them and reviewed. We will contact you either by echart message, or telephone call.  Please give ample time to the testing facility, and our office to run,  receive and review results. Please do not call inquiring of results, even if you can see them in your chart. We will contact you as soon as we are able. If it has been over 1 week since the test was completed, and you have not yet heard from us , then please call us .     - echart message- for normal results that have been seen by the patient already.   - telephone call: abnormal results or if patient has not viewed results in their echart.   If a referral to a specialist was entered for you, please call us  in 2 weeks if you have not heard from the specialist office to schedule.     "

## 2024-02-19 ENCOUNTER — Ambulatory Visit: Payer: Self-pay

## 2024-02-19 ENCOUNTER — Ambulatory Visit: Payer: Self-pay | Admitting: Family Medicine

## 2024-02-19 NOTE — Telephone Encounter (Signed)
 FYI Only or Action Required?: FYI only for provider: ED advised.  Patient was last seen in primary care on 02/18/2024 by McGowen, Aleene DEL, MD.  Called Nurse Triage reporting Influenza.  Symptoms began today.  Interventions attempted: Nothing.  Symptoms are: gradually worsening.  Triage Disposition: Go to ED Now (Notify PCP)  Patient/caregiver understands and will follow disposition?:   Copied from CRM [id number]. Topic: Clinical - Red Word Triage >> Feb 19, 2024  2:25 PM Victoria A wrote: [facility] that prompted transfer to Nurse Triage: Patient's husband called said patient had Flu Shot  yesterday, patient has fever,arm is hot to touch has body aches and headaches arm is swollen Reason for Disposition  Chest pain (Exception: Mild central chest pain, present only when coughing.)  Answer Assessment - Initial Assessment Questions 1. SYMPTOMS: What is your main symptom or concern? (e.g., cough, fever, shortness of breath, muscle aches)     Patient got flu shot yesterday and was out all day till 8pm and started not felling well. Arm is red and swollen. Now, arm is very swollen and arm pain, fever 102. Husband says she states difficulty breathing and chest pain, yes to emergent questions. He says he doesn't see her having difficulty breathing but she still says she is. Triage shortened due to symptoms.  Protocols used: Influenza (Flu) Suspected-A-AH

## 2024-02-19 NOTE — Telephone Encounter (Signed)
 Results given to pt

## 2024-02-19 NOTE — Telephone Encounter (Signed)
 ED advised per Triage

## 2024-03-03 ENCOUNTER — Other Ambulatory Visit: Payer: Self-pay | Admitting: Family Medicine

## 2024-03-03 DIAGNOSIS — E119 Type 2 diabetes mellitus without complications: Secondary | ICD-10-CM

## 2024-03-03 DIAGNOSIS — Z7984 Long term (current) use of oral hypoglycemic drugs: Secondary | ICD-10-CM

## 2024-03-04 NOTE — Telephone Encounter (Signed)
 RF sent for Tresiba .  Requesting: Adderall Contract: 07/02/23 UDS: 07/02/23 Last Visit: 02/18/24 Next Visit: 03/19/24 Last Refill:  Please Advise. Rx pending

## 2024-03-08 ENCOUNTER — Other Ambulatory Visit: Payer: Self-pay | Admitting: Family Medicine

## 2024-03-10 NOTE — Progress Notes (Signed)
 Diana [name]                                          MRN: [id number]   03/10/2024   The [facility] Quality Team Specialist reviewed this patient medical record for the purposes of chart review for care gap closure. The following were reviewed: chart review for care gap closure-diabetic eye exam.    [facility] Quality Team

## 2024-03-12 NOTE — Progress Notes (Signed)
 Diana [name]                                          MRN: [id number]   03/12/2024   The [facility] Quality Team Specialist reviewed this patient medical record for the purposes of chart review for care gap closure. The following were reviewed: chart review for care gap closure-kidney health evaluation for diabetes:eGFR  and uACR for 2025.    [facility] Quality Team

## 2024-03-19 ENCOUNTER — Ambulatory Visit: Admitting: Family Medicine

## 2024-08-25 ENCOUNTER — Encounter
